# Patient Record
Sex: Female | Born: 1970
Health system: Southern US, Community
[De-identification: ages and names within clinical notes are randomized; demographics above are authoritative.]

## PROBLEM LIST (undated history)

## (undated) DIAGNOSIS — M4316 Spondylolisthesis, lumbar region: Secondary | ICD-10-CM

## (undated) DIAGNOSIS — T7840XA Allergy, unspecified, initial encounter: Secondary | ICD-10-CM

## (undated) DIAGNOSIS — J189 Pneumonia, unspecified organism: Secondary | ICD-10-CM

## (undated) DIAGNOSIS — E559 Vitamin D deficiency, unspecified: Secondary | ICD-10-CM

## (undated) DIAGNOSIS — E669 Obesity, unspecified: Secondary | ICD-10-CM

## (undated) DIAGNOSIS — K829 Disease of gallbladder, unspecified: Secondary | ICD-10-CM

## (undated) DIAGNOSIS — G4733 Obstructive sleep apnea (adult) (pediatric): Secondary | ICD-10-CM

## (undated) DIAGNOSIS — M1990 Unspecified osteoarthritis, unspecified site: Secondary | ICD-10-CM

## (undated) DIAGNOSIS — K219 Gastro-esophageal reflux disease without esophagitis: Secondary | ICD-10-CM

## (undated) DIAGNOSIS — I10 Essential (primary) hypertension: Secondary | ICD-10-CM

## (undated) DIAGNOSIS — F329 Major depressive disorder, single episode, unspecified: Secondary | ICD-10-CM

## (undated) DIAGNOSIS — F32A Depression, unspecified: Secondary | ICD-10-CM

## (undated) DIAGNOSIS — F419 Anxiety disorder, unspecified: Secondary | ICD-10-CM

## (undated) DIAGNOSIS — G43909 Migraine, unspecified, not intractable, without status migrainosus: Secondary | ICD-10-CM

## (undated) DIAGNOSIS — G4730 Sleep apnea, unspecified: Secondary | ICD-10-CM

## (undated) HISTORY — DX: Allergy, unspecified, initial encounter: T78.40XA

## (undated) HISTORY — PX: CHOLECYSTECTOMY: SHX55

## (undated) HISTORY — DX: Anxiety disorder, unspecified: F41.9

## (undated) HISTORY — DX: Obstructive sleep apnea (adult) (pediatric): G47.33

## (undated) HISTORY — DX: Unspecified osteoarthritis, unspecified site: M19.90

## (undated) HISTORY — DX: Essential (primary) hypertension: I10

## (undated) HISTORY — DX: Vitamin D deficiency, unspecified: E55.9

## (undated) HISTORY — DX: Sleep apnea, unspecified: G47.30

## (undated) HISTORY — PX: SLEEVE GASTROPLASTY: SHX1101

## (undated) HISTORY — DX: Depression, unspecified: F32.A

## (undated) HISTORY — PX: TUBAL LIGATION: SHX77

## (undated) HISTORY — DX: Gastro-esophageal reflux disease without esophagitis: K21.9

## (undated) HISTORY — DX: Obesity, unspecified: E66.9

## (undated) HISTORY — DX: Major depressive disorder, single episode, unspecified: F32.9

## (undated) HISTORY — DX: Disease of gallbladder, unspecified: K82.9

---

## 1998-01-28 ENCOUNTER — Inpatient Hospital Stay (HOSPITAL_COMMUNITY): Admission: AD | Admit: 1998-01-28 | Discharge: 1998-01-28 | Payer: Self-pay | Admitting: Obstetrics

## 1998-02-24 ENCOUNTER — Inpatient Hospital Stay (HOSPITAL_COMMUNITY): Admission: AD | Admit: 1998-02-24 | Discharge: 1998-02-24 | Payer: Self-pay | Admitting: Obstetrics

## 1998-03-11 ENCOUNTER — Observation Stay (HOSPITAL_COMMUNITY): Admission: AD | Admit: 1998-03-11 | Discharge: 1998-03-11 | Payer: Self-pay | Admitting: Obstetrics

## 1998-03-20 ENCOUNTER — Inpatient Hospital Stay (HOSPITAL_COMMUNITY): Admission: AD | Admit: 1998-03-20 | Discharge: 1998-03-20 | Payer: Self-pay | Admitting: Obstetrics

## 1998-03-21 ENCOUNTER — Inpatient Hospital Stay (HOSPITAL_COMMUNITY): Admission: AD | Admit: 1998-03-21 | Discharge: 1998-03-23 | Payer: Self-pay | Admitting: Obstetrics

## 1999-02-15 ENCOUNTER — Encounter: Payer: Self-pay | Admitting: Emergency Medicine

## 1999-02-15 ENCOUNTER — Emergency Department (HOSPITAL_COMMUNITY): Admission: EM | Admit: 1999-02-15 | Discharge: 1999-02-15 | Payer: Self-pay | Admitting: Emergency Medicine

## 1999-06-28 ENCOUNTER — Emergency Department (HOSPITAL_COMMUNITY): Admission: EM | Admit: 1999-06-28 | Discharge: 1999-06-28 | Payer: Self-pay | Admitting: Emergency Medicine

## 1999-08-12 ENCOUNTER — Emergency Department (HOSPITAL_COMMUNITY): Admission: EM | Admit: 1999-08-12 | Discharge: 1999-08-12 | Payer: Self-pay | Admitting: *Deleted

## 2000-12-29 ENCOUNTER — Other Ambulatory Visit: Admission: RE | Admit: 2000-12-29 | Discharge: 2000-12-29 | Payer: Self-pay | Admitting: Obstetrics

## 2001-01-26 ENCOUNTER — Encounter (HOSPITAL_BASED_OUTPATIENT_CLINIC_OR_DEPARTMENT_OTHER): Payer: Self-pay | Admitting: General Surgery

## 2001-01-28 ENCOUNTER — Ambulatory Visit (HOSPITAL_COMMUNITY): Admission: RE | Admit: 2001-01-28 | Discharge: 2001-01-29 | Payer: Self-pay | Admitting: General Surgery

## 2001-01-28 ENCOUNTER — Encounter (HOSPITAL_BASED_OUTPATIENT_CLINIC_OR_DEPARTMENT_OTHER): Payer: Self-pay | Admitting: General Surgery

## 2001-01-28 ENCOUNTER — Encounter (INDEPENDENT_AMBULATORY_CARE_PROVIDER_SITE_OTHER): Payer: Self-pay | Admitting: *Deleted

## 2001-08-12 ENCOUNTER — Emergency Department (HOSPITAL_COMMUNITY): Admission: EM | Admit: 2001-08-12 | Discharge: 2001-08-12 | Payer: Self-pay | Admitting: *Deleted

## 2004-03-06 ENCOUNTER — Emergency Department (HOSPITAL_COMMUNITY): Admission: EM | Admit: 2004-03-06 | Discharge: 2004-03-07 | Payer: Self-pay | Admitting: Emergency Medicine

## 2005-03-26 ENCOUNTER — Emergency Department (HOSPITAL_COMMUNITY): Admission: EM | Admit: 2005-03-26 | Discharge: 2005-03-26 | Payer: Self-pay | Admitting: Emergency Medicine

## 2007-04-11 ENCOUNTER — Emergency Department (HOSPITAL_COMMUNITY): Admission: EM | Admit: 2007-04-11 | Discharge: 2007-04-11 | Payer: Self-pay | Admitting: Emergency Medicine

## 2007-05-01 ENCOUNTER — Emergency Department (HOSPITAL_COMMUNITY): Admission: EM | Admit: 2007-05-01 | Discharge: 2007-05-01 | Payer: Self-pay | Admitting: Emergency Medicine

## 2007-05-13 ENCOUNTER — Emergency Department (HOSPITAL_COMMUNITY): Admission: EM | Admit: 2007-05-13 | Discharge: 2007-05-13 | Payer: Self-pay | Admitting: Emergency Medicine

## 2007-09-15 ENCOUNTER — Emergency Department (HOSPITAL_COMMUNITY): Admission: EM | Admit: 2007-09-15 | Discharge: 2007-09-16 | Payer: Self-pay | Admitting: Emergency Medicine

## 2007-09-22 ENCOUNTER — Encounter: Admission: RE | Admit: 2007-09-22 | Discharge: 2007-12-21 | Payer: Self-pay | Admitting: Sports Medicine

## 2007-12-08 ENCOUNTER — Encounter: Admission: RE | Admit: 2007-12-08 | Discharge: 2007-12-08 | Payer: Self-pay | Admitting: Sports Medicine

## 2008-01-07 ENCOUNTER — Encounter: Admission: RE | Admit: 2008-01-07 | Discharge: 2008-01-07 | Payer: Self-pay | Admitting: Sports Medicine

## 2008-04-22 ENCOUNTER — Emergency Department (HOSPITAL_COMMUNITY): Admission: EM | Admit: 2008-04-22 | Discharge: 2008-04-22 | Payer: Self-pay | Admitting: Emergency Medicine

## 2008-08-15 ENCOUNTER — Emergency Department (HOSPITAL_COMMUNITY): Admission: EM | Admit: 2008-08-15 | Discharge: 2008-08-15 | Payer: Self-pay | Admitting: Emergency Medicine

## 2008-12-19 ENCOUNTER — Ambulatory Visit (HOSPITAL_BASED_OUTPATIENT_CLINIC_OR_DEPARTMENT_OTHER): Admission: RE | Admit: 2008-12-19 | Discharge: 2008-12-19 | Payer: Self-pay | Admitting: Dentistry

## 2008-12-23 ENCOUNTER — Ambulatory Visit: Payer: Self-pay | Admitting: Internal Medicine

## 2009-03-02 ENCOUNTER — Emergency Department (HOSPITAL_BASED_OUTPATIENT_CLINIC_OR_DEPARTMENT_OTHER): Admission: EM | Admit: 2009-03-02 | Discharge: 2009-03-02 | Payer: Self-pay | Admitting: Emergency Medicine

## 2011-04-15 NOTE — Procedures (Signed)
NAME:  Kim Watson, Kim Watson                ACCOUNT NO.:  [id number]   MEDICAL RECORD NO.:  [id number]          PATIENT TYPE:  OUT   LOCATION:  SLEEP CENTER                 FACILITY:  [facility]   PHYSICIAN:  Clinton D. Maple Hudson, MD, FCCP, FACPDATE OF BIRTH:  March 06, 1971   DATE OF STUDY:  12/19/2008                            NOCTURNAL POLYSOMNOGRAM   REFERRING PHYSICIAN:   INDICATION FOR STUDY:  Hypersomnia with sleep apnea.   EPWORTH SLEEPINESS SCORE:  17/24.  BMI 45.  Weight 280 pounds, height 66  inches, neck 16 inches.   MEDICATIONS:  None listed.   SLEEP ARCHITECTURE:  Split study protocol.  During the diagnostic phase  total sleep time was 194 minutes with sleep efficiency 77.5%.  Stage I  was 6.4%.  Stage II 77.4%.  Stage III 7.7%.  REM 8.5% of total sleep  time.  Sleep latency 51 minutes.  REM latency 94 minutes.  Awake after  sleep onset 5.5 minutes.  Arousal index 43.8 indicating increased EEG  arousal.  No bedtime medication was taken.   RESPIRATORY DATA:  Split study protocol.  Apnea-hypopnea index (AHI)  15.1 per hour.  A total of 49 events was scored, all as hypopneas.  Events were predominantly while supine.  REM AHI 7.3 per hour.  CPAP was  titrated to 6 CWP, AHI 0 per hour.  She chose a small ResMed Mirage  Quattro mask with heated humidifier.   OXYGEN DATA:  Moderate snoring with oxygen desaturation to a nadir of  87%.  Mean oxygen saturation after CPAP control was 97.1% on room air.   CARDIAC DATA:  Normal sinus rhythm.   MOVEMENT-PARASOMNIA:  No significant movement disturbance.  No bathroom  trips.   IMPRESSIONS-RECOMMENDATIONS:  1. Mild to moderate obstructive sleep apnea/hypopnea syndrome, AHI      15.1 per hour.  Events were all hypopneas, recorded mostly while      supine, with moderate snoring and oxygen desaturation to a nadir of      87%.  2. Successful CPAP titration to 6 CWP, AHI 0 per hour.  She chose a      small ResMed Mirage Quattro mask with heated  humidifier.  3. Excessive limb movement activity meeting criteria for periodic limb      movement was not seen; however, review of the video tape shows      frequent limb movement, which suggests that this patient may at      times experience sleep disturbance due to periodic limb      movement/restless legs.  If clinical history suggests that      pattern to be important as a cause of home sleep disruption then a      therapeutic trial of Requip or Mirapex might be worth considering.      Clinton D. Maple Hudson, MD, [facility], FACP  Diplomate, Biomedical engineer of Sleep Medicine  Electronically Signed     CDY/MEDQ  D:  12/23/2008 12:23:14  T:  12/24/2008 06:03:55  Job:  [id number]

## 2011-04-18 NOTE — Op Note (Signed)
[facility]  Patient:    Kim Watson, Kim Watson                         MRN: [id number] Proc. Date: 01/28/01 Adm. Date:  [id number] Attending:  Sonda Primes                           Operative Report  PREOPERATIVE DIAGNOSIS:  Chronic calculous cholecystitis.  POSTOPERATIVE DIAGNOSIS:  Chronic calculous cholecystitis.  PROCEDURE:  Laparoscopic cholecystectomy with intraoperative cholangiogram.  SURGEON:  Luisa Hart L. Lurene Shadow, M.D.  ASSISTANT:  Marnee Spring. Wiliam Ke, M.D.  ANESTHESIA:  General.  CLINICAL NOTE:  Patient is a 40 year old woman presenting with upper abdominal pain, nausea and vomiting, normal liver functions, no hyperamylasemia, gallbladder ultrasound showing cholelithiasis.  Brought to the operating room now for cholecystectomy.  DESCRIPTION OF PROCEDURE:  Following the induction of satisfactory general anesthesia, the patient was positioned supinely and the abdomen prepped and draped routinely.  Open laparoscopy of the umbilicus and the insertion of a Hasson cannula allowed insufflation of the peritoneal cavity to 14 mmHg pressure.  The camera was inserted and the abdomen visually explored.  Liver edges were sharp, liver surface was smooth.  The gallbladder was chronically scarred.  Anterior gastric wall appeared to be within normal limits.  None of the small or large intestine viewed appeared to be abnormal.  Under direct vision, epigastric and lateral ports were placed and gallbladder was grasped and retracted cephalad and dissection carried down near the ampulla with isolation of the cystic artery and the cystic duct.  The cystic artery was doubly clipped and transected.  The cystic duct was clipped proximally and opened.  A cystic duct cholangiogram was carried out using one-half strength Hypaque dye.  The resulting cholangiogram showed prompt and free flow through the cystic duct into the biliary system and into the duodenum.  No  filling defects were noted.  No biliary dilatation was noted. The cholangiocatheter was then removed, and the cystic duct was doubly clipped and transected.  The gallbladder was then dissected free from the liver bed, maintaining hemostasis along the course using electrocautery.  The liver bed was again inspected and noted to be dry.  The right upper quadrant was thoroughly irrigated with normal saline and aspirated.  Camera removed through the epigastric port and the gallbladder retrieved through the umbilical port without difficulty.  Sponge, instrument, and sharp counts verified, pneumoperitoneum deflated, and the wounds closed in layers as follows: Umbilical wound in two layers with 0 Dexon and 4-0 Dexon.  Epigastric and lateral flank wound with 4-0 Dexon.  All the wounds reinforced with Steri-Strips, and sterile dressings were applied.  Anesthetic reversed, patient removed from the operating room to the recovery room in stable condition, having tolerated the procedure well. DD:  01/28/01 TD:  01/28/01 Job: [id number]

## 2011-07-08 ENCOUNTER — Emergency Department (HOSPITAL_COMMUNITY)
Admission: EM | Admit: 2011-07-08 | Discharge: 2011-07-08 | Disposition: A | Discharge status: Home / Self Care | Payer: Medicaid Other | Attending: Emergency Medicine | Admitting: Emergency Medicine

## 2011-07-08 ENCOUNTER — Emergency Department (HOSPITAL_COMMUNITY): Payer: Medicaid Other

## 2011-07-08 DIAGNOSIS — I10 Essential (primary) hypertension: Secondary | ICD-10-CM | POA: Insufficient documentation

## 2011-07-08 DIAGNOSIS — R109 Unspecified abdominal pain: Secondary | ICD-10-CM | POA: Insufficient documentation

## 2011-07-08 DIAGNOSIS — R0602 Shortness of breath: Secondary | ICD-10-CM | POA: Insufficient documentation

## 2011-07-08 DIAGNOSIS — R51 Headache: Secondary | ICD-10-CM | POA: Insufficient documentation

## 2011-07-08 DIAGNOSIS — Z8614 Personal history of Methicillin resistant Staphylococcus aureus infection: Secondary | ICD-10-CM | POA: Insufficient documentation

## 2011-07-08 DIAGNOSIS — R10816 Epigastric abdominal tenderness: Secondary | ICD-10-CM | POA: Insufficient documentation

## 2011-07-08 DIAGNOSIS — R112 Nausea with vomiting, unspecified: Secondary | ICD-10-CM | POA: Insufficient documentation

## 2011-07-08 LAB — CBC
MCH: 27.1 pg (ref 26.0–34.0)
MCHC: 31.6 g/dL (ref 30.0–36.0)
MCV: 85.8 fL (ref 78.0–100.0)
Platelets: 286 10*3/uL (ref 150–400)
RBC: 5.01 MIL/uL (ref 3.87–5.11)
RDW: 14.2 % (ref 11.5–15.5)

## 2011-07-08 LAB — DIFFERENTIAL
Basophils Relative: 0 % (ref 0–1)
Eosinophils Absolute: 0.3 10*3/uL (ref 0.0–0.7)
Eosinophils Relative: 4 % (ref 0–5)
Monocytes Absolute: 0.5 10*3/uL (ref 0.1–1.0)
Monocytes Relative: 7 % (ref 3–12)
Neutrophils Relative %: 54 % (ref 43–77)

## 2011-07-08 LAB — URINALYSIS, ROUTINE W REFLEX MICROSCOPIC
Glucose, UA: NEGATIVE mg/dL
Ketones, ur: NEGATIVE mg/dL
Nitrite: NEGATIVE
pH: 6 (ref 5.0–8.0)

## 2011-07-08 LAB — COMPREHENSIVE METABOLIC PANEL
ALT: 19 U/L (ref 0–35)
AST: 17 U/L (ref 0–37)
Albumin: 3.8 g/dL (ref 3.5–5.2)
Calcium: 9.4 mg/dL (ref 8.4–10.5)
Creatinine, Ser: 0.66 mg/dL (ref 0.50–1.10)
Sodium: 139 mEq/L (ref 135–145)
Total Protein: 7.6 g/dL (ref 6.0–8.3)

## 2011-07-08 LAB — URINE MICROSCOPIC-ADD ON

## 2011-07-08 LAB — POCT PREGNANCY, URINE: Preg Test, Ur: NEGATIVE

## 2011-09-18 LAB — CBC
HCT: 42.3
Hemoglobin: 14.5
MCHC: 34.3
Platelets: 255
RDW: 12.9

## 2011-09-18 LAB — BASIC METABOLIC PANEL
BUN: 11
CO2: 28
Calcium: 9.4
GFR calc non Af Amer: >60
Glucose, Bld: 98
Potassium: 3.8
Sodium: 138

## 2011-09-18 LAB — DIFFERENTIAL
Basophils Absolute: 0.2 — ABNORMAL HIGH
Basophils Relative: 2 — ABNORMAL HIGH
Eosinophils Absolute: 0.4
Eosinophils Relative: 4
Monocytes Absolute: 0.7

## 2011-10-29 ENCOUNTER — Encounter: Payer: Self-pay | Admitting: *Deleted

## 2011-10-29 ENCOUNTER — Emergency Department (HOSPITAL_COMMUNITY): Payer: Self-pay

## 2011-10-29 ENCOUNTER — Emergency Department (HOSPITAL_COMMUNITY)
Admission: EM | Admit: 2011-10-29 | Discharge: 2011-10-29 | Disposition: A | Discharge status: Home / Self Care | Payer: Self-pay | Attending: Emergency Medicine | Admitting: Emergency Medicine

## 2011-10-29 DIAGNOSIS — J189 Pneumonia, unspecified organism: Secondary | ICD-10-CM | POA: Insufficient documentation

## 2011-10-29 DIAGNOSIS — R059 Cough, unspecified: Secondary | ICD-10-CM | POA: Insufficient documentation

## 2011-10-29 DIAGNOSIS — I10 Essential (primary) hypertension: Secondary | ICD-10-CM | POA: Insufficient documentation

## 2011-10-29 DIAGNOSIS — R05 Cough: Secondary | ICD-10-CM | POA: Insufficient documentation

## 2011-10-29 HISTORY — DX: Essential (primary) hypertension: I10

## 2011-10-29 MED ORDER — ALBUTEROL SULFATE HFA 108 (90 BASE) MCG/ACT IN AERS: 2.0000 | INHALATION_SPRAY | RESPIRATORY_TRACT | Status: DC | PRN

## 2011-10-29 MED ORDER — IPRATROPIUM BROMIDE 0.02 % IN SOLN
0.5000 mg | Freq: Once | RESPIRATORY_TRACT | Status: AC
  Administered 2011-10-29: 0.5 mg via RESPIRATORY_TRACT
  Filled 2011-10-29: qty 2.5

## 2011-10-29 MED ORDER — AZITHROMYCIN 250 MG PO TABS: 250.0000 mg | ORAL_TABLET | ORAL | Status: AC

## 2011-10-29 MED ORDER — HYDROCOD POLST-CHLORPHEN POLST 10-8 MG/5ML PO LQCR: 5.0000 mL | Freq: Two times a day (BID) | ORAL | Status: DC | PRN

## 2011-10-29 MED ORDER — IBUPROFEN 800 MG PO TABS
800.0000 mg | ORAL_TABLET | Freq: Once | ORAL | Status: AC
  Administered 2011-10-29: 800 mg via ORAL
  Filled 2011-10-29: qty 1

## 2011-10-29 MED ORDER — ALBUTEROL SULFATE (5 MG/ML) 0.5% IN NEBU
5.0000 mg | INHALATION_SOLUTION | Freq: Once | RESPIRATORY_TRACT | Status: AC
Start: 2011-10-29 — End: 2011-10-29
  Administered 2011-10-29: 2.5 mg via RESPIRATORY_TRACT
  Filled 2011-10-29: qty 0.5

## 2011-10-29 MED ORDER — HYDROCOD POLST-CHLORPHEN POLST 10-8 MG/5ML PO LQCR
5.0000 mL | Freq: Once | ORAL | Status: AC
  Administered 2011-10-29: 5 mL via ORAL
  Filled 2011-10-29: qty 5

## 2011-10-29 NOTE — ED Notes (Signed)
Pt states "I have a really bad h/a, cough, I taste blood, my chest hurts, my ears hurt" pt denies productive cough.; LS clear

## 2011-10-29 NOTE — ED Provider Notes (Signed)
History     CSN: [id number] Arrival date & time: 10/29/2011  6:06 PM   First MD Initiated Contact with Patient 10/29/11 1950      Chief Complaint  Patient presents with  . URI    (Consider location/radiation/quality/duration/timing/severity/associated sxs/prior treatment) HPI Comments: Patient reports cough, chest congestion, dry throat, body aches, fevers that began yesterday.  Reports burning in chest with coughing and breathing. Denies nasal congestion or rhinorrhea, sore throat.    Patient is a 40 y.o. female presenting with URI. The history is provided by the patient.  URI    Past Medical History  Diagnosis Date  . Hypertension     Past Surgical History  Procedure Date  . Cholecystectomy   . Tubal ligation     No family history on file.  History  Substance Use Topics  . Smoking status: Never Smoker   . Smokeless tobacco: Not on file  . Alcohol Use: Yes     socially    OB History    Grav Para Term Preterm Abortions TAB SAB Ect Mult Living                  Review of Systems  All other systems reviewed and are negative.    Allergies  Percocet  Home Medications   Current Outpatient Rx  Name Route Sig Dispense Refill  . ACETAMINOPHEN 325 MG PO TABS Oral Take 650 mg by mouth every 6 (six) hours as needed.      Marland Kitchen DEXTROMETHORPHAN-GUAIFENESIN 10-100 MG/5ML PO LIQD Oral Take 5 mLs by mouth every 4 (four) hours as needed.        BP 129/87  Pulse 97  Temp(Src) 101.3 F (38.5 C) (Oral)  Resp 18  Wt 277 lb (125.646 kg)  SpO2 97%  LMP 09/20/2011  Physical Exam  Constitutional: She is oriented to person, place, and time. She appears well-developed and well-nourished.  HENT:  Head: Normocephalic and atraumatic.  Neck: Neck supple.  Cardiovascular: Normal rate, regular rhythm and normal heart sounds.   Pulmonary/Chest: Effort normal and breath sounds normal. No respiratory distress. She has no wheezes. She has no rales. She exhibits no tenderness.         +cough, shallow breathing.   Abdominal: Soft. Bowel sounds are normal. There is no tenderness.  Neurological: She is alert and oriented to person, place, and time.    ED Course  Procedures (including critical care time)  Labs Reviewed - No data to display Dg Chest 2 View  10/29/2011  *RADIOLOGY REPORT*  Clinical Data: Cough, fever, chest pain  CHEST - 2 VIEW  Comparison: 05/01/2007  Findings: Subtle left lower lobe costophrenic angle airspace opacity is present.  No pleural effusion.  Heart size is normal. Right lung is clear.  No acute osseous finding.  IMPRESSION: Minimal left lower lobe pulmonary parenchymal opacity which could represent early pneumonia given the clinical history, less likely atelectasis.  Original Report Authenticated By: Harrel Lemon, M.D.     1. CAP (community acquired pneumonia)       MDM  Pt with cough, fever, SOB that began yesterday, CXR shows pneumonia.          Dillard Cannon [place], [place] 10/29/11 [phone]

## 2011-10-30 NOTE — ED Provider Notes (Signed)
Medical screening examination/treatment/procedure(s) were performed by non-physician practitioner and as supervising physician I was immediately available for consultation/collaboration.   Arline Ketter, MD 10/30/11 0653 

## 2013-12-02 ENCOUNTER — Emergency Department (HOSPITAL_COMMUNITY)
Admission: EM | Admit: 2013-12-02 | Discharge: 2013-12-02 | Disposition: A | Discharge status: Home / Self Care | Payer: Self-pay | Attending: Emergency Medicine | Admitting: Emergency Medicine

## 2013-12-02 ENCOUNTER — Emergency Department (HOSPITAL_COMMUNITY): Payer: Self-pay

## 2013-12-02 ENCOUNTER — Encounter (HOSPITAL_COMMUNITY): Payer: Self-pay | Admitting: Emergency Medicine

## 2013-12-02 DIAGNOSIS — Z79899 Other long term (current) drug therapy: Secondary | ICD-10-CM | POA: Insufficient documentation

## 2013-12-02 DIAGNOSIS — J069 Acute upper respiratory infection, unspecified: Secondary | ICD-10-CM | POA: Insufficient documentation

## 2013-12-02 DIAGNOSIS — J029 Acute pharyngitis, unspecified: Secondary | ICD-10-CM | POA: Insufficient documentation

## 2013-12-02 DIAGNOSIS — R51 Headache: Secondary | ICD-10-CM | POA: Insufficient documentation

## 2013-12-02 DIAGNOSIS — I10 Essential (primary) hypertension: Secondary | ICD-10-CM | POA: Insufficient documentation

## 2013-12-02 MED ORDER — AZITHROMYCIN 250 MG PO TABS: 250.0000 mg | ORAL_TABLET | Freq: Every day | ORAL | Status: DC

## 2013-12-02 MED ORDER — BENZONATATE 100 MG PO CAPS: 100.0000 mg | ORAL_CAPSULE | Freq: Three times a day (TID) | ORAL | Status: DC

## 2013-12-02 NOTE — ED Provider Notes (Signed)
Medical screening examination/treatment/procedure(s) were performed by non-physician practitioner and as supervising physician I was immediately available for consultation/collaboration.  EKG Interpretation   None         Kim ChurnJohn David Shivan Hodes, MD 12/02/13 1711

## 2013-12-02 NOTE — ED Notes (Signed)
Pt reports sinus pressure, productive cough, nasal drainage-blood tinged, facial pressure x 18 hrs

## 2013-12-02 NOTE — ED Provider Notes (Signed)
CSN: [id number]     Arrival date & time 12/02/13  1143 History  This chart was scribed for non-physician practitioner, Jovita Kussmauliffany Idolina Mantell-PA, working with Candyce ChurnJohn David Wofford, MD by Smiley HousemanFallon Davis, ED Scribe. This patient was seen in room WTR9/WTR9 and the patient's care was started at 1:17 PM.  Chief Complaint  Patient presents with  . Cough  . Facial Pain    x 18 hrs  . Sore Throat    x 18 hrs   The history is provided by the patient. No language interpreter was used.   HPI Comments: Onalee HuaCarol E Gammell is a 43 y.o. female who presents to the Emergency Department complaining of a persistent non productive cough, with associated nasal congestion and sinus pressure that started about 3 days ago.  She states she developed the nasal congestion about 2 days ago, when she was taking care of her granddaughter who was sick.  She states the cough is worse during the night.  She denies chills and fever.  ED temperature is 98.66F.  Pt states both of her grandchildren have been on antibiotics for similar symptoms.    Past Medical History  Diagnosis Date  . Hypertension    Past Surgical History  Procedure Laterality Date  . Cholecystectomy    . Tubal ligation     Family History  Problem Relation Age of Onset  . Diabetes Other   . Hyperlipidemia Other   . Hypertension Other    History  Substance Use Topics  . Smoking status: Never Smoker   . Smokeless tobacco: Not on file  . Alcohol Use: Yes     Comment: socially   OB History   Grav Para Term Preterm Abortions TAB SAB Ect Mult Living                 Review of Systems  Constitutional: Negative for fever and chills.  HENT: Positive for congestion (Nasal). Negative for ear pain and rhinorrhea.   Respiratory: Positive for cough. Negative for shortness of breath.   Cardiovascular: Negative for chest pain.  Gastrointestinal: Negative for nausea, vomiting, abdominal pain and diarrhea.  Musculoskeletal: Negative for back pain.  Skin: Negative for  color change and rash.  Neurological: Negative for syncope.  All other systems reviewed and are negative.    Allergies  Percocet  Home Medications   Current Outpatient Rx  Name  Route  Sig  Dispense  Refill  . acetaminophen (TYLENOL) 325 MG tablet   Oral   Take 650 mg by mouth every 6 (six) hours as needed.           Marland Kitchen. ibuprofen (ADVIL,MOTRIN) 200 MG tablet   Oral   Take 600 mg by mouth every 6 (six) hours as needed.         Marland Kitchen. EXPIRED: albuterol (PROVENTIL HFA;VENTOLIN HFA) 108 (90 BASE) MCG/ACT inhaler   Inhalation   Inhale 2 puffs into the lungs every 4 (four) hours as needed for wheezing or shortness of breath.   1 Inhaler   0   . azithromycin (ZITHROMAX) 250 MG tablet   Oral   Take 1 tablet (250 mg total) by mouth daily. Take first 2 tablets together, then 1 every day until finished.   6 tablet   0   . benzonatate (TESSALON) 100 MG capsule   Oral   Take 1 capsule (100 mg total) by mouth every 8 (eight) hours.   21 capsule   0    Triage Vitals: BP 133/87  Pulse  73  Temp(Src) 98.4 F (36.9 C) (Oral)  Resp 20  Wt 270 lb (122.471 kg)  SpO2 100%  LMP 10/16/2013 Physical Exam  Nursing note and vitals reviewed. Constitutional: She is oriented to person, place, and time. She appears well-developed and well-nourished. No distress.  HENT:  Head: Normocephalic and atraumatic.  Right Ear: External ear normal.  Left Ear: External ear normal.  Nose: Nose normal.  Mouth/Throat: Oropharynx is clear and moist.  Tonsil enlarged and mildly erythematous.   Eyes: Conjunctivae and EOM are normal.  Neck: Neck supple. No tracheal deviation present.  Cardiovascular: Normal rate, regular rhythm and normal heart sounds.   No murmur heard. Pulmonary/Chest: Effort normal. No respiratory distress. She has wheezes (small amount).  Musculoskeletal: Normal range of motion.  Neurological: She is alert and oriented to person, place, and time.  Skin: Skin is warm and dry.   Psychiatric: She has a normal mood and affect. Her behavior is normal.    ED Course  Procedures (including critical care time) DIAGNOSTIC STUDIES: Oxygen Saturation is 100% on RA, normal by my interpretation.    COORDINATION OF CARE: 1:20 PM-Discussed the normal results of her chest X-ray.  Will discharge with cough medicine and Zithromax. Patient informed of current plan of treatment and evaluation and agrees with plan.    Imaging Review Dg Chest 2 View  12/02/2013   CLINICAL DATA:  Cough and congestion.  Sore throat.  EXAM: CHEST  2 VIEW  COMPARISON:  10/29/2011  FINDINGS: Heart size is normal. Mediastinal shadows are normal. Lungs are clear. No effusions. No significant bony finding.  IMPRESSION: Normal chest   Electronically Signed   By: Paulina Fusi M.D.   On: 12/02/2013 13:04    EKG Interpretation   None       MDM   1. URI (upper respiratory infection)    43 y.o.Harold Hedge Sweezy's evaluation in the Emergency Department is complete. It has been determined that no acute conditions requiring further emergency intervention are present at this time. The patient/guardian have been advised of the diagnosis and plan. We have discussed signs and symptoms that warrant return to the ED, such as changes or worsening in symptoms.  Vital signs are stable at discharge. Filed Vitals:   12/02/13 1218  BP: 133/87  Pulse: 73  Temp: 98.4 F (36.9 C)  Resp: 20    Patient/guardian has voiced understanding and agreed to follow-up with the PCP or specialist.  I personally performed the services described in this documentation, which was scribed in my presence. The recorded information has been reviewed and is accurate.   Dorthula Matas, PA-C 12/02/13 1332

## 2013-12-02 NOTE — Discharge Instructions (Signed)

## 2014-01-30 DIAGNOSIS — G4733 Obstructive sleep apnea (adult) (pediatric): Secondary | ICD-10-CM | POA: Insufficient documentation

## 2014-09-20 ENCOUNTER — Emergency Department (HOSPITAL_BASED_OUTPATIENT_CLINIC_OR_DEPARTMENT_OTHER)
Admission: EM | Admit: 2014-09-20 | Discharge: 2014-09-20 | Disposition: A | Discharge status: Home / Self Care | Payer: Medicaid Other | Attending: Emergency Medicine | Admitting: Emergency Medicine

## 2014-09-20 ENCOUNTER — Encounter (HOSPITAL_BASED_OUTPATIENT_CLINIC_OR_DEPARTMENT_OTHER): Payer: Self-pay | Admitting: Emergency Medicine

## 2014-09-20 DIAGNOSIS — R6883 Chills (without fever): Secondary | ICD-10-CM | POA: Insufficient documentation

## 2014-09-20 DIAGNOSIS — Z791 Long term (current) use of non-steroidal anti-inflammatories (NSAID): Secondary | ICD-10-CM | POA: Insufficient documentation

## 2014-09-20 DIAGNOSIS — R112 Nausea with vomiting, unspecified: Secondary | ICD-10-CM | POA: Insufficient documentation

## 2014-09-20 DIAGNOSIS — Z792 Long term (current) use of antibiotics: Secondary | ICD-10-CM | POA: Insufficient documentation

## 2014-09-20 DIAGNOSIS — R6889 Other general symptoms and signs: Secondary | ICD-10-CM

## 2014-09-20 DIAGNOSIS — R42 Dizziness and giddiness: Secondary | ICD-10-CM | POA: Insufficient documentation

## 2014-09-20 DIAGNOSIS — M791 Myalgia: Secondary | ICD-10-CM | POA: Insufficient documentation

## 2014-09-20 DIAGNOSIS — I10 Essential (primary) hypertension: Secondary | ICD-10-CM | POA: Insufficient documentation

## 2014-09-20 DIAGNOSIS — Z79899 Other long term (current) drug therapy: Secondary | ICD-10-CM | POA: Insufficient documentation

## 2014-09-20 DIAGNOSIS — Z3202 Encounter for pregnancy test, result negative: Secondary | ICD-10-CM | POA: Insufficient documentation

## 2014-09-20 DIAGNOSIS — R51 Headache: Secondary | ICD-10-CM | POA: Insufficient documentation

## 2014-09-20 DIAGNOSIS — R197 Diarrhea, unspecified: Secondary | ICD-10-CM | POA: Insufficient documentation

## 2014-09-20 LAB — CBC WITH DIFFERENTIAL/PLATELET
Basophils Absolute: 0 10*3/uL (ref 0.0–0.1)
Basophils Relative: 0 % (ref 0–1)
EOS PCT: 1 % (ref 0–5)
Eosinophils Absolute: 0.1 10*3/uL (ref 0.0–0.7)
HEMATOCRIT: 41.6 % (ref 36.0–46.0)
Hemoglobin: 13.7 g/dL (ref 12.0–15.0)
LYMPHS PCT: 16 % (ref 12–46)
Lymphs Abs: 1.7 10*3/uL (ref 0.7–4.0)
MCH: 29 pg (ref 26.0–34.0)
MCHC: 32.9 g/dL (ref 30.0–36.0)
MCV: 88.1 fL (ref 78.0–100.0)
MONO ABS: 0.8 10*3/uL (ref 0.1–1.0)
Monocytes Relative: 8 % (ref 3–12)
Neutro Abs: 7.9 10*3/uL — ABNORMAL HIGH (ref 1.7–7.7)
Neutrophils Relative %: 75 % (ref 43–77)
Platelets: 216 10*3/uL (ref 150–400)
RBC: 4.72 MIL/uL (ref 3.87–5.11)
RDW: 13.2 % (ref 11.5–15.5)
WBC: 10.5 10*3/uL (ref 4.0–10.5)

## 2014-09-20 LAB — BASIC METABOLIC PANEL
Anion gap: 12 (ref 5–15)
BUN: 12 mg/dL (ref 6–23)
CALCIUM: 9.5 mg/dL (ref 8.4–10.5)
CHLORIDE: 98 meq/L (ref 96–112)
CO2: 29 mEq/L (ref 19–32)
Creatinine, Ser: 0.8 mg/dL (ref 0.50–1.10)
GFR calc Af Amer: >90 mL/min (ref >90)
GFR calc non Af Amer: 89 mL/min — ABNORMAL LOW (ref >90)
Glucose, Bld: 106 mg/dL — ABNORMAL HIGH (ref 70–99)
Potassium: 4.7 mEq/L (ref 3.7–5.3)
Sodium: 139 mEq/L (ref 137–147)

## 2014-09-20 LAB — URINALYSIS, ROUTINE W REFLEX MICROSCOPIC
Bilirubin Urine: NEGATIVE
GLUCOSE, UA: NEGATIVE mg/dL
HGB URINE DIPSTICK: NEGATIVE
Ketones, ur: NEGATIVE mg/dL
Leukocytes, UA: NEGATIVE
Nitrite: NEGATIVE
PH: 6.5 (ref 5.0–8.0)
Protein, ur: NEGATIVE mg/dL
SPECIFIC GRAVITY, URINE: 1.015 (ref 1.005–1.030)
Urobilinogen, UA: 0.2 mg/dL (ref 0.0–1.0)

## 2014-09-20 LAB — PREGNANCY, URINE: PREG TEST UR: NEGATIVE

## 2014-09-20 MED ORDER — IBUPROFEN 600 MG PO TABS: 600.0000 mg | ORAL_TABLET | Freq: Four times a day (QID) | ORAL | Status: DC | PRN

## 2014-09-20 MED ORDER — ONDANSETRON HCL 4 MG PO TABS: 4.0000 mg | ORAL_TABLET | Freq: Three times a day (TID) | ORAL | Status: DC | PRN

## 2014-09-20 MED ORDER — DIPHENHYDRAMINE HCL 50 MG/ML IJ SOLN
25.0000 mg | Freq: Once | INTRAMUSCULAR | Status: AC
  Administered 2014-09-20: 25 mg via INTRAVENOUS
  Filled 2014-09-20: qty 1

## 2014-09-20 MED ORDER — METOCLOPRAMIDE HCL 5 MG/ML IJ SOLN
5.0000 mg | Freq: Once | INTRAMUSCULAR | Status: AC
  Administered 2014-09-20: 5 mg via INTRAVENOUS
  Filled 2014-09-20: qty 2

## 2014-09-20 MED ORDER — ONDANSETRON HCL 4 MG/2ML IJ SOLN
4.0000 mg | Freq: Once | INTRAMUSCULAR | Status: AC
  Administered 2014-09-20: 4 mg via INTRAVENOUS
  Filled 2014-09-20: qty 2

## 2014-09-20 MED ORDER — KETOROLAC TROMETHAMINE 30 MG/ML IJ SOLN
30.0000 mg | Freq: Once | INTRAMUSCULAR | Status: AC
  Administered 2014-09-20: 30 mg via INTRAVENOUS
  Filled 2014-09-20: qty 1

## 2014-09-20 MED ORDER — SODIUM CHLORIDE 0.9 % IV BOLUS (SEPSIS)
1000.0000 mL | Freq: Once | INTRAVENOUS | Status: AC
  Administered 2014-09-20: 1000 mL via INTRAVENOUS

## 2014-09-20 NOTE — ED Notes (Addendum)
C/o vomiting started 10/16 with HA-states she is now having body aches/chills-also c/o dizziness-drove self here with co-worker followed her

## 2014-09-20 NOTE — ED Notes (Signed)
Pt states she is feeling better. H/a is still 8/10. Pt aware we are awaiting labs to come back. Family sitting with pt.

## 2014-09-20 NOTE — ED Provider Notes (Signed)
5:00 PM Care assumed from Dr. Wilkie AyeHorton.  Pt's headache has resolved.  She felt comfortable with discharge home.  DC paperwork and prescriptions per Dr. Wilkie AyeHorton.    Clinical Impression: 1. Flu-like symptoms       Warnell Foresterrey Brezlyn Manrique, MD 09/20/14 [phone]

## 2014-09-20 NOTE — Discharge Instructions (Signed)

## 2014-09-20 NOTE — ED Provider Notes (Signed)
CSN: [id number]     Arrival date & time 09/20/14  1356 History   First MD Initiated Contact with Patient 09/20/14 1403     Chief Complaint  Patient presents with  . Emesis     (Consider location/radiation/quality/duration/timing/severity/associated sxs/prior Treatment) HPI  This is a 43 year old female who presents with nausea, vomiting, diarrhea, headache, and chills. Patient reports onset of symptoms on Friday. Initially it was vomiting and diarrhea. She's had multiple sick contacts including both of her grandchildren. She reports diffuse myalgias and chills.  No objective fevers. Reports that her grandson has pinkeye and an ear infection. Patient reports that she's had persistent lightheadedness. She denies any room spinning dizziness. She also reports a headache which is 8/10. It is been ongoing for the last 5 days. Denies any neck stiffness.  Past Medical History  Diagnosis Date  . Hypertension    Past Surgical History  Procedure Laterality Date  . Cholecystectomy    . Tubal ligation     Family History  Problem Relation Age of Onset  . Diabetes Other   . Hyperlipidemia Other   . Hypertension Other    History  Substance Use Topics  . Smoking status: Never Smoker   . Smokeless tobacco: Not on file  . Alcohol Use: Yes     Comment: socially   OB History   Grav Para Term Preterm Abortions TAB SAB Ect Mult Living                 Review of Systems  Constitutional: Positive for chills. Negative for fever.  Respiratory: Negative for cough, chest tightness and shortness of breath.   Cardiovascular: Negative for chest pain.  Gastrointestinal: Positive for nausea, vomiting and diarrhea. Negative for abdominal pain.  Genitourinary: Negative for dysuria.  Musculoskeletal: Negative for back pain and neck stiffness.  Skin: Negative for wound.  Neurological: Positive for headaches.  Psychiatric/Behavioral: Negative for confusion.  All other systems reviewed and are  negative.     Allergies  Percocet  Home Medications   Prior to Admission medications   Medication Sig Start Date End Date Taking? Authorizing Provider  citalopram (CELEXA) 20 MG tablet Take 20 mg by mouth daily.   Yes Historical Provider, MD  clonazePAM (KLONOPIN) 0.5 MG tablet Take 0.5 mg by mouth 2 (two) times daily as needed for anxiety.   Yes Historical Provider, MD  Cyclobenzaprine HCl (FLEXERIL PO) Take by mouth.   Yes Historical Provider, MD  famotidine (PEPCID) 20 MG tablet Take 20 mg by mouth 2 (two) times daily.   Yes Historical Provider, MD  naproxen sodium (ANAPROX) 220 MG tablet Take 220 mg by mouth 2 (two) times daily with a meal.   Yes Historical Provider, MD  acetaminophen (TYLENOL) 325 MG tablet Take 650 mg by mouth every 6 (six) hours as needed.      Historical Provider, MD  albuterol (PROVENTIL HFA;VENTOLIN HFA) 108 (90 BASE) MCG/ACT inhaler Inhale 2 puffs into the lungs every 4 (four) hours as needed for wheezing or shortness of breath. 10/29/11 10/28/12  Trixie DredgeEmily West, PA-C  azithromycin (ZITHROMAX) 250 MG tablet Take 1 tablet (250 mg total) by mouth daily. Take first 2 tablets together, then 1 every day until finished. 12/02/13   Tiffany Irine SealG Greene, PA-C  benzonatate (TESSALON) 100 MG capsule Take 1 capsule (100 mg total) by mouth every 8 (eight) hours. 12/02/13   Tiffany Irine SealG Greene, PA-C  ibuprofen (ADVIL,MOTRIN) 200 MG tablet Take 600 mg by mouth every 6 (six) hours as  needed.    Historical Provider, MD  ibuprofen (ADVIL,MOTRIN) 600 MG tablet Take 1 tablet (600 mg total) by mouth every 6 (six) hours as needed. 09/20/14   Shon Batonourtney F Fareeda Downard, MD  ondansetron (ZOFRAN) 4 MG tablet Take 1 tablet (4 mg total) by mouth every 8 (eight) hours as needed for nausea or vomiting. 09/20/14   Shon Batonourtney F Ellory Khurana, MD   BP 108/61  Pulse 80  Temp(Src) 98.9 F (37.2 C) (Oral)  Resp 18  Ht 5\' 6"  (1.676 m)  Wt 271 lb (122.925 kg)  BMI 43.76 kg/m2  SpO2 100%  LMP 09/03/2014 Physical Exam   Nursing note and vitals reviewed. Constitutional: She is oriented to person, place, and time. She appears well-developed and well-nourished.  HENT:  Head: Normocephalic and atraumatic.  Mouth/Throat: No oropharyngeal exudate.  Mucous membranes dry  Eyes: EOM are normal. Pupils are equal, round, and reactive to light.  Neck: Normal range of motion. Neck supple.  Cardiovascular: Normal rate, regular rhythm and normal heart sounds.   No murmur heard. Pulmonary/Chest: Effort normal and breath sounds normal. No respiratory distress. She has no wheezes.  Abdominal: Soft. Bowel sounds are normal. There is no tenderness. There is no rebound.  Neurological: She is alert and oriented to person, place, and time.  Skin: Skin is warm and dry.  Psychiatric: She has a normal mood and affect.    ED Course  Procedures (including critical care time) Labs Review Labs Reviewed  URINALYSIS, ROUTINE W REFLEX MICROSCOPIC - Abnormal; Notable for the following:    APPearance CLOUDY (*)    All other components within normal limits  CBC WITH DIFFERENTIAL - Abnormal; Notable for the following:    Neutro Abs 7.9 (*)    All other components within normal limits  BASIC METABOLIC PANEL - Abnormal; Notable for the following:    Glucose, Bld 106 (*)    GFR calc non Af Amer 89 (*)    All other components within normal limits  PREGNANCY, URINE    Imaging Review No results found.   EKG Interpretation None      MDM   Final diagnoses:  Flu-like symptoms    Patient presents with flu-like symptoms.  Ill appearing but nontoxic.  Afebrile.  Not orthostatic.  NO evidence of meningismus.  Basic labs reassuring.  Patient given fluids and toradol with some improvement.  COntinues to report HA.  GIven benadryl and reglan.    Signed out to Dr. Loretha StaplerWofford.    Shon Batonourtney F Paizley Ramella, MD 09/21/14 2012

## 2014-10-04 DIAGNOSIS — M170 Bilateral primary osteoarthritis of knee: Secondary | ICD-10-CM | POA: Insufficient documentation

## 2014-11-02 DIAGNOSIS — G8929 Other chronic pain: Secondary | ICD-10-CM | POA: Insufficient documentation

## 2015-01-07 ENCOUNTER — Encounter (HOSPITAL_COMMUNITY): Payer: Self-pay | Admitting: Emergency Medicine

## 2015-01-07 ENCOUNTER — Emergency Department (HOSPITAL_COMMUNITY)
Admission: EM | Admit: 2015-01-07 | Discharge: 2015-01-07 | Disposition: A | Discharge status: Home / Self Care | Payer: BLUE CROSS/BLUE SHIELD | Attending: Emergency Medicine | Admitting: Emergency Medicine

## 2015-01-07 DIAGNOSIS — Z79899 Other long term (current) drug therapy: Secondary | ICD-10-CM | POA: Insufficient documentation

## 2015-01-07 DIAGNOSIS — R1013 Epigastric pain: Secondary | ICD-10-CM

## 2015-01-07 DIAGNOSIS — R42 Dizziness and giddiness: Secondary | ICD-10-CM

## 2015-01-07 DIAGNOSIS — R112 Nausea with vomiting, unspecified: Secondary | ICD-10-CM

## 2015-01-07 DIAGNOSIS — I10 Essential (primary) hypertension: Secondary | ICD-10-CM | POA: Diagnosis not present

## 2015-01-07 DIAGNOSIS — Z3202 Encounter for pregnancy test, result negative: Secondary | ICD-10-CM | POA: Insufficient documentation

## 2015-01-07 DIAGNOSIS — K2970 Gastritis, unspecified, without bleeding: Secondary | ICD-10-CM

## 2015-01-07 DIAGNOSIS — Z792 Long term (current) use of antibiotics: Secondary | ICD-10-CM | POA: Insufficient documentation

## 2015-01-07 LAB — I-STAT TROPONIN, ED: Troponin i, poc: 0 ng/mL (ref 0.00–0.08)

## 2015-01-07 LAB — CBC WITH DIFFERENTIAL/PLATELET
Basophils Absolute: 0 10*3/uL (ref 0.0–0.1)
Basophils Relative: 0 % (ref 0–1)
Eosinophils Absolute: 0 10*3/uL (ref 0.0–0.7)
Eosinophils Relative: 0 % (ref 0–5)
HCT: 43.4 % (ref 36.0–46.0)
Hemoglobin: 14.4 g/dL (ref 12.0–15.0)
LYMPHS ABS: 1.4 10*3/uL (ref 0.7–4.0)
LYMPHS PCT: 17 % (ref 12–46)
MCH: 29.1 pg (ref 26.0–34.0)
MCHC: 33.2 g/dL (ref 30.0–36.0)
MCV: 87.7 fL (ref 78.0–100.0)
Monocytes Absolute: 0.5 10*3/uL (ref 0.1–1.0)
Monocytes Relative: 6 % (ref 3–12)
NEUTROS ABS: 6.4 10*3/uL (ref 1.7–7.7)
Neutrophils Relative %: 77 % (ref 43–77)
PLATELETS: 238 10*3/uL (ref 150–400)
RBC: 4.95 MIL/uL (ref 3.87–5.11)
RDW: 13.6 % (ref 11.5–15.5)
WBC: 8.3 10*3/uL (ref 4.0–10.5)

## 2015-01-07 LAB — URINALYSIS, ROUTINE W REFLEX MICROSCOPIC
Bilirubin Urine: NEGATIVE
GLUCOSE, UA: NEGATIVE mg/dL
Hgb urine dipstick: NEGATIVE
KETONES UR: NEGATIVE mg/dL
Leukocytes, UA: NEGATIVE
Nitrite: NEGATIVE
PH: 7.5 (ref 5.0–8.0)
PROTEIN: NEGATIVE mg/dL
Specific Gravity, Urine: 1.015 (ref 1.005–1.030)
UROBILINOGEN UA: 0.2 mg/dL (ref 0.0–1.0)

## 2015-01-07 LAB — COMPREHENSIVE METABOLIC PANEL
ALT: 24 U/L (ref 0–35)
ANION GAP: 9 (ref 5–15)
AST: 25 U/L (ref 0–37)
Albumin: 4 g/dL (ref 3.5–5.2)
Alkaline Phosphatase: 57 U/L (ref 39–117)
BILIRUBIN TOTAL: 0.5 mg/dL (ref 0.3–1.2)
BUN: 8 mg/dL (ref 6–23)
CO2: 24 mmol/L (ref 19–32)
Calcium: 8.6 mg/dL (ref 8.4–10.5)
Chloride: 107 mmol/L (ref 96–112)
Creatinine, Ser: 0.64 mg/dL (ref 0.50–1.10)
GFR calc Af Amer: >90 mL/min (ref >90)
GFR calc non Af Amer: >90 mL/min (ref >90)
GLUCOSE: 106 mg/dL — AB (ref 70–99)
POTASSIUM: 4 mmol/L (ref 3.5–5.1)
SODIUM: 140 mmol/L (ref 135–145)
Total Protein: 7.2 g/dL (ref 6.0–8.3)

## 2015-01-07 LAB — POC URINE PREG, ED: Preg Test, Ur: NEGATIVE

## 2015-01-07 LAB — LIPASE, BLOOD: LIPASE: 19 U/L (ref 11–59)

## 2015-01-07 MED ORDER — SODIUM CHLORIDE 0.9 % IV BOLUS (SEPSIS)
1000.0000 mL | Freq: Once | INTRAVENOUS | Status: AC
  Administered 2015-01-07: 1000 mL via INTRAVENOUS

## 2015-01-07 MED ORDER — PROMETHAZINE HCL 25 MG RE SUPP: 25.0000 mg | Freq: Four times a day (QID) | RECTAL | Status: DC | PRN

## 2015-01-07 MED ORDER — PROMETHAZINE HCL 25 MG/ML IJ SOLN
25.0000 mg | Freq: Once | INTRAMUSCULAR | Status: AC
  Administered 2015-01-07: 25 mg via INTRAVENOUS
  Filled 2015-01-07: qty 1

## 2015-01-07 MED ORDER — MORPHINE SULFATE 4 MG/ML IJ SOLN
4.0000 mg | Freq: Once | INTRAMUSCULAR | Status: AC
Start: 2015-01-07 — End: 2015-01-07
  Administered 2015-01-07: 4 mg via INTRAVENOUS
  Filled 2015-01-07: qty 1

## 2015-01-07 MED ORDER — HYDROCODONE-ACETAMINOPHEN 5-325 MG PO TABS: 1.0000 | ORAL_TABLET | Freq: Four times a day (QID) | ORAL | Status: DC | PRN

## 2015-01-07 MED ORDER — PANTOPRAZOLE SODIUM 40 MG IV SOLR
40.0000 mg | Freq: Once | INTRAVENOUS | Status: AC
  Administered 2015-01-07: 40 mg via INTRAVENOUS
  Filled 2015-01-07: qty 40

## 2015-01-07 MED ORDER — OMEPRAZOLE 20 MG PO CPDR: 20.0000 mg | DELAYED_RELEASE_CAPSULE | Freq: Every day | ORAL | Status: DC

## 2015-01-07 NOTE — Discharge Instructions (Signed)
Your abdominal pain is likely from gastritis from alcohol use. Use prilosec as directed, and use phenergan suppositories for nausea as directed. Use norco as needed for abdominal pain, but don't drive while taking this. Avoid NSAIDs like ibuprofen or aleve which can irritate your gastritis. See your regular doctor in 1 week for ongoing care. Return to the ER for changes or worsening symptoms.  Abdominal (belly) pain can be caused by many things. Your caregiver performed an examination and possibly ordered blood/urine tests and imaging (CT scan, x-rays, ultrasound). Many cases can be observed and treated at home after initial evaluation in the emergency department. Even though you are being discharged home, abdominal pain can be unpredictable. Therefore, you need a repeated exam if your pain does not resolve, returns, or worsens. Most patients with abdominal pain don't have to be admitted to the hospital or have surgery, but serious problems like appendicitis and gallbladder attacks can start out as nonspecific pain. Many abdominal conditions cannot be diagnosed in one visit, so follow-up evaluations are very important. SEEK IMMEDIATE MEDICAL ATTENTION IF YOU DEVELOP ANY OF THE FOLLOWING SYMPTOMS:  The pain does not go away or becomes severe.   A temperature above 101 develops.   Repeated vomiting occurs (multiple episodes).   The pain becomes localized to portions of the abdomen. The right side could possibly be appendicitis. In an adult, the left lower portion of the abdomen could be colitis or diverticulitis.   Blood is being passed in stools or vomit (bright red or black tarry stools).   Return also if you develop chest pain, difficulty breathing, dizziness or fainting, or become confused, poorly responsive, or inconsolable (young children).  The constipation stays for more than 4 days.   There is belly (abdominal) or rectal pain.   You do not seem to be getting better.     Gastritis,  Adult Gastritis is soreness and swelling (inflammation) of the lining of the stomach. Gastritis can develop as a sudden onset (acute) or long-term (chronic) condition. If gastritis is not treated, it can lead to stomach bleeding and ulcers. CAUSES  Gastritis occurs when the stomach lining is weak or damaged. Digestive juices from the stomach then inflame the weakened stomach lining. The stomach lining may be weak or damaged due to viral or bacterial infections. One common bacterial infection is the Helicobacter pylori infection. Gastritis can also result from excessive alcohol consumption, taking certain medicines, or having too much acid in the stomach.  SYMPTOMS  In some cases, there are no symptoms. When symptoms are present, they may include:  Pain or a burning sensation in the upper abdomen.  Nausea.  Vomiting.  An uncomfortable feeling of fullness after eating. DIAGNOSIS  Your caregiver may suspect you have gastritis based on your symptoms and a physical exam. To determine the cause of your gastritis, your caregiver may perform the following:  Blood or stool tests to check for the H pylori bacterium.  Gastroscopy. A thin, flexible tube (endoscope) is passed down the esophagus and into the stomach. The endoscope has a light and camera on the end. Your caregiver uses the endoscope to view the inside of the stomach.  Taking a tissue sample (biopsy) from the stomach to examine under a microscope. TREATMENT  Depending on the cause of your gastritis, medicines may be prescribed. If you have a bacterial infection, such as an H pylori infection, antibiotics may be given. If your gastritis is caused by too much acid in the stomach, H2 blockers  or antacids may be given. Your caregiver may recommend that you stop taking aspirin, ibuprofen, or other nonsteroidal anti-inflammatory drugs (NSAIDs). HOME CARE INSTRUCTIONS  Only take over-the-counter or prescription medicines as directed by your  caregiver.  If you were given antibiotic medicines, take them as directed. Finish them even if you start to feel better.  Drink enough fluids to keep your urine clear or pale yellow.  Avoid foods and drinks that make your symptoms worse, such as:  Caffeine or alcoholic drinks.  Chocolate.  Peppermint or mint flavorings.  Garlic and onions.  Spicy foods.  Citrus fruits, such as oranges, lemons, or limes.  Tomato-based foods such as sauce, chili, salsa, and pizza.  Fried and fatty foods.  Eat small, frequent meals instead of large meals. SEEK IMMEDIATE MEDICAL CARE IF:   You have black or dark red stools.  You vomit blood or material that looks like coffee grounds.  You are unable to keep fluids down.  Your abdominal pain gets worse.  You have a fever.  You do not feel better after 1 week.  You have any other questions or concerns. MAKE SURE YOU:  Understand these instructions.  Will watch your condition.  Will get help right away if you are not doing well or get worse. Document Released: 11/11/2001 Document Revised: 05/18/2012 Document Reviewed: 12/31/2011 [facility] Patient Information 2015 [place], [place]. This information is not intended to replace advice given to you by your health care provider. Make sure you discuss any questions you have with your health care provider.  Nausea and Vomiting Nausea means you feel sick to your stomach. Throwing up (vomiting) is a reflex where stomach contents come out of your mouth. HOME CARE   Take medicine as told by your doctor.  Do not force yourself to eat. However, you do need to drink fluids.  If you feel like eating, eat a normal diet as told by your doctor.  Eat rice, wheat, potatoes, bread, lean meats, yogurt, fruits, and vegetables.  Avoid high-fat foods.  Drink enough fluids to keep your pee (urine) clear or pale yellow.  Ask your doctor how to replace body fluid losses (rehydrate). Signs of body fluid  loss (dehydration) include:  Feeling very thirsty.  Dry lips and mouth.  Feeling dizzy.  Dark pee.  Peeing less than normal.  Feeling confused.  Fast breathing or heart rate. GET HELP RIGHT AWAY IF:   You have blood in your throw up.  You have black or bloody poop (stool).  You have a bad headache or stiff neck.  You feel confused.  You have bad belly (abdominal) pain.  You have chest pain or trouble breathing.  You do not pee at least once every 8 hours.  You have cold, clammy skin.  You keep throwing up after 24 to 48 hours.  You have a fever. MAKE SURE YOU:   Understand these instructions.  Will watch your condition.  Will get help right away if you are not doing well or get worse. Document Released: 05/05/2008 Document Revised: 02/09/2012 Document Reviewed: 04/18/2011 [facility] Patient Information 2015 [place], [place]. This information is not intended to replace advice given to you by your health care provider. Make sure you discuss any questions you have with your health care provider.

## 2015-01-07 NOTE — ED Notes (Signed)
Attempted to draw labs when placing IV, and phlebotomy stick x1 w/o success. Have notified phlebotomy to draw labs.

## 2015-01-07 NOTE — ED Provider Notes (Signed)
CSN: [id number]     Arrival date & time 01/07/15  1240 History   First MD Initiated Contact with Patient 01/07/15 1306     Chief Complaint  Patient presents with  . Nausea  . Emesis     (Consider location/radiation/quality/duration/timing/severity/associated sxs/prior Treatment) HPI Comments: Kim HuaCarol E Watson is a 44 y.o. female with a PMHx of HTN and a PSHx of cholecystectomy and tubal ligation, who presents to the ED with complaints of nausea and vomiting 12 hours since returning home from a night of consuming alcohol. She states that she consumed 5 beers in 2 liquor beverages last night, and subsequently had 10 episodes of nonbloody nonbilious emesis since then. She has attempted to drink Pedialyte and ginger ale without improvement, and she has not been unable to tolerate fluids. She reports 6/10 cramping epigastric pain that radiates into bilateral upper quadrants, coming intermittently when she lays flat and consumes anything by mouth, and has been unrelieved with Zofran 4mg  and liquids. She additionally reports that intermittently she becomes lightheaded when she stands and sees "spots" which resolves after sitting down. She denies any headaches, fevers, chills, chest pain, shortness of breath, cough, URI symptoms, diarrhea, constipation, melena, hematochezia, hematemesis, hematuria, dysuria, vaginal bleeding or discharge, numbness, tingling, weakness, vision deficits, or tinnitus. Denies any syncope. Denies NSAID use, sick contacts, or suspicious food intake. Denies any recent travel.  Patient is a 44 y.o. female presenting with vomiting. The history is provided by the patient. No language interpreter was used.  Emesis Severity:  Moderate Duration:  12 hours Timing:  Constant Number of daily episodes:  10x since last night Quality:  Stomach contents Progression:  Unchanged Chronicity:  New Recent urination:  Normal Relieved by:  Nothing Worsened by:  Liquids Ineffective treatments:   Antiemetics (zofran 4mg ) Associated symptoms: abdominal pain (epigastric)   Associated symptoms: no arthralgias, no chills, no diarrhea, no fever, no headaches, no myalgias and no URI   Abdominal pain:    Location:  Epigastric   Quality:  Cramping   Severity:  Moderate (6/10)   Onset quality:  Gradual   Duration:  12 hours   Timing:  Constant   Progression:  Unchanged   Chronicity:  New Risk factors: alcohol use     Past Medical History  Diagnosis Date  . Hypertension    Past Surgical History  Procedure Laterality Date  . Cholecystectomy    . Tubal ligation     Family History  Problem Relation Age of Onset  . Diabetes Other   . Hyperlipidemia Other   . Hypertension Other    History  Substance Use Topics  . Smoking status: Never Smoker   . Smokeless tobacco: Not on file  . Alcohol Use: Yes     Comment: socially   OB History    No data available     Review of Systems  Constitutional: Negative for fever and chills.  Respiratory: Negative for shortness of breath.   Cardiovascular: Negative for chest pain.  Gastrointestinal: Positive for nausea, vomiting and abdominal pain (epigastric). Negative for diarrhea, constipation and blood in stool.  Genitourinary: Negative for dysuria, hematuria, flank pain, vaginal bleeding and vaginal discharge.  Musculoskeletal: Negative for myalgias and arthralgias.  Skin: Negative for rash.  Allergic/Immunologic: Negative for immunocompromised state.  Neurological: Positive for light-headedness (intermittently with standing). Negative for dizziness, syncope, weakness, numbness and headaches.  Psychiatric/Behavioral: Negative for confusion.   10 Systems reviewed and are negative for acute change except as noted in the HPI.  Allergies  Percocet  Home Medications   Prior to Admission medications   Medication Sig Start Date End Date Taking? Authorizing Provider  acetaminophen (TYLENOL) 325 MG tablet Take 650 mg by mouth every 6  (six) hours as needed for mild pain.    Yes Historical Provider, MD  citalopram (CELEXA) 20 MG tablet Take 20 mg by mouth daily.   Yes Historical Provider, MD  Cyclobenzaprine HCl (FLEXERIL PO) Take 10 mg by mouth 3 (three) times daily as needed (muscle spasms).    Yes Historical Provider, MD  famotidine (PEPCID) 20 MG tablet Take 20 mg by mouth 2 (two) times daily.   Yes Historical Provider, MD  albuterol (PROVENTIL HFA;VENTOLIN HFA) 108 (90 BASE) MCG/ACT inhaler Inhale 2 puffs into the lungs every 4 (four) hours as needed for wheezing or shortness of breath. 10/29/11 01/07/15  Trixie Dredge, PA-C  azithromycin (ZITHROMAX) 250 MG tablet Take 1 tablet (250 mg total) by mouth daily. Take first 2 tablets together, then 1 every day until finished. 12/02/13   Tiffany Irine Seal, PA-C  benzonatate (TESSALON) 100 MG capsule Take 1 capsule (100 mg total) by mouth every 8 (eight) hours. 12/02/13   Tiffany Irine Seal, PA-C  clonazePAM (KLONOPIN) 0.5 MG tablet Take 0.5 mg by mouth 2 (two) times daily as needed for anxiety.    Historical Provider, MD  ibuprofen (ADVIL,MOTRIN) 600 MG tablet Take 1 tablet (600 mg total) by mouth every 6 (six) hours as needed. 09/20/14   Shon Baton, MD  ondansetron (ZOFRAN) 4 MG tablet Take 1 tablet (4 mg total) by mouth every 8 (eight) hours as needed for nausea or vomiting. 09/20/14   Shon Baton, MD   BP 146/95 mmHg  Pulse 88  Temp(Src) 98.3 F (36.8 C) (Oral)  Resp 20  SpO2 100% Physical Exam  Constitutional: She is oriented to person, place, and time. Vital signs are normal. She appears well-developed and well-nourished.  Non-toxic appearance. No distress.  Afebrile, nontoxic, NAD  HENT:  Head: Normocephalic and atraumatic.  Mouth/Throat: Mucous membranes are dry.  Dry mucous membranes  Eyes: Conjunctivae and EOM are normal. Pupils are equal, round, and reactive to light. Right eye exhibits no discharge. Left eye exhibits no discharge.  Neck: Normal range of motion.  Neck supple.  Cardiovascular: Normal rate, regular rhythm, normal heart sounds and intact distal pulses.  Exam reveals no gallop and no friction rub.   No murmur heard. Pulmonary/Chest: Effort normal and breath sounds normal. No respiratory distress. She has no decreased breath sounds. She has no wheezes. She has no rhonchi. She has no rales.  Abdominal: Soft. Normal appearance and bowel sounds are normal. She exhibits no distension. There is tenderness in the epigastric area. There is no rigidity, no rebound, no guarding, no CVA tenderness, no tenderness at McBurney's point and negative Murphy's sign.    Soft, obese but ND, +BS throughout, with epigastric TTP, no r/g/r, neg murphy's, neg mcburney's, no CVA TTP   Musculoskeletal: Normal range of motion.  MAE x4 Strength and sensation grossly intact  Neurological: She is alert and oriented to person, place, and time. She has normal strength. No sensory deficit.  Skin: Skin is warm, dry and intact. No rash noted.  Psychiatric: She has a normal mood and affect.  Nursing note and vitals reviewed.   ED Course  Procedures (including critical care time) 13:45 Orthostatic Vital Signs HR  Orthostatic Lying  - BP- Lying: 123/84 mmHg ; Pulse- Lying: 85  Orthostatic Sitting -  BP- Sitting: 129/77 mmHg ; Pulse- Sitting: 84  Orthostatic Standing at 0 minutes - BP- Standing at 0 minutes: 126/85 mmHg ; Pulse- Standing at 0 minutes: 86    Labs Review Labs Reviewed  COMPREHENSIVE METABOLIC PANEL - Abnormal; Notable for the following:    Glucose, Bld 106 (*)    All other components within normal limits  URINALYSIS, ROUTINE W REFLEX MICROSCOPIC - Abnormal; Notable for the following:    APPearance CLOUDY (*)    All other components within normal limits  CBC WITH DIFFERENTIAL/PLATELET  LIPASE, BLOOD  POC URINE PREG, ED  I-STAT TROPOININ, ED    Imaging Review No results found.  EKG: NSR, borderline LAD, unchanged from prior  Date: 01/07/2015   Rate: 82  Rhythm: normal sinus rhythm  QRS Axis: left  Intervals: normal  ST/T Wave abnormalities: normal  Conduction Disutrbances:none  Narrative Interpretation:   Old EKG Reviewed: unchanged    MDM   Final diagnoses:  Non-intractable vomiting with nausea, vomiting of unspecified type  Gastritis  Intermittent lightheadedness  Epigastric abdominal pain    44 y.o. female with N/V after a night of consuming alcohol. Epigastrum TTP. Has some lightheadedness with standing, no syncopal episodes. Will get orthostatics, EKG, trop, labs, U/A, and give morphine, fluids, protonix, and phenergan since she tried zofran at home without help. Likely gastritis vs pancreatitis. Will hold on imaging for now, and reassess after labs.  4:25 PM CBC w/diff WNL. Lipase WNL. CMP WNL. Upreg neg. Trop neg. EKG Unchanged from prior. Orthostatics neg. Pt feels much better. Will PO challenge now. Awaiting U/A but this is likely gastritis from EtOH use. Discussed avoidance of alcohol/NSAIDs, good hydration, and will send home with norco, phenergan suppositories, and prilosec x2wks. Will have her f/up with PCP in 1 week.   4:41 PM U/A clear. PO challenging now. I explained the diagnosis and have given explicit precautions to return to the ER including for any other new or worsening symptoms. The patient understands and accepts the medical plan as it's been dictated and I have answered their questions. Discharge instructions concerning home care and prescriptions have been given. The patient is STABLE and is discharged to home in good condition.    BP 123/84 mmHg  Pulse 85  Temp(Src) 97.8 F (36.6 C) (Oral)  Resp 16  SpO2 97%  Meds ordered this encounter  Medications  . promethazine (PHENERGAN) injection 25 mg    Sig:   . sodium chloride 0.9 % bolus 1,000 mL    Sig:   . pantoprazole (PROTONIX) injection 40 mg    Sig:   . morphine 4 MG/ML injection 4 mg    Sig:   . omeprazole (PRILOSEC) 20 MG capsule     Sig: Take 1 capsule (20 mg total) by mouth daily. x2 weeks    Dispense:  14 capsule    Refill:  0    Order Specific Question:  Supervising Provider    Answer:  Eber Hong D [[id number]]  . promethazine (PHENERGAN) 25 MG suppository    Sig: Place 1 suppository (25 mg total) rectally every 6 (six) hours as needed for nausea or vomiting.    Dispense:  15 suppository    Refill:  1    Order Specific Question:  Supervising Provider    Answer:  Eber Hong D [[id number]]  . HYDROcodone-acetaminophen (NORCO) 5-325 MG per tablet    Sig: Take 1 tablet by mouth every 6 (six) hours as needed for severe pain.  Dispense:  6 tablet    Refill:  0    Order Specific Question:  Supervising Provider    Answer:  Vida Roller [id number] Nomie Buchberger Camprubi-Soms, PA-C 01/07/15 1641  Derwood Kaplan, MD 01/09/15 [phone]

## 2015-01-07 NOTE — ED Notes (Signed)
Pt c/o of nausea/vomiting. States that she went out with friends last night and has been sick since. States that she has take 4mg  Zofran with no relief. Unable to keep down sips of water.

## 2015-01-07 NOTE — ED Notes (Signed)
Pt unable to void 

## 2015-01-07 NOTE — ED Notes (Signed)
Patient aware that a urine sample is needed, patient will let staff know when able to provide sample.

## 2015-08-08 ENCOUNTER — Encounter (HOSPITAL_COMMUNITY): Payer: Self-pay | Admitting: *Deleted

## 2015-08-08 ENCOUNTER — Emergency Department (HOSPITAL_COMMUNITY)
Admission: EM | Admit: 2015-08-08 | Discharge: 2015-08-09 | Disposition: A | Discharge status: Home / Self Care | Payer: BLUE CROSS/BLUE SHIELD | Attending: Emergency Medicine | Admitting: Emergency Medicine

## 2015-08-08 DIAGNOSIS — R1010 Upper abdominal pain, unspecified: Secondary | ICD-10-CM

## 2015-08-08 DIAGNOSIS — Z79899 Other long term (current) drug therapy: Secondary | ICD-10-CM | POA: Insufficient documentation

## 2015-08-08 DIAGNOSIS — I10 Essential (primary) hypertension: Secondary | ICD-10-CM | POA: Insufficient documentation

## 2015-08-08 DIAGNOSIS — G43909 Migraine, unspecified, not intractable, without status migrainosus: Secondary | ICD-10-CM

## 2015-08-08 DIAGNOSIS — Z3202 Encounter for pregnancy test, result negative: Secondary | ICD-10-CM | POA: Insufficient documentation

## 2015-08-08 DIAGNOSIS — F419 Anxiety disorder, unspecified: Secondary | ICD-10-CM

## 2015-08-08 HISTORY — DX: Migraine, unspecified, not intractable, without status migrainosus: G43.909

## 2015-08-08 LAB — COMPREHENSIVE METABOLIC PANEL
ALBUMIN: 4 g/dL (ref 3.5–5.0)
ALT: 17 U/L (ref 14–54)
AST: 20 U/L (ref 15–41)
Alkaline Phosphatase: 60 U/L (ref 38–126)
Anion gap: 8 (ref 5–15)
BILIRUBIN TOTAL: 0.5 mg/dL (ref 0.3–1.2)
BUN: 17 mg/dL (ref 6–20)
CHLORIDE: 104 mmol/L (ref 101–111)
CO2: 25 mmol/L (ref 22–32)
Calcium: 8.9 mg/dL (ref 8.9–10.3)
Creatinine, Ser: 0.78 mg/dL (ref 0.44–1.00)
GFR calc Af Amer: >60 mL/min (ref >60)
GFR calc non Af Amer: >60 mL/min (ref >60)
GLUCOSE: 95 mg/dL (ref 65–99)
POTASSIUM: 3.9 mmol/L (ref 3.5–5.1)
SODIUM: 137 mmol/L (ref 135–145)
TOTAL PROTEIN: 7.1 g/dL (ref 6.5–8.1)

## 2015-08-08 LAB — CBC
HEMATOCRIT: 42.1 % (ref 36.0–46.0)
HEMOGLOBIN: 13.7 g/dL (ref 12.0–15.0)
MCH: 29 pg (ref 26.0–34.0)
MCHC: 32.5 g/dL (ref 30.0–36.0)
MCV: 89 fL (ref 78.0–100.0)
Platelets: 205 10*3/uL (ref 150–400)
RBC: 4.73 MIL/uL (ref 3.87–5.11)
RDW: 13.5 % (ref 11.5–15.5)
WBC: 6.2 10*3/uL (ref 4.0–10.5)

## 2015-08-08 LAB — CBG MONITORING, ED: Glucose-Capillary: 100 mg/dL — ABNORMAL HIGH (ref 65–99)

## 2015-08-08 MED ORDER — GI COCKTAIL ~~LOC~~
30.0000 mL | Freq: Once | ORAL | Status: AC
  Administered 2015-08-09: 30 mL via ORAL
  Filled 2015-08-08: qty 30

## 2015-08-08 MED ORDER — LORAZEPAM 2 MG/ML IJ SOLN
0.5000 mg | Freq: Once | INTRAMUSCULAR | Status: AC
  Administered 2015-08-09: 0.5 mg via INTRAVENOUS
  Filled 2015-08-08: qty 1

## 2015-08-08 MED ORDER — SODIUM CHLORIDE 0.9 % IV BOLUS (SEPSIS)
1000.0000 mL | Freq: Once | INTRAVENOUS | Status: AC
  Administered 2015-08-09: 1000 mL via INTRAVENOUS

## 2015-08-08 MED ORDER — METOCLOPRAMIDE HCL 5 MG/ML IJ SOLN
10.0000 mg | Freq: Once | INTRAMUSCULAR | Status: AC
  Administered 2015-08-09: 10 mg via INTRAVENOUS
  Filled 2015-08-08: qty 2

## 2015-08-08 MED ORDER — METHYLPREDNISOLONE SODIUM SUCC 125 MG IJ SOLR
125.0000 mg | Freq: Once | INTRAMUSCULAR | Status: AC
  Administered 2015-08-09: 125 mg via INTRAVENOUS
  Filled 2015-08-08: qty 2

## 2015-08-08 NOTE — ED Provider Notes (Signed)
CSN: [id number]     Arrival date & time 08/08/15  2122 History  This chart was scribed for Loren Racer, MD by Doreatha Martin, ED Scribe. This patient was seen in room WA19/WA19 and the patient's care was started at 11:43 PM.     Chief Complaint  Patient presents with  . Nausea   The history is provided by the patient. No language interpreter was used.    HPI Comments: Kim Watson is a 44 y.o. female with Hx of HTN, migraines, recent bariatric surgery (02/20/2015) who presents to the Emergency Department complaining of moderate nausea onset this afternoon at noon with associated emesis, lightheadedness, chills, diaphoresis, increased anxiety, generalized aches. She also notes an intermittent HA localized behind her left eye intermittent for 2 weeks with associated photophobia, blurry vision, blepharospasm. Pt states she took a Pain-aid (acetaminophen based) for the HA at 11 am while she was at work with orange juice (she notes that the orange juice was unusual for her). She states at noon she began to feel nauseated, lightheaded and vomit. Pt notes that she took a nap with mild relief. She notes that her lightheadedness persisted throughout the evening. She notes that the only thing that she has consumed today is orange juice and soup, but has not been able to keep it down. She has been taking Tylenol for the HA with mild relief.  Pt has not been seen by neurology for her chronic HA. She takes Xanax PRN for anxiety. Pt does not take NSAIDS. Pt denies recent increased stress. She also denies diarrhea, rhinorrhea, sore throat, fever. No neck pain or stiffness. No focal weakness or numbness. Headaches are gradual onset and similar to previous migraines.  Past Medical History  Diagnosis Date  . Hypertension   . Migraine    Past Surgical History  Procedure Laterality Date  . Cholecystectomy    . Tubal ligation     Family History  Problem Relation Age of Onset  . Diabetes Other   . Hyperlipidemia  Other   . Hypertension Other    Social History  Substance Use Topics  . Smoking status: Never Smoker   . Smokeless tobacco: None  . Alcohol Use: Yes     Comment: socially   OB History    No data available     Review of Systems  Constitutional: Negative for fever and chills.  Eyes: Positive for photophobia.  Respiratory: Negative for shortness of breath.   Cardiovascular: Negative for chest pain.  Gastrointestinal: Positive for nausea, vomiting and abdominal pain. Negative for diarrhea and constipation.  Genitourinary: Negative for dysuria, frequency and flank pain.  Musculoskeletal: Negative for back pain, neck pain and neck stiffness.  Skin: Negative for rash and wound.  Neurological: Positive for dizziness, light-headedness and headaches. Negative for weakness and numbness.  Psychiatric/Behavioral: The patient is nervous/anxious.   All other systems reviewed and are negative.  Allergies  Percocet  Home Medications   Prior to Admission medications   Medication Sig Start Date End Date Taking? Authorizing Provider  acetaminophen (TYLENOL) 325 MG tablet Take 650 mg by mouth every 6 (six) hours as needed for mild pain.    Yes Historical Provider, MD  albuterol (PROVENTIL HFA;VENTOLIN HFA) 108 (90 BASE) MCG/ACT inhaler Inhale 2 puffs into the lungs every 4 (four) hours as needed for wheezing or shortness of breath. 10/29/11 08/08/15 Yes Trixie Dredge, PA-C  ALPRAZolam Prudy Feeler) 0.5 MG tablet Take 0.5 mg by mouth at bedtime as needed for anxiety.  Yes Historical Provider, MD  Biotin 5000 MCG TABS Take 1 tablet by mouth daily.   Yes Historical Provider, MD  cholecalciferol (VITAMIN D) 1000 UNITS tablet Take 1,000 Units by mouth daily.   Yes Historical Provider, MD  citalopram (CELEXA) 20 MG tablet Take 20 mg by mouth daily.   Yes Historical Provider, MD  Multiple Vitamin (MULTIVITAMIN WITH MINERALS) TABS tablet Take 1 tablet by mouth daily.   Yes Historical Provider, MD  PROPRANOLOL HCL  PO Take 1 tablet by mouth daily.   Yes Historical Provider, MD  azithromycin (ZITHROMAX) 250 MG tablet Take 1 tablet (250 mg total) by mouth daily. Take first 2 tablets together, then 1 every day until finished. Patient not taking: Reported on 01/07/2015 12/02/13   Marlon Pel, PA-C  benzonatate (TESSALON) 100 MG capsule Take 1 capsule (100 mg total) by mouth every 8 (eight) hours. Patient not taking: Reported on 01/07/2015 12/02/13   Marlon Pel, PA-C  HYDROcodone-acetaminophen (NORCO) 5-325 MG per tablet Take 1 tablet by mouth every 6 (six) hours as needed for severe pain. Patient not taking: Reported on 08/08/2015 01/07/15   Mercedes Camprubi-Soms, PA-C  ibuprofen (ADVIL,MOTRIN) 600 MG tablet Take 1 tablet (600 mg total) by mouth every 6 (six) hours as needed. Patient not taking: Reported on 01/07/2015 09/20/14   Shon Baton, MD  metoCLOPramide (REGLAN) 10 MG tablet Take 1 tablet (10 mg total) by mouth every 6 (six) hours as needed for nausea (nausea/headache). 08/09/15   Loren Racer, MD  omeprazole (PRILOSEC) 20 MG capsule Take 1 capsule (20 mg total) by mouth daily. x2 weeks Patient not taking: Reported on 08/08/2015 01/07/15   Mercedes Camprubi-Soms, PA-C  ondansetron (ZOFRAN) 4 MG tablet Take 1 tablet (4 mg total) by mouth every 8 (eight) hours as needed for nausea or vomiting. Patient not taking: Reported on 01/07/2015 09/20/14   Shon Baton, MD  promethazine (PHENERGAN) 25 MG suppository Place 1 suppository (25 mg total) rectally every 6 (six) hours as needed for nausea or vomiting. Patient not taking: Reported on 08/08/2015 01/07/15   Mercedes Camprubi-Soms, PA-C   BP 111/68 mmHg  Pulse 72  Temp(Src) 98.6 F (37 C) (Oral)  Resp 18  SpO2 93%  LMP 08/01/2015 Physical Exam  Constitutional: She is oriented to person, place, and time. She appears well-developed and well-nourished. No distress.  Anxious appearing  HENT:  Head: Normocephalic and atraumatic.  Mouth/Throat: Oropharynx is  clear and moist.  No frontal or maxillary sinus tenderness with percussion  Eyes: EOM are normal. Pupils are equal, round, and reactive to light.  Neck: Normal range of motion. Neck supple.  No meningismus  Cardiovascular: Normal rate and regular rhythm.  Exam reveals no gallop and no friction rub.   No murmur heard. Pulmonary/Chest: Effort normal and breath sounds normal. No respiratory distress. She has no wheezes. She has no rales. She exhibits no tenderness.  Abdominal: Soft. Bowel sounds are normal. She exhibits no distension and no mass. There is tenderness (very mild epigastric tenderness). There is no rebound and no guarding.  Musculoskeletal: Normal range of motion. She exhibits no edema or tenderness.  No CVA tenderness. No lower extremity swelling or pain.  Neurological: She is alert and oriented to person, place, and time.  Patient is alert and oriented x3 with clear, goal oriented speech. Patient has 5/5 motor in all extremities. Sensation is intact to light touch. Bilateral finger-to-nose is normal with no signs of dysmetria.   Skin: Skin is warm and dry.  No rash noted. No erythema.  Nursing note and vitals reviewed.   ED Course  Procedures (including critical care time) DIAGNOSTIC STUDIES: Oxygen Saturation is 100% on RA, normal by my interpretation.    COORDINATION OF CARE: 11:50 PM Discussed treatment plan with pt at bedside and pt agreed to plan.   Labs Review Labs Reviewed  URINALYSIS, ROUTINE W REFLEX MICROSCOPIC (NOT AT [facility]) - Abnormal; Notable for the following:    APPearance CLOUDY (*)    All other components within normal limits  CBG MONITORING, ED - Abnormal; Notable for the following:    Glucose-Capillary 100 (*)    All other components within normal limits  COMPREHENSIVE METABOLIC PANEL  CBC  LIPASE, BLOOD  CBG MONITORING, ED  I-STAT BETA HCG BLOOD, ED ([facility], [facility], [facility] ONLY)    Imaging Review No results found. I have personally reviewed and evaluated  these images and lab results as part of my medical decision-making.   EKG Interpretation None      MDM   Final diagnoses:  Migraine without status migrainosus, not intractable, unspecified migraine type  Anxiety  Upper abdominal pain    I personally performed the services described in this documentation, which was scribed in my presence. The recorded information has been reviewed and is accurate.    Loren Racer, MD 08/09/15 [phone]

## 2015-08-08 NOTE — ED Notes (Signed)
Pt reports taking non-asa with orange juice for  migraine h/a tonight and started to have n/v and lightheadedness.  Pt became anxious.  Pt reports having gastric bypass in the past and states that the acid from the OJ had irritated her stomach.  Pt states she laid down to try to calm down.  Reports h/a recurred.  Pt also reports lightheadedness and weakness.

## 2015-08-09 LAB — URINALYSIS, ROUTINE W REFLEX MICROSCOPIC
Bilirubin Urine: NEGATIVE
GLUCOSE, UA: NEGATIVE mg/dL
Hgb urine dipstick: NEGATIVE
KETONES UR: NEGATIVE mg/dL
LEUKOCYTES UA: NEGATIVE
Nitrite: NEGATIVE
PH: 7 (ref 5.0–8.0)
Protein, ur: NEGATIVE mg/dL
SPECIFIC GRAVITY, URINE: 1.02 (ref 1.005–1.030)
Urobilinogen, UA: 0.2 mg/dL (ref 0.0–1.0)

## 2015-08-09 LAB — LIPASE, BLOOD: Lipase: 23 U/L (ref 22–51)

## 2015-08-09 LAB — I-STAT BETA HCG BLOOD, ED (MC, WL, AP ONLY): I-stat hCG, quantitative: <5 m[IU]/mL (ref <5)

## 2015-08-09 MED ORDER — METOCLOPRAMIDE HCL 10 MG PO TABS: 10.0000 mg | ORAL_TABLET | Freq: Four times a day (QID) | ORAL | Status: DC | PRN

## 2015-08-09 NOTE — Discharge Instructions (Signed)
Migraine Headache °A migraine headache is an intense, throbbing pain on one or both sides of your head. A migraine can last for 30 minutes to several hours. °CAUSES  °The exact cause of a migraine headache is not always known. However, a migraine may be caused when nerves in the brain become irritated and release chemicals that cause inflammation. This causes pain. °Certain things may also trigger migraines, such as: °· Alcohol. °· Smoking. °· Stress. °· Menstruation. °· Aged cheeses. °· Foods or drinks that contain nitrates, glutamate, aspartame, or tyramine. °· Lack of sleep. °· Chocolate. °· Caffeine. °· Hunger. °· Physical exertion. °· Fatigue. °· Medicines used to treat chest pain (nitroglycerine), birth control pills, estrogen, and some blood pressure medicines. °SIGNS AND SYMPTOMS °· Pain on one or both sides of your head. °· Pulsating or throbbing pain. °· Severe pain that prevents daily activities. °· Pain that is aggravated by any physical activity. °· Nausea, vomiting, or both. °· Dizziness. °· Pain with exposure to bright lights, loud noises, or activity. °· General sensitivity to bright lights, loud noises, or smells. °Before you get a migraine, you may get warning signs that a migraine is coming (aura). An aura may include: °· Seeing flashing lights. °· Seeing bright spots, halos, or zigzag lines. °· Having tunnel vision or blurred vision. °· Having feelings of numbness or tingling. °· Having trouble talking. °· Having muscle weakness. °DIAGNOSIS  °A migraine headache is often diagnosed based on: °· Symptoms. °· Physical exam. °· A CT scan or MRI of your head. These imaging tests cannot diagnose migraines, but they can help rule out other causes of headaches. °TREATMENT °Medicines may be given for pain and nausea. Medicines can also be given to help prevent recurrent migraines.  °HOME CARE INSTRUCTIONS °· Only take over-the-counter or prescription medicines for pain or discomfort as directed by your  health care provider. The use of long-term narcotics is not recommended. °· Lie down in a dark, quiet room when you have a migraine. °· Keep a journal to find out what may trigger your migraine headaches. For example, write down: °¨ What you eat and drink. °¨ How much sleep you get. °¨ Any change to your diet or medicines. °· Limit alcohol consumption. °· Quit smoking if you smoke. °· Get 7-9 hours of sleep, or as recommended by your health care provider. °· Limit stress. °· Keep lights dim if bright lights bother you and make your migraines worse. °SEEK IMMEDIATE MEDICAL CARE IF:  °· Your migraine becomes severe. °· You have a fever. °· You have a stiff neck. °· You have vision loss. °· You have muscular weakness or loss of muscle control. °· You start losing your balance or have trouble walking. °· You feel faint or pass out. °· You have severe symptoms that are different from your first symptoms. °MAKE SURE YOU:  °· Understand these instructions. °· Will watch your condition. °· Will get help right away if you are not doing well or get worse. °Document Released: 11/17/2005 Document Revised: 04/03/2014 Document Reviewed: 07/25/2013 °Exit[employer]® Patient Information ©2015 [employer]. This information is not intended to replace advice given to you by your health care provider. Make sure you discuss any questions you have with your health care provider. ° °Panic Attacks °Panic attacks are sudden, short-lived surges of severe anxiety, fear, or discomfort. They may occur for no reason when you are relaxed, when you are anxious, or when you are sleeping. Panic attacks may occur for a number   of reasons:   Healthy people occasionally have panic attacks in extreme, life-threatening situations, such as war or natural disasters. Normal anxiety is a protective mechanism of the body that helps [place] react to danger (fight or flight response).  Panic attacks are often seen with anxiety disorders, such as panic disorder, social  anxiety disorder, generalized anxiety disorder, and phobias. Anxiety disorders cause excessive or uncontrollable anxiety. They may interfere with your relationships or other life activities.  Panic attacks are sometimes seen with other mental illnesses, such as depression and posttraumatic stress disorder.  Certain medical conditions, prescription medicines, and drugs of abuse can cause panic attacks. SYMPTOMS  Panic attacks start suddenly, peak within 20 minutes, and are accompanied by four or more of the following symptoms:  Pounding heart or fast heart rate (palpitations).  Sweating.  Trembling or shaking.  Shortness of breath or feeling smothered.  Feeling choked.  Chest pain or discomfort.  Nausea or strange feeling in your stomach.  Dizziness, light-headedness, or feeling like you will faint.  Chills or hot flushes.  Numbness or tingling in your lips or hands and feet.  Feeling that things are not real or feeling that you are not yourself.  Fear of losing control or going crazy.  Fear of dying. Some of these symptoms can mimic serious medical conditions. For example, you may think you are having a heart attack. Although panic attacks can be very scary, they are not life threatening. DIAGNOSIS  Panic attacks are diagnosed through an assessment by your health care provider. Your health care provider will ask questions about your symptoms, such as where and when they occurred. Your health care provider will also ask about your medical history and use of alcohol and drugs, including prescription medicines. Your health care provider may order blood tests or other studies to rule out a serious medical condition. Your health care provider may refer you to a mental health professional for further evaluation. TREATMENT   Most healthy people who have one or two panic attacks in an extreme, life-threatening situation will not require treatment.  The treatment for panic attacks  associated with anxiety disorders or other mental illness typically involves counseling with a mental health professional, medicine, or a combination of both. Your health care provider will help determine what treatment is best for you.  Panic attacks due to physical illness usually go away with treatment of the illness. If prescription medicine is causing panic attacks, talk with your health care provider about stopping the medicine, decreasing the dose, or substituting another medicine.  Panic attacks due to alcohol or drug abuse go away with abstinence. Some adults need professional help in order to stop drinking or using drugs. HOME CARE INSTRUCTIONS   Take all medicines as directed by your health care provider.   Schedule and attend follow-up visits as directed by your health care provider. It is important to keep all your appointments. SEEK MEDICAL CARE IF:  You are not able to take your medicines as prescribed.  Your symptoms do not improve or get worse. SEEK IMMEDIATE MEDICAL CARE IF:   You experience panic attack symptoms that are different than your usual symptoms.  You have serious thoughts about hurting yourself or others.  You are taking medicine for panic attacks and have a serious side effect. MAKE SURE YOU:  Understand these instructions.  Will watch your condition.  Will get help right away if you are not doing well or get worse. Document Released: 11/17/2005 Document Revised: 11/22/2013  Document Reviewed: 07/01/2013 [facility] Patient Information 2015 [place], [place]. This information is not intended to replace advice given to you by your health care provider. Make sure you discuss any questions you have with your health care provider.

## 2016-02-27 DIAGNOSIS — Z903 Acquired absence of stomach [part of]: Secondary | ICD-10-CM | POA: Insufficient documentation

## 2016-06-13 ENCOUNTER — Emergency Department (HOSPITAL_BASED_OUTPATIENT_CLINIC_OR_DEPARTMENT_OTHER)
Admission: EM | Admit: 2016-06-13 | Discharge: 2016-06-13 | Disposition: A | Discharge status: Home / Self Care | Payer: BLUE CROSS/BLUE SHIELD | Attending: Emergency Medicine | Admitting: Emergency Medicine

## 2016-06-13 ENCOUNTER — Encounter (HOSPITAL_BASED_OUTPATIENT_CLINIC_OR_DEPARTMENT_OTHER): Payer: Self-pay | Admitting: *Deleted

## 2016-06-13 ENCOUNTER — Emergency Department (HOSPITAL_BASED_OUTPATIENT_CLINIC_OR_DEPARTMENT_OTHER): Payer: BLUE CROSS/BLUE SHIELD

## 2016-06-13 DIAGNOSIS — M542 Cervicalgia: Secondary | ICD-10-CM | POA: Insufficient documentation

## 2016-06-13 DIAGNOSIS — Z79899 Other long term (current) drug therapy: Secondary | ICD-10-CM | POA: Insufficient documentation

## 2016-06-13 DIAGNOSIS — R51 Headache: Secondary | ICD-10-CM | POA: Insufficient documentation

## 2016-06-13 DIAGNOSIS — I10 Essential (primary) hypertension: Secondary | ICD-10-CM | POA: Insufficient documentation

## 2016-06-13 DIAGNOSIS — R519 Headache, unspecified: Secondary | ICD-10-CM

## 2016-06-13 LAB — COMPREHENSIVE METABOLIC PANEL
ALBUMIN: 3.8 g/dL (ref 3.5–5.0)
ALK PHOS: 50 U/L (ref 38–126)
ALT: 13 U/L — ABNORMAL LOW (ref 14–54)
ANION GAP: 8 (ref 5–15)
AST: 19 U/L (ref 15–41)
BUN: 21 mg/dL — ABNORMAL HIGH (ref 6–20)
CALCIUM: 8.8 mg/dL — AB (ref 8.9–10.3)
CHLORIDE: 105 mmol/L (ref 101–111)
CO2: 24 mmol/L (ref 22–32)
Creatinine, Ser: 0.82 mg/dL (ref 0.44–1.00)
GFR calc non Af Amer: >60 mL/min (ref >60)
GLUCOSE: 106 mg/dL — AB (ref 65–99)
Potassium: 3.5 mmol/L (ref 3.5–5.1)
SODIUM: 137 mmol/L (ref 135–145)
Total Bilirubin: 0.4 mg/dL (ref 0.3–1.2)
Total Protein: 6.8 g/dL (ref 6.5–8.1)

## 2016-06-13 LAB — DIFFERENTIAL
BASOS PCT: 0 %
Basophils Absolute: 0 10*3/uL (ref 0.0–0.1)
EOS PCT: 5 %
Eosinophils Absolute: 0.3 10*3/uL (ref 0.0–0.7)
LYMPHS PCT: 38 %
Lymphs Abs: 2.3 10*3/uL (ref 0.7–4.0)
MONO ABS: 0.6 10*3/uL (ref 0.1–1.0)
Monocytes Relative: 9 %
NEUTROS ABS: 2.9 10*3/uL (ref 1.7–7.7)
NEUTROS PCT: 48 %

## 2016-06-13 LAB — CBC
HCT: 38.7 % (ref 36.0–46.0)
Hemoglobin: 12.7 g/dL (ref 12.0–15.0)
MCH: 28.8 pg (ref 26.0–34.0)
MCHC: 32.8 g/dL (ref 30.0–36.0)
MCV: 87.8 fL (ref 78.0–100.0)
PLATELETS: 223 10*3/uL (ref 150–400)
RBC: 4.41 MIL/uL (ref 3.87–5.11)
RDW: 12.9 % (ref 11.5–15.5)
WBC: 6.1 10*3/uL (ref 4.0–10.5)

## 2016-06-13 LAB — TROPONIN I: Troponin I: <0.03 ng/mL (ref <0.03)

## 2016-06-13 LAB — CBG MONITORING, ED: Glucose-Capillary: 114 mg/dL — ABNORMAL HIGH (ref 65–99)

## 2016-06-13 MED ORDER — DEXAMETHASONE SODIUM PHOSPHATE 10 MG/ML IJ SOLN
10.0000 mg | Freq: Once | INTRAMUSCULAR | Status: AC
  Administered 2016-06-13: 10 mg via INTRAVENOUS
  Filled 2016-06-13: qty 1

## 2016-06-13 MED ORDER — SODIUM CHLORIDE 0.9 % IV BOLUS (SEPSIS)
1000.0000 mL | Freq: Once | INTRAVENOUS | Status: AC
  Administered 2016-06-13: 1000 mL via INTRAVENOUS

## 2016-06-13 MED ORDER — PROMETHAZINE HCL 25 MG/ML IJ SOLN
12.5000 mg | Freq: Once | INTRAMUSCULAR | Status: AC
  Administered 2016-06-13: 12.5 mg via INTRAVENOUS
  Filled 2016-06-13: qty 1

## 2016-06-13 MED ORDER — SODIUM CHLORIDE 0.9 % IV SOLN: INTRAVENOUS | Status: DC

## 2016-06-13 MED ORDER — DIPHENHYDRAMINE HCL 50 MG/ML IJ SOLN
25.0000 mg | Freq: Once | INTRAMUSCULAR | Status: AC
  Administered 2016-06-13: 25 mg via INTRAVENOUS
  Filled 2016-06-13: qty 1

## 2016-06-13 NOTE — Discharge Instructions (Signed)
Today's workup without any significant findings. Head CT was negative labs without significant abnormality. In addition migraine cocktail did help the headache applying that this may be a migraine variant for you. However based on the location being different than some of the symptoms being different [facility] [place] [place]. Follow-up with your doctor for these results of the MRI. Return for any new or worse symptoms.

## 2016-06-13 NOTE — ED Notes (Signed)
Head pressure since yesterday. Pain comes and goes. Shoulders and neck are sore. States she has been tired and had fatigue for a week.

## 2016-06-13 NOTE — ED Provider Notes (Signed)
CSN: [id number]     Arrival date & time 06/13/16  1508 History   First MD Initiated Contact with Patient 06/13/16 1554     Chief Complaint  Patient presents with  . Headache     (Consider location/radiation/quality/duration/timing/severity/associated sxs/prior Treatment) Patient is a 45 y.o. female presenting with headaches. The history is provided by the patient.  Headache Associated symptoms: congestion, fatigue, neck pain and weakness   Associated symptoms: no abdominal pain and no numbness   Patient has a history of migraines. But as been experiencing a right-sided headache on the right back of the head and down into the neck for the past 5 days. Shortly after onset has some gait abnormalities. That has resolved. Since then has had some slurred speech that is intermittent and very brief lasting less than 2 minutes. Questionable numbness to the tip of the tongue but no other numbness. No upper extremity or lower extremity weakness. Some foggy vision. No fevers. No chest pain no shortness of breath no abdominal pain.  Past Medical History  Diagnosis Date  . Hypertension   . Migraine    Past Surgical History  Procedure Laterality Date  . Cholecystectomy    . Tubal ligation    . Sleeve gastroplasty     Family History  Problem Relation Age of Onset  . Diabetes Other   . Hyperlipidemia Other   . Hypertension Other    Social History  Substance Use Topics  . Smoking status: Never Smoker   . Smokeless tobacco: None  . Alcohol Use: Yes     Comment: socially   OB History    No data available     Review of Systems  Constitutional: Positive for fatigue.  HENT: Positive for congestion.   Eyes: Negative for visual disturbance.  Respiratory: Negative for shortness of breath.   Cardiovascular: Negative for chest pain.  Gastrointestinal: Negative for abdominal pain.  Genitourinary: Negative for dysuria.  Musculoskeletal: Positive for neck pain.  Skin: Negative for rash.   Neurological: Positive for speech difficulty, weakness, light-headedness and headaches. Negative for facial asymmetry and numbness.      Allergies  Percocet  Home Medications   Prior to Admission medications   Medication Sig Start Date End Date Taking? Authorizing Provider  acetaminophen (TYLENOL) 325 MG tablet Take 650 mg by mouth every 6 (six) hours as needed for mild pain.     Historical Provider, MD  albuterol (PROVENTIL HFA;VENTOLIN HFA) 108 (90 BASE) MCG/ACT inhaler Inhale 2 puffs into the lungs every 4 (four) hours as needed for wheezing or shortness of breath. 10/29/11 08/08/15  Trixie DredgeEmily West, PA-C  ALPRAZolam Prudy Feeler(XANAX) 0.5 MG tablet Take 0.5 mg by mouth at bedtime as needed for anxiety.    Historical Provider, MD  azithromycin (ZITHROMAX) 250 MG tablet Take 1 tablet (250 mg total) by mouth daily. Take first 2 tablets together, then 1 every day until finished. Patient not taking: Reported on 01/07/2015 12/02/13   Marlon Peliffany Greene, PA-C  benzonatate (TESSALON) 100 MG capsule Take 1 capsule (100 mg total) by mouth every 8 (eight) hours. Patient not taking: Reported on 01/07/2015 12/02/13   Marlon Peliffany Greene, PA-C  Biotin 5000 MCG TABS Take 1 tablet by mouth daily.    Historical Provider, MD  cholecalciferol (VITAMIN D) 1000 UNITS tablet Take 1,000 Units by mouth daily.    Historical Provider, MD  citalopram (CELEXA) 20 MG tablet Take 20 mg by mouth daily.    Historical Provider, MD  HYDROcodone-acetaminophen (NORCO) 5-325 MG per tablet  Take 1 tablet by mouth every 6 (six) hours as needed for severe pain. Patient not taking: Reported on 08/08/2015 01/07/15   Mercedes Camprubi-Soms, PA-C  ibuprofen (ADVIL,MOTRIN) 600 MG tablet Take 1 tablet (600 mg total) by mouth every 6 (six) hours as needed. Patient not taking: Reported on 01/07/2015 09/20/14   Shon Baton, MD  metoCLOPramide (REGLAN) 10 MG tablet Take 1 tablet (10 mg total) by mouth every 6 (six) hours as needed for nausea (nausea/headache). 08/09/15    Loren Racer, MD  Multiple Vitamin (MULTIVITAMIN WITH MINERALS) TABS tablet Take 1 tablet by mouth daily.    Historical Provider, MD  omeprazole (PRILOSEC) 20 MG capsule Take 1 capsule (20 mg total) by mouth daily. x2 weeks Patient not taking: Reported on 08/08/2015 01/07/15   Mercedes Camprubi-Soms, PA-C  ondansetron (ZOFRAN) 4 MG tablet Take 1 tablet (4 mg total) by mouth every 8 (eight) hours as needed for nausea or vomiting. Patient not taking: Reported on 01/07/2015 09/20/14   Shon Baton, MD  promethazine (PHENERGAN) 25 MG suppository Place 1 suppository (25 mg total) rectally every 6 (six) hours as needed for nausea or vomiting. Patient not taking: Reported on 08/08/2015 01/07/15   Mercedes Camprubi-Soms, PA-C  PROPRANOLOL HCL PO Take 1 tablet by mouth daily.    Historical Provider, MD   BP 115/70 mmHg  Pulse 57  Temp(Src) 98.8 F (37.1 C) (Oral)  Resp 18  Ht 5\' 6"  (1.676 m)  Wt 103.874 kg  BMI 36.98 kg/m2  SpO2 100%  LMP 05/24/2016 Physical Exam  Constitutional: She is oriented to person, place, and time. She appears well-developed and well-nourished. No distress.  HENT:  Head: Normocephalic.  Mouth/Throat: Oropharynx is clear and moist. No oropharyngeal exudate.  Eyes: Conjunctivae and EOM are normal. Pupils are equal, round, and reactive to light.  Neck: Normal range of motion. Neck supple.  Cardiovascular: Normal rate, regular rhythm and normal heart sounds.   Pulmonary/Chest: Effort normal and breath sounds normal. No respiratory distress.  Abdominal: Soft. Bowel sounds are normal. There is no tenderness.  Musculoskeletal: Normal range of motion. She exhibits no edema.  Neurological: She is alert and oriented to person, place, and time. No cranial nerve deficit. She exhibits normal muscle tone. Coordination normal.  Normal neuro exam without any focal deficits.  Skin: Skin is warm. No rash noted.  Nursing note and vitals reviewed.   ED Course  Procedures (including  critical care time) Labs Review Labs Reviewed  COMPREHENSIVE METABOLIC PANEL - Abnormal; Notable for the following:    Glucose, Bld 106 (*)    BUN 21 (*)    Calcium 8.8 (*)    ALT 13 (*)    All other components within normal limits  CBG MONITORING, ED - Abnormal; Notable for the following:    Glucose-Capillary 114 (*)    All other components within normal limits  CBC  DIFFERENTIAL  TROPONIN I  CBG MONITORING, ED   Results for orders placed or performed during the hospital encounter of 06/13/16  CBC  Result Value Ref Range   WBC 6.1 4.0 - 10.5 K/uL   RBC 4.41 3.87 - 5.11 MIL/uL   Hemoglobin 12.7 12.0 - 15.0 g/dL   HCT 40.9 81.1 - 91.4 %   MCV 87.8 78.0 - 100.0 fL   MCH 28.8 26.0 - 34.0 pg   MCHC 32.8 30.0 - 36.0 g/dL   RDW 78.2 95.6 - 21.3 %   Platelets 223 150 - 400 K/uL  Differential  Result Value Ref Range   Neutrophils Relative % 48 %   Neutro Abs 2.9 1.7 - 7.7 K/uL   Lymphocytes Relative 38 %   Lymphs Abs 2.3 0.7 - 4.0 K/uL   Monocytes Relative 9 %   Monocytes Absolute 0.6 0.1 - 1.0 K/uL   Eosinophils Relative 5 %   Eosinophils Absolute 0.3 0.0 - 0.7 K/uL   Basophils Relative 0 %   Basophils Absolute 0.0 0.0 - 0.1 K/uL  Comprehensive metabolic panel  Result Value Ref Range   Sodium 137 135 - 145 mmol/L   Potassium 3.5 3.5 - 5.1 mmol/L   Chloride 105 101 - 111 mmol/L   CO2 24 22 - 32 mmol/L   Glucose, Bld 106 (H) 65 - 99 mg/dL   BUN 21 (H) 6 - 20 mg/dL   Creatinine, Ser 1.61 0.44 - 1.00 mg/dL   Calcium 8.8 (L) 8.9 - 10.3 mg/dL   Total Protein 6.8 6.5 - 8.1 g/dL   Albumin 3.8 3.5 - 5.0 g/dL   AST 19 15 - 41 U/L   ALT 13 (L) 14 - 54 U/L   Alkaline Phosphatase 50 38 - 126 U/L   Total Bilirubin 0.4 0.3 - 1.2 mg/dL   GFR calc non Af Amer >60 >60 mL/min   GFR calc Af Amer >60 >60 mL/min   Anion gap 8 5 - 15  Troponin I  Result Value Ref Range   Troponin I <0.03 <0.03 ng/mL  CBG monitoring, ED  Result Value Ref Range   Glucose-Capillary 114 (H) 65 - 99  mg/dL     Imaging Review Ct Head Wo Contrast  06/13/2016  CLINICAL DATA:  Headache. EXAM: CT HEAD WITHOUT CONTRAST TECHNIQUE: Contiguous axial images were obtained from the base of the skull through the vertex without intravenous contrast. COMPARISON:  None. FINDINGS: Bony calvarium appears intact. No mass effect or midline shift is noted. Ventricular size is within normal limits. There is no evidence of mass lesion, hemorrhage or acute infarction. IMPRESSION: Normal head CT. Electronically Signed   By: Lupita Raider, M.D.   On: 06/13/2016 16:06   I have personally reviewed and evaluated these images and lab results as part of my medical decision-making.   EKG Interpretation   Date/Time:  Friday June 13 2016 15:38:38 EDT Ventricular Rate:  64 PR Interval:    QRS Duration: 109 QT Interval:  435 QTC Calculation: 449 R Axis:   -24 Text Interpretation:  Sinus rhythm Borderline left axis deviation Low  voltage, precordial leads RSR' in V1 or V2, right VCD or RVH Borderline T  abnormalities, anterior leads No significant change since last tracing  Confirmed by Corry Ihnen  MD, Dorethy Tomey [phone]) on 06/13/2016 4:11:20 PM      MDM   Final diagnoses:  Acute nonintractable headache, unspecified headache type    Patient with five-day history of right sided headache and pressure in the neck area. Some discomfort in the head area since the onset but it waxes and wanes. Patient had some trouble walking gait seemed to be abnormal    shortly after the onset of the headache. And also patient did have some brief episodes of slurred speech. no upper extremity or lower extremity weakness or numbness. Vision seemed to be a little bit foggy.   Symptoms have been present as stated for the past 5 days. Head CT negative for any acute findings. Patient has a history of migraines although this is different. Patient's headache did improve with migraine cocktail.  MRI not available here today so arranged for  Monday. Patient will follow with primary care doctor for results. Symptoms do not seem to be consistent with acute stroke and based on the duration triggered head CT would show something. MRI needed for further delineation. Also possible symptoms just could be atypical migraine presentation for this patient.    All symptoms resolved with migraine cocktail.   Vanetta Mulders, MD 06/13/16 [phone]

## 2016-06-16 ENCOUNTER — Other Ambulatory Visit: Payer: Self-pay | Admitting: Emergency Medicine

## 2016-06-16 ENCOUNTER — Ambulatory Visit (INDEPENDENT_AMBULATORY_CARE_PROVIDER_SITE_OTHER): Payer: BLUE CROSS/BLUE SHIELD

## 2016-06-16 ENCOUNTER — Other Ambulatory Visit: Payer: BLUE CROSS/BLUE SHIELD

## 2016-06-16 DIAGNOSIS — R519 Headache, unspecified: Secondary | ICD-10-CM

## 2016-06-16 DIAGNOSIS — R4781 Slurred speech: Secondary | ICD-10-CM | POA: Diagnosis not present

## 2016-06-16 DIAGNOSIS — R51 Headache: Secondary | ICD-10-CM | POA: Diagnosis not present

## 2016-06-16 DIAGNOSIS — R5383 Other fatigue: Secondary | ICD-10-CM | POA: Diagnosis not present

## 2016-06-16 DIAGNOSIS — R200 Anesthesia of skin: Secondary | ICD-10-CM | POA: Diagnosis not present

## 2016-06-23 ENCOUNTER — Emergency Department (HOSPITAL_BASED_OUTPATIENT_CLINIC_OR_DEPARTMENT_OTHER)
Admission: EM | Admit: 2016-06-23 | Discharge: 2016-06-23 | Disposition: A | Discharge status: Home / Self Care | Payer: BLUE CROSS/BLUE SHIELD | Attending: Emergency Medicine | Admitting: Emergency Medicine

## 2016-06-23 ENCOUNTER — Encounter (HOSPITAL_BASED_OUTPATIENT_CLINIC_OR_DEPARTMENT_OTHER): Payer: Self-pay | Admitting: *Deleted

## 2016-06-23 DIAGNOSIS — I10 Essential (primary) hypertension: Secondary | ICD-10-CM | POA: Insufficient documentation

## 2016-06-23 DIAGNOSIS — G43909 Migraine, unspecified, not intractable, without status migrainosus: Secondary | ICD-10-CM

## 2016-06-23 DIAGNOSIS — Z79899 Other long term (current) drug therapy: Secondary | ICD-10-CM | POA: Insufficient documentation

## 2016-06-23 MED ORDER — DIPHENHYDRAMINE HCL 25 MG PO CAPS: 25.0000 mg | ORAL_CAPSULE | Freq: Four times a day (QID) | ORAL | 0 refills | Status: DC | PRN

## 2016-06-23 MED ORDER — METOCLOPRAMIDE HCL 10 MG PO TABS: 10.0000 mg | ORAL_TABLET | Freq: Four times a day (QID) | ORAL | 0 refills | Status: DC | PRN

## 2016-06-23 MED ORDER — KETOROLAC TROMETHAMINE 30 MG/ML IJ SOLN
30.0000 mg | Freq: Once | INTRAMUSCULAR | Status: AC
  Administered 2016-06-23: 30 mg via INTRAVENOUS
  Filled 2016-06-23: qty 1

## 2016-06-23 MED ORDER — DIPHENHYDRAMINE HCL 50 MG/ML IJ SOLN
25.0000 mg | Freq: Once | INTRAMUSCULAR | Status: AC
  Administered 2016-06-23: 25 mg via INTRAVENOUS
  Filled 2016-06-23: qty 1

## 2016-06-23 MED ORDER — IBUPROFEN 600 MG PO TABS: 600.0000 mg | ORAL_TABLET | Freq: Four times a day (QID) | ORAL | 0 refills | Status: DC | PRN

## 2016-06-23 MED ORDER — SODIUM CHLORIDE 0.9 % IV SOLN
1000.0000 mL | Freq: Once | INTRAVENOUS | Status: AC
  Administered 2016-06-23: 1000 mL via INTRAVENOUS

## 2016-06-23 MED ORDER — SODIUM CHLORIDE 0.9 % IV SOLN: 1000.0000 mL | INTRAVENOUS | Status: DC

## 2016-06-23 MED ORDER — METOCLOPRAMIDE HCL 5 MG/ML IJ SOLN
10.0000 mg | Freq: Once | INTRAMUSCULAR | Status: AC
  Administered 2016-06-23: 10 mg via INTRAVENOUS
  Filled 2016-06-23: qty 2

## 2016-06-23 NOTE — ED Provider Notes (Signed)
[facility] Provider Note   CSN: [id number] Arrival date & time: 06/23/16  1859  First Provider Contact:  None       History   Chief Complaint Chief Complaint  Patient presents with  . Migraine    HPI Kim Watson is a 45 y.o. female.  HPI Patient had a headache has been coming and going for over a week. She reports she has had an MRI done in the past 4 days and it was negative. She reports that this evening she was at home and the headache came back again in her posterior head and then radiated to the front. She reports it was very bad and she felt very nauseated. She states she also felt sweaty temporarily. She states with her headache she is also chronically getting tingling in her hands. He has not had fevers or chills. She does report light sensitivity to her eyes. She reports that she had migraines for quite a long time ago and at that time was treated with Imitrex. She states she is on many many years without any migraines however. Past Medical History:  Diagnosis Date  . Hypertension   . Migraine     There are no active problems to display for this patient.   Past Surgical History:  Procedure Laterality Date  . CHOLECYSTECTOMY    . SLEEVE GASTROPLASTY    . TUBAL LIGATION      OB History    No data available       Home Medications    Prior to Admission medications   Medication Sig Start Date End Date Taking? Authorizing Provider  acetaminophen (TYLENOL) 325 MG tablet Take 650 mg by mouth every 6 (six) hours as needed for mild pain.     Historical Provider, MD  albuterol (PROVENTIL HFA;VENTOLIN HFA) 108 (90 BASE) MCG/ACT inhaler Inhale 2 puffs into the lungs every 4 (four) hours as needed for wheezing or shortness of breath. 10/29/11 08/08/15  Trixie Dredge, PA-C  ALPRAZolam Prudy Feeler) 0.5 MG tablet Take 0.5 mg by mouth at bedtime as needed for anxiety.    Historical Provider, MD  azithromycin (ZITHROMAX) 250 MG tablet Take 1 tablet (250 mg total) by  mouth daily. Take first 2 tablets together, then 1 every day until finished. Patient not taking: Reported on 01/07/2015 12/02/13   Marlon Pel, PA-C  benzonatate (TESSALON) 100 MG capsule Take 1 capsule (100 mg total) by mouth every 8 (eight) hours. Patient not taking: Reported on 01/07/2015 12/02/13   Marlon Pel, PA-C  Biotin 5000 MCG TABS Take 1 tablet by mouth daily.    Historical Provider, MD  cholecalciferol (VITAMIN D) 1000 UNITS tablet Take 1,000 Units by mouth daily.    Historical Provider, MD  citalopram (CELEXA) 20 MG tablet Take 20 mg by mouth daily.    Historical Provider, MD  diphenhydrAMINE (BENADRYL) 25 mg capsule Take 1 capsule (25 mg total) by mouth every 6 (six) hours as needed (Take with Reglan and ibuprofen for migraine headache.). 06/23/16   Arby Barrette, MD  HYDROcodone-acetaminophen (NORCO) 5-325 MG per tablet Take 1 tablet by mouth every 6 (six) hours as needed for severe pain. Patient not taking: Reported on 08/08/2015 01/07/15   Mercedes Camprubi-Soms, PA-C  ibuprofen (ADVIL,MOTRIN) 600 MG tablet Take 1 tablet (600 mg total) by mouth every 6 (six) hours as needed. Patient not taking: Reported on 01/07/2015 09/20/14   Shon Baton, MD  ibuprofen (ADVIL,MOTRIN) 600 MG tablet Take 1 tablet (600 mg total) by  mouth every 6 (six) hours as needed. 06/23/16   Arby Barrette, MD  metoCLOPramide (REGLAN) 10 MG tablet Take 1 tablet (10 mg total) by mouth every 6 (six) hours as needed for nausea (nausea/headache). 08/09/15   Loren Racer, MD  metoCLOPramide (REGLAN) 10 MG tablet Take 1 tablet (10 mg total) by mouth every 6 (six) hours as needed for nausea (As needed for migraine, take with Benadryl 25 mg and ibuprofen 600 mg.). 06/23/16   Arby Barrette, MD  Multiple Vitamin (MULTIVITAMIN WITH MINERALS) TABS tablet Take 1 tablet by mouth daily.    Historical Provider, MD  omeprazole (PRILOSEC) 20 MG capsule Take 1 capsule (20 mg total) by mouth daily. x2 weeks Patient not taking:  Reported on 08/08/2015 01/07/15   Mercedes Camprubi-Soms, PA-C  ondansetron (ZOFRAN) 4 MG tablet Take 1 tablet (4 mg total) by mouth every 8 (eight) hours as needed for nausea or vomiting. Patient not taking: Reported on 01/07/2015 09/20/14   Shon Baton, MD  promethazine (PHENERGAN) 25 MG suppository Place 1 suppository (25 mg total) rectally every 6 (six) hours as needed for nausea or vomiting. Patient not taking: Reported on 08/08/2015 01/07/15   Mercedes Camprubi-Soms, PA-C  PROPRANOLOL HCL PO Take 1 tablet by mouth daily.    Historical Provider, MD    Family History Family History  Problem Relation Age of Onset  . Diabetes Other   . Hyperlipidemia Other   . Hypertension Other     Social History Social History  Substance Use Topics  . Smoking status: Never Smoker  . Smokeless tobacco: Not on file  . Alcohol use Yes     Comment: socially     Allergies   Percocet [oxycodone-acetaminophen]   Review of Systems Review of Systems 10 Systems reviewed and are negative for acute change except as noted in the HPI.  Physical Exam Updated Vital Signs BP 128/85 (BP Location: Right Arm)   Pulse 60   Temp 98.1 F (36.7 C) (Oral)   Resp 18   Ht 5' 6.5" (1.689 m)   Wt 235 lb (106.6 kg)   LMP 05/24/2016   SpO2 99%   BMI 37.36 kg/m   Physical Exam  Constitutional: She is oriented to person, place, and time. She appears well-developed and well-nourished. No distress.  HENT:  Head: Normocephalic and atraumatic.  Nose: Nose normal.  Mouth/Throat: Oropharynx is clear and moist.  Eyes: EOM are normal. Pupils are equal, round, and reactive to light.  Neck: Neck supple.  Cardiovascular: Normal rate, regular rhythm and normal heart sounds.   Pulmonary/Chest: Effort normal and breath sounds normal.  Abdominal: Soft. She exhibits no distension. There is no tenderness.  Musculoskeletal: Normal range of motion.  Neurological: She is alert and oriented to person, place, and time. No  cranial nerve deficit. She exhibits normal muscle tone. Coordination normal.  Skin: Skin is warm and dry.  Psychiatric: She has a normal mood and affect.     ED Treatments / Results  Labs (all labs ordered are listed, but only abnormal results are displayed) Labs Reviewed - No data to display  EKG  EKG Interpretation None       Radiology No results found.  Procedures Procedures (including critical care time)  Medications Ordered in ED Medications  0.9 %  sodium chloride infusion (1,000 mLs Intravenous New Bag/Given 06/23/16 2029)    Followed by  0.9 %  sodium chloride infusion (not administered)  ketorolac (TORADOL) 30 MG/ML injection 30 mg (30 mg Intravenous Given  06/23/16 2035)  diphenhydrAMINE (BENADRYL) injection 25 mg (25 mg Intravenous Given 06/23/16 2031)  metoCLOPramide (REGLAN) injection 10 mg (10 mg Intravenous Given 06/23/16 2034)     Initial Impression / Assessment and Plan / ED Course  I have reviewed the triage vital signs and the nursing notes.  Pertinent labs & imaging results that were available during my care of the patient were reviewed by me and considered in my medical decision making (see chart for details).  Clinical Course   Recheck: After treatment with Reglan, Benadryl, Toradol and fluids, patient reported her headache completely resolved.  Final Clinical Impressions(s) / ED Diagnoses   Final diagnoses:  Migraine without status migrainosus, not intractable, unspecified migraine type  Patient has a prior history of migraine headaches which had resolved for many years. She has began having headaches again. She does not have any neurologic deficit or signs of infectious illness. MRI does not identify any acute findings. She does identify light sensitivity and nausea. This headache does sound typical for a migraine and has completely resolved with migraine therapy. Patient is advised to follow-up with her family doctor and discuss migraine therapy and  possible referral to neurology.  New Prescriptions New Prescriptions   DIPHENHYDRAMINE (BENADRYL) 25 MG CAPSULE    Take 1 capsule (25 mg total) by mouth every 6 (six) hours as needed (Take with Reglan and ibuprofen for migraine headache.).   IBUPROFEN (ADVIL,MOTRIN) 600 MG TABLET    Take 1 tablet (600 mg total) by mouth every 6 (six) hours as needed.   METOCLOPRAMIDE (REGLAN) 10 MG TABLET    Take 1 tablet (10 mg total) by mouth every 6 (six) hours as needed for nausea (As needed for migraine, take with Benadryl 25 mg and ibuprofen 600 mg.).     Arby Barrette, MD 06/23/16 2206

## 2016-06-23 NOTE — ED Triage Notes (Signed)
Pt c/o " migraine" x 2 hrs with n/v

## 2016-06-23 NOTE — ED Notes (Signed)
MD at bedside to evaluate pt at this time. Awaiting MD orders.

## 2017-01-02 DIAGNOSIS — F331 Major depressive disorder, recurrent, moderate: Secondary | ICD-10-CM | POA: Insufficient documentation

## 2017-03-13 ENCOUNTER — Other Ambulatory Visit (HOSPITAL_COMMUNITY): Payer: Self-pay | Admitting: General Surgery

## 2017-03-13 DIAGNOSIS — R1013 Epigastric pain: Secondary | ICD-10-CM

## 2017-03-30 ENCOUNTER — Ambulatory Visit (HOSPITAL_COMMUNITY)
Admission: RE | Admit: 2017-03-30 | Discharge: 2017-03-30 | Disposition: A | Discharge status: Home / Self Care | Payer: BLUE CROSS/BLUE SHIELD | Source: Ambulatory Visit | Attending: General Surgery | Admitting: General Surgery

## 2017-03-30 DIAGNOSIS — Z9884 Bariatric surgery status: Secondary | ICD-10-CM | POA: Insufficient documentation

## 2017-03-30 DIAGNOSIS — R1013 Epigastric pain: Secondary | ICD-10-CM

## 2017-03-30 DIAGNOSIS — K219 Gastro-esophageal reflux disease without esophagitis: Secondary | ICD-10-CM | POA: Insufficient documentation

## 2017-03-31 ENCOUNTER — Ambulatory Visit (HOSPITAL_COMMUNITY): Payer: Medicaid Other

## 2017-04-29 ENCOUNTER — Ambulatory Visit (HOSPITAL_COMMUNITY): Payer: BLUE CROSS/BLUE SHIELD | Admitting: Psychiatry

## 2017-06-11 ENCOUNTER — Encounter (INDEPENDENT_AMBULATORY_CARE_PROVIDER_SITE_OTHER): Payer: Self-pay | Admitting: Family Medicine

## 2017-06-18 ENCOUNTER — Encounter (INDEPENDENT_AMBULATORY_CARE_PROVIDER_SITE_OTHER): Payer: Self-pay | Admitting: Family Medicine

## 2017-06-18 ENCOUNTER — Ambulatory Visit (INDEPENDENT_AMBULATORY_CARE_PROVIDER_SITE_OTHER): Payer: BLUE CROSS/BLUE SHIELD | Admitting: Family Medicine

## 2017-06-18 VITALS — BP 118/79 | HR 67 | Temp 97.8°F | Ht 66.0 in | Wt 253.0 lb

## 2017-06-18 DIAGNOSIS — Z0289 Encounter for other administrative examinations: Secondary | ICD-10-CM

## 2017-06-18 DIAGNOSIS — Z1389 Encounter for screening for other disorder: Secondary | ICD-10-CM | POA: Diagnosis not present

## 2017-06-18 DIAGNOSIS — E669 Obesity, unspecified: Secondary | ICD-10-CM | POA: Diagnosis not present

## 2017-06-18 DIAGNOSIS — E559 Vitamin D deficiency, unspecified: Secondary | ICD-10-CM

## 2017-06-18 DIAGNOSIS — Z6841 Body Mass Index (BMI) 40.0 and over, adult: Secondary | ICD-10-CM

## 2017-06-18 DIAGNOSIS — R0602 Shortness of breath: Secondary | ICD-10-CM | POA: Diagnosis not present

## 2017-06-18 DIAGNOSIS — Z1331 Encounter for screening for depression: Secondary | ICD-10-CM

## 2017-06-18 DIAGNOSIS — R5383 Other fatigue: Secondary | ICD-10-CM

## 2017-06-18 DIAGNOSIS — IMO0001 Reserved for inherently not codable concepts without codable children: Secondary | ICD-10-CM

## 2017-06-18 NOTE — Progress Notes (Signed)
Office: [phone]  /  Fax: [phone]   Dear Dr. Andrey Campanile,   Thank you for referring Kim Watson to our clinic. The following note includes my evaluation and treatment recommendations.  HPI:   Chief Complaint: OBESITY  Kim Watson has been referred by Kim Watson. Andrey Campanile, MD for consultation regarding her obesity and obesity related comorbidities.  Kim Watson (MR# [id number]) is a 46 y.o. female who presents on 06/18/2017 for obesity evaluation and treatment. Current BMI is Body mass index is 40.84 kg/m. Shawan is status post Gastric Sleeve in 2015 with Dr. Clent Ridges. Kim Watson lost 40 lbs but has gained most of it back. Kim Watson states Kim Watson doesn't eat much and is frustrated that Kim Watson isn't losing weight. Kim Watson has struggled with obesity for years and has been unsuccessful in either losing weight or maintaining long term weight loss. Kim Watson attended our information session and states Kim Watson is currently in the action stage of change and ready to dedicate time achieving and maintaining a healthier weight.  Kim Watson states her family eats meals together Kim Watson thinks her family will eat healthier with  her her desired weight loss is 77 lbs Kim Watson has been heavy most of  her life Kim Watson started gaining weight as a kid her heaviest weight ever was 280 lbs. Kim Watson skips meals frequently Kim Watson frequently eats larger portions than normal  Kim Watson has binge eating behaviors Kim Watson struggles with emotional eating    Fatigue Quorra feels her energy is lower than it should be. This has worsened with weight gain and has not worsened recently. Pavielle admits to daytime somnolence and  admits to waking up still tired. Patient is at risk for obstructive sleep apnea. Patent has a history of symptoms of daytime fatigue and morning fatigue. Patient generally gets 5 hours of sleep per night, and states they generally have restless sleep. Snoring is not present. Apneic episodes are not present. Epworth Sleepiness Score is 19  Dyspnea on  exertion Kim Watson notes increasing shortness of breath with exercising and seems to be worsening over time with weight gain. Kim Watson notes getting out of breath sooner with activity than Kim Watson used to. This has not gotten worse recently. Kim Watson denies orthopnea.  Vitamin D deficiency Kim Watson has a diagnosis of vitamin D deficiency. Kim Watson is currently taking OTC vit D and still notes fatigue. Kim Watson denies nausea, vomiting or muscle weakness.  Depression Screen Kim Watson's Food and Mood (modified PHQ-9) score was  Depression screen PHQ 2/9 06/18/2017  Decreased Interest 3  Down, Depressed, Hopeless 3  PHQ - 2 Score 6  Altered sleeping 2  Tired, decreased energy 3  Change in appetite 3  Feeling bad or failure about yourself  2  Trouble concentrating 1  Moving slowly or fidgety/restless 0  Suicidal thoughts 0  PHQ-9 Score 17    ALLERGIES: Allergies  Allergen Reactions   Percocet [Oxycodone-Acetaminophen]     Chills, nausea    MEDICATIONS: Current Outpatient Prescriptions on File Prior to Visit  Medication Sig Dispense Refill   acetaminophen (TYLENOL) 325 MG tablet Take 650 mg by mouth every 6 (six) hours as needed for mild pain.      cholecalciferol (VITAMIN D) 1000 UNITS tablet Take 1,000 Units by mouth daily.     albuterol (PROVENTIL HFA;VENTOLIN HFA) 108 (90 BASE) MCG/ACT inhaler Inhale 2 puffs into the lungs every 4 (four) hours as needed for wheezing or shortness of breath. 1 Inhaler 0   ALPRAZolam (XANAX) 0.5 MG tablet Take 0.5 mg by mouth at  bedtime as needed for anxiety.     azithromycin (ZITHROMAX) 250 MG tablet Take 1 tablet (250 mg total) by mouth daily. Take first 2 tablets together, then 1 every day until finished. (Patient not taking: Reported on 01/07/2015) 6 tablet 0   benzonatate (TESSALON) 100 MG capsule Take 1 capsule (100 mg total) by mouth every 8 (eight) hours. (Patient not taking: Reported on 01/07/2015) 21 capsule 0   Biotin 5000 MCG TABS Take 1 tablet by mouth daily.      citalopram (CELEXA) 20 MG tablet Take 20 mg by mouth daily.     diphenhydrAMINE (BENADRYL) 25 mg capsule Take 1 capsule (25 mg total) by mouth every 6 (six) hours as needed (Take with Reglan and ibuprofen for migraine headache.). (Patient not taking: Reported on 06/18/2017) 30 capsule 0   HYDROcodone-acetaminophen (NORCO) 5-325 MG per tablet Take 1 tablet by mouth every 6 (six) hours as needed for severe pain. (Patient not taking: Reported on 08/08/2015) 6 tablet 0   ibuprofen (ADVIL,MOTRIN) 600 MG tablet Take 1 tablet (600 mg total) by mouth every 6 (six) hours as needed. (Patient not taking: Reported on 01/07/2015) 30 tablet 0   ibuprofen (ADVIL,MOTRIN) 600 MG tablet Take 1 tablet (600 mg total) by mouth every 6 (six) hours as needed. (Patient not taking: Reported on 06/18/2017) 30 tablet 0   metoCLOPramide (REGLAN) 10 MG tablet Take 1 tablet (10 mg total) by mouth every 6 (six) hours as needed for nausea (nausea/headache). (Patient not taking: Reported on 06/18/2017) 6 tablet 0   metoCLOPramide (REGLAN) 10 MG tablet Take 1 tablet (10 mg total) by mouth every 6 (six) hours as needed for nausea (As needed for migraine, take with Benadryl 25 mg and ibuprofen 600 mg.). (Patient not taking: Reported on 06/18/2017) 30 tablet 0   Multiple Vitamin (MULTIVITAMIN WITH MINERALS) TABS tablet Take 1 tablet by mouth daily.     omeprazole (PRILOSEC) 20 MG capsule Take 1 capsule (20 mg total) by mouth daily. x2 weeks (Patient not taking: Reported on 08/08/2015) 14 capsule 0   ondansetron (ZOFRAN) 4 MG tablet Take 1 tablet (4 mg total) by mouth every 8 (eight) hours as needed for nausea or vomiting. (Patient not taking: Reported on 01/07/2015) 20 tablet 0   promethazine (PHENERGAN) 25 MG suppository Place 1 suppository (25 mg total) rectally every 6 (six) hours as needed for nausea or vomiting. (Patient not taking: Reported on 08/08/2015) 15 suppository 1   PROPRANOLOL HCL PO Take 1 tablet by mouth daily.     No current  facility-administered medications on file prior to visit.     PAST MEDICAL HISTORY: Past Medical History:  Diagnosis Date   Anxiety    Arthritis    Depression    Gallbladder problem    GERD (gastroesophageal reflux disease)    High blood pressure    Hypertension    Migraine    Obesity    Obstructive sleep apnea    Vitamin D deficiency     PAST SURGICAL HISTORY: Past Surgical History:  Procedure Laterality Date   CHOLECYSTECTOMY     SLEEVE GASTROPLASTY     TUBAL LIGATION      SOCIAL HISTORY: Social History  Substance Use Topics   Smoking status: Never Smoker   Smokeless tobacco: Never Used   Alcohol use Yes     Comment: socially    FAMILY HISTORY: Family History  Problem Relation Age of Onset   Diabetes Other    Hyperlipidemia Other  Hypertension Other     ROS: Review of Systems  Constitutional: Positive for malaise/fatigue.  HENT: Positive for hearing loss and tinnitus.        Dry Mouth Large Tonsils  Eyes:       Wear Glasses or Contacts Floaters  Respiratory: Positive for shortness of breath (with activity).   Cardiovascular: Negative for orthopnea.       Sudden Awakening from Sleep with Shortness of Breath  Gastrointestinal: Positive for heartburn. Negative for nausea and vomiting.       Swallowing Difficulty  Musculoskeletal:       Neck Stiffness Muscle Stiffness Negative muscle weakness  Skin: Positive for rash.       Hair or Nail Changes  Neurological: Positive for headaches.  Endo/Heme/Allergies: Positive for polydipsia.  Psychiatric/Behavioral: Positive for depression. The patient has insomnia.     PHYSICAL EXAM: Blood pressure 118/79, pulse 67, temperature 97.8 F (36.6 C), temperature source Oral, height 5\' 6"  (1.676 m), weight 253 lb (114.8 kg), SpO2 99 %. Body mass index is 40.84 kg/m. Physical Exam  Constitutional: Kim Watson is oriented to person, place, and time. Kim Watson appears well-developed and well-nourished.    Cardiovascular: Normal rate.   Pulmonary/Chest: Effort normal.  Musculoskeletal: Normal range of motion.  Neurological: Kim Watson is oriented to person, place, and time.  Skin: Skin is warm and dry.  Psychiatric: Kim Watson has a normal mood and affect. Her behavior is normal.  Vitals reviewed.   RECENT LABS AND TESTS: BMET    Component Value Date/Time   NA 137 06/13/2016 1540   K 3.5 06/13/2016 1540   CL 105 06/13/2016 1540   CO2 24 06/13/2016 1540   GLUCOSE 106 (H) 06/13/2016 1540   BUN 21 (H) 06/13/2016 1540   CREATININE 0.82 06/13/2016 1540   CALCIUM 8.8 (L) 06/13/2016 1540   GFRNONAA >60 06/13/2016 1540   GFRAA >60 06/13/2016 1540   No results found for: HGBA1C No results found for: INSULIN CBC    Component Value Date/Time   WBC 6.1 06/13/2016 1540   RBC 4.41 06/13/2016 1540   HGB 12.7 06/13/2016 1540   HCT 38.7 06/13/2016 1540   PLT 223 06/13/2016 1540   MCV 87.8 06/13/2016 1540   MCH 28.8 06/13/2016 1540   MCHC 32.8 06/13/2016 1540   RDW 12.9 06/13/2016 1540   LYMPHSABS 2.3 06/13/2016 1540   MONOABS 0.6 06/13/2016 1540   EOSABS 0.3 06/13/2016 1540   BASOSABS 0.0 06/13/2016 1540   Iron/TIBC/Ferritin/ %Sat No results found for: IRON, TIBC, FERRITIN, IRONPCTSAT Lipid Panel  No results found for: CHOL, TRIG, HDL, CHOLHDL, VLDL, LDLCALC, LDLDIRECT Hepatic Function Panel     Component Value Date/Time   PROT 6.8 06/13/2016 1540   ALBUMIN 3.8 06/13/2016 1540   AST 19 06/13/2016 1540   ALT 13 (L) 06/13/2016 1540   ALKPHOS 50 06/13/2016 1540   BILITOT 0.4 06/13/2016 1540   No results found for: TSH  ECG  shows NSR with a rate of 67 BPM INDIRECT CALORIMETER done today shows a VO2 of 245 and a REE of 1709.    ASSESSMENT AND PLAN: Other fatigue - Plan: EKG 12-Lead, Vitamin B12, CBC With Differential, Comprehensive metabolic panel, Folate, Hemoglobin A1c, Insulin, random, Lipid Panel With LDL/HDL Ratio, T3, T4, free, TSH  Shortness of breath on exertion  Vitamin D  deficiency - Plan: VITAMIN D 25 Hydroxy (Vit-D Deficiency, Fractures)  Depression screening  Class 3 obesity with serious comorbidity and body mass index (BMI) of 40.0 to 44.9 in  adult, unspecified obesity type [facility])  PLAN: Fatigue Melda was informed that her fatigue may be related to obesity, depression or many other causes. Labs will be ordered, and in the meanwhile Sonakshi has agreed to work on diet, exercise and weight loss to help with fatigue. Proper sleep hygiene was discussed including the need for 7-8 hours of quality sleep each night. A sleep study was not ordered yet.  Dyspnea on exertion Nasiah's shortness of breath appears to be obesity related and exercise induced. Kim Watson has agreed to work on weight loss and gradually increase exercise to treat her exercise induced shortness of breath. If Lilliann follows our instructions and loses weight without improvement of her shortness of breath, we will plan to refer to pulmonology. We will monitor this condition regularly. Bernarda agrees to this plan.  Vitamin D Deficiency Stepheny was informed that low vitamin D levels contributes to fatigue and are associated with obesity, breast, and colon cancer. Kim Watson agrees to continue to take OTC Vit D and we will check labs and will follow up for routine testing of vitamin D, at least 2-3 times per year. Kim Watson was informed of the risk of over-replacement of vitamin D and agrees to not increase her dose unless he discusses this with [place] first. Suan agrees to follow up with our clinic in 2 weeks.  Depression Screen Aoife had a strongly positive depression screening. Depression is commonly associated with obesity and often results in emotional eating behaviors. We will monitor this closely and work on CBT to help improve the non-hunger eating patterns. Referral to Psychology may be required if no improvement is seen as Kim Watson continues in our clinic.  Obesity Rajean is currently in the action stage of change and her goal  is to continue with weight loss efforts. I recommend Moncia begin the structured treatment plan as follows:  Kim Watson has agreed to follow the Category 2 plan Maricia has been instructed to eventually work up to a goal of 150 minutes of combined cardio and strengthening exercise per week for weight loss and overall health benefits. We discussed the following Behavioral Modification Strategies today: no skipping meals, increasing lean protein intake and decrease eating out  Eilis has agreed to join our obesity program and follow up with our clinic in 2 weeks. Kim Watson was informed of the importance of frequent follow up visits to maximize her success with intensive lifestyle modifications for her multiple health conditions. Kim Watson was informed we would discuss her lab results at her next visit unless there is a critical issue that needs to be addressed sooner. Naveyah agreed to keep her next visit at the agreed upon time to discuss these results.  I, Nevada Crane, am acting as transcriptionist for Quillian Quince, MD  I have reviewed the above documentation for accuracy and completeness, and I agree with the above. -Quillian Quince, MD    OBESITY BEHAVIORAL INTERVENTION VISIT  Today's visit was # 1 out of 22.  Starting weight: 253 lbs Starting date: 06/18/17 Today's weight : 253 lbs Today's date: 06/18/2017 Total lbs lost to date: 0 (Patients must lose 7 lbs in the first 6 months to continue with counseling)   ASK: We discussed the diagnosis of obesity with Kim Watson today and Caree agreed to give [place] permission to discuss obesity behavioral modification therapy today.  ASSESS: Steffani has the diagnosis of obesity and her BMI today is 22.9 Nuriya is in the action stage of change   ADVISE: Maaliyah was educated on the  multiple health risks of obesity as well as the benefit of weight loss to improve her health. Kim Watson was advised of the need for long term treatment and the importance of lifestyle  modifications.  AGREE: Multiple dietary modification options and treatment options were discussed and  Blaise agreed to follow the Category 2 plan We discussed the following Behavioral Modification Strategies today: no skipping meals, increasing lean protein intake and decrease eating out

## 2017-06-25 LAB — LIPID PANEL WITH LDL/HDL RATIO
CHOLESTEROL TOTAL: 207 mg/dL — AB (ref 100–199)
HDL: 52 mg/dL (ref >39)
LDL Calculated: 128 mg/dL — ABNORMAL HIGH (ref 0–99)
LDl/HDL Ratio: 2.5 ratio (ref 0.0–3.2)
Triglycerides: 137 mg/dL (ref 0–149)
VLDL CHOLESTEROL CAL: 27 mg/dL (ref 5–40)

## 2017-06-25 LAB — COMPREHENSIVE METABOLIC PANEL
ALBUMIN: 4.3 g/dL (ref 3.5–5.5)
ALT: 12 IU/L (ref 0–32)
AST: 26 IU/L (ref 0–40)
Albumin/Globulin Ratio: 1.4 (ref 1.2–2.2)
Alkaline Phosphatase: 70 IU/L (ref 39–117)
BUN / CREAT RATIO: 19 (ref 9–23)
BUN: 16 mg/dL (ref 6–24)
Bilirubin Total: 0.3 mg/dL (ref 0.0–1.2)
CO2: 20 mmol/L (ref 20–29)
CREATININE: 0.83 mg/dL (ref 0.57–1.00)
Calcium: 9.1 mg/dL (ref 8.7–10.2)
Chloride: 102 mmol/L (ref 96–106)
GFR calc non Af Amer: 85 mL/min/{1.73_m2} (ref >59)
GFR, EST AFRICAN AMERICAN: 98 mL/min/{1.73_m2} (ref >59)
GLUCOSE: 94 mg/dL (ref 65–99)
Globulin, Total: 3.1 g/dL (ref 1.5–4.5)
Potassium: 4.7 mmol/L (ref 3.5–5.2)
Sodium: 137 mmol/L (ref 134–144)
TOTAL PROTEIN: 7.4 g/dL (ref 6.0–8.5)

## 2017-06-25 LAB — T4, FREE: FREE T4: 1.08 ng/dL (ref 0.82–1.77)

## 2017-06-25 LAB — VITAMIN D 25 HYDROXY (VIT D DEFICIENCY, FRACTURES): Vit D, 25-Hydroxy: 30.4 ng/mL (ref 30.0–100.0)

## 2017-06-25 LAB — HEMOGLOBIN A1C
Est. average glucose Bld gHb Est-mCnc: 105 mg/dL
Hgb A1c MFr Bld: 5.3 % (ref 4.8–5.6)

## 2017-06-25 LAB — T3: T3, Total: 96 ng/dL (ref 71–180)

## 2017-06-25 LAB — CBC WITH DIFFERENTIAL
BASOS ABS: 0 10*3/uL (ref 0.0–0.2)
Basos: 0 %
EOS (ABSOLUTE): 0.1 10*3/uL (ref 0.0–0.4)
Eos: 1 %
HEMOGLOBIN: 12.9 g/dL (ref 11.1–15.9)
Hematocrit: 39.9 % (ref 34.0–46.6)
IMMATURE GRANS (ABS): 0 10*3/uL (ref 0.0–0.1)
IMMATURE GRANULOCYTES: 0 %
LYMPHS: 40 %
Lymphocytes Absolute: 2 10*3/uL (ref 0.7–3.1)
MCH: 27.6 pg (ref 26.6–33.0)
MCHC: 32.3 g/dL (ref 31.5–35.7)
MCV: 85 fL (ref 79–97)
MONOCYTES: 6 %
Monocytes Absolute: 0.3 10*3/uL (ref 0.1–0.9)
NEUTROS ABS: 2.7 10*3/uL (ref 1.4–7.0)
Neutrophils: 53 %
RBC: 4.67 x10E6/uL (ref 3.77–5.28)
RDW: 14.8 % (ref 12.3–15.4)
WBC: 5 10*3/uL (ref 3.4–10.8)

## 2017-06-25 LAB — INSULIN, RANDOM: INSULIN: 10 u[IU]/mL (ref 2.6–24.9)

## 2017-06-25 LAB — FOLATE

## 2017-06-25 LAB — TSH: TSH: 2.06 u[IU]/mL (ref 0.450–4.500)

## 2017-06-25 LAB — VITAMIN B12: VITAMIN B 12: 402 pg/mL (ref 232–1245)

## 2017-07-02 ENCOUNTER — Ambulatory Visit (INDEPENDENT_AMBULATORY_CARE_PROVIDER_SITE_OTHER): Payer: BLUE CROSS/BLUE SHIELD | Admitting: Family Medicine

## 2017-07-02 VITALS — BP 110/71 | HR 62 | Temp 98.3°F | Ht 66.0 in | Wt 253.0 lb

## 2017-07-02 DIAGNOSIS — E784 Other hyperlipidemia: Secondary | ICD-10-CM

## 2017-07-02 DIAGNOSIS — E559 Vitamin D deficiency, unspecified: Secondary | ICD-10-CM | POA: Diagnosis not present

## 2017-07-02 DIAGNOSIS — Z6841 Body Mass Index (BMI) 40.0 and over, adult: Secondary | ICD-10-CM | POA: Diagnosis not present

## 2017-07-02 DIAGNOSIS — E669 Obesity, unspecified: Secondary | ICD-10-CM

## 2017-07-02 DIAGNOSIS — IMO0001 Reserved for inherently not codable concepts without codable children: Secondary | ICD-10-CM

## 2017-07-02 DIAGNOSIS — E8881 Metabolic syndrome: Secondary | ICD-10-CM | POA: Diagnosis not present

## 2017-07-02 DIAGNOSIS — Z9189 Other specified personal risk factors, not elsewhere classified: Secondary | ICD-10-CM | POA: Diagnosis not present

## 2017-07-02 DIAGNOSIS — E7849 Other hyperlipidemia: Secondary | ICD-10-CM | POA: Insufficient documentation

## 2017-07-02 MED ORDER — VITAMIN D (ERGOCALCIFEROL) 1.25 MG (50000 UNIT) PO CAPS: 50000.0000 [IU] | ORAL_CAPSULE | ORAL | 0 refills | Status: DC

## 2017-07-02 MED ORDER — METFORMIN HCL 500 MG PO TABS: 500.0000 mg | ORAL_TABLET | Freq: Every morning | ORAL | 0 refills | Status: DC

## 2017-07-02 NOTE — Progress Notes (Signed)
Office: [phone]  /  Fax: [phone]   HPI:   Chief Complaint: OBESITY Kim Watson is here to discuss her progress with her obesity treatment plan. She is on the Category 2 plan and is following her eating plan approximately 93 % of the time. She states she is exercising 0 minutes 0 times per week. Kim Watson is retaining fluid today and the BI scale suggests she has lost 2 to 3 pounds of fat. She did well following the category 2 plan but had to spread her food out over longer periods. Kim Watson is status post Gastric Sleeve Her weight is 253 lb (114.8 kg) today and has maintained weight over a period of 2 weeks since her last visit. She has lost 0 lbs since starting treatment with us.  Vitamin D deficiency Kim Watson has a diagnosis of vitamin D deficiency. She is currently taking OTC vit D 10,000 IU daily and is not yet at goal. Kim Watson denies nausea, vomiting or muscle weakness.  Hyperlipidemia Kim Watson has hyperlipidemia, LDL is elevated but HDL and triglycerides are within normal limits. She would like to improve her cholesterol levels with intensive lifestyle modification including a low saturated fat diet, exercise and weight loss. She denies any chest pain, claudication or myalgias.  Insulin Resistance Kim Watson was insulin resistant before her gastric sleeve surgery. Although Kim Watson's blood glucose readings are still under good control, insulin resistance puts her at greater risk of metabolic syndrome and diabetes. She is not taking metformin currently and she admits polyphagia. Kim Watson continues to work on diet and exercise to decrease risk of diabetes.  ALLERGIES: Allergies  Allergen Reactions  . Percocet [Oxycodone-Acetaminophen]     Chills, nausea    MEDICATIONS: Current Outpatient Prescriptions on File Prior to Visit  Medication Sig Dispense Refill  . acetaminophen (TYLENOL) 325 MG tablet Take 650 mg by mouth every 6 (six) hours as needed for mild pain.     Marland Kitchen. ALPRAZolam (XANAX) 0.5 MG tablet  Take 0.5 mg by mouth at bedtime as needed for anxiety.    Marland Kitchen. azithromycin (ZITHROMAX) 250 MG tablet Take 1 tablet (250 mg total) by mouth daily. Take first 2 tablets together, then 1 every day until finished. 6 tablet 0  . benzonatate (TESSALON) 100 MG capsule Take 1 capsule (100 mg total) by mouth every 8 (eight) hours. 21 capsule 0  . Biotin 5000 MCG TABS Take 1 tablet by mouth daily.    . busPIRone (BUSPAR) 10 MG tablet Take 10 mg by mouth 3 (three) times daily.    . cholecalciferol (VITAMIN D) 1000 UNITS tablet Take 1,000 Units by mouth daily.    . citalopram (CELEXA) 20 MG tablet Take 20 mg by mouth daily.    . clonazePAM (KLONOPIN) 0.5 MG tablet Take 0.5 mg by mouth 2 (two) times daily as needed for anxiety.    . diphenhydrAMINE (BENADRYL) 25 mg capsule Take 1 capsule (25 mg total) by mouth every 6 (six) hours as needed (Take with Reglan and ibuprofen for migraine headache.). 30 capsule 0  . escitalopram (LEXAPRO) 10 MG tablet Take 10 mg by mouth daily.    Marland Kitchen. HYDROcodone-acetaminophen (NORCO) 5-325 MG per tablet Take 1 tablet by mouth every 6 (six) hours as needed for severe pain. 6 tablet 0  . ibuprofen (ADVIL,MOTRIN) 600 MG tablet Take 1 tablet (600 mg total) by mouth every 6 (six) hours as needed. 30 tablet 0  . ibuprofen (ADVIL,MOTRIN) 600 MG tablet Take 1 tablet (600 mg total) by mouth every 6 (six)  hours as needed. 30 tablet 0  . metoCLOPramide (REGLAN) 10 MG tablet Take 1 tablet (10 mg total) by mouth every 6 (six) hours as needed for nausea (nausea/headache). 6 tablet 0  . metoCLOPramide (REGLAN) 10 MG tablet Take 1 tablet (10 mg total) by mouth every 6 (six) hours as needed for nausea (As needed for migraine, take with Benadryl 25 mg and ibuprofen 600 mg.). 30 tablet 0  . Multiple Vitamin (MULTIVITAMIN WITH MINERALS) TABS tablet Take 1 tablet by mouth daily.    Marland Kitchen. omeprazole (PRILOSEC) 20 MG capsule Take 1 capsule (20 mg total) by mouth daily. x2 weeks 14 capsule 0  . ondansetron  (ZOFRAN) 4 MG tablet Take 1 tablet (4 mg total) by mouth every 8 (eight) hours as needed for nausea or vomiting. 20 tablet 0  . pantoprazole (PROTONIX) 40 MG tablet Take 40 mg by mouth daily.    . promethazine (PHENERGAN) 25 MG suppository Place 1 suppository (25 mg total) rectally every 6 (six) hours as needed for nausea or vomiting. 15 suppository 1  . PROPRANOLOL HCL PO Take 1 tablet by mouth daily.    Marland Kitchen. topiramate (TOPAMAX) 25 MG capsule Take 25 mg by mouth 2 (two) times daily.    . valACYclovir (VALTREX) 500 MG tablet Take 500 mg by mouth 2 (two) times daily.    Marland Kitchen. albuterol (PROVENTIL HFA;VENTOLIN HFA) 108 (90 BASE) MCG/ACT inhaler Inhale 2 puffs into the lungs every 4 (four) hours as needed for wheezing or shortness of breath. 1 Inhaler 0   No current facility-administered medications on file prior to visit.     PAST MEDICAL HISTORY: Past Medical History:  Diagnosis Date  . Anxiety   . Arthritis   . Depression   . Gallbladder problem   . GERD (gastroesophageal reflux disease)   . High blood pressure   . Hypertension   . Migraine   . Obesity   . Obstructive sleep apnea   . Vitamin D deficiency     PAST SURGICAL HISTORY: Past Surgical History:  Procedure Laterality Date  . CHOLECYSTECTOMY    . SLEEVE GASTROPLASTY    . TUBAL LIGATION      SOCIAL HISTORY: Social History  Substance Use Topics  . Smoking status: Never Smoker  . Smokeless tobacco: Never Used  . Alcohol use Yes     Comment: socially    FAMILY HISTORY: Family History  Problem Relation Age of Onset  . Diabetes Other   . Hyperlipidemia Other   . Hypertension Other     ROS: Review of Systems  Constitutional: Negative for weight loss.  Cardiovascular: Negative for chest pain and claudication.  Gastrointestinal: Negative for nausea and vomiting.  Musculoskeletal: Negative for myalgias.       Negative muscle weakness  Endo/Heme/Allergies:       Polyphagia    PHYSICAL EXAM: Blood pressure 110/71,  pulse 62, temperature 98.3 F (36.8 C), temperature source Oral, height 5\' 6"  (1.676 m), weight 253 lb (114.8 kg), SpO2 100 %. Body mass index is 40.84 kg/m. Physical Exam  Constitutional: She is oriented to person, place, and time. She appears well-developed and well-nourished.  Cardiovascular: Normal rate.   Pulmonary/Chest: Effort normal.  Musculoskeletal: Normal range of motion.  Neurological: She is oriented to person, place, and time.  Skin: Skin is warm and dry.  Psychiatric: She has a normal mood and affect. Her behavior is normal.  Vitals reviewed.   RECENT LABS AND TESTS: BMET    Component Value Date/Time  NA 137 06/18/2017 1049   K 4.7 06/18/2017 1049   CL 102 06/18/2017 1049   CO2 20 06/18/2017 1049   GLUCOSE 94 06/18/2017 1049   GLUCOSE 106 (H) 06/13/2016 1540   BUN 16 06/18/2017 1049   CREATININE 0.83 06/18/2017 1049   CALCIUM 9.1 06/18/2017 1049   GFRNONAA 85 06/18/2017 1049   GFRAA 98 06/18/2017 1049   Lab Results  Component Value Date   HGBA1C 5.3 06/18/2017   Lab Results  Component Value Date   INSULIN 10.0 06/18/2017   CBC    Component Value Date/Time   WBC 5.0 06/18/2017 1049   WBC 6.1 06/13/2016 1540   RBC 4.67 06/18/2017 1049   RBC 4.41 06/13/2016 1540   HGB 12.9 06/18/2017 1049   HCT 39.9 06/18/2017 1049   PLT 223 06/13/2016 1540   MCV 85 06/18/2017 1049   MCH 27.6 06/18/2017 1049   MCH 28.8 06/13/2016 1540   MCHC 32.3 06/18/2017 1049   MCHC 32.8 06/13/2016 1540   RDW 14.8 06/18/2017 1049   LYMPHSABS 2.0 06/18/2017 1049   MONOABS 0.6 06/13/2016 1540   EOSABS 0.1 06/18/2017 1049   BASOSABS 0.0 06/18/2017 1049   Iron/TIBC/Ferritin/ %Sat No results found for: IRON, TIBC, FERRITIN, IRONPCTSAT Lipid Panel     Component Value Date/Time   CHOL 207 (H) 06/18/2017 1049   TRIG 137 06/18/2017 1049   HDL 52 06/18/2017 1049   LDLCALC 128 (H) 06/18/2017 1049   Hepatic Function Panel     Component Value Date/Time   PROT 7.4 06/18/2017  1049   ALBUMIN 4.3 06/18/2017 1049   AST 26 06/18/2017 1049   ALT 12 06/18/2017 1049   ALKPHOS 70 06/18/2017 1049   BILITOT 0.3 06/18/2017 1049      Component Value Date/Time   TSH 2.060 06/18/2017 1049    ASSESSMENT AND PLAN: Other hyperlipidemia  Vitamin D deficiency - Plan: Vitamin D, Ergocalciferol, (DRISDOL) 50000 units CAPS capsule  Insulin resistance - Plan: metFORMIN (GLUCOPHAGE) 500 MG tablet  At risk for diabetes mellitus  Class 3 obesity with serious comorbidity and body mass index (BMI) of 40.0 to 44.9 in adult, unspecified obesity type (HCC)  PLAN:  Vitamin D Deficiency Kassiah was informed that low vitamin D levels contributes to fatigue and are associated with obesity, breast, and colon cancer. She agrees to continue to take OTC Vit D daily and start to take prescription Vit D @50 ,000 IU every week #4 with no refills and will follow up for routine testing of vitamin D, at least 2-3 times per year. She was informed of the risk of over-replacement of vitamin D and agrees to not increase her dose unless he discusses this with [place] first. Margret agrees to follow up with our clinic in 2 weeks.  Hyperlipidemia Marielle was informed of the American Heart Association Guidelines emphasizing intensive lifestyle modifications as the first line treatment for hyperlipidemia. We discussed many lifestyle modifications today in depth, and Aarin will continue to work on decreasing saturated fats such as fatty red meat, butter and many fried foods. She will also increase vegetables and lean protein in her diet and continue to work on exercise and weight loss efforts. We will re-check labs in 3 months and Argelia agrees to follow up with our clinic in 2 weeks.  Insulin Resistance Marjie will continue to work on weight loss, exercise, and decreasing simple carbohydrates in her diet to help decrease the risk of diabetes. We dicussed metformin including benefits and risks. She was  informed that eating  too many simple carbohydrates or too many calories at one sitting increases the likelihood of GI side effects. Teletha agrees to start metformin for now and prescription was written today for metformin 500 mg every morning #30 with no refills. Kaliopi agreed to follow up with [place] as directed to monitor her progress.  Obesity Starleen is currently in the action stage of change. As such, her goal is to continue with weight loss efforts She has agreed to portion control better and make smarter food choices, such as increase vegetables and decrease simple carbohydrates  and follow the Category 2 plan Perlie has been instructed to work up to a goal of 150 minutes of combined cardio and strengthening exercise per week for weight loss and overall health benefits. We discussed the following Behavioral Modification Strategies today: better snacking choices, dealing with family or coworker sabotage and emotional eating strategies  Davyn has agreed to follow up with our clinic in 2 weeks. She was informed of the importance of frequent follow up visits to maximize her success with intensive lifestyle modifications for her multiple health conditions.  I, Nevada Crane, am acting as transcriptionist for Quillian Quince, MD  I have reviewed the above documentation for accuracy and completeness, and I agree with the above. -Quillian Quince, MD   OBESITY BEHAVIORAL INTERVENTION VISIT  Today's visit was # 2 out of 22.  Starting weight: 253 lbs Starting date: 06/18/17 Today's weight : 253 lbs Today's date: 07/02/2017 Total lbs lost to date: 0 (Patients must lose 7 lbs in the first 6 months to continue with counseling)   ASK: We discussed the diagnosis of obesity with Onalee Hua today and Delva agreed to give [place] permission to discuss obesity behavioral modification therapy today.  ASSESS: Starla has the diagnosis of obesity and her BMI today is @40 .9 Barbee is in the action stage of change   ADVISE: Caelynn was  educated on the multiple health risks of obesity as well as the benefit of weight loss to improve her health. She was advised of the need for long term treatment and the importance of lifestyle modifications.  AGREE: Multiple dietary modification options and treatment options were discussed and  Bostyn agreed to follow the Category 2 plan We discussed the following Behavioral Modification Strategies today: better snacking choices, dealing with family or coworker sabotage and emotional eating strategies

## 2017-07-13 ENCOUNTER — Ambulatory Visit (INDEPENDENT_AMBULATORY_CARE_PROVIDER_SITE_OTHER): Payer: BLUE CROSS/BLUE SHIELD | Admitting: Family Medicine

## 2017-07-13 VITALS — BP 105/70 | HR 56 | Temp 98.1°F | Ht 66.0 in | Wt 246.0 lb

## 2017-07-13 DIAGNOSIS — R7303 Prediabetes: Secondary | ICD-10-CM

## 2017-07-13 DIAGNOSIS — E559 Vitamin D deficiency, unspecified: Secondary | ICD-10-CM | POA: Diagnosis not present

## 2017-07-13 DIAGNOSIS — E669 Obesity, unspecified: Secondary | ICD-10-CM

## 2017-07-13 DIAGNOSIS — Z6839 Body mass index (BMI) 39.0-39.9, adult: Secondary | ICD-10-CM

## 2017-07-13 DIAGNOSIS — IMO0001 Reserved for inherently not codable concepts without codable children: Secondary | ICD-10-CM | POA: Insufficient documentation

## 2017-07-13 MED ORDER — METFORMIN HCL 500 MG PO TABS: 500.0000 mg | ORAL_TABLET | Freq: Two times a day (BID) | ORAL | 1 refills | Status: DC

## 2017-07-13 MED ORDER — VITAMIN D (ERGOCALCIFEROL) 1.25 MG (50000 UNIT) PO CAPS: 50000.0000 [IU] | ORAL_CAPSULE | ORAL | 0 refills | Status: DC

## 2017-07-13 NOTE — Progress Notes (Signed)
Office: [phone]  /  Fax: [phone]   HPI:   Chief Complaint: OBESITY Kim Watson is here to discuss her progress with her obesity treatment plan. She is on the Category 2 plan and is following her eating plan approximately 85 % of the time. She states she is exercising 0 minutes 0 times per week. Kim Watson continues to do very well with weight loss on the category 2 plan but she is struggling with mid afternoon hunger. 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 Her weight is 246 lb (111.6 kg) today and has had a weight loss of 7 pounds over a period of 1 to 2 weeks since her last visit. She has lost 7 lbs since starting treatment with us.  Vitamin D deficiency Kim Watson has a diagnosis of vitamin D deficiency. She is currently stable on vit D, not yet at goal and she still notes fatigue but denies nausea, vomiting or muscle weakness.  Pre-Diabetes Kim Watson has a diagnosis of pre-diabetes based on her elevated Hgb A1c and was informed this puts her at greater risk of developing diabetes. She is taking metformin currently and continues to work on diet and exercise to decrease risk of diabetes. She notes afternoon polyphagia when her metformin wears off and struggled to not overeat at that time. She denies nausea or hypoglycemia.   ALLERGIES: Allergies  Allergen Reactions  . Percocet [Oxycodone-Acetaminophen]     Chills, nausea    MEDICATIONS: Current Outpatient Prescriptions on File Prior to Visit  Medication Sig Dispense Refill  . acetaminophen (TYLENOL) 325 MG tablet Take 650 mg by mouth every 6 (six) hours as needed for mild pain.     Marland Kitchen. ALPRAZolam (XANAX) 0.5 MG tablet Take 0.5 mg by mouth at bedtime as needed for anxiety.    Marland Kitchen. azithromycin (ZITHROMAX) 250 MG tablet Take 1 tablet (250 mg total) by mouth daily. Take first 2 tablets together, then 1 every day until finished. 6 tablet 0  . benzonatate (TESSALON) 100 MG capsule Take 1 capsule (100 mg total) by mouth every 8 (eight)  hours. 21 capsule 0  . Biotin 5000 MCG TABS Take 1 tablet by mouth daily.    . busPIRone (BUSPAR) 10 MG tablet Take 10 mg by mouth 3 (three) times daily.    . cholecalciferol (VITAMIN D) 1000 UNITS tablet Take 1,000 Units by mouth daily.    . citalopram (CELEXA) 20 MG tablet Take 20 mg by mouth daily.    . clonazePAM (KLONOPIN) 0.5 MG tablet Take 0.5 mg by mouth 2 (two) times daily as needed for anxiety.    . diphenhydrAMINE (BENADRYL) 25 mg capsule Take 1 capsule (25 mg total) by mouth every 6 (six) hours as needed (Take with Reglan and ibuprofen for migraine headache.). 30 capsule 0  . escitalopram (LEXAPRO) 10 MG tablet Take 10 mg by mouth daily.    Marland Kitchen. HYDROcodone-acetaminophen (NORCO) 5-325 MG per tablet Take 1 tablet by mouth every 6 (six) hours as needed for severe pain. 6 tablet 0  . ibuprofen (ADVIL,MOTRIN) 600 MG tablet Take 1 tablet (600 mg total) by mouth every 6 (six) hours as needed. 30 tablet 0  . ibuprofen (ADVIL,MOTRIN) 600 MG tablet Take 1 tablet (600 mg total) by mouth every 6 (six) hours as needed. 30 tablet 0  . metoCLOPramide (REGLAN) 10 MG tablet Take 1 tablet (10 mg total) by mouth every 6 (six) hours as needed for nausea (nausea/headache). 6 tablet 0  . metoCLOPramide (REGLAN) 10 MG  tablet Take 1 tablet (10 mg total) by mouth every 6 (six) hours as needed for nausea (As needed for migraine, take with Benadryl 25 mg and ibuprofen 600 mg.). 30 tablet 0  . Multiple Vitamin (MULTIVITAMIN WITH MINERALS) TABS tablet Take 1 tablet by mouth daily.    Marland Kitchen omeprazole (PRILOSEC) 20 MG capsule Take 1 capsule (20 mg total) by mouth daily. x2 weeks 14 capsule 0  . ondansetron (ZOFRAN) 4 MG tablet Take 1 tablet (4 mg total) by mouth every 8 (eight) hours as needed for nausea or vomiting. 20 tablet 0  . pantoprazole (PROTONIX) 40 MG tablet Take 40 mg by mouth daily.    . promethazine (PHENERGAN) 25 MG suppository Place 1 suppository (25 mg total) rectally every 6 (six) hours as needed for  nausea or vomiting. 15 suppository 1  . PROPRANOLOL HCL PO Take 1 tablet by mouth daily.    Marland Kitchen topiramate (TOPAMAX) 25 MG capsule Take 25 mg by mouth 2 (two) times daily.    . valACYclovir (VALTREX) 500 MG tablet Take 500 mg by mouth 2 (two) times daily.    Marland Kitchen albuterol (PROVENTIL HFA;VENTOLIN HFA) 108 (90 BASE) MCG/ACT inhaler Inhale 2 puffs into the lungs every 4 (four) hours as needed for wheezing or shortness of breath. 1 Inhaler 0   No current facility-administered medications on file prior to visit.     PAST MEDICAL HISTORY: Past Medical History:  Diagnosis Date  . Anxiety   . Arthritis   . Depression   . Gallbladder problem   . GERD (gastroesophageal reflux disease)   . High blood pressure   . Hypertension   . Migraine   . Obesity   . Obstructive sleep apnea   . Vitamin D deficiency     PAST SURGICAL HISTORY: Past Surgical History:  Procedure Laterality Date  . CHOLECYSTECTOMY    . SLEEVE GASTROPLASTY    . TUBAL LIGATION      SOCIAL HISTORY: Social History  Substance Use Topics  . Smoking status: Never Smoker  . Smokeless tobacco: Never Used  . Alcohol use Yes     Comment: socially    FAMILY HISTORY: Family History  Problem Relation Age of Onset  . Diabetes Other   . Hyperlipidemia Other   . Hypertension Other     ROS: Review of Systems  Constitutional: Positive for malaise/fatigue and weight loss.  Gastrointestinal: Negative for nausea and vomiting.  Musculoskeletal:       Negative muscle weakness  Endo/Heme/Allergies:       Polyphagia Negative hypoglycemia    PHYSICAL EXAM: Blood pressure 105/70, pulse (!) 56, temperature 98.1 F (36.7 C), temperature source Oral, height 5\' 6"  (1.676 m), weight 246 lb (111.6 kg), SpO2 99 %. Body mass index is 39.71 kg/m. Physical Exam  Constitutional: She is oriented to person, place, and time. She appears well-developed and well-nourished.  Cardiovascular: Normal rate.   Pulmonary/Chest: Effort normal.    Musculoskeletal: Normal range of motion.  Neurological: She is oriented to person, place, and time.  Skin: Skin is warm and dry.  Psychiatric: She has a normal mood and affect. Her behavior is normal.  Vitals reviewed.   RECENT LABS AND TESTS: BMET    Component Value Date/Time   NA 137 06/18/2017 1049   K 4.7 06/18/2017 1049   CL 102 06/18/2017 1049   CO2 20 06/18/2017 1049   GLUCOSE 94 06/18/2017 1049   GLUCOSE 106 (H) 06/13/2016 1540   BUN 16 06/18/2017 1049  CREATININE 0.83 06/18/2017 1049   CALCIUM 9.1 06/18/2017 1049   GFRNONAA 85 06/18/2017 1049   GFRAA 98 06/18/2017 1049   Lab Results  Component Value Date   HGBA1C 5.3 06/18/2017   Lab Results  Component Value Date   INSULIN 10.0 06/18/2017   CBC    Component Value Date/Time   WBC 5.0 06/18/2017 1049   WBC 6.1 06/13/2016 1540   RBC 4.67 06/18/2017 1049   RBC 4.41 06/13/2016 1540   HGB 12.9 06/18/2017 1049   HCT 39.9 06/18/2017 1049   PLT 223 06/13/2016 1540   MCV 85 06/18/2017 1049   MCH 27.6 06/18/2017 1049   MCH 28.8 06/13/2016 1540   MCHC 32.3 06/18/2017 1049   MCHC 32.8 06/13/2016 1540   RDW 14.8 06/18/2017 1049   LYMPHSABS 2.0 06/18/2017 1049   MONOABS 0.6 06/13/2016 1540   EOSABS 0.1 06/18/2017 1049   BASOSABS 0.0 06/18/2017 1049   Iron/TIBC/Ferritin/ %Sat No results found for: IRON, TIBC, FERRITIN, IRONPCTSAT Lipid Panel     Component Value Date/Time   CHOL 207 (H) 06/18/2017 1049   TRIG 137 06/18/2017 1049   HDL 52 06/18/2017 1049   LDLCALC 128 (H) 06/18/2017 1049   Hepatic Function Panel     Component Value Date/Time   PROT 7.4 06/18/2017 1049   ALBUMIN 4.3 06/18/2017 1049   AST 26 06/18/2017 1049   ALT 12 06/18/2017 1049   ALKPHOS 70 06/18/2017 1049   BILITOT 0.3 06/18/2017 1049      Component Value Date/Time   TSH 2.060 06/18/2017 1049    ASSESSMENT AND PLAN: Prediabetes - Plan: metFORMIN (GLUCOPHAGE) 500 MG tablet  Vitamin D deficiency - Plan: Vitamin D,  Ergocalciferol, (DRISDOL) 50000 units CAPS capsule  Class 2 obesity with serious comorbidity and body mass index (BMI) of 39.0 to 39.9 in adult, unspecified obesity type  PLAN:  Vitamin D Deficiency Kim Watson was informed that low vitamin D levels contributes to fatigue and are associated with obesity, breast, and colon cancer. She agrees to continue to take prescription Vit D @50 ,000 IU every week, we will refill for 1 month and will follow up for routine testing of vitamin D, at least 2-3 times per year. She was informed of the risk of over-replacement of vitamin D and agrees to not increase her dose unless he discusses this with [place] first. Kim Watson agrees to follow up with our clinic in 2 to 3 weeks.  Pre-Diabetes Kim Watson will continue to work on weight loss, exercise, and decreasing simple carbohydrates in her diet to help decrease the risk of diabetes. We dicussed metformin including benefits and risks. She was informed that eating too many simple carbohydrates or too many calories at one sitting increases the likelihood of GI side effects. Kim Watson agrees to increase metformin to 500 mg bid #60 with no refills and will follow up with [place] as directed to monitor her progress.  Obesity Kim Watson is currently in the action stage of change. As such, her goal is to continue with weight loss efforts She has agreed to follow the Category 2 plan Kim Watson has been instructed to work up to a goal of 150 minutes of combined cardio and strengthening exercise per week for weight loss and overall health benefits. We discussed the following Behavioral Modification Strategies today: increasing lean protein intake and decreasing simple carbohydrates   Kim Watson has agreed to follow up with our clinic in 2 to 3 weeks. She was informed of the importance of frequent follow up visits to  maximize her success with intensive lifestyle modifications for her multiple health conditions.  I, Nevada Crane, am acting as transcriptionist for  Quillian Quince, MD  I have reviewed the above documentation for accuracy and completeness, and I agree with the above. -Quillian Quince, MD   OBESITY BEHAVIORAL INTERVENTION VISIT  Today's visit was # 3 out of 22.  Starting weight: 253 lbs Starting date: 06/18/17 Today's weight : 246 lbs Today's date: 07/13/2017 Total lbs lost to date: 7 (Patients must lose 7 lbs in the first 6 months to continue with counseling)   ASK: We discussed the diagnosis of obesity with Kim Watson today and Kim Watson agreed to give [place] permission to discuss obesity behavioral modification therapy today.  ASSESS: Kim Watson has the diagnosis of obesity and her BMI today is 39.8 Kim Watson is in the action stage of change   ADVISE: Kim Watson was educated on the multiple health risks of obesity as well as the benefit of weight loss to improve her health. She was advised of the need for long term treatment and the importance of lifestyle modifications.  AGREE: Multiple dietary modification options and treatment options were discussed and  Kim Watson agreed to follow the Category 2 plan We discussed the following Behavioral Modification Strategies today: increasing lean protein intake and decreasing simple carbohydrates

## 2017-07-27 ENCOUNTER — Ambulatory Visit (INDEPENDENT_AMBULATORY_CARE_PROVIDER_SITE_OTHER): Payer: BLUE CROSS/BLUE SHIELD | Admitting: Family Medicine

## 2017-07-27 VITALS — BP 111/73 | HR 59 | Temp 98.0°F | Ht 66.0 in | Wt 247.0 lb

## 2017-07-27 DIAGNOSIS — IMO0001 Reserved for inherently not codable concepts without codable children: Secondary | ICD-10-CM

## 2017-07-27 DIAGNOSIS — Z6841 Body Mass Index (BMI) 40.0 and over, adult: Secondary | ICD-10-CM | POA: Diagnosis not present

## 2017-07-27 DIAGNOSIS — E669 Obesity, unspecified: Secondary | ICD-10-CM

## 2017-07-27 DIAGNOSIS — F3289 Other specified depressive episodes: Secondary | ICD-10-CM

## 2017-07-27 DIAGNOSIS — E8881 Metabolic syndrome: Secondary | ICD-10-CM | POA: Diagnosis not present

## 2017-07-27 DIAGNOSIS — F32A Depression, unspecified: Secondary | ICD-10-CM | POA: Insufficient documentation

## 2017-07-27 DIAGNOSIS — F329 Major depressive disorder, single episode, unspecified: Secondary | ICD-10-CM | POA: Insufficient documentation

## 2017-07-27 MED ORDER — ESCITALOPRAM OXALATE 20 MG PO TABS: 20.0000 mg | ORAL_TABLET | Freq: Every day | ORAL | 0 refills | Status: DC

## 2017-07-27 NOTE — Progress Notes (Signed)
Office: [phone]  /  Fax: [phone]   HPI:   Chief Complaint: OBESITY Kim Watson is here to discuss her progress with her obesity treatment plan. She is on the  follow the Category 2 plan and is following her eating plan approximately 75 to 80 % of the time. She states she is exercising 0 minutes 0 times per week. Kim Watson is struggling more in the last 2 weeks with increased emotional eating and not planning meals ahead of time. She is also getting bored with some of her meal options. Her weight is 247 lb (112 kg) today and has had a weight gain of 1 pound over a period of 2 weeks since her last visit. She has lost 6 lbs since starting treatment with [place].  Depression with Anxiety Kim Watson notes worsening mood with anxiety. She had to call 911 for a panic attack last week. She struggles with emotional eating and using food for comfort to the extent that it is negatively impacting her health. She often snacks when she is not hungry. Kim Watson sometimes feels she is out of control and then feels guilty that she made poor food choices. She has been working on behavior modification techniques to help reduce her emotional eating and has been somewhat successful. She shows no sign of suicidal or homicidal ideations.  Insulin Resistance Kim Watson has a diagnosis of insulin resistance based on her elevated fasting insulin level >5. Although Kim Watson's blood glucose readings are still under good control, insulin resistance puts her at greater risk of metabolic syndrome and diabetes. She is on metformin 2 times daily and she denies GI upset. Kim Watson continues to work on diet and exercise to decrease risk of diabetes.  Depression screen PHQ 2/9 06/18/2017  Decreased Interest 3  Down, Depressed, Hopeless 3  PHQ - 2 Score 6  Altered sleeping 2  Tired, decreased energy 3  Change in appetite 3  Feeling bad or failure about yourself  2  Trouble concentrating 1  Moving slowly or fidgety/restless 0  Suicidal thoughts 0    PHQ-9 Score 17      ALLERGIES: Allergies  Allergen Reactions   Percocet [Oxycodone-Acetaminophen]     Chills, nausea    MEDICATIONS: Current Outpatient Prescriptions on File Prior to Visit  Medication Sig Dispense Refill   acetaminophen (TYLENOL) 325 MG tablet Take 650 mg by mouth every 6 (six) hours as needed for mild pain.      albuterol (PROVENTIL HFA;VENTOLIN HFA) 108 (90 BASE) MCG/ACT inhaler Inhale 2 puffs into the lungs every 4 (four) hours as needed for wheezing or shortness of breath. 1 Inhaler 0   azithromycin (ZITHROMAX) 250 MG tablet Take 1 tablet (250 mg total) by mouth daily. Take first 2 tablets together, then 1 every day until finished. 6 tablet 0   benzonatate (TESSALON) 100 MG capsule Take 1 capsule (100 mg total) by mouth every 8 (eight) hours. 21 capsule 0   Biotin 5000 MCG TABS Take 1 tablet by mouth daily.     busPIRone (BUSPAR) 10 MG tablet Take 10 mg by mouth 3 (three) times daily.     cholecalciferol (VITAMIN D) 1000 UNITS tablet Take 1,000 Units by mouth daily.     clonazePAM (KLONOPIN) 0.5 MG tablet Take 0.5 mg by mouth 2 (two) times daily as needed for anxiety.     diphenhydrAMINE (BENADRYL) 25 mg capsule Take 1 capsule (25 mg total) by mouth every 6 (six) hours as needed (Take with Reglan and ibuprofen for migraine headache.). 30  capsule 0   HYDROcodone-acetaminophen (NORCO) 5-325 MG per tablet Take 1 tablet by mouth every 6 (six) hours as needed for severe pain. 6 tablet 0   ibuprofen (ADVIL,MOTRIN) 600 MG tablet Take 1 tablet (600 mg total) by mouth every 6 (six) hours as needed. 30 tablet 0   metFORMIN (GLUCOPHAGE) 500 MG tablet Take 1 tablet (500 mg total) by mouth 2 (two) times daily with a meal. 60 tablet 1   metoCLOPramide (REGLAN) 10 MG tablet Take 1 tablet (10 mg total) by mouth every 6 (six) hours as needed for nausea (nausea/headache). 6 tablet 0   metoCLOPramide (REGLAN) 10 MG tablet Take 1 tablet (10 mg total) by mouth every 6  (six) hours as needed for nausea (As needed for migraine, take with Benadryl 25 mg and ibuprofen 600 mg.). 30 tablet 0   Multiple Vitamin (MULTIVITAMIN WITH MINERALS) TABS tablet Take 1 tablet by mouth daily.     omeprazole (PRILOSEC) 20 MG capsule Take 1 capsule (20 mg total) by mouth daily. x2 weeks 14 capsule 0   ondansetron (ZOFRAN) 4 MG tablet Take 1 tablet (4 mg total) by mouth every 8 (eight) hours as needed for nausea or vomiting. 20 tablet 0   pantoprazole (PROTONIX) 40 MG tablet Take 40 mg by mouth daily.     promethazine (PHENERGAN) 25 MG suppository Place 1 suppository (25 mg total) rectally every 6 (six) hours as needed for nausea or vomiting. 15 suppository 1   PROPRANOLOL HCL PO Take 1 tablet by mouth daily.     topiramate (TOPAMAX) 25 MG capsule Take 25 mg by mouth 2 (two) times daily.     valACYclovir (VALTREX) 500 MG tablet Take 500 mg by mouth 2 (two) times daily.     Vitamin D, Ergocalciferol, (DRISDOL) 50000 units CAPS capsule Take 1 capsule (50,000 Units total) by mouth every 7 (seven) days. 4 capsule 0   No current facility-administered medications on file prior to visit.     PAST MEDICAL HISTORY: Past Medical History:  Diagnosis Date   Anxiety    Arthritis    Depression    Gallbladder problem    GERD (gastroesophageal reflux disease)    High blood pressure    Hypertension    Migraine    Obesity    Obstructive sleep apnea    Vitamin D deficiency     PAST SURGICAL HISTORY: Past Surgical History:  Procedure Laterality Date   CHOLECYSTECTOMY     SLEEVE GASTROPLASTY     TUBAL LIGATION      SOCIAL HISTORY: Social History  Substance Use Topics   Smoking status: Never Smoker   Smokeless tobacco: Never Used   Alcohol use Yes     Comment: socially    FAMILY HISTORY: Family History  Problem Relation Age of Onset   Diabetes Other    Hyperlipidemia Other    Hypertension Other     ROS: Review of Systems  Constitutional:  Negative for weight loss.  Gastrointestinal: Negative for diarrhea, nausea and vomiting.  Psychiatric/Behavioral: Positive for depression. Negative for suicidal ideas.    PHYSICAL EXAM: Blood pressure 111/73, pulse (!) 59, temperature 98 F (36.7 C), temperature source Oral, height 5\' 6"  (1.676 m), weight 247 lb (112 kg), SpO2 98 %. Body mass index is 39.87 kg/m. Physical Exam  Constitutional: She is oriented to person, place, and time. She appears well-developed and well-nourished.  Cardiovascular: Normal rate.   Pulmonary/Chest: Effort normal.  Musculoskeletal: Normal range of motion.  Neurological: She is  oriented to person, place, and time.  Skin: Skin is warm and dry.  Psychiatric: She has a normal mood and affect. Her behavior is normal.  Vitals reviewed.   RECENT LABS AND TESTS: BMET    Component Value Date/Time   NA 137 06/18/2017 1049   K 4.7 06/18/2017 1049   CL 102 06/18/2017 1049   CO2 20 06/18/2017 1049   GLUCOSE 94 06/18/2017 1049   GLUCOSE 106 (H) 06/13/2016 1540   BUN 16 06/18/2017 1049   CREATININE 0.83 06/18/2017 1049   CALCIUM 9.1 06/18/2017 1049   GFRNONAA 85 06/18/2017 1049   GFRAA 98 06/18/2017 1049   Lab Results  Component Value Date   HGBA1C 5.3 06/18/2017   Lab Results  Component Value Date   INSULIN 10.0 06/18/2017   CBC    Component Value Date/Time   WBC 5.0 06/18/2017 1049   WBC 6.1 06/13/2016 1540   RBC 4.67 06/18/2017 1049   RBC 4.41 06/13/2016 1540   HGB 12.9 06/18/2017 1049   HCT 39.9 06/18/2017 1049   PLT 223 06/13/2016 1540   MCV 85 06/18/2017 1049   MCH 27.6 06/18/2017 1049   MCH 28.8 06/13/2016 1540   MCHC 32.3 06/18/2017 1049   MCHC 32.8 06/13/2016 1540   RDW 14.8 06/18/2017 1049   LYMPHSABS 2.0 06/18/2017 1049   MONOABS 0.6 06/13/2016 1540   EOSABS 0.1 06/18/2017 1049   BASOSABS 0.0 06/18/2017 1049   Iron/TIBC/Ferritin/ %Sat No results found for: IRON, TIBC, FERRITIN, IRONPCTSAT Lipid Panel     Component Value  Date/Time   CHOL 207 (H) 06/18/2017 1049   TRIG 137 06/18/2017 1049   HDL 52 06/18/2017 1049   LDLCALC 128 (H) 06/18/2017 1049   Hepatic Function Panel     Component Value Date/Time   PROT 7.4 06/18/2017 1049   ALBUMIN 4.3 06/18/2017 1049   AST 26 06/18/2017 1049   ALT 12 06/18/2017 1049   ALKPHOS 70 06/18/2017 1049   BILITOT 0.3 06/18/2017 1049      Component Value Date/Time   TSH 2.060 06/18/2017 1049    ASSESSMENT AND PLAN: Insulin resistance  Other depression - with anxiety - Plan: escitalopram (LEXAPRO) 20 MG tablet  Class 3 obesity with serious comorbidity and body mass index (BMI) of 40.0 to 44.9 in adult, unspecified obesity type (HCC)  PLAN:  Depression with Anxiety We discussed behavior modification techniques today to help Kim Watson deal with her emotional eating and depression. She has agreed to increase Lexapro to 20 mg qd #30 with no refills and continue Buspar. Kim Watson agreed to follow up as directed.  Insulin Resistance Kim Watson will continue to work on weight loss, exercise, and decreasing simple carbohydrates in her diet to help decrease the risk of diabetes. We dicussed metformin including benefits and risks. She was informed that eating too many simple carbohydrates or too many calories at one sitting increases the likelihood of GI side effects. Kim Watson agrees to continue metformin for now and prescription was not written today. Kim Watson agreed to follow up with [place] as directed to monitor her progress.  Obesity Kim Watson is currently in the action stage of change. As such, her goal is to continue with weight loss efforts She has agreed to follow the Category 2 plan + breakfast options Kim Watson has been instructed to work up to a goal of 150 minutes of combined cardio and strengthening exercise per week for weight loss and overall health benefits. We discussed the following Behavioral Modification Strategies today: planning for success,  increasing lean protein intake, emotional  eating strategies and avoiding temptations  Kim Watson has agreed to follow up with our clinic in 2 weeks. She was informed of the importance of frequent follow up visits to maximize her success with intensive lifestyle modifications for her multiple health conditions.  I, [place] Crane, am acting as transcriptionist for Quillian Quince, MD  I have reviewed the above documentation for accuracy and completeness, and I agree with the above. -Quillian Quince, MD  OBESITY BEHAVIORAL INTERVENTION VISIT  Today's visit was # 4 out of 22.  Starting weight: 253 lbs Starting date: 06/18/17 Today's weight : 247 lbs Today's date: 08/04/2017 Total lbs lost to date: 6 (Patients must lose 7 lbs in the first 6 months to continue with counseling)   ASK: We discussed the diagnosis of obesity with Kim Watson today and Kim Watson agreed to give [place] permission to discuss obesity behavioral modification therapy today.  ASSESS: Shristi has the diagnosis of obesity and her BMI today is 39.89 Ayaana is in the action stage of change   ADVISE: Tiffanyann was educated on the multiple health risks of obesity as well as the benefit of weight loss to improve her health. She was advised of the need for long term treatment and the importance of lifestyle modifications.  AGREE: Multiple dietary modification options and treatment options were discussed and  Aylani agreed to follow the Category 2 plan  + breakfast options We discussed the following Behavioral Modification Strategies today: planning for success, increasing lean protein intake, emotional eating strategies and avoiding temptations

## 2017-08-10 ENCOUNTER — Ambulatory Visit (INDEPENDENT_AMBULATORY_CARE_PROVIDER_SITE_OTHER): Payer: BLUE CROSS/BLUE SHIELD | Admitting: Family Medicine

## 2017-08-10 VITALS — BP 110/75 | HR 59 | Temp 97.9°F | Ht 66.0 in | Wt 245.0 lb

## 2017-08-10 DIAGNOSIS — Z6839 Body mass index (BMI) 39.0-39.9, adult: Secondary | ICD-10-CM | POA: Diagnosis not present

## 2017-08-10 DIAGNOSIS — F3289 Other specified depressive episodes: Secondary | ICD-10-CM | POA: Diagnosis not present

## 2017-08-10 DIAGNOSIS — E559 Vitamin D deficiency, unspecified: Secondary | ICD-10-CM

## 2017-08-10 DIAGNOSIS — K5909 Other constipation: Secondary | ICD-10-CM | POA: Diagnosis not present

## 2017-08-10 DIAGNOSIS — IMO0001 Reserved for inherently not codable concepts without codable children: Secondary | ICD-10-CM

## 2017-08-10 DIAGNOSIS — E669 Obesity, unspecified: Secondary | ICD-10-CM | POA: Diagnosis not present

## 2017-08-10 MED ORDER — BENEFIBER DRINK MIX PO PACK: 1.0000 | PACK | Freq: Every day | ORAL | Status: DC

## 2017-08-10 MED ORDER — VITAMIN D (ERGOCALCIFEROL) 1.25 MG (50000 UNIT) PO CAPS: 50000.0000 [IU] | ORAL_CAPSULE | ORAL | 0 refills | Status: DC

## 2017-08-10 NOTE — Progress Notes (Signed)
**Note Kim-Identified via Obfuscation** Office: [phone]  /  Fax: [phone]   HPI:   Chief Complaint: OBESITY Kim Watson is here to discuss her progress with her obesity treatment plan. She is on the follow the Category 2 plan and is following her eating plan approximately 80 % of the time. She states she is exercising 0 minutes 0 times per week. Kim Watson  Continues to do well with weight loss but she is bored with her dinner options. Kim Watson is active at work, as she walks more than 10,000 steps per day. Her weight is 245 lb (111.1 kg) today and she has had a weight loss of 2 pounds over a period of 2 weeks since her last visit. She has lost 8 lbs since starting treatment with us.  Vitamin D deficiency Kim Watson has a diagnosis of vitamin D deficiency. She is currently stable on vit D and denies nausea, vomiting or muscle weakness.  Depression with emotional eating behaviors Kim Watson increased Lexapro to 20 mg and feels more in control of her emotional eating. She has some insomnia when she takes her Lexapro in the pm. Kim Watson struggles with emotional eating and using food for comfort to the extent that it is negatively impacting her health. She often snacks when she is not hungry. Kim Watson sometimes feels she is out of control and then feels guilty that she made poor food choices. She has been working on behavior modification techniques to help reduce her emotional eating and has been somewhat successful. She shows no sign of suicidal or homicidal ideations.  Constipation Kim Watson notes constipation for the last few weeks, worse since attempting weight loss. She states BM are less frequent and are somewhat harder. She denies hematochezia or melena. She admits to not eating much vegetables lately.  Depression screen PHQ 2/9 06/18/2017  Decreased Interest 3  Down, Depressed, Hopeless 3  PHQ - 2 Score 6  Altered sleeping 2  Tired, decreased energy 3  Change in appetite 3  Feeling bad or failure about yourself  2  Trouble concentrating 1    Moving slowly or fidgety/restless 0  Suicidal thoughts 0  PHQ-9 Score 17      ALLERGIES: Allergies  Allergen Reactions   Percocet [Oxycodone-Acetaminophen]     Chills, nausea    MEDICATIONS: Current Outpatient Prescriptions on File Prior to Visit  Medication Sig Dispense Refill   acetaminophen (TYLENOL) 325 MG tablet Take 650 mg by mouth every 6 (six) hours as needed for mild pain.      busPIRone (BUSPAR) 10 MG tablet Take 10 mg by mouth 3 (three) times daily.     cholecalciferol (VITAMIN D) 1000 UNITS tablet Take 1,000 Units by mouth daily.     clonazePAM (KLONOPIN) 0.5 MG tablet Take 0.5 mg by mouth 2 (two) times daily as needed for anxiety.     escitalopram (LEXAPRO) 20 MG tablet Take 1 tablet (20 mg total) by mouth daily. 30 tablet 0   metFORMIN (GLUCOPHAGE) 500 MG tablet Take 1 tablet (500 mg total) by mouth 2 (two) times daily with a meal. 60 tablet 1   metoCLOPramide (REGLAN) 10 MG tablet Take 1 tablet (10 mg total) by mouth every 6 (six) hours as needed for nausea (nausea/headache). 6 tablet 0   Multiple Vitamin (MULTIVITAMIN WITH MINERALS) TABS tablet Take 1 tablet by mouth daily.     pantoprazole (PROTONIX) 40 MG tablet Take 40 mg by mouth daily.     topiramate (TOPAMAX) 25 MG capsule Take 25 mg by mouth 2 (two) times daily.  valACYclovir (VALTREX) 500 MG tablet Take 500 mg by mouth 2 (two) times daily.     No current facility-administered medications on file prior to visit.     PAST MEDICAL HISTORY: Past Medical History:  Diagnosis Date   Anxiety    Arthritis    Depression    Gallbladder problem    GERD (gastroesophageal reflux disease)    High blood pressure    Hypertension    Migraine    Obesity    Obstructive sleep apnea    Vitamin D deficiency     PAST SURGICAL HISTORY: Past Surgical History:  Procedure Laterality Date   CHOLECYSTECTOMY     SLEEVE GASTROPLASTY     TUBAL LIGATION      SOCIAL HISTORY: Social History   Substance Use Topics   Smoking status: Never Smoker   Smokeless tobacco: Never Used   Alcohol use Yes     Comment: socially    FAMILY HISTORY: Family History  Problem Relation Age of Onset   Diabetes Other    Hyperlipidemia Other    Hypertension Other     ROS: Review of Systems  Constitutional: Positive for weight loss.  Gastrointestinal: Negative for nausea and vomiting.  Musculoskeletal:       Negative muscle weakness  Psychiatric/Behavioral: Positive for depression. Negative for suicidal ideas. The patient has insomnia.     PHYSICAL EXAM: Blood pressure 110/75, pulse (!) 59, temperature 97.9 F (36.6 C), temperature source Oral, height  (1.676 m), weight 245 lb (111.1 kg), last menstrual period 08/06/2017, SpO2 100 %. Body mass index is 39.54 kg/m. Physical Exam  Constitutional: She is oriented to person, place, and time. She appears well-developed and well-nourished.  Cardiovascular: Normal rate.   Pulmonary/Chest: Effort normal.  Musculoskeletal: Normal range of motion.  Neurological: She is oriented to person, place, and time.  Skin: Skin is warm and dry.  Psychiatric: She has a normal mood and affect. Her behavior is normal.  Vitals reviewed.   RECENT LABS AND TESTS: BMET    Component Value Date/Time   NA 137 06/18/2017 1049   K 4.7 06/18/2017 1049   CL 102 06/18/2017 1049   CO2 20 06/18/2017 1049   GLUCOSE 94 06/18/2017 1049   GLUCOSE 106 (H) 06/13/2016 1540   BUN 16 06/18/2017 1049   CREATININE 0.83 06/18/2017 1049   CALCIUM 9.1 06/18/2017 1049   GFRNONAA 85 06/18/2017 1049   GFRAA 98 06/18/2017 1049   Lab Results  Component Value Date   HGBA1C 5.3 06/18/2017   Lab Results  Component Value Date   INSULIN 10.0 06/18/2017   CBC    Component Value Date/Time   WBC 5.0 06/18/2017 1049   WBC 6.1 06/13/2016 1540   RBC 4.67 06/18/2017 1049   RBC 4.41 06/13/2016 1540   HGB 12.9 06/18/2017 1049   HCT 39.9 06/18/2017 1049   PLT 223  06/13/2016 1540   MCV 85 06/18/2017 1049   MCH 27.6 06/18/2017 1049   MCH 28.8 06/13/2016 1540   MCHC 32.3 06/18/2017 1049   MCHC 32.8 06/13/2016 1540   RDW 14.8 06/18/2017 1049   LYMPHSABS 2.0 06/18/2017 1049   MONOABS 0.6 06/13/2016 1540   EOSABS 0.1 06/18/2017 1049   BASOSABS 0.0 06/18/2017 1049   Iron/TIBC/Ferritin/ %Sat No results found for: IRON, TIBC, FERRITIN, IRONPCTSAT Lipid Panel     Component Value Date/Time   CHOL 207 (H) 06/18/2017 1049   TRIG 137 06/18/2017 1049   HDL 52 06/18/2017 1049   LDLCALC  128 (H) 06/18/2017 1049   Hepatic Function Panel     Component Value Date/Time   PROT 7.4 06/18/2017 1049   ALBUMIN 4.3 06/18/2017 1049   AST 26 06/18/2017 1049   ALT 12 06/18/2017 1049   ALKPHOS 70 06/18/2017 1049   BILITOT 0.3 06/18/2017 1049      Component Value Date/Time   TSH 2.060 06/18/2017 1049    ASSESSMENT AND PLAN: Vitamin D deficiency - Plan: Vitamin D, Ergocalciferol, (DRISDOL) 50000 units CAPS capsule  Other depression  Other constipation - Plan: Wheat Dextrin (BENEFIBER DRINK MIX) PACK  Class 2 obesity with serious comorbidity and body mass index (BMI) of 39.0 to 39.9 in adult, unspecified obesity type  PLAN:  Vitamin D Deficiency Kim Watson was informed that low vitamin D levels contributes to fatigue and are associated with obesity, breast, and colon cancer. She agrees to continue to take prescription Vit D ,000 IU every week, we will refill for 1 month and will follow up for routine testing of vitamin D, at least 2-3 times per year. She was informed of the risk of over-replacement of vitamin D and agrees to not increase her dose unless he discusses this with [place] first. Kim Watson agrees to follow up with our clinic in 2 to 3 weeks.  Depression with Emotional Eating Behaviors We discussed behavior modification techniques today to help Kim Watson deal with her emotional eating and depression. She has agreed to change Lexapro to take in the morning to  decrease insomnia and she will follow up as directed.  Constipation Kim Watson was informed decrease bowel movement frequency is normal while losing weight, but stools should not be hard or painful. She was advised to increase her H20 intake and work on increasing her fiber intake. High fiber foods were discussed today. Kim Watson agrees to increase vegetables and start OTC Benefiber 1 to 2 times per day and will follow up with our clinic 2 to 3 weeks.  Obesity Kim Watson is currently in the action stage of change. As such, her goal is to continue with weight loss efforts She has agreed to keep a food journal with 400 to 550 calories and 35 grams of protein at supper daily and follow the Category 2 plan Kim Watson has been instructed to work up to a goal of 150 minutes of combined cardio and strengthening exercise per week for weight loss and overall health benefits. We discussed the following Behavioral Modification Strategies today: increase H2O intake, keep a strict food journal, increasing lean protein intake, decreasing simple carbohydrates, increasing vegetables, increasing fiber rich foods and work on meal planning and easy cooking plans  Kim Watson has agreed to follow up with our clinic in 2 to 3 weeks. She was informed of the importance of frequent follow up visits to maximize her success with intensive lifestyle modifications for her multiple health conditions.  Kim Watson, Kim Watson, am acting as transcriptionist for Kim Quince, MD  Kim Watson have reviewed the above documentation for accuracy and completeness, and Kim Watson agree with the above. -Kim Quince, MD   OBESITY BEHAVIORAL INTERVENTION VISIT  Today's visit was # 5 out of 22.  Starting weight: 253 lbs Starting date: 06/18/17 Today's weight : 245 lbs Today's date: 08/10/2017 Total lbs lost to date: 8 (Patients must lose 7 lbs in the first 6 months to continue with counseling)   ASK: We discussed the diagnosis of obesity with Kim Watson today and Kim Watson  agreed to give [place] permission to discuss obesity behavioral modification therapy today.  ASSESS:  Kim Watson has the diagnosis of obesity and her BMI today is 39.56 Kim Watson is in the action stage of change   ADVISE: Kim Watson was educated on the multiple health risks of obesity as well as the benefit of weight loss to improve her health. She was advised of the need for long term treatment and the importance of lifestyle modifications.  AGREE: Multiple dietary modification options and treatment options were discussed and  Kim Watson agreed to keep a food journal with 400 to 500 calories and 35 grams of protein at supper daily and follow the Category 2 plan We discussed the following Behavioral Modification Strategies today: increase H2O intake, keep a strict food journal, increasing lean protein intake, decreasing simple carbohydrates, increasing vegetables, increasing fiber rich foods and work on meal planning and easy cooking plans

## 2017-08-12 ENCOUNTER — Encounter (INDEPENDENT_AMBULATORY_CARE_PROVIDER_SITE_OTHER): Payer: Self-pay | Admitting: Family Medicine

## 2017-08-13 NOTE — Addendum Note (Signed)
Addended by: Len BlalockMOORE, Gionni Freese L on: 08/13/2017 04:19 PM   Modules accepted: Orders

## 2017-08-24 ENCOUNTER — Ambulatory Visit (INDEPENDENT_AMBULATORY_CARE_PROVIDER_SITE_OTHER): Payer: BLUE CROSS/BLUE SHIELD | Admitting: Family Medicine

## 2017-08-24 ENCOUNTER — Encounter (INDEPENDENT_AMBULATORY_CARE_PROVIDER_SITE_OTHER): Payer: Self-pay

## 2017-08-31 ENCOUNTER — Encounter (INDEPENDENT_AMBULATORY_CARE_PROVIDER_SITE_OTHER): Payer: Self-pay | Admitting: Family Medicine

## 2017-09-14 ENCOUNTER — Ambulatory Visit: Payer: Self-pay | Admitting: Psychology

## 2017-09-21 ENCOUNTER — Other Ambulatory Visit (INDEPENDENT_AMBULATORY_CARE_PROVIDER_SITE_OTHER): Payer: Self-pay | Admitting: Family Medicine

## 2017-09-21 ENCOUNTER — Ambulatory Visit (HOSPITAL_COMMUNITY): Payer: Self-pay | Admitting: Licensed Clinical Social Worker

## 2017-09-21 DIAGNOSIS — R7303 Prediabetes: Secondary | ICD-10-CM

## 2017-09-21 DIAGNOSIS — E559 Vitamin D deficiency, unspecified: Secondary | ICD-10-CM

## 2017-09-24 ENCOUNTER — Other Ambulatory Visit (INDEPENDENT_AMBULATORY_CARE_PROVIDER_SITE_OTHER): Payer: Self-pay | Admitting: Family Medicine

## 2017-09-24 DIAGNOSIS — R7303 Prediabetes: Secondary | ICD-10-CM

## 2017-10-08 ENCOUNTER — Ambulatory Visit (HOSPITAL_COMMUNITY): Payer: Self-pay | Admitting: Licensed Clinical Social Worker

## 2017-12-07 ENCOUNTER — Ambulatory Visit (INDEPENDENT_AMBULATORY_CARE_PROVIDER_SITE_OTHER): Payer: BLUE CROSS/BLUE SHIELD | Admitting: Family Medicine

## 2017-12-07 VITALS — BP 133/85 | HR 89 | Temp 98.1°F | Ht 66.0 in | Wt 254.0 lb

## 2017-12-07 DIAGNOSIS — E559 Vitamin D deficiency, unspecified: Secondary | ICD-10-CM

## 2017-12-07 DIAGNOSIS — E8881 Metabolic syndrome: Secondary | ICD-10-CM

## 2017-12-07 DIAGNOSIS — Z6841 Body Mass Index (BMI) 40.0 and over, adult: Secondary | ICD-10-CM | POA: Diagnosis not present

## 2017-12-07 MED ORDER — METFORMIN HCL 500 MG PO TABS: 500.0000 mg | ORAL_TABLET | Freq: Two times a day (BID) | ORAL | 1 refills | Status: DC

## 2017-12-07 MED ORDER — VITAMIN D (ERGOCALCIFEROL) 1.25 MG (50000 UNIT) PO CAPS: 50000.0000 [IU] | ORAL_CAPSULE | ORAL | 0 refills | Status: DC

## 2017-12-07 NOTE — Progress Notes (Signed)
Office: [phone]  /  Fax: [phone]   HPI:   Chief Complaint: OBESITY Noheli is here to discuss her progress with her obesity treatment plan. She is on the keep a food journal with 400 to 550 calories and 35 grams of protein at supper daily and Category 2 plan and is following her eating plan approximately 0 % of the time. She states she is exercising 0 minutes 0 times per week. Clytie has not been seen in our clinic for 3 to 4 months due to other health issues, but she is now ready to get back on track. Sharis wants to discuss other eating options. 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 Her weight is 254 lb (115.2 kg) today and has had a weight gain of 9 pounds over a period of 12 weeks since her last visit. She has gained 1 lb since starting treatment with [place].  Insulin Resistance Citlali has a diagnosis of insulin resistance based on her elevated fasting insulin level >5. Although Adara's blood glucose readings are still under good control, insulin resistance puts her at greater risk of metabolic syndrome and diabetes. She had been on metformin in the past and noted decreased polyphagia. She has been out of metformin for 2 to 3 months. Will is ready to get back to a diet prescription.  Vitamin D deficiency Tynasia has a diagnosis of vitamin D deficiency. She is stable on vit D, but is not yet at goal. Jhoselin has been out of vit D for 2 to 3 months. She admits fatigue and denies nausea, vomiting or muscle weakness.   Ref. Range 06/18/2017 10:49  Vitamin D, 25-Hydroxy Latest Ref Range: 30.0 - 100.0 ng/mL 30.4    ALLERGIES: Allergies  Allergen Reactions  . Percocet [Oxycodone-Acetaminophen]     Chills, nausea    MEDICATIONS: Current Outpatient Medications on File Prior to Visit  Medication Sig Dispense Refill  . acetaminophen (TYLENOL) 325 MG tablet Take 650 mg by mouth every 6 (six) hours as needed for mild pain.     . busPIRone (BUSPAR) 10 MG tablet Take 10 mg by mouth 3 (three)  times daily.    . cholecalciferol (VITAMIN D) 1000 UNITS tablet Take 1,000 Units by mouth daily.    . clonazePAM (KLONOPIN) 0.5 MG tablet Take 0.5 mg by mouth 2 (two) times daily as needed for anxiety.    Marland Kitchen FLUoxetine (PROZAC) 40 MG capsule Take 40 mg by mouth daily.    . metFORMIN (GLUCOPHAGE) 500 MG tablet Take 1 tablet (500 mg total) by mouth 2 (two) times daily with a meal. 60 tablet 1  . metoCLOPramide (REGLAN) 10 MG tablet Take 1 tablet (10 mg total) by mouth every 6 (six) hours as needed for nausea (nausea/headache). 6 tablet 0  . Multiple Vitamin (MULTIVITAMIN WITH MINERALS) TABS tablet Take 1 tablet by mouth daily.    . pantoprazole (PROTONIX) 40 MG tablet Take 40 mg by mouth daily.    Marland Kitchen topiramate (TOPAMAX) 25 MG capsule Take 25 mg by mouth 2 (two) times daily.    . valACYclovir (VALTREX) 500 MG tablet Take 500 mg by mouth 2 (two) times daily.    . Vitamin D, Ergocalciferol, (DRISDOL) 50000 units CAPS capsule Take 1 capsule (50,000 Units total) by mouth every 7 (seven) days. 4 capsule 0  . Wheat Dextrin (BENEFIBER DRINK MIX) PACK Take 1 Package by mouth daily.     No current facility-administered medications on file prior to visit.     PAST  MEDICAL HISTORY: Past Medical History:  Diagnosis Date  . Anxiety   . Arthritis   . Depression   . Gallbladder problem   . GERD (gastroesophageal reflux disease)   . High blood pressure   . Hypertension   . Migraine   . Obesity   . Obstructive sleep apnea   . Vitamin D deficiency     PAST SURGICAL HISTORY: Past Surgical History:  Procedure Laterality Date  . CHOLECYSTECTOMY    . SLEEVE GASTROPLASTY    . TUBAL LIGATION      SOCIAL HISTORY: Social History   Tobacco Use  . Smoking status: Never Smoker  . Smokeless tobacco: Never Used  Substance Use Topics  . Alcohol use: Yes    Comment: socially  . Drug use: No    FAMILY HISTORY: Family History  Problem Relation Age of Onset  . Diabetes Other   . Hyperlipidemia Other    . Hypertension Other     ROS: Review of Systems  Constitutional: Positive for malaise/fatigue. Negative for weight loss.  Gastrointestinal: Negative for nausea and vomiting.  Musculoskeletal:       Negative muscle weakness  Endo/Heme/Allergies:       Polyphagia    PHYSICAL EXAM: Blood pressure 133/85, pulse 89, temperature 98.1 F (36.7 C), temperature source Oral, height 5\' 6"  (1.676 m), weight 254 lb (115.2 kg), SpO2 99 %. Body mass index is 41 kg/m. Physical Exam  Constitutional: She is oriented to person, place, and time. She appears well-developed and well-nourished.  Cardiovascular: Normal rate.  Pulmonary/Chest: Effort normal.  Musculoskeletal: Normal range of motion.  Neurological: She is oriented to person, place, and time.  Skin: Skin is warm and dry.  Psychiatric: She has a normal mood and affect. Her behavior is normal.  Vitals reviewed.   RECENT LABS AND TESTS: BMET    Component Value Date/Time   NA 137 06/18/2017 1049   K 4.7 06/18/2017 1049   CL 102 06/18/2017 1049   CO2 20 06/18/2017 1049   GLUCOSE 94 06/18/2017 1049   GLUCOSE 106 (H) 06/13/2016 1540   BUN 16 06/18/2017 1049   CREATININE 0.83 06/18/2017 1049   CALCIUM 9.1 06/18/2017 1049   GFRNONAA 85 06/18/2017 1049   GFRAA 98 06/18/2017 1049   Lab Results  Component Value Date   HGBA1C 5.3 06/18/2017   Lab Results  Component Value Date   INSULIN 10.0 06/18/2017   CBC    Component Value Date/Time   WBC 5.0 06/18/2017 1049   WBC 6.1 06/13/2016 1540   RBC 4.67 06/18/2017 1049   RBC 4.41 06/13/2016 1540   HGB 12.9 06/18/2017 1049   HCT 39.9 06/18/2017 1049   PLT 223 06/13/2016 1540   MCV 85 06/18/2017 1049   MCH 27.6 06/18/2017 1049   MCH 28.8 06/13/2016 1540   MCHC 32.3 06/18/2017 1049   MCHC 32.8 06/13/2016 1540   RDW 14.8 06/18/2017 1049   LYMPHSABS 2.0 06/18/2017 1049   MONOABS 0.6 06/13/2016 1540   EOSABS 0.1 06/18/2017 1049   BASOSABS 0.0 06/18/2017 1049    Iron/TIBC/Ferritin/ %Sat No results found for: IRON, TIBC, FERRITIN, IRONPCTSAT Lipid Panel     Component Value Date/Time   CHOL 207 (H) 06/18/2017 1049   TRIG 137 06/18/2017 1049   HDL 52 06/18/2017 1049   LDLCALC 128 (H) 06/18/2017 1049   Hepatic Function Panel     Component Value Date/Time   PROT 7.4 06/18/2017 1049   ALBUMIN 4.3 06/18/2017 1049   AST 26  06/18/2017 1049   ALT 12 06/18/2017 1049   ALKPHOS 70 06/18/2017 1049   BILITOT 0.3 06/18/2017 1049      Component Value Date/Time   TSH 2.060 06/18/2017 1049     Ref. Range 06/18/2017 10:49  Vitamin D, 25-Hydroxy Latest Ref Range: 30.0 - 100.0 ng/mL 30.4    ASSESSMENT AND PLAN: Insulin resistance - Plan: metFORMIN (GLUCOPHAGE) 500 MG tablet  Vitamin D deficiency - Plan: Vitamin D, Ergocalciferol, (DRISDOL) 50000 units CAPS capsule  Class 3 severe obesity with serious comorbidity and body mass index (BMI) of 40.0 to 44.9 in adult, unspecified obesity type (HCC)  PLAN:  Insulin Resistance Okey RegalCarol will continue to work on weight loss, exercise, and decreasing simple carbohydrates in her diet to help decrease the risk of diabetes. We dicussed metformin including benefits and risks. She was informed that eating too many simple carbohydrates or too many calories at one sitting increases the likelihood of GI side effects. Okey RegalCarol agrees to continue metformin 500 mg bid #60 with no refills for now and follow up with us as directed to monitor her progress.  Vitamin D Deficiency Okey RegalCarol was informed that low vitamin D levels contributes to fatigue and are associated with obesity, breast, and colon cancer. She agrees to continue to take prescription Vit D @50 ,000 IU every week #4 with no refills and will follow up for routine testing of vitamin D, at least 2-3 times per year. She was informed of the risk of over-replacement of vitamin D and agrees to not increase her dose unless he discusses this with us first. Okey RegalCarol agrees to follow  up with our clinic in 2 weeks.  Obesity Okey RegalCarol is currently in the action stage of change. As such, her goal is to continue with weight loss efforts She has agreed to keep a food journal with 1200 to 1500 calories and 80+ grams of protein daily Okey RegalCarol has been instructed to work up to a goal of 150 minutes of combined cardio and strengthening exercise per week for weight loss and overall health benefits. We discussed the following Behavioral Modification Strategies today: keep a strict food journal, increasing lean protein intake and work on meal planning and easy cooking plans  Okey RegalCarol has agreed to follow up with our clinic in 2 weeks. She was informed of the importance of frequent follow up visits to maximize her success with intensive lifestyle modifications for her multiple health conditions.    OBESITY BEHAVIORAL INTERVENTION VISIT  Today's visit was # 12 out of 22.  Starting weight: 253 lbs Starting date: 06/18/17 Today's weight : 254 lbs  Today's date: 12/07/2017 Total lbs lost to date: 0 (Patients must lose 7 lbs in the first 6 months to continue with counseling)   ASK: We discussed the diagnosis of obesity with Onalee Huaarol E Warf today and Okey RegalCarol agreed to give us permission to discuss obesity behavioral modification therapy today.  ASSESS: Okey RegalCarol has the diagnosis of obesity and her BMI today is 41.02 Okey RegalCarol is in the action stage of change   ADVISE: Okey RegalCarol was educated on the multiple health risks of obesity as well as the benefit of weight loss to improve her health. She was advised of the need for long term treatment and the importance of lifestyle modifications.  AGREE: Multiple dietary modification options and treatment options were discussed and  Okey RegalCarol agreed to keep a food journal with 1200 to 1500 calories and 80+ grams of protein daily We discussed the following Behavioral Modification Strategies today: keep a  strict food journal, increasing lean protein intake and work on  meal planning and easy cooking plans  I, [place] Crane, am acting as transcriptionist for Quillian Quince, MD  I have reviewed the above documentation for accuracy and completeness, and I agree with the above. -Quillian Quince, MD

## 2017-12-17 ENCOUNTER — Ambulatory Visit (INDEPENDENT_AMBULATORY_CARE_PROVIDER_SITE_OTHER): Payer: BLUE CROSS/BLUE SHIELD | Admitting: Physician Assistant

## 2017-12-17 VITALS — BP 126/83 | HR 78 | Temp 98.2°F | Ht 66.0 in | Wt 258.0 lb

## 2017-12-17 DIAGNOSIS — Z6841 Body Mass Index (BMI) 40.0 and over, adult: Secondary | ICD-10-CM

## 2017-12-17 DIAGNOSIS — E559 Vitamin D deficiency, unspecified: Secondary | ICD-10-CM | POA: Diagnosis not present

## 2017-12-17 NOTE — Progress Notes (Addendum)
Office: [phone]  /  Fax: [phone]   HPI:   Chief Complaint: OBESITY Kim Watson is here to discuss her progress with her obesity treatment plan. She is on the keep a food journal with 1200-1500 calories and 80+ grams of protein daily and is following her eating plan approximately 95 % of the time. She states she is exercising 0 minutes 0 times per week. Kim Watson has not been following the meal plan and is not as mindful of her eating. She would like more meal planning ideas. She is ready to get back on track and continue with weight loss.  Her weight is 258 lb (117 kg) today and has gained 4 pounds since her last visit. She has lost 0 lbs since starting treatment with [place].  Vitamin D deficiency Kim Watson has a diagnosis of vitamin D deficiency. She is currently taking prescription Vit D and denies nausea, vomiting or muscle weakness.  ALLERGIES: Allergies  Allergen Reactions  . Percocet [Oxycodone-Acetaminophen]     Chills, nausea    MEDICATIONS: Current Outpatient Medications on File Prior to Visit  Medication Sig Dispense Refill  . acetaminophen (TYLENOL) 325 MG tablet Take 650 mg by mouth every 6 (six) hours as needed for mild pain.     . busPIRone (BUSPAR) 10 MG tablet Take 10 mg by mouth 3 (three) times daily.    . cholecalciferol (VITAMIN D) 1000 UNITS tablet Take 1,000 Units by mouth daily.    . clonazePAM (KLONOPIN) 0.5 MG tablet Take 0.5 mg by mouth 2 (two) times daily as needed for anxiety.    Marland Kitchen FLUoxetine (PROZAC) 40 MG capsule Take 40 mg by mouth daily.    . metFORMIN (GLUCOPHAGE) 500 MG tablet Take 1 tablet (500 mg total) by mouth 2 (two) times daily with a meal. 60 tablet 1  . Multiple Vitamin (MULTIVITAMIN WITH MINERALS) TABS tablet Take 1 tablet by mouth daily.    . pantoprazole (PROTONIX) 40 MG tablet Take 40 mg by mouth daily.    . valACYclovir (VALTREX) 500 MG tablet Take 500 mg by mouth 2 (two) times daily.    . Vitamin D, Ergocalciferol, (DRISDOL) 50000 units CAPS  capsule Take 1 capsule (50,000 Units total) by mouth every 7 (seven) days. 4 capsule 0  . Wheat Dextrin (BENEFIBER DRINK MIX) PACK Take 1 Package by mouth daily.     No current facility-administered medications on file prior to visit.     PAST MEDICAL HISTORY: Past Medical History:  Diagnosis Date  . Anxiety   . Arthritis   . Depression   . Gallbladder problem   . GERD (gastroesophageal reflux disease)   . High blood pressure   . Hypertension   . Migraine   . Obesity   . Obstructive sleep apnea   . Vitamin D deficiency     PAST SURGICAL HISTORY: Past Surgical History:  Procedure Laterality Date  . CHOLECYSTECTOMY    . SLEEVE GASTROPLASTY    . TUBAL LIGATION      SOCIAL HISTORY: Social History   Tobacco Use  . Smoking status: Never Smoker  . Smokeless tobacco: Never Used  Substance Use Topics  . Alcohol use: Yes    Comment: socially  . Drug use: No    FAMILY HISTORY: Family History  Problem Relation Age of Onset  . Diabetes Other   . Hyperlipidemia Other   . Hypertension Other     ROS: Review of Systems  Constitutional: Negative for weight loss.  Gastrointestinal: Negative for nausea and  vomiting.  Musculoskeletal:       Negative muscle weakness    PHYSICAL EXAM: Blood pressure 126/83, pulse 78, temperature 98.2 F (36.8 C), temperature source Oral, height 5\' 6"  (1.676 m), weight 258 lb (117 kg), SpO2 98 %. Body mass index is 41.64 kg/m. Physical Exam  Constitutional: She is oriented to person, place, and time. She appears well-developed and well-nourished.  Cardiovascular: Normal rate.  Pulmonary/Chest: Effort normal.  Musculoskeletal: Normal range of motion.  Neurological: She is oriented to person, place, and time.  Skin: Skin is warm and dry.  Psychiatric: She has a normal mood and affect. Her behavior is normal.  Vitals reviewed.   RECENT LABS AND TESTS: BMET    Component Value Date/Time   NA 137 06/18/2017 1049   K 4.7 06/18/2017  1049   CL 102 06/18/2017 1049   CO2 20 06/18/2017 1049   GLUCOSE 94 06/18/2017 1049   GLUCOSE 106 (H) 06/13/2016 1540   BUN 16 06/18/2017 1049   CREATININE 0.83 06/18/2017 1049   CALCIUM 9.1 06/18/2017 1049   GFRNONAA 85 06/18/2017 1049   GFRAA 98 06/18/2017 1049   Lab Results  Component Value Date   HGBA1C 5.3 06/18/2017   Lab Results  Component Value Date   INSULIN 10.0 06/18/2017   CBC    Component Value Date/Time   WBC 5.0 06/18/2017 1049   WBC 6.1 06/13/2016 1540   RBC 4.67 06/18/2017 1049   RBC 4.41 06/13/2016 1540   HGB 12.9 06/18/2017 1049   HCT 39.9 06/18/2017 1049   PLT 223 06/13/2016 1540   MCV 85 06/18/2017 1049   MCH 27.6 06/18/2017 1049   MCH 28.8 06/13/2016 1540   MCHC 32.3 06/18/2017 1049   MCHC 32.8 06/13/2016 1540   RDW 14.8 06/18/2017 1049   LYMPHSABS 2.0 06/18/2017 1049   MONOABS 0.6 06/13/2016 1540   EOSABS 0.1 06/18/2017 1049   BASOSABS 0.0 06/18/2017 1049   Iron/TIBC/Ferritin/ %Sat No results found for: IRON, TIBC, FERRITIN, IRONPCTSAT Lipid Panel     Component Value Date/Time   CHOL 207 (H) 06/18/2017 1049   TRIG 137 06/18/2017 1049   HDL 52 06/18/2017 1049   LDLCALC 128 (H) 06/18/2017 1049   Hepatic Function Panel     Component Value Date/Time   PROT 7.4 06/18/2017 1049   ALBUMIN 4.3 06/18/2017 1049   AST 26 06/18/2017 1049   ALT 12 06/18/2017 1049   ALKPHOS 70 06/18/2017 1049   BILITOT 0.3 06/18/2017 1049      Component Value Date/Time   TSH 2.060 06/18/2017 1049  Results for DONZELLA, CARROL (MRN [id number]) as of 12/17/2017 15:26  Ref. Range 06/18/2017 10:49  Vitamin D, 25-Hydroxy Latest Ref Range: 30.0 - 100.0 ng/mL 30.4    ASSESSMENT AND PLAN: Vitamin D deficiency  Class 3 severe obesity with serious comorbidity and body mass index (BMI) of 40.0 to 44.9 in adult, unspecified obesity type (HCC)  PLAN:  Vitamin D Deficiency Adalia was informed that low vitamin D levels contributes to fatigue and are associated with  obesity, breast, and colon cancer. Danee agrees to continue taking prescription Vit D @50 ,000 IU every week #4 and will follow up for routine testing of vitamin D, at least 2-3 times per year. She was informed of the risk of over-replacement of vitamin D and agrees to not increase her dose unless she discusses this with [place] first. Ghalia agrees to follow up with our clinic in 2 weeks.  We spent > than 50% of  the 15 minute visit on the counseling as documented in the note.  Obesity Kim Watson is currently in the action stage of change. As such, her goal is to continue with weight loss efforts She has agreed to change to keep a food journal with 500 calories and 40 grams of protein at supper daily and follow the Category 2 plan Kim Watson has been instructed to work up to a goal of 150 minutes of combined cardio and strengthening exercise per week for weight loss and overall health benefits. We discussed the following Behavioral Modification Strategies today: increasing lean protein intake and work on meal planning and easy cooking plans   Kim Watson has agreed to follow up with our clinic in 2 weeks. She was informed of the importance of frequent follow up visits to maximize her success with intensive lifestyle modifications for her multiple health conditions.    OBESITY BEHAVIORAL INTERVENTION VISIT  Today's visit was # 7 out of 22.  Starting weight: 253 lbs Starting date: 06/18/17 Today's weight : 258 lbs  Today's date: 12/17/2017 Total lbs lost to date: 0 (Patients must lose 7 lbs in the first 6 months to continue with counseling)   ASK: We discussed the diagnosis of obesity with Onalee Huaarol E Porte today and Kim Watson agreed to give us permission to discuss obesity behavioral modification therapy today.  ASSESS: Kim Watson has the diagnosis of obesity and her BMI today is 41.66 Kim Watson is in the action stage of change   ADVISE: Kim Watson was educated on the multiple health risks of obesity as well as the benefit of  weight loss to improve her health. She was advised of the need for long term treatment and the importance of lifestyle modifications.  AGREE: Multiple dietary modification options and treatment options were discussed and  Kim Watson agreed to the above obesity treatment plan.   Trude McburneyI, Sharon Martin, am acting as transcriptionist for Illa LevelSahar Charlina Dwight, PA-C I, Illa LevelSahar Derrious Bologna [facility], have reviewed this note and agree with its contents.  I have reviewed the above documentation for accuracy and completeness, and I agree with the above. -Quillian Quincearen Beasley, MD

## 2017-12-23 DIAGNOSIS — Z860100 Personal history of colon polyps, unspecified: Secondary | ICD-10-CM | POA: Insufficient documentation

## 2017-12-31 ENCOUNTER — Ambulatory Visit (INDEPENDENT_AMBULATORY_CARE_PROVIDER_SITE_OTHER): Payer: BLUE CROSS/BLUE SHIELD | Admitting: Physician Assistant

## 2017-12-31 ENCOUNTER — Encounter (INDEPENDENT_AMBULATORY_CARE_PROVIDER_SITE_OTHER): Payer: Self-pay

## 2018-03-26 DIAGNOSIS — E611 Iron deficiency: Secondary | ICD-10-CM | POA: Insufficient documentation

## 2018-05-13 DIAGNOSIS — M19012 Primary osteoarthritis, left shoulder: Secondary | ICD-10-CM | POA: Insufficient documentation

## 2018-05-21 DIAGNOSIS — B009 Herpesviral infection, unspecified: Secondary | ICD-10-CM | POA: Insufficient documentation

## 2018-08-31 DIAGNOSIS — J3089 Other allergic rhinitis: Secondary | ICD-10-CM | POA: Insufficient documentation

## 2018-08-31 DIAGNOSIS — G43709 Chronic migraine without aura, not intractable, without status migrainosus: Secondary | ICD-10-CM | POA: Insufficient documentation

## 2018-08-31 DIAGNOSIS — B002 Herpesviral gingivostomatitis and pharyngotonsillitis: Secondary | ICD-10-CM | POA: Insufficient documentation

## 2020-01-27 DIAGNOSIS — M4316 Spondylolisthesis, lumbar region: Secondary | ICD-10-CM | POA: Insufficient documentation

## 2020-06-05 ENCOUNTER — Emergency Department (HOSPITAL_COMMUNITY): Payer: BLUE CROSS/BLUE SHIELD

## 2020-06-05 ENCOUNTER — Emergency Department (HOSPITAL_COMMUNITY)
Admission: EM | Admit: 2020-06-05 | Discharge: 2020-06-05 | Disposition: A | Discharge status: Home / Self Care | Payer: BLUE CROSS/BLUE SHIELD | Attending: Emergency Medicine | Admitting: Emergency Medicine

## 2020-06-05 ENCOUNTER — Encounter (HOSPITAL_COMMUNITY): Payer: Self-pay

## 2020-06-05 ENCOUNTER — Other Ambulatory Visit: Payer: Self-pay

## 2020-06-05 DIAGNOSIS — R1031 Right lower quadrant pain: Secondary | ICD-10-CM | POA: Insufficient documentation

## 2020-06-05 DIAGNOSIS — I10 Essential (primary) hypertension: Secondary | ICD-10-CM | POA: Insufficient documentation

## 2020-06-05 DIAGNOSIS — R109 Unspecified abdominal pain: Secondary | ICD-10-CM

## 2020-06-05 LAB — CBC
HCT: 41.7 % (ref 36.0–46.0)
Hemoglobin: 13.2 g/dL (ref 12.0–15.0)
MCH: 28.1 pg (ref 26.0–34.0)
MCHC: 31.7 g/dL (ref 30.0–36.0)
MCV: 88.9 fL (ref 80.0–100.0)
Platelets: 334 10*3/uL (ref 150–400)
RBC: 4.69 MIL/uL (ref 3.87–5.11)
RDW: 14.6 % (ref 11.5–15.5)
WBC: 8.5 10*3/uL (ref 4.0–10.5)
nRBC: 0 % (ref 0.0–0.2)

## 2020-06-05 LAB — COMPREHENSIVE METABOLIC PANEL
ALT: 17 U/L (ref 0–44)
AST: 15 U/L (ref 15–41)
Albumin: 3.5 g/dL (ref 3.5–5.0)
Alkaline Phosphatase: 69 U/L (ref 38–126)
Anion gap: 7 (ref 5–15)
BUN: 9 mg/dL (ref 6–20)
CO2: 28 mmol/L (ref 22–32)
Calcium: 8.6 mg/dL — ABNORMAL LOW (ref 8.9–10.3)
Chloride: 101 mmol/L (ref 98–111)
Creatinine, Ser: 0.74 mg/dL (ref 0.44–1.00)
GFR calc Af Amer: >60 mL/min (ref >60)
GFR calc non Af Amer: >60 mL/min (ref >60)
Glucose, Bld: 85 mg/dL (ref 70–99)
Potassium: 4.5 mmol/L (ref 3.5–5.1)
Sodium: 136 mmol/L (ref 135–145)
Total Bilirubin: 0.4 mg/dL (ref 0.3–1.2)
Total Protein: 6.8 g/dL (ref 6.5–8.1)

## 2020-06-05 LAB — URINALYSIS, ROUTINE W REFLEX MICROSCOPIC
Bilirubin Urine: NEGATIVE
Glucose, UA: NEGATIVE mg/dL
Hgb urine dipstick: NEGATIVE
Ketones, ur: NEGATIVE mg/dL
Nitrite: NEGATIVE
Protein, ur: NEGATIVE mg/dL
Specific Gravity, Urine: 1.014 (ref 1.005–1.030)
pH: 5 (ref 5.0–8.0)

## 2020-06-05 LAB — I-STAT BETA HCG BLOOD, ED (MC, WL, AP ONLY): I-stat hCG, quantitative: <5 m[IU]/mL (ref <5)

## 2020-06-05 LAB — LIPASE, BLOOD: Lipase: 22 U/L (ref 11–51)

## 2020-06-05 MED ORDER — METHOCARBAMOL 500 MG PO TABS
500.0000 mg | ORAL_TABLET | Freq: Two times a day (BID) | ORAL | 0 refills | Status: DC
Start: 2020-06-05 — End: 2022-01-09

## 2020-06-05 MED ORDER — IOHEXOL 300 MG/ML  SOLN
100.0000 mL | Freq: Once | INTRAMUSCULAR | Status: AC | PRN
  Administered 2020-06-05: 100 mL via INTRAVENOUS

## 2020-06-05 MED ORDER — SODIUM CHLORIDE 0.9% FLUSH: 3.0000 mL | Freq: Once | INTRAVENOUS | Status: DC

## 2020-06-05 NOTE — Discharge Instructions (Addendum)
You may also use Tylenol and/or ibuprofen as directed

## 2020-06-05 NOTE — ED Triage Notes (Signed)
Patient c/o sharp right lower abdominal pain that started yesterday and pain has progressed.   Denies N/V or urinary symptoms.    A/Ox4 Ambulatory in triage

## 2020-06-05 NOTE — ED Provider Notes (Signed)
[facility]-EMERGENCY DEPT Provider Note   CSN: [id number] Arrival date & time: 06/05/20  1635     History Chief Complaint  Patient presents with  . Abdominal Pain    Kim Watson is a 49 y.o. female.  49 year old female presents with 2 days of right lower quadrant abdominal pain.  Pain is been colicky but then now persistent.  No fever chills but no nausea or vomiting.  No vaginal bleeding or discharge.  Denies any materia dysuria.  Pain is worse with certain movements.  No treatment use prior to arrival        Past Medical History:  Diagnosis Date  . Anxiety   . Arthritis   . Depression   . Gallbladder problem   . GERD (gastroesophageal reflux disease)   . High blood pressure   . Hypertension   . Migraine   . Obesity   . Obstructive sleep apnea   . Vitamin D deficiency     Patient Active Problem List   Diagnosis Date Noted  . Depression 07/27/2017  . Class 2 obesity with serious comorbidity and body mass index (BMI) of 39.0 to 39.9 in adult 07/13/2017  . Prediabetes 07/13/2017  . At risk for diabetes mellitus 07/02/2017  . Insulin resistance 07/02/2017  . Vitamin D deficiency 07/02/2017  . Other hyperlipidemia 07/02/2017  . Class 3 obesity with serious comorbidity and body mass index (BMI) of 40.0 to 44.9 in adult 06/18/2017    Past Surgical History:  Procedure Laterality Date  . CHOLECYSTECTOMY    . SLEEVE GASTROPLASTY    . TUBAL LIGATION       OB History    Gravida  3   Para  3   Term  3   Preterm      AB      Living        SAB      TAB      Ectopic      Multiple      Live Births              Family History  Problem Relation Age of Onset  . Diabetes Other   . Hyperlipidemia Other   . Hypertension Other     Social History   Tobacco Use  . Smoking status: Never Smoker  . Smokeless tobacco: Never Used  Substance Use Topics  . Alcohol use: Yes    Comment: socially  . Drug use: No    Home  Medications Prior to Admission medications   Medication Sig Start Date End Date Taking? Authorizing Provider  acetaminophen (TYLENOL) 325 MG tablet Take 650 mg by mouth every 6 (six) hours as needed for mild pain.     [provider]  busPIRone (BUSPAR) 10 MG tablet Take 10 mg by mouth 3 (three) times daily.    [provider]  cholecalciferol (VITAMIN D) 1000 UNITS tablet Take 1,000 Units by mouth daily.    [provider]  clonazePAM (KLONOPIN) 0.5 MG tablet Take 0.5 mg by mouth 2 (two) times daily as needed for anxiety.    [provider]  FLUoxetine (PROZAC) 40 MG capsule Take 40 mg by mouth daily.    [provider]  metFORMIN (GLUCOPHAGE) 500 MG tablet Take 1 tablet (500 mg total) by mouth 2 (two) times daily with a meal. 12/07/17   Quillian Quince D, MD  Multiple Vitamin (MULTIVITAMIN WITH MINERALS) TABS tablet Take 1 tablet by mouth daily.  [provider]  pantoprazole (PROTONIX) 40 MG tablet Take 40 mg by mouth daily.    [provider]  valACYclovir (VALTREX) 500 MG tablet Take 500 mg by mouth 2 (two) times daily.    [provider]  Vitamin D, Ergocalciferol, (DRISDOL) 50000 units CAPS capsule Take 1 capsule (50,000 Units total) by mouth every 7 (seven) days. 12/07/17   Wilder Glade, MD  Wheat Dextrin (BENEFIBER DRINK MIX) PACK Take 1 Package by mouth daily. 08/10/17   Wilder Glade, MD    Allergies    Percocet [oxycodone-acetaminophen]  Review of Systems   Review of Systems  All other systems reviewed and are negative.   Physical Exam Updated Vital Signs BP 135/83 (BP Location: Right Arm)   Pulse 78   Temp 98.5 F (36.9 C) (Oral)   Resp 17   LMP 03/06/2020   SpO2 98%   Physical Exam Vitals and nursing note reviewed.  Constitutional:      General: She is not in acute distress.    Appearance: Normal appearance. She is well-developed. She is not toxic-appearing.  HENT:     Head: Normocephalic  and atraumatic.  Eyes:     General: Lids are normal.     Conjunctiva/sclera: Conjunctivae normal.     Pupils: Pupils are equal, round, and reactive to light.  Neck:     Thyroid: No thyroid mass.     Trachea: No tracheal deviation.  Cardiovascular:     Rate and Rhythm: Normal rate and regular rhythm.     Heart sounds: Normal heart sounds. No murmur heard.  No gallop.   Pulmonary:     Effort: Pulmonary effort is normal. No respiratory distress.     Breath sounds: Normal breath sounds. No stridor. No decreased breath sounds, wheezing, rhonchi or rales.  Abdominal:     General: Bowel sounds are normal. There is no distension.     Palpations: Abdomen is soft.     Tenderness: There is abdominal tenderness in the right lower quadrant. There is no rebound.    Musculoskeletal:        General: No tenderness. Normal range of motion.     Cervical back: Normal range of motion and neck supple.  Skin:    General: Skin is warm and dry.     Findings: No abrasion or rash.  Neurological:     Mental Status: She is alert and oriented to person, place, and time.     GCS: GCS eye subscore is 4. GCS verbal subscore is 5. GCS motor subscore is 6.     Cranial Nerves: No cranial nerve deficit.     Sensory: No sensory deficit.  Psychiatric:        Speech: Speech normal.        Behavior: Behavior normal.     ED Results / Procedures / Treatments   Labs (all labs ordered are listed, but only abnormal results are displayed) Labs Reviewed  COMPREHENSIVE METABOLIC PANEL - Abnormal; Notable for the following components:      Result Value   Calcium 8.6 (*)    All other components within normal limits  URINALYSIS, ROUTINE W REFLEX MICROSCOPIC - Abnormal; Notable for the following components:   Leukocytes,Ua TRACE (*)    Bacteria, UA RARE (*)    All other components within normal limits  LIPASE, BLOOD  CBC  I-STAT BETA HCG BLOOD, ED ([facility], [facility], [facility] ONLY)    EKG None  Radiology No results  found.  Procedures Procedures (including critical care time)  Medications Ordered in ED Medications  sodium chloride flush (NS) 0.9 % injection 3 mL (has no administration in time range)    ED Course  I have reviewed the triage vital signs and the nursing notes.  Pertinent labs & imaging results that were available during my care of the patient were reviewed by me and considered in my medical decision making (see chart for details).    MDM Rules/Calculators/A&P                          Labs and abdominal CT without acute significant finding.  Urinalysis negative.  Will treat with muscle relaxants and discharged home Final Clinical Impression(s) / ED Diagnoses Final diagnoses:  None    Rx / DC Orders ED Discharge Orders    None       Lorre Nick, MD 06/05/20 2224

## 2020-07-18 DIAGNOSIS — R070 Pain in throat: Secondary | ICD-10-CM | POA: Insufficient documentation

## 2021-04-12 ENCOUNTER — Other Ambulatory Visit: Payer: Self-pay | Admitting: Nurse Practitioner

## 2021-04-12 DIAGNOSIS — R102 Pelvic and perineal pain: Secondary | ICD-10-CM

## 2021-05-09 ENCOUNTER — Ambulatory Visit
Admission: RE | Admit: 2021-05-09 | Discharge: 2021-05-09 | Disposition: A | Discharge status: Home / Self Care | Payer: BLUE CROSS/BLUE SHIELD | Source: Ambulatory Visit | Attending: Nurse Practitioner | Admitting: Nurse Practitioner

## 2021-05-09 DIAGNOSIS — R102 Pelvic and perineal pain: Secondary | ICD-10-CM

## 2022-01-08 ENCOUNTER — Other Ambulatory Visit: Payer: Self-pay

## 2022-01-08 NOTE — Progress Notes (Signed)
New Patient Office Visit  Subjective:  Patient ID: Kim Watson, female    DOB: 1971-04-20  Age: 51 y.o. MRN: [id number]  CC:  Chief Complaint  Patient presents with   Establish Care    Np. Est care. Pt c/o bursitis and arthritis in left shoulder     HPI Kim Watson presents for new patient visit to establish care.  Introduced to Designer, jewellery role and practice setting.  All questions answered.  Discussed provider/patient relationship and expectations.  She has a history of chronic left shoulder pain and was diagnosed with arthritis and bursitis. Severe endorses pain and it hurts to roll over in bed at night. She has been taking tylenol, using voltaren gel and ice. She has a history of bariatric surgery and can't take NSAIDs. She has been to orthopedics in the past and received steroid injections which really help with the pain. She is requesting an injection today.   She also started experiencing neck pain for about 2 weeks. She states that when she tilts her head back, she can only go so far before it starts hurting and causes numbness down her entire left arm. She has been seeing a chiropractor and using a tens machine. Can't take NSAIDs due to gastric bypass.   She has a history of dry mouth, however it has gotten worse over the last 2 months. She states that her mouth sticks together at times. She has been drinking increased fluids but if she drinks too much water, it upsets her stomach.   Past Medical History:  Diagnosis Date   Anxiety    Arthritis    Depression    Gallbladder problem    GERD (gastroesophageal reflux disease)    High blood pressure    Hypertension    Migraine    Obesity    Obstructive sleep apnea    Vitamin D deficiency     Past Surgical History:  Procedure Laterality Date   CHOLECYSTECTOMY     SLEEVE GASTROPLASTY     TUBAL LIGATION      Family History  Problem Relation Age of Onset   Diabetes Maternal Grandmother    Heart disease  Maternal Grandmother    Diabetes Other    Hyperlipidemia Other    Hypertension Other     Social History   Socioeconomic History   Marital status: Single    Spouse name: Not on file   Number of children: Not on file   Years of education: Not on file   Highest education level: Not on file  Occupational History   Occupation: Waitress  Tobacco Use   Smoking status: Never   Smokeless tobacco: Never  Vaping Use   Vaping Use: Never used  Substance and Sexual Activity   Alcohol use: Yes    Comment: socially   Drug use: No   Sexual activity: Not on file  Other Topics Concern   Not on file  Social History Narrative   Not on file   Social Determinants of Health   Financial Resource Strain: Not on file  Food Insecurity: Not on file  Transportation Needs: Not on file  Physical Activity: Not on file  Stress: Not on file  Social Connections: Not on file  Intimate Partner Violence: Not on file    ROS Review of Systems  Constitutional:  Positive for fatigue.  HENT:         Dry mouth  Eyes: Negative.   Respiratory: Negative.    Cardiovascular: Negative.  Gastrointestinal:  Positive for abdominal pain (gassy taste in mouth) and diarrhea (IBS with diarrhea). Negative for constipation.  Genitourinary: Negative.   Musculoskeletal:  Positive for arthralgias.  Skin:        Purple spots on her legs- don't itch or hurt  Neurological:  Positive for headaches. Negative for dizziness.  Psychiatric/Behavioral:  Positive for dysphoric mood. The patient is nervous/anxious.    Objective:   Today's Vitals: BP (!) 156/92    Pulse 80    Temp (!) 96.8 F (36 C) (Temporal)    Ht 5\' 5"  (1.651 m)    Wt 281 lb 3.2 oz (127.6 kg)    SpO2 98%    BMI 46.79 kg/m   Physical Exam Vitals and nursing note reviewed.  Constitutional:      General: She is not in acute distress.    Appearance: Normal appearance.  HENT:     Head: Normocephalic.  Eyes:     Conjunctiva/sclera: Conjunctivae normal.   Cardiovascular:     Rate and Rhythm: Normal rate and regular rhythm.     Pulses: Normal pulses.     Heart sounds: Normal heart sounds.  Pulmonary:     Effort: Pulmonary effort is normal.     Breath sounds: Normal breath sounds.  Musculoskeletal:        General: Tenderness (left posterior shoulder) present. No swelling.     Cervical back: Normal range of motion.     Comments: Limited ROM in lumbar spine due to pain  Skin:    General: Skin is warm.  Neurological:     General: No focal deficit present.     Mental Status: She is alert and oriented to person, place, and time.  Psychiatric:        Mood and Affect: Mood normal.        Behavior: Behavior normal.        Thought Content: Thought content normal.        Judgment: Judgment normal.    Assessment & Plan:   Problem List Items Addressed This Visit       Digestive   Dry mouth    With extreme dry mouth, will check ANA with reflex. She does not appear to be taking any medications that would have this side effect. She can try biotene dry mouth rinse.       Relevant Orders   ANA w/Reflex   Gastroesophageal reflux disease    Chronic, stable. Well controlled with protonix daily. Refill sent to the pharmacy.       Relevant Medications   pantoprazole (PROTONIX) 40 MG tablet     Musculoskeletal and Integument   Bursitis of left shoulder - Primary    Chronic, ongoing. Shoulder xray results reviewed from Dr. Val Riles note with orthopedics. Last steroid injection was 03/2021. Left shoulder injection given as stated below. Monitor for signs of infection, including warmth, fever, increased pain. Follow up in 4 weeks.         Other   Vitamin D deficiency    Check vitamin today and treat based on results.       Relevant Orders   VITAMIN D 25 Hydroxy (Vit-D Deficiency, Fractures)   Other hyperlipidemia    Check lipid panel today and treat based on results.       Relevant Orders   Lipid panel   Prediabetes    Check A1C  today and treat based on results.       Relevant Orders   CBC with  Differential/Platelet   Comprehensive metabolic panel   Hemoglobin A1c   TSH   History of bariatric surgery    History of gastric sleeve. Will check CMP, CBC, iron panel, vitamin D, and vitamin B12.       Relevant Orders   VITAMIN D 25 Hydroxy (Vit-D Deficiency, Fractures)   Vitamin B12   Iron, TIBC and Ferritin Panel (Completed)   Elevated blood pressure reading    BP elevated at this visit 156/92. She states she checking her blood pressure at home and it is normally well controlled. BP may be elevated due to increased pain today. Continue checking her blood pressure at home and writing it down. Follow up in 4 weeks.       Anxiety and depression    Chronic, ongoing. She has been using 1/2 tablet klonopin at night to help her relax and sleep. Discussed that benzodiazepines can be addicting. Discussed other medications for her symptoms including cymbalta, amitriptyline, zoloft, and trazodone. She would like to look into these medications before deciding. Follow up in 4 weeks.       Other Visit Diagnoses     Encounter for hepatitis C screening test for low risk patient       screen hepatitis C today   Relevant Orders   Hepatitis C antibody   Screening for HIV (human immunodeficiency virus)       Screen HIV today   Relevant Orders   HIV Antibody (routine testing w rflx)      Procedure: Left Shoulder Intraarticular Steroid Injection        Diagnosis: Left shoulder bursitis   Provider: Vance Peper, NP Consent:  Risks, benefits, and alternative treatments discussed and all questions were answered.  Patient elected to proceed and verbal consent obtained.  Description: Area prepped and draped using  semi-sterile technique.  Using a posterolateral approach a mixture of 3cc 1% lidocaine & 1 cc Kenalog 40 injected in shoulder joint.  A bandage was then placed over the injection site. Complications: none Post  Procedure Instructions: Wound care instructions discussed and patient was instructed to keep area clean and dry.  Signs and symptoms of infection discussed, patient agrees to contact the office ASAP should they occur.  Follow Up: Return in about 4 weeks (around 02/06/2022) for CPE.   Outpatient Encounter Medications as of 01/09/2022  Medication Sig   gabapentin (NEURONTIN) 300 MG capsule Take 1 capsule (300 mg total) by mouth at bedtime.   acetaminophen (TYLENOL) 325 MG tablet Take 650 mg by mouth every 6 (six) hours as needed for mild pain.    clonazePAM (KLONOPIN) 0.5 MG tablet Take 0.5 mg by mouth 2 (two) times daily as needed for anxiety.   Multiple Vitamin (MULTIVITAMIN WITH MINERALS) TABS tablet Take 1 tablet by mouth daily.   pantoprazole (PROTONIX) 40 MG tablet Take 1 tablet (40 mg total) by mouth daily.   valACYclovir (VALTREX) 500 MG tablet Take 500 mg by mouth 2 (two) times daily.   [DISCONTINUED] busPIRone (BUSPAR) 10 MG tablet Take 10 mg by mouth 3 (three) times daily.   [DISCONTINUED] cholecalciferol (VITAMIN D) 1000 UNITS tablet Take 1,000 Units by mouth daily.   [DISCONTINUED] FLUoxetine (PROZAC) 40 MG capsule Take 40 mg by mouth daily.   [DISCONTINUED] metFORMIN (GLUCOPHAGE) 500 MG tablet Take 1 tablet (500 mg total) by mouth 2 (two) times daily with a meal.   [DISCONTINUED] methocarbamol (ROBAXIN) 500 MG tablet Take 1 tablet (500 mg total) by mouth 2 (two) times daily.   [  DISCONTINUED] pantoprazole (PROTONIX) 40 MG tablet Take 40 mg by mouth daily.   [DISCONTINUED] Vitamin D, Ergocalciferol, (DRISDOL) 50000 units CAPS capsule Take 1 capsule (50,000 Units total) by mouth every 7 (seven) days.   [DISCONTINUED] Wheat Dextrin (BENEFIBER DRINK MIX) PACK Take 1 Package by mouth daily.   No facility-administered encounter medications on file as of 01/09/2022.    Follow-up: Return in about 4 weeks (around 02/06/2022) for CPE.   Charyl Dancer, NP

## 2022-01-09 ENCOUNTER — Encounter: Payer: Self-pay | Admitting: Nurse Practitioner

## 2022-01-09 ENCOUNTER — Ambulatory Visit (INDEPENDENT_AMBULATORY_CARE_PROVIDER_SITE_OTHER): Payer: 59 | Admitting: Nurse Practitioner

## 2022-01-09 VITALS — BP 156/92 | HR 80 | Temp 96.8°F | Ht 65.0 in | Wt 281.2 lb

## 2022-01-09 DIAGNOSIS — R682 Dry mouth, unspecified: Secondary | ICD-10-CM

## 2022-01-09 DIAGNOSIS — F419 Anxiety disorder, unspecified: Secondary | ICD-10-CM

## 2022-01-09 DIAGNOSIS — Z9884 Bariatric surgery status: Secondary | ICD-10-CM

## 2022-01-09 DIAGNOSIS — E559 Vitamin D deficiency, unspecified: Secondary | ICD-10-CM

## 2022-01-09 DIAGNOSIS — R030 Elevated blood-pressure reading, without diagnosis of hypertension: Secondary | ICD-10-CM

## 2022-01-09 DIAGNOSIS — F32A Depression, unspecified: Secondary | ICD-10-CM

## 2022-01-09 DIAGNOSIS — E7849 Other hyperlipidemia: Secondary | ICD-10-CM

## 2022-01-09 DIAGNOSIS — Z1159 Encounter for screening for other viral diseases: Secondary | ICD-10-CM

## 2022-01-09 DIAGNOSIS — K219 Gastro-esophageal reflux disease without esophagitis: Secondary | ICD-10-CM

## 2022-01-09 DIAGNOSIS — M7552 Bursitis of left shoulder: Secondary | ICD-10-CM

## 2022-01-09 DIAGNOSIS — Z114 Encounter for screening for human immunodeficiency virus [HIV]: Secondary | ICD-10-CM

## 2022-01-09 DIAGNOSIS — R7303 Prediabetes: Secondary | ICD-10-CM | POA: Diagnosis not present

## 2022-01-09 MED ORDER — PANTOPRAZOLE SODIUM 40 MG PO TBEC: 40.0000 mg | DELAYED_RELEASE_TABLET | Freq: Every day | ORAL | 1 refills | Status: DC

## 2022-01-09 MED ORDER — GABAPENTIN 300 MG PO CAPS: 300.0000 mg | ORAL_CAPSULE | Freq: Every day | ORAL | 0 refills | Status: DC

## 2022-01-09 NOTE — Patient Instructions (Signed)
It was great to see you!  Cymbalta: Antidepressant that helps with pain Amitriptyline: Antidepressant that helps with migraines Zoloft: Antidepressant that helps with sleep, crying spells Trazodone: Antidepressant that helps with sleep  Start gabapentin at bedtime to help your neck.  Let's follow-up in 4 weeks, sooner if you have concerns.  If a referral was placed today, you will be contacted for an appointment. Please note that routine referrals can sometimes take up to 3-4 weeks to process. Please call our office if you haven't heard anything after this time frame.  Take care,  Rodman Pickle, NP

## 2022-01-10 ENCOUNTER — Encounter: Payer: Self-pay | Admitting: Nurse Practitioner

## 2022-01-10 DIAGNOSIS — R030 Elevated blood-pressure reading, without diagnosis of hypertension: Secondary | ICD-10-CM | POA: Insufficient documentation

## 2022-01-10 DIAGNOSIS — F32A Depression, unspecified: Secondary | ICD-10-CM | POA: Insufficient documentation

## 2022-01-10 DIAGNOSIS — R682 Dry mouth, unspecified: Secondary | ICD-10-CM | POA: Insufficient documentation

## 2022-01-10 DIAGNOSIS — Z9884 Bariatric surgery status: Secondary | ICD-10-CM | POA: Insufficient documentation

## 2022-01-10 DIAGNOSIS — F419 Anxiety disorder, unspecified: Secondary | ICD-10-CM | POA: Insufficient documentation

## 2022-01-10 DIAGNOSIS — M7552 Bursitis of left shoulder: Secondary | ICD-10-CM | POA: Insufficient documentation

## 2022-01-10 DIAGNOSIS — K219 Gastro-esophageal reflux disease without esophagitis: Secondary | ICD-10-CM | POA: Insufficient documentation

## 2022-01-10 LAB — CBC WITH DIFFERENTIAL/PLATELET
Basophils Absolute: 0 10*3/uL (ref 0.0–0.1)
Basophils Relative: 0.7 % (ref 0.0–3.0)
Eosinophils Absolute: 0.2 10*3/uL (ref 0.0–0.7)
Eosinophils Relative: 2.8 % (ref 0.0–5.0)
HCT: 39.9 % (ref 36.0–46.0)
Hemoglobin: 13 g/dL (ref 12.0–15.0)
Lymphocytes Relative: 40.7 % (ref 12.0–46.0)
Lymphs Abs: 2.5 10*3/uL (ref 0.7–4.0)
MCHC: 32.6 g/dL (ref 30.0–36.0)
MCV: 85.6 fl (ref 78.0–100.0)
Monocytes Absolute: 0.4 10*3/uL (ref 0.1–1.0)
Monocytes Relative: 7.4 % (ref 3.0–12.0)
Neutro Abs: 2.9 10*3/uL (ref 1.4–7.7)
Neutrophils Relative %: 48.4 % (ref 43.0–77.0)
Platelets: 217 10*3/uL (ref 150.0–400.0)
RBC: 4.66 Mil/uL (ref 3.87–5.11)
RDW: 14.8 % (ref 11.5–15.5)
WBC: 6.1 10*3/uL (ref 4.0–10.5)

## 2022-01-10 LAB — COMPREHENSIVE METABOLIC PANEL
ALT: 30 U/L (ref 0–35)
AST: 25 U/L (ref 0–37)
Albumin: 3.9 g/dL (ref 3.5–5.2)
Alkaline Phosphatase: 63 U/L (ref 39–117)
BUN: 17 mg/dL (ref 6–23)
CO2: 26 mEq/L (ref 19–32)
Calcium: 9.1 mg/dL (ref 8.4–10.5)
Chloride: 106 mEq/L (ref 96–112)
Creatinine, Ser: 0.87 mg/dL (ref 0.40–1.20)
GFR: 77.7 mL/min (ref >60.00)
Glucose, Bld: 107 mg/dL — ABNORMAL HIGH (ref 70–99)
Potassium: 3.8 mEq/L (ref 3.5–5.1)
Sodium: 141 mEq/L (ref 135–145)
Total Bilirubin: 0.3 mg/dL (ref 0.2–1.2)
Total Protein: 6.9 g/dL (ref 6.0–8.3)

## 2022-01-10 LAB — LIPID PANEL
Cholesterol: 204 mg/dL — ABNORMAL HIGH (ref 0–200)
HDL: 48.1 mg/dL (ref >39.00)
NonHDL: 155.6
Total CHOL/HDL Ratio: 4
Triglycerides: 299 mg/dL — ABNORMAL HIGH (ref 0.0–149.0)
VLDL: 59.8 mg/dL — ABNORMAL HIGH (ref 0.0–40.0)

## 2022-01-10 LAB — VITAMIN D 25 HYDROXY (VIT D DEFICIENCY, FRACTURES): VITD: 16.08 ng/mL — ABNORMAL LOW (ref 30.00–100.00)

## 2022-01-10 LAB — IRON,TIBC AND FERRITIN PANEL
%SAT: 18 % (calc) (ref 16–45)
Ferritin: 25 ng/mL (ref 16–232)
Iron: 56 ug/dL (ref 45–160)
TIBC: 314 mcg/dL (calc) (ref 250–450)

## 2022-01-10 LAB — LDL CHOLESTEROL, DIRECT: Direct LDL: 132 mg/dL

## 2022-01-10 LAB — HEPATITIS C ANTIBODY
Hepatitis C Ab: NONREACTIVE
SIGNAL TO CUT-OFF: 0.03 (ref <1.00)

## 2022-01-10 LAB — VITAMIN B12: Vitamin B-12: 219 pg/mL (ref 211–911)

## 2022-01-10 LAB — TSH: TSH: 2.01 u[IU]/mL (ref 0.35–5.50)

## 2022-01-10 LAB — HIV ANTIBODY (ROUTINE TESTING W REFLEX): HIV 1&2 Ab, 4th Generation: NONREACTIVE

## 2022-01-10 LAB — ANA W/REFLEX: Anti Nuclear Antibody (ANA): NEGATIVE

## 2022-01-10 LAB — HEMOGLOBIN A1C: Hgb A1c MFr Bld: 5.6 % (ref 4.6–6.5)

## 2022-01-10 MED ORDER — VITAMIN D (ERGOCALCIFEROL) 1.25 MG (50000 UNIT) PO CAPS: 50000.0000 [IU] | ORAL_CAPSULE | ORAL | 0 refills | Status: DC

## 2022-01-10 NOTE — Assessment & Plan Note (Signed)
Check lipid panel today and treat based on results.  

## 2022-01-10 NOTE — Assessment & Plan Note (Signed)
With extreme dry mouth, will check ANA with reflex. She does not appear to be taking any medications that would have this side effect. She can try biotene dry mouth rinse.

## 2022-01-10 NOTE — Assessment & Plan Note (Signed)
Chronic, stable. Well controlled with protonix daily. Refill sent to the pharmacy.

## 2022-01-10 NOTE — Assessment & Plan Note (Signed)
Check vitamin today and treat based on results.

## 2022-01-10 NOTE — Addendum Note (Signed)
Addended by: Rodman Pickle A on: 01/10/2022 04:02 PM   Modules accepted: Orders

## 2022-01-10 NOTE — Assessment & Plan Note (Signed)
History of gastric sleeve. Will check CMP, CBC, iron panel, vitamin D, and vitamin B12.

## 2022-01-10 NOTE — Assessment & Plan Note (Signed)
BP elevated at this visit 156/92. She states she checking her blood pressure at home and it is normally well controlled. BP may be elevated due to increased pain today. Continue checking her blood pressure at home and writing it down. Follow up in 4 weeks.

## 2022-01-10 NOTE — Assessment & Plan Note (Signed)
Chronic, ongoing. She has been using 1/2 tablet klonopin at night to help her relax and sleep. Discussed that benzodiazepines can be addicting. Discussed other medications for her symptoms including cymbalta, amitriptyline, zoloft, and trazodone. She would like to look into these medications before deciding. Follow up in 4 weeks.

## 2022-01-10 NOTE — Assessment & Plan Note (Addendum)
Chronic, ongoing. Shoulder xray results reviewed from Dr. Drema Dallas note with orthopedics. Last steroid injection was 03/2021. Left shoulder injection given as stated below. Monitor for signs of infection, including warmth, fever, increased pain. Follow up in 4 weeks.

## 2022-01-10 NOTE — Assessment & Plan Note (Signed)
Check A1C today and treat based on results.  °

## 2022-01-13 ENCOUNTER — Encounter: Payer: Self-pay | Admitting: Nurse Practitioner

## 2022-01-13 DIAGNOSIS — E538 Deficiency of other specified B group vitamins: Secondary | ICD-10-CM

## 2022-01-13 DIAGNOSIS — M7552 Bursitis of left shoulder: Secondary | ICD-10-CM

## 2022-01-14 ENCOUNTER — Telehealth: Payer: Self-pay

## 2022-01-14 MED ORDER — CYANOCOBALAMIN 1000 MCG/ML IJ SOLN: 1000.0000 ug | INTRAMUSCULAR | Status: AC

## 2022-01-14 NOTE — Addendum Note (Signed)
Addended by: Rodman Pickle A on: 01/14/2022 10:25 AM   Modules accepted: Orders

## 2022-01-14 NOTE — Telephone Encounter (Signed)
Called and spoke to rep from [facility] (call ref# [id number]), rep stated patient does not need PA for gabapentin 300mg  or pantoprazole 40mg  tabs. Reached out to pharmacy to confirm correct ins is on file. Spoke to pharmacy rep, patient was able to pick up both medications w/o issue or needing PA.

## 2022-01-21 ENCOUNTER — Other Ambulatory Visit: Payer: Self-pay

## 2022-01-21 ENCOUNTER — Encounter: Payer: Self-pay | Admitting: Physical Therapy

## 2022-01-21 ENCOUNTER — Ambulatory Visit: Payer: 59 | Attending: Nurse Practitioner | Admitting: Physical Therapy

## 2022-01-21 DIAGNOSIS — R293 Abnormal posture: Secondary | ICD-10-CM | POA: Diagnosis present

## 2022-01-21 DIAGNOSIS — M62838 Other muscle spasm: Secondary | ICD-10-CM | POA: Diagnosis present

## 2022-01-21 DIAGNOSIS — M5412 Radiculopathy, cervical region: Secondary | ICD-10-CM | POA: Insufficient documentation

## 2022-01-21 DIAGNOSIS — M7552 Bursitis of left shoulder: Secondary | ICD-10-CM | POA: Diagnosis not present

## 2022-01-21 DIAGNOSIS — M6281 Muscle weakness (generalized): Secondary | ICD-10-CM | POA: Insufficient documentation

## 2022-01-21 NOTE — Therapy (Signed)
[facility] [address] [place], [place], [address] Phone: [phone]   Fax:  [phone]  Physical Therapy Evaluation  Patient Details  Name: Kim Watson MRN: [id number] Date of Birth: 10-15-1971 Referring Provider (PT): Gerre Scull   Encounter Date: 01/21/2022   PT End of Session - 01/21/22 1412     Visit Number 1    Number of Visits 20    Date for PT Re-Evaluation 04/01/22    PT Start Time 1225    PT Stop Time 1312    PT Time Calculation (min) 47 min    Activity Tolerance Patient limited by pain    Behavior During Therapy WFL for tasks assessed/performed             Past Medical History:  Diagnosis Date   Anxiety    Arthritis    Depression    Gallbladder problem    GERD (gastroesophageal reflux disease)    High blood pressure    Hypertension    Migraine    Obesity    Obstructive sleep apnea    Vitamin D deficiency     Past Surgical History:  Procedure Laterality Date   CHOLECYSTECTOMY     SLEEVE GASTROPLASTY     TUBAL LIGATION      There were no vitals filed for this visit.    Subjective Assessment - 01/21/22 1229     Subjective Patient reposts episode of L shoulder bursitis. She received an injection in the shoulder which has helped, but she reports tingling and numbness in L arm and pain/headaches with coughing, bending over, reaching, abd to 90 degrees. She wears a TNS unit, which helps, along with ice and heat. She sleeps with a towel roll under her neck. Has been seeing a Chiropractor who was working with her LBP and Thoracic pain, but has taken a break from that.DN was very effective for the acute pain and tightness in the past.    How long can you sit comfortably? N/a if she has trunk support.    How long can you stand comfortably? 15 minutes.    How long can you walk comfortably? N/A    Patient Stated Goals Decrease pain, make appropriate adjustments that will have a permanent.     Currently in Pain? Yes    Pain Score 5     Pain Location Neck    Pain Orientation Left;Posterior;Upper;Mid;Lower    Pain Descriptors / Indicators Sharp;Spasm;Aching    Pain Type Acute pain    Pain Radiating Towards goes into L hand.    Pain Onset 1 to 4 weeks ago    Pain Frequency Constant    Aggravating Factors  coughing, shoulder abd, bending    Pain Relieving Factors ice, TNS                [facility] PT Assessment - 01/21/22 0001       Assessment   Medical Diagnosis neck and L shoulder pain    Referring Provider (PT) McElwee, Lauren A    Prior Therapy for migraines, DN was beneficial      Balance Screen   Has the patient fallen in the past 6 months No      Home Environment   Living Environment Private residence    Living Arrangements Alone    Available Help at Discharge Family      Prior Function   Level of Independence Independent    Vocation Part time employment  Art therapist, Restaurant manager, fast food, 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    Leisure Play with grandchildren      Cognition   Overall Cognitive Status Within Functional Limits for tasks assessed      Sensation   Light Touch Impaired by gross assessment    Additional Comments patient reports N/T in LUE all the way to hand.Decreased LT on post shoulder      Posture/Postural Control   Posture Comments mildly forward head, elevated shoulders      ROM / Strength   AROM / PROM / Strength AROM;Strength      AROM   Overall AROM Comments patient reports N/T in hand with shoulder abd to 90 degrees. Overall WFL.    AROM Assessment Site Cervical    Cervical Flexion 90%    Cervical Extension 20    Cervical - Right Side Bend 50    Cervical - Left Side Bend 50    Cervical - Right Rotation 50    Cervical - Left Rotation 50      Strength   Overall Strength Within functional limits for tasks performed      Palpation   Palpation comment Patient demonstrates TTP and tightness in BUp  Traps,L>R, paraspinals, L rhomboids, scalenes, SCM, pect minor, pect major.      Special Tests    Special Tests Cervical    Cervical Tests Dictraction;other      Distraction Test   Findngs Positive    side Left    Comment Relief to pain and spasm      other    Findings Positive    Side Left    Comment Upper Limb Tension Test- patient had severe spasm in L Rhomboids/ Upper Traps with L shoulder abd, ER with forarm supination, wrist and finger ext.      Ambulation/Gait   Gait Comments No deviations noted.                        Objective measurements completed on examination: See above findings.                PT Education - 01/21/22 1412     Education Details POC    Person(s) Educated Patient    Methods Explanation    Comprehension Verbalized understanding              PT Short Term Goals - 01/21/22 1419       PT SHORT TERM GOAL #1   Title I with initial HEP.    Time 4    Period Weeks    Status New    Target Date 02/18/22               PT Long Term Goals - 01/21/22 1419       PT LONG TERM GOAL #1   Title I with final HEP    Time 10    Period Weeks    Status New    Target Date 04/01/22      PT LONG TERM GOAL #2   Title Patient will demosntrate cervical ROM of at least 80% full in all planes.    Baseline Limited in all planes, up to 50%    Time 10    Period Weeks    Status New    Target Date 04/01/22      PT LONG TERM GOAL #3   Title Patient will perform all work activities with L shoulder pain of <+  4/10.    Baseline Up to 8/10    Time 10    Period Weeks    Status New    Target Date 04/01/22      PT LONG TERM GOAL #4   Title Identify potential treatment options if pain, N/T begin to return- home cervical traction, nerve glides, etc.    Time 10    Period Weeks    Status New    Target Date 04/01/22                    Plan - 01/21/22 1413     Clinical Impression Statement Patient presents with H/O  migraines, R cervical pain and radiculopathy. She developed L shoulder pain, arm N/T, H/A about a month ago, which has exacerbated. She tested positive for cervical radiculopathy, demosntrates severe tihtness, TP, spasm in all musculature on L side of neck and shoulder, including scalenes, SCM, Up Traps, Rhomboid, Pect minor, paraspinals. she will benefit from PT to address the acute spasm, streth the tight muscles, strengthen weak muscles, improve posutral control. she reports she responded wel lto DN during her last experience.    Personal Factors and Comorbidities Fitness;Past/Current Experience    Examination-Activity Limitations Bathing;Reach Overhead;Carry;Lift;Hygiene/Grooming    Examination-Participation Restrictions Meal Prep;Occupation    Stability/Clinical Decision Making Evolving/Moderate complexity    Clinical Decision Making Moderate    Rehab Potential Good    PT Frequency Other (comment)   1-2x/w   PT Treatment/Interventions ADLs/Self Care Home Management;Iontophoresis 4mg /ml Dexamethasone;Moist Heat;Traction;Electrical Stimulation;Cryotherapy;Neuromuscular re-education;Manual techniques;Therapeutic exercise;Therapeutic activities;Functional mobility training;Patient/family education;Dry needling;Passive range of motion    PT Next Visit Plan Initiated HEP, DN?    Consulted and Agree with Plan of Care Patient             Patient will benefit from skilled therapeutic intervention in order to improve the following deficits and impairments:  Decreased range of motion, Increased fascial restricitons, Increased muscle spasms, Impaired UE functional use, Pain, Decreased strength, Decreased mobility, Increased edema, Postural dysfunction, Improper body mechanics, Impaired flexibility, Hypomobility  Visit Diagnosis: Radiculopathy, cervical region  Other muscle spasm  Muscle weakness (generalized)  Abnormal posture     Problem List Patient Active Problem List   Diagnosis Date  Noted   Bursitis of left shoulder 01/10/2022   Dry mouth 01/10/2022   History of bariatric surgery 01/10/2022   Elevated blood pressure reading 01/10/2022   Anxiety and depression 01/10/2022   Gastroesophageal reflux disease 01/10/2022   Class 2 obesity with serious comorbidity and body mass index (BMI) of 39.0 to 39.9 in adult 07/13/2017   Prediabetes 07/13/2017   At risk for diabetes mellitus 07/02/2017   Insulin resistance 07/02/2017   Vitamin D deficiency 07/02/2017   Other hyperlipidemia 07/02/2017   Class 3 obesity with serious comorbidity and body mass index (BMI) of 40.0 to 44.9 in adult 06/18/2017    06/20/2017, DPT 01/21/2022, 2:27 PM  [facility] [address] [place], [place], [place] Phone: [phone]   Fax:  [phone]  Name: Kim Watson MRN: Onalee Hua Date of Birth: 1971-03-24

## 2022-01-24 ENCOUNTER — Encounter: Payer: Self-pay | Admitting: Nurse Practitioner

## 2022-01-27 ENCOUNTER — Encounter: Payer: Self-pay | Admitting: Nurse Practitioner

## 2022-01-27 MED ORDER — VALACYCLOVIR HCL 500 MG PO TABS: 500.0000 mg | ORAL_TABLET | Freq: Two times a day (BID) | ORAL | 3 refills | Status: DC

## 2022-01-28 ENCOUNTER — Other Ambulatory Visit: Payer: Self-pay

## 2022-01-28 ENCOUNTER — Ambulatory Visit: Payer: 59 | Admitting: Physical Therapy

## 2022-01-28 DIAGNOSIS — M5412 Radiculopathy, cervical region: Secondary | ICD-10-CM

## 2022-01-28 DIAGNOSIS — M62838 Other muscle spasm: Secondary | ICD-10-CM

## 2022-01-28 NOTE — Therapy (Signed)
[facility] [place] [facility] [address] [place], [place], [address] Phone: [phone]   Fax:  [phone]  Physical Therapy Treatment  Patient Details  Name: Kim Watson MRN: [id number] Date of Birth: [birth year] Referring Provider (PT): Gerre Scull   Encounter Date: 01/28/2022   PT End of Session - 01/28/22 1315     Visit Number 2    Number of Visits 20    Date for PT Re-Evaluation 04/01/22    PT Start Time 1230    PT Stop Time 1315    PT Time Calculation (min) 45 min             Past Medical History:  Diagnosis Date   Anxiety    Arthritis    Depression    Gallbladder problem    GERD (gastroesophageal reflux disease)    High blood pressure    Hypertension    Migraine    Obesity    Obstructive sleep apnea    Vitamin D deficiency     Past Surgical History:  Procedure Laterality Date   CHOLECYSTECTOMY     SLEEVE GASTROPLASTY     TUBAL LIGATION      There were no vitals filed for this visit.   Subjective Assessment - 01/28/22 1245     Subjective pt arrived with good ROM but left shld hiked to ear. c/o radiating symptoms esp with leaning fwd and lying flat    Currently in Pain? Yes    Pain Score 10-Worst pain ever                               [facility] Adult PT Treatment/Exercise - 01/28/22 0001       Manual Therapy   Manual Therapy Passive ROM;Manual Traction;Myofascial release;Neural Stretch;Taping;Soft tissue mobilization    Soft tissue mobilization left trap and rhom    Myofascial Release left trap and rhom    Passive ROM Left UE full    Manual Traction cerv NE    Neural Stretch Negative    Kinesiotex Create Space              Trigger Point Dry Needling - 01/28/22 0001     Consent Given? Yes    Education Handout Provided Yes    Muscles Treated Head and Neck Upper trapezius    Muscles Treated Upper Quadrant Rhomboids    Upper Trapezius Response Twitch reponse  elicited;Palpable increased muscle length    Rhomboids Response Twitch response elicited;Palpable increased muscle length                     PT Short Term Goals - 01/21/22 1419       PT SHORT TERM GOAL #1   Title I with initial HEP.    Time 4    Period Weeks    Status New    Target Date 02/18/22               PT Long Term Goals - 01/21/22 1419       PT LONG TERM GOAL #1   Title I with final HEP    Time 10    Period Weeks    Status New    Target Date 04/01/22      PT LONG TERM GOAL #2   Title Patient will demosntrate cervical ROM of at least 80% full in all planes.    Baseline Limited in all  planes, up to 50%    Time 10    Period Weeks    Status New    Target Date 04/01/22      PT LONG TERM GOAL #3   Title Patient will perform all work activities with L shoulder pain of <+4/10.    Baseline Up to 8/10    Time 10    Period Weeks    Status New    Target Date 04/01/22      PT LONG TERM GOAL #4   Title Identify potential treatment options if pain, N/T begin to return- home cervical traction, nerve glides, etc.    Time 10    Period Weeks    Status New    Target Date 04/01/22                   Plan - 01/28/22 1316     Clinical Impression Statement very localized pain . good ROM LU, neg NT, NE with traction. Deep STW, MFR, DN and KTtape to work to relax RT UT and rhom and increase mobility as she is very tight and multi TP    PT Treatment/Interventions ADLs/Self Care Home Management;Iontophoresis 4mg /ml Dexamethasone;Moist Heat;Traction;Electrical Stimulation;Cryotherapy;Neuromuscular re-education;Manual techniques;Therapeutic exercise;Therapeutic activities;Functional mobility training;Patient/family education;Dry needling;Passive range of motion    PT Next Visit Plan assess tday tx and progress             Patient will benefit from skilled therapeutic intervention in order to improve the following deficits and impairments:  Decreased  range of motion, Increased fascial restricitons, Increased muscle spasms, Impaired UE functional use, Pain, Decreased strength, Decreased mobility, Increased edema, Postural dysfunction, Improper body mechanics, Impaired flexibility, Hypomobility  Visit Diagnosis: Radiculopathy, cervical region  Other muscle spasm     Problem List Patient Active Problem List   Diagnosis Date Noted   Bursitis of left shoulder 01/10/2022   Dry mouth 01/10/2022   History of bariatric surgery 01/10/2022   Elevated blood pressure reading 01/10/2022   Anxiety and depression 01/10/2022   Gastroesophageal reflux disease 01/10/2022   Class 2 obesity with serious comorbidity and body mass index (BMI) of 39.0 to 39.9 in adult 07/13/2017   Prediabetes 07/13/2017   At risk for diabetes mellitus 07/02/2017   Insulin resistance 07/02/2017   Vitamin D deficiency 07/02/2017   Other hyperlipidemia 07/02/2017   Class 3 obesity with serious comorbidity and body mass index (BMI) of 40.0 to 44.9 in adult 06/18/2017    Saretta Dahlem,ANGIE, PTA 01/28/2022, 1:18 PM  [facility] [address] [place], [place], [place] Phone: [phone]   Fax:  [phone]  Name: Kim Watson MRN: Onalee Hua Date of Birth: 01-01-1971

## 2022-01-28 NOTE — Patient Instructions (Signed)

## 2022-02-03 NOTE — Progress Notes (Unsigned)
There were no vitals taken for this visit.   Subjective:    Patient ID: Kim Watson, female    DOB: 02/01/1971, 51 y.o.   MRN: [id number]  HPI: Kim Watson is a 51 y.o. female presenting on 02/06/2022 for comprehensive medical examination. Current medical complaints include:{Blank single:19197::"none","***"}  She currently lives with: Menopausal Symptoms: {Blank single:19197::"yes","no"}  Depression Screen done today and results listed below:  Depression screen [facility] 2/9 01/09/2022 06/18/2017  Decreased Interest 3 3  Down, Depressed, Hopeless 3 3  PHQ - 2 Score 6 6  Altered sleeping 3 2  Tired, decreased energy 3 3  Change in appetite 3 3  Feeling bad or failure about yourself  3 2  Trouble concentrating 3 1  Moving slowly or fidgety/restless 3 0  Suicidal thoughts 1 0  PHQ-9 Score 25 17  Difficult doing work/chores Very difficult -    The patient {has/does not have:19849} a history of falls. I {did/did not:19850} complete a risk assessment for falls. A plan of care for falls {was/was not:19852} documented.   Past Medical History:  Past Medical History:  Diagnosis Date   Anxiety    Arthritis    Depression    Gallbladder problem    GERD (gastroesophageal reflux disease)    High blood pressure    Hypertension    Migraine    Obesity    Obstructive sleep apnea    Vitamin D deficiency     Surgical History:  Past Surgical History:  Procedure Laterality Date   CHOLECYSTECTOMY     SLEEVE GASTROPLASTY     TUBAL LIGATION      Medications:  Current Outpatient Medications on File Prior to Visit  Medication Sig   acetaminophen (TYLENOL) 325 MG tablet Take 650 mg by mouth every 6 (six) hours as needed for mild pain.    clonazePAM (KLONOPIN) 0.5 MG tablet Take 0.5 mg by mouth 2 (two) times daily as needed for anxiety.   gabapentin (NEURONTIN) 300 MG capsule Take 1 capsule (300 mg total) by mouth at bedtime.   Multiple Vitamin (MULTIVITAMIN WITH MINERALS) TABS tablet  Take 1 tablet by mouth daily.   pantoprazole (PROTONIX) 40 MG tablet Take 1 tablet (40 mg total) by mouth daily.   valACYclovir (VALTREX) 500 MG tablet Take 1 tablet (500 mg total) by mouth 2 (two) times daily.   Vitamin D, Ergocalciferol, (DRISDOL) 1.25 MG (50000 UNIT) CAPS capsule Take 1 capsule (50,000 Units total) by mouth every 7 (seven) days.   Current Facility-Administered Medications on File Prior to Visit  Medication   cyanocobalamin ((VITAMIN B-12)) injection 1,000 mcg    Allergies:  Allergies  Allergen Reactions   Percocet [Oxycodone-Acetaminophen]     Chills, nausea    Social History:  Social History   Socioeconomic History   Marital status: Single    Spouse name: Not on file   Number of children: Not on file   Years of education: Not on file   Highest education level: Not on file  Occupational History   Occupation: Waitress  Tobacco Use   Smoking status: Never   Smokeless tobacco: Never  Vaping Use   Vaping Use: Never used  Substance and Sexual Activity   Alcohol use: Yes    Comment: socially   Drug use: No   Sexual activity: Not on file  Other Topics Concern   Not on file  Social History Narrative   Not on file   Social Determinants of Health   Financial  Resource Strain: Not on file  Food Insecurity: Not on file  Transportation Needs: Not on file  Physical Activity: Not on file  Stress: Not on file  Social Connections: Not on file  Intimate Partner Violence: Not on file   Social History   Tobacco Use  Smoking Status Never  Smokeless Tobacco Never   Social History   Substance and Sexual Activity  Alcohol Use Yes   Comment: socially    Family History:  Family History  Problem Relation Age of Onset   Diabetes Maternal Grandmother    Heart disease Maternal Grandmother    Diabetes Other    Hyperlipidemia Other    Hypertension Other     Past medical history, surgical history, medications, allergies, family history and social history  reviewed with patient today and changes made to appropriate areas of the chart.   ROS All other ROS negative except what is listed above and in the HPI.      Objective:    There were no vitals taken for this visit.  Wt Readings from Last 3 Encounters:  01/09/22 281 lb 3.2 oz (127.6 kg)  12/17/17 258 lb (117 kg)  12/07/17 254 lb (115.2 kg)    Physical Exam  Results for orders placed or performed in visit on 01/09/22  CBC with Differential/Platelet  Result Value Ref Range   WBC 6.1 4.0 - 10.5 K/uL   RBC 4.66 3.87 - 5.11 Mil/uL   Hemoglobin 13.0 12.0 - 15.0 g/dL   HCT 71.0 62.6 - 94.8 %   MCV 85.6 78.0 - 100.0 fl   MCHC 32.6 30.0 - 36.0 g/dL   RDW 54.6 27.0 - 35.0 %   Platelets 217.0 150.0 - 400.0 K/uL   Neutrophils Relative % 48.4 43.0 - 77.0 %   Lymphocytes Relative 40.7 12.0 - 46.0 %   Monocytes Relative 7.4 3.0 - 12.0 %   Eosinophils Relative 2.8 0.0 - 5.0 %   Basophils Relative 0.7 0.0 - 3.0 %   Neutro Abs 2.9 1.4 - 7.7 K/uL   Lymphs Abs 2.5 0.7 - 4.0 K/uL   Monocytes Absolute 0.4 0.1 - 1.0 K/uL   Eosinophils Absolute 0.2 0.0 - 0.7 K/uL   Basophils Absolute 0.0 0.0 - 0.1 K/uL  Comprehensive metabolic panel  Result Value Ref Range   Sodium 141 135 - 145 mEq/L   Potassium 3.8 3.5 - 5.1 mEq/L   Chloride 106 96 - 112 mEq/L   CO2 26 19 - 32 mEq/L   Glucose, Bld 107 (H) 70 - 99 mg/dL   BUN 17 6 - 23 mg/dL   Creatinine, Ser 0.93 0.40 - 1.20 mg/dL   Total Bilirubin 0.3 0.2 - 1.2 mg/dL   Alkaline Phosphatase 63 39 - 117 U/L   AST 25 0 - 37 U/L   ALT 30 0 - 35 U/L   Total Protein 6.9 6.0 - 8.3 g/dL   Albumin 3.9 3.5 - 5.2 g/dL   GFR 81.82 >99.37 mL/min   Calcium 9.1 8.4 - 10.5 mg/dL  Hemoglobin J6R  Result Value Ref Range   Hgb A1c MFr Bld 5.6 4.6 - 6.5 %  Lipid panel  Result Value Ref Range   Cholesterol 204 (H) 0 - 200 mg/dL   Triglycerides 678.9 (H) 0.0 - 149.0 mg/dL   HDL 38.10 >17.51 mg/dL   VLDL 02.5 (H) 0.0 - 85.2 mg/dL   Total CHOL/HDL Ratio 4    NonHDL  155.60   VITAMIN D 25 Hydroxy (Vit-D Deficiency, Fractures)  Result Value Ref Range   VITD 16.08 (L) 30.00 - 100.00 ng/mL  Vitamin B12  Result Value Ref Range   Vitamin B-12 219 211 - 911 pg/mL  TSH  Result Value Ref Range   TSH 2.01 0.35 - 5.50 uIU/mL  Iron, TIBC and Ferritin Panel  Result Value Ref Range   Iron 56 45 - 160 mcg/dL   TIBC 081 448 - 185 mcg/dL (calc)   %SAT 18 16 - 45 % (calc)   Ferritin 25 16 - 232 ng/mL  ANA w/Reflex  Result Value Ref Range   Anti Nuclear Antibody (ANA) Negative Negative  HIV Antibody (routine testing w rflx)  Result Value Ref Range   HIV 1&2 Ab, 4th Generation NON-REACTIVE NON-REACTIVE  Hepatitis C antibody  Result Value Ref Range   Hepatitis C Ab NON-REACTIVE NON-REACTIVE   SIGNAL TO CUT-OFF 0.03 <1.00  LDL cholesterol, direct  Result Value Ref Range   Direct LDL 132.0 mg/dL      Assessment & Plan:   Problem List Items Addressed This Visit   None    Follow up plan: No follow-ups on file.   LABORATORY TESTING:  - Pap smear: {Blank single:19197::"pap done","not applicable","up to date","done elsewhere"}  IMMUNIZATIONS:   - Tdap: Tetanus vaccination status reviewed: {tetanus status:315746}. - Influenza: {Blank single:19197::"Up to date","Administered today","Postponed to flu season","Refused","Given elsewhere"} - Pneumovax: {Blank single:19197::"Up to date","Administered today","Not applicable","Refused","Given elsewhere"} - Prevnar: {Blank single:19197::"Up to date","Administered today","Not applicable","Refused","Given elsewhere"} - HPV: {Blank single:19197::"Up to date","Administered today","Not applicable","Refused","Given elsewhere"} - Zostavax vaccine: {Blank single:19197::"Up to date","Administered today","Not applicable","Refused","Given elsewhere"}  SCREENING: -Mammogram: {Blank single:19197::"Up to date","Ordered today","Not applicable","Refused","Done elsewhere"}  - Colonoscopy: Up to date  - Bone Density: Not  applicable  -Hearing Test: Not applicable  -Spirometry: Not applicable   PATIENT COUNSELING:   Advised to take 1 mg of folate supplement per day if capable of pregnancy.   Sexuality: Discussed sexually transmitted diseases, partner selection, use of condoms, avoidance of unintended pregnancy  and contraceptive alternatives.   Advised to avoid cigarette smoking.  I discussed with the patient that most people either abstain from alcohol or drink within safe limits (<=14/week and <=4 drinks/occasion for males, <=7/weeks and <= 3 drinks/occasion for females) and that the risk for alcohol disorders and other health effects rises proportionally with the number of drinks per week and how often a drinker exceeds daily limits.  Discussed cessation/primary prevention of drug use and availability of treatment for abuse.   Diet: Encouraged to adjust caloric intake to maintain  or achieve ideal body weight, to reduce intake of dietary saturated fat and total fat, to limit sodium intake by avoiding high sodium foods and not adding table salt, and to maintain adequate dietary potassium and calcium preferably from fresh fruits, vegetables, and low-fat dairy products.    stressed the importance of regular exercise  Injury prevention: Discussed safety belts, safety helmets, smoke detector, smoking near bedding or upholstery.   Dental health: Discussed importance of regular tooth brushing, flossing, and dental visits.    NEXT PREVENTATIVE PHYSICAL DUE IN 1 YEAR. No follow-ups on file.

## 2022-02-04 ENCOUNTER — Ambulatory Visit: Payer: 59 | Attending: Nurse Practitioner | Admitting: Physical Therapy

## 2022-02-04 ENCOUNTER — Ambulatory Visit: Payer: 59 | Admitting: Physical Therapy

## 2022-02-04 ENCOUNTER — Other Ambulatory Visit: Payer: Self-pay

## 2022-02-04 DIAGNOSIS — M6281 Muscle weakness (generalized): Secondary | ICD-10-CM | POA: Diagnosis present

## 2022-02-04 DIAGNOSIS — M62838 Other muscle spasm: Secondary | ICD-10-CM | POA: Insufficient documentation

## 2022-02-04 DIAGNOSIS — R293 Abnormal posture: Secondary | ICD-10-CM | POA: Diagnosis present

## 2022-02-04 DIAGNOSIS — M5412 Radiculopathy, cervical region: Secondary | ICD-10-CM | POA: Insufficient documentation

## 2022-02-04 NOTE — Therapy (Signed)
[facility] ?[address] ?[address] ?[place], [place], [address] ?Phone: [phone]   Fax:  [phone] ? ?Physical Therapy Treatment ? ?Patient Details  ?Name: Kim Watson ?MRN: [id number] ?Date of Birth: [birth year] ?Referring Provider (PT): Dwan Bolt, Lauren A ? ? ?Encounter Date: 02/04/2022 ? ? PT End of Session - 02/04/22 1356   ? ? Visit Number 3   ? Number of Visits 20   ? Date for PT Re-Evaluation 04/01/22   ? PT Start Time 1315   ? PT Stop Time [id number]   ? PT Time Calculation (min) 38 min   ? ?  ?  ? ?  ? ? ?Past Medical History:  ?Diagnosis Date  ? Anxiety   ? Arthritis   ? Depression   ? Gallbladder problem   ? GERD (gastroesophageal reflux disease)   ? High blood pressure   ? Hypertension   ? Migraine   ? Obesity   ? Obstructive sleep apnea   ? Vitamin D deficiency   ? ? ?Past Surgical History:  ?Procedure Laterality Date  ? CHOLECYSTECTOMY    ? SLEEVE GASTROPLASTY    ? TUBAL LIGATION    ? ? ?There were no vitals filed for this visit. ? ? Subjective Assessment - 02/04/22 1320   ? ? Subjective last session with tape and DN really helped. usual aches and pains   ? ?  ?  ? ?  ? ? ? ? ? ? ? ? ? ? ? ? ? ? ? ? ? ? ? ? [facility] Adult PT Treatment/Exercise - 02/04/22 0001   ? ?  ? Manual Therapy  ? Manual Therapy Soft tissue mobilization;Scapular mobilization;Passive ROM;Taping   ? Soft tissue mobilization left trap and rhom   with cupping  ? Myofascial Release left trap and rhom   ? Scapular Mobilization with stretching using door frame   ? [place]   ? ?  ?  ? ?  ? ? ? Trigger Point Dry Needling - 02/04/22 0001   ? ? Consent Given? Yes   ? Muscles Treated Upper Quadrant Teres major   ? Upper Trapezius Response Twitch reponse elicited;Palpable increased muscle length   ? Rhomboids Response Twitch response elicited;Palpable increased muscle length   ? Teres major Response Twitch response elicited;Palpable increased muscle length   ? ?  ?  ? ?  ? ? ? ? ? ? ? ? ? ?  PT Short Term Goals - 01/21/22 1419   ? ?  ? PT SHORT TERM GOAL #1  ? Title I with initial HEP.   ? Time 4   ? Period Weeks   ? Status New   ? Target Date 02/18/22   ? ?  ?  ? ?  ? ? ? ? PT Long Term Goals - 01/21/22 1419   ? ?  ? PT LONG TERM GOAL #1  ? Title I with final HEP   ? Time 10   ? Period Weeks   ? Status New   ? Target Date 04/01/22   ?  ? PT LONG TERM GOAL #2  ? Title Patient will demosntrate cervical ROM of at least 80% full in all planes.   ? Baseline Limited in all planes, up to 50%   ? Time 10   ? Period Weeks   ? Status New   ? Target Date 04/01/22   ?  ? PT LONG TERM GOAL #3  ? Title  Patient will perform all work activities with L shoulder pain of <+4/10.   ? Baseline Up to 8/10   ? Time 10   ? Period Weeks   ? Status New   ? Target Date 04/01/22   ?  ? PT LONG TERM GOAL #4  ? Title Identify potential treatment options if pain, N/T begin to return- home cervical traction, nerve glides, etc.   ? Time 10   ? Period Weeks   ? Status New   ? Target Date 04/01/22   ? ?  ?  ? ?  ? ? ? ? ? ? ? ? Plan - 02/04/22 1356   ? ? Clinical Impression Statement pt noteably looser with increased cerv ROM and less guarding isnce last session. DN and taping as she felt tremendous relief,added cupping and scap mobs/stretching using door frame   ? PT Next Visit Plan MT, check goals   ? ?  ?  ? ?  ? ? ?Patient will benefit from skilled therapeutic intervention in order to improve the following deficits and impairments:    ? ?Visit Diagnosis: ?Radiculopathy, cervical region ? ?Other muscle spasm ? ?Muscle weakness (generalized) ? ? ? ? ?Problem List ?Patient Active Problem List  ? Diagnosis Date Noted  ? Bursitis of left shoulder 01/10/2022  ? Dry mouth 01/10/2022  ? History of bariatric surgery 01/10/2022  ? Elevated blood pressure reading 01/10/2022  ? Anxiety and depression 01/10/2022  ? Gastroesophageal reflux disease 01/10/2022  ? Class 2 obesity with serious comorbidity and body mass index (BMI) of 39.0 to 39.9 in  adult 07/13/2017  ? Prediabetes 07/13/2017  ? At risk for diabetes mellitus 07/02/2017  ? Insulin resistance 07/02/2017  ? Vitamin D deficiency 07/02/2017  ? Other hyperlipidemia 07/02/2017  ? Class 3 obesity with serious comorbidity and body mass index (BMI) of 40.0 to 44.9 in adult 06/18/2017  ? ? ?Kyel Purk,ANGIE, PTA ?02/04/2022, 1:59 PM ? ?[facility] ?[address] ?[place], [place], [address] ?Phone: [phone]   Fax:  [phone] ? ?Name: Kim Watson ?MRN: [id number] ?Date of Birth: 09/13/1971 ? ? ? ?

## 2022-02-06 ENCOUNTER — Other Ambulatory Visit: Payer: Self-pay

## 2022-02-06 ENCOUNTER — Ambulatory Visit: Payer: 59 | Admitting: Physical Therapy

## 2022-02-06 ENCOUNTER — Encounter: Payer: Medicaid Other | Admitting: Nurse Practitioner

## 2022-02-06 DIAGNOSIS — M5412 Radiculopathy, cervical region: Secondary | ICD-10-CM | POA: Diagnosis not present

## 2022-02-06 DIAGNOSIS — M62838 Other muscle spasm: Secondary | ICD-10-CM

## 2022-02-06 NOTE — Therapy (Addendum)
[facility] ?[facility] [address] ?[place], [place], [address] ?Phone: [phone]   Fax:  [phone] ? ?Physical Therapy Treatment ? ?Patient Details  ?Name: Kim Watson ?MRN: [id number] ?Date of Birth: Jan 18, 1971 ?Referring Provider (PT): Dwan Bolt, Lauren A ? ? ?Encounter Date: 02/06/2022 ? ? PT End of Session - 02/06/22 1607   ? ? Visit Number 4   ? Number of Visits 20   ? Date for PT Re-Evaluation 04/01/22   ? PT Start Time 1530   ? PT Stop Time 1615   ? PT Time Calculation (min) 45 min   ? ?  ?  ? ?  ? ? ?Past Medical History:  ?Diagnosis Date  ? Anxiety   ? Arthritis   ? Depression   ? Gallbladder problem   ? GERD (gastroesophageal reflux disease)   ? High blood pressure   ? Hypertension   ? Migraine   ? Obesity   ? Obstructive sleep apnea   ? Vitamin D deficiency   ? ? ?Past Surgical History:  ?Procedure Laterality Date  ? CHOLECYSTECTOMY    ? SLEEVE GASTROPLASTY    ? TUBAL LIGATION    ? ? ?There were no vitals filed for this visit. ? ? Subjective Assessment - 02/06/22 1530   ? ? Subjective pt arrives stating tape did not hold this time. looser but continues to hold head in flexion   ? Currently in Pain? Yes   ? Pain Score 7    ? Pain Location Neck   ? Pain Orientation Left   ? ?  ?  ? ?  ? ? ? ? ? ? ? ? ? ? ? ? ? ? ? ? ? ? ? ? [facility] Adult PT Treatment/Exercise - 02/06/22 0001   ? ?  ? Modalities  ? Modalities Electrical Stimulation;Ultrasound   ?  ? Electrical Stimulation  ? Electrical Stimulation Parameters with [place]   ?  ? Ultrasound  ? Ultrasound Location left cerv,trap,rhom   ? Ultrasound Parameters 1.2 w/cm 2 100 % cont   ? Ultrasound Goals Other (Comment)   tightness  ?  ? Manual Therapy  ? Manual Therapy Soft tissue mobilization;Scapular mobilization;Passive ROM;Taping   ? Soft tissue mobilization left trap, sub scap and rhom   ? Myofascial Release left trap and rhom   thoracic spine around vertbrea  ? Scapular Mobilization with stretching   ? ?  ?  ? ?  ? ? ?  Trigger Point Dry Needling - 02/06/22 0001   ? ? Consent Given? Yes   ? Upper Trapezius Response Twitch reponse elicited;Palpable increased muscle length   ? Rhomboids Response Twitch response elicited;Palpable increased muscle length   ? ?  ?  ? ?  ? ? ? ? ? ? ? ? PT Education - 02/06/22 1607   ? ? Education Details educ on cerv positioning   ? Person(s) Educated Patient   ? Methods Explanation;Demonstration   ? Comprehension Verbalized understanding;Returned demonstration   ? ?  ?  ? ?  ? ? ? PT Short Term Goals - 02/06/22 1607   ? ?  ? PT SHORT TERM GOAL #1  ? Title I with initial HEP.   ? Status Achieved   ? ?  ?  ? ?  ? ? ? ? PT Long Term Goals - 01/21/22 1419   ? ?  ? PT LONG TERM GOAL #1  ? Title I with final HEP   ?  Time 10   ? Period Weeks   ? Status New   ? Target Date 04/01/22   ?  ? PT LONG TERM GOAL #2  ? Title Patient will demosntrate cervical ROM of at least 80% full in all planes.   ? Baseline Limited in all planes, up to 50%   ? Time 10   ? Period Weeks   ? Status New   ? Target Date 04/01/22   ?  ? PT LONG TERM GOAL #3  ? Title Patient will perform all work activities with L shoulder pain of <+4/10.   ? Baseline Up to 8/10   ? Time 10   ? Period Weeks   ? Status New   ? Target Date 04/01/22   ?  ? PT LONG TERM GOAL #4  ? Title Identify potential treatment options if pain, N/T begin to return- home cervical traction, nerve glides, etc.   ? Time 10   ? Period Weeks   ? Status New   ? Target Date 04/01/22   ? ?  ?  ? ?  ? ? ? ? ? ? ? ? Plan - 02/06/22 1607   ? ? Clinical Impression Statement STG met- educ on cerv posture and mech at work. added combo tx today and DN as it gives much relief and subscap work as it triggered NT. STW around cerv/thor vertebrea   ? PT Treatment/Interventions ADLs/Self Care Home Management;Iontophoresis 4mg /ml Dexamethasone;Moist Heat;Traction;Electrical Stimulation;Cryotherapy;Neuromuscular re-education;Manual techniques;Therapeutic exercise;Therapeutic  activities;Functional mobility training;Patient/family education;Dry needling;Passive range of motion   ? PT Next Visit Plan assess and progress   ? ?  ?  ? ?  ? ? ?Patient will benefit from skilled therapeutic intervention in order to improve the following deficits and impairments:  Decreased range of motion, Increased fascial restricitons, Increased muscle spasms, Impaired UE functional use, Pain, Decreased strength, Decreased mobility, Increased edema, Postural dysfunction, Improper body mechanics, Impaired flexibility, Hypomobility ? ?Visit Diagnosis: ?Radiculopathy, cervical region ? ?Other muscle spasm ? ? ? ? ?Problem List ?Patient Active Problem List  ? Diagnosis Date Noted  ? Bursitis of left shoulder 01/10/2022  ? Dry mouth 01/10/2022  ? History of bariatric surgery 01/10/2022  ? Elevated blood pressure reading 01/10/2022  ? Anxiety and depression 01/10/2022  ? Gastroesophageal reflux disease 01/10/2022  ? Class 2 obesity with serious comorbidity and body mass index (BMI) of 39.0 to 39.9 in adult 07/13/2017  ? Prediabetes 07/13/2017  ? At risk for diabetes mellitus 07/02/2017  ? Insulin resistance 07/02/2017  ? Vitamin D deficiency 07/02/2017  ? Other hyperlipidemia 07/02/2017  ? Class 3 obesity with serious comorbidity and body mass index (BMI) of 40.0 to 44.9 in adult 06/18/2017  ? ? Sumner Boast, PT ?02/06/2022, 5:01 PM ? ?[facility] ?[place] ?[address] ?[place], [place], [address] ?Phone: [phone]   Fax:  [phone] ? ?Name: KENZEY BIRKLAND ?MRN: [id number] ?Date of Birth: 07-01-1971 ? ? ? ?

## 2022-02-10 ENCOUNTER — Ambulatory Visit: Payer: 59 | Admitting: Physical Therapy

## 2022-02-10 ENCOUNTER — Other Ambulatory Visit: Payer: Self-pay

## 2022-02-10 ENCOUNTER — Encounter: Payer: Self-pay | Admitting: Physical Therapy

## 2022-02-10 DIAGNOSIS — M62838 Other muscle spasm: Secondary | ICD-10-CM

## 2022-02-10 DIAGNOSIS — M5412 Radiculopathy, cervical region: Secondary | ICD-10-CM | POA: Diagnosis not present

## 2022-02-10 DIAGNOSIS — R293 Abnormal posture: Secondary | ICD-10-CM

## 2022-02-10 DIAGNOSIS — M6281 Muscle weakness (generalized): Secondary | ICD-10-CM

## 2022-02-10 NOTE — Therapy (Signed)
[facility] ?[place] ?[address] ?[place], [place], [address] ?Phone: [phone]   Fax:  [phone] ? ?Physical Therapy Treatment ? ?Patient Details  ?Name: Kim Watson ?MRN: [id number] ?Date of Birth: 06/06/1971 ?Referring Provider (PT): Dwan Bolt, Lauren A ? ? ?Encounter Date: 02/10/2022 ? ? PT End of Session - 02/10/22 1418   ? ? Visit Number 5   ? Date for PT Re-Evaluation 04/01/22   ? PT Start Time 1345   ? PT Stop Time [id number]   ? PT Time Calculation (min) 42 min   ? Activity Tolerance Patient limited by pain   ? Behavior During Therapy [facility] for tasks assessed/performed   ? ?  ?  ? ?  ? ? ?Past Medical History:  ?Diagnosis Date  ? Anxiety   ? Arthritis   ? Depression   ? Gallbladder problem   ? GERD (gastroesophageal reflux disease)   ? High blood pressure   ? Hypertension   ? Migraine   ? Obesity   ? Obstructive sleep apnea   ? Vitamin D deficiency   ? ? ?Past Surgical History:  ?Procedure Laterality Date  ? CHOLECYSTECTOMY    ? SLEEVE GASTROPLASTY    ? TUBAL LIGATION    ? ? ?There were no vitals filed for this visit. ? ? Subjective Assessment - 02/10/22 1347   ? ? Subjective shoulder does feel better since starting therapy 75% better. Pinching in the biddle of shoulder and the top of her neck.   ? Currently in Pain? Yes   ? Pain Score 6    ? Pain Location Shoulder   ? Pain Orientation Left   ? ?  ?  ? ?  ? ? ? ? ? ? ? ? ? ? ? ? ? ? ? ? ? ? ? ? [facility] Adult PT Treatment/Exercise - 02/10/22 0001   ? ?  ? Modalities  ? Modalities Moist Heat   ?  ? Moist Heat Therapy  ? Number Minutes Moist Heat 10 Minutes   ? Moist Heat Location Shoulder;Cervical   ?  ? Manual Therapy  ? Manual Therapy Soft tissue mobilization;Scapular mobilization;Passive ROM;Taping   ? Soft tissue mobilization left trap, sub scap and rhom   ? Myofascial Release left trap and rhom   ? Scapular Mobilization with stretching   ? ?  ?  ? ?  ? ? ? ? ? ? ? ? ? ? ? ? PT Short Term Goals - 02/06/22 1607   ? ?  ?  PT SHORT TERM GOAL #1  ? Title I with initial HEP.   ? Status Achieved   ? ?  ?  ? ?  ? ? ? ? PT Long Term Goals - 02/10/22 1421   ? ?  ? PT LONG TERM GOAL #2  ? Title Patient will demosntrate cervical ROM of at least 80% full in all planes.   ? Status On-going   ?  ? PT LONG TERM GOAL #3  ? Title Patient will perform all work activities with L shoulder pain of <+4/10.   ? Status On-going   ? ?  ?  ? ?  ? ? ? ? ? ? ? ? Plan - 02/10/22 1422   ? ? Clinical Impression Statement pt reports 75% improvement overall since starting therapy. She continues to reports pain and tightness around her R scapula and shoulder. STM performed around cerv/thor vertebrae. Lead PT assisted in treatment performing DN  to the area.   ? Personal Factors and Comorbidities Fitness;Past/Current Experience   ? Examination-Activity Limitations Bathing;Reach Overhead;Carry;Lift;Hygiene/Grooming   ? Examination-Participation Restrictions Meal Prep;Occupation   ? Stability/Clinical Decision Making Evolving/Moderate complexity   ? Rehab Potential Good   ? PT Next Visit Plan assess and progress   ? ?  ?  ? ?  ? ? ?Patient will benefit from skilled therapeutic intervention in order to improve the following deficits and impairments:  Decreased range of motion, Increased fascial restricitons, Increased muscle spasms, Impaired UE functional use, Pain, Decreased strength, Decreased mobility, Increased edema, Postural dysfunction, Improper body mechanics, Impaired flexibility, Hypomobility ? ?Visit Diagnosis: ?Radiculopathy, cervical region ? ?Muscle weakness (generalized) ? ?Abnormal posture ? ?Other muscle spasm ? ? ? ? ?Problem List ?Patient Active Problem List  ? Diagnosis Date Noted  ? Bursitis of left shoulder 01/10/2022  ? Dry mouth 01/10/2022  ? History of bariatric surgery 01/10/2022  ? Elevated blood pressure reading 01/10/2022  ? Anxiety and depression 01/10/2022  ? Gastroesophageal reflux disease 01/10/2022  ? Class 2 obesity with serious  comorbidity and body mass index (BMI) of 39.0 to 39.9 in adult 07/13/2017  ? Prediabetes 07/13/2017  ? At risk for diabetes mellitus 07/02/2017  ? Insulin resistance 07/02/2017  ? Vitamin D deficiency 07/02/2017  ? Other hyperlipidemia 07/02/2017  ? Class 3 obesity with serious comorbidity and body mass index (BMI) of 40.0 to 44.9 in adult 06/18/2017  ? ? ?Scot Jun, PTA ?02/10/2022, 2:25 PM ? ? ?[facility] ?[address] ?[place], [place], [address] ?Phone: [phone]   Fax:  [phone] ? ?Name: Kim Watson ?MRN: [id number] ?Date of Birth: 05-12-1971 ? ? ? ?

## 2022-02-11 ENCOUNTER — Ambulatory Visit: Payer: 59 | Admitting: Physical Therapy

## 2022-02-11 ENCOUNTER — Other Ambulatory Visit (HOSPITAL_COMMUNITY)
Admission: RE | Admit: 2022-02-11 | Discharge: 2022-02-11 | Disposition: A | Discharge status: Home / Self Care | Payer: 59 | Source: Ambulatory Visit | Attending: Nurse Practitioner | Admitting: Nurse Practitioner

## 2022-02-11 ENCOUNTER — Encounter: Payer: Self-pay | Admitting: Nurse Practitioner

## 2022-02-11 ENCOUNTER — Ambulatory Visit (INDEPENDENT_AMBULATORY_CARE_PROVIDER_SITE_OTHER): Payer: 59 | Admitting: Nurse Practitioner

## 2022-02-11 VITALS — BP 104/78 | HR 80 | Temp 96.2°F | Ht 65.0 in | Wt 280.4 lb

## 2022-02-11 DIAGNOSIS — Z124 Encounter for screening for malignant neoplasm of cervix: Secondary | ICD-10-CM | POA: Diagnosis present

## 2022-02-11 DIAGNOSIS — M7552 Bursitis of left shoulder: Secondary | ICD-10-CM

## 2022-02-11 DIAGNOSIS — Z0001 Encounter for general adult medical examination with abnormal findings: Secondary | ICD-10-CM

## 2022-02-11 DIAGNOSIS — Z23 Encounter for immunization: Secondary | ICD-10-CM

## 2022-02-11 DIAGNOSIS — Z9884 Bariatric surgery status: Secondary | ICD-10-CM

## 2022-02-11 DIAGNOSIS — E88819 Insulin resistance, unspecified: Secondary | ICD-10-CM

## 2022-02-11 DIAGNOSIS — F419 Anxiety disorder, unspecified: Secondary | ICD-10-CM | POA: Diagnosis not present

## 2022-02-11 DIAGNOSIS — J40 Bronchitis, not specified as acute or chronic: Secondary | ICD-10-CM

## 2022-02-11 DIAGNOSIS — E8881 Metabolic syndrome: Secondary | ICD-10-CM

## 2022-02-11 DIAGNOSIS — R7303 Prediabetes: Secondary | ICD-10-CM

## 2022-02-11 DIAGNOSIS — F32A Depression, unspecified: Secondary | ICD-10-CM

## 2022-02-11 DIAGNOSIS — Z1231 Encounter for screening mammogram for malignant neoplasm of breast: Secondary | ICD-10-CM

## 2022-02-11 DIAGNOSIS — R030 Elevated blood-pressure reading, without diagnosis of hypertension: Secondary | ICD-10-CM

## 2022-02-11 MED ORDER — DULOXETINE HCL 30 MG PO CPEP: 30.0000 mg | ORAL_CAPSULE | Freq: Every day | ORAL | 2 refills | Status: DC

## 2022-02-11 MED ORDER — SEMAGLUTIDE(0.25 OR 0.5MG/DOS) 2 MG/1.5ML ~~LOC~~ SOPN
0.5000 mg | PEN_INJECTOR | SUBCUTANEOUS | 0 refills | Status: DC
Start: 2022-02-11 — End: 2022-02-24

## 2022-02-11 MED ORDER — INSULIN PEN NEEDLE 31G X 5 MM MISC: 1.0000 | 0 refills | Status: DC

## 2022-02-11 MED ORDER — PREDNISONE 10 MG PO TABS: ORAL_TABLET | ORAL | 0 refills | Status: DC

## 2022-02-11 NOTE — Assessment & Plan Note (Signed)
Chronic, ongoing.  Has improved since steroid injection last visit and physical therapy.  Continue physical therapy and follow-up in 6 to 8 weeks. ?

## 2022-02-11 NOTE — Assessment & Plan Note (Signed)
A1c was within normal limits last visit.  We will start semaglutide 0.25 mg weekly subcutaneous x4 weeks then 0.5 mg weekly.  Follow-up in 4 to 6 weeks.  Discussed diet and exercise. ?

## 2022-02-11 NOTE — Progress Notes (Signed)
? ?BP 104/78 (BP Location: Left Arm, Patient Position: Sitting, Cuff Size: Large)   Pulse 80   Temp (!) 96.2 ?F (35.7 ?C) (Temporal)   Ht 5\' 5"  (1.651 m)   Wt 280 lb 6.4 oz (127.2 kg)   SpO2 98%   BMI 46.66 kg/m?   ? ?Subjective:  ? ? Patient ID: Kim Watson, female    DOB: 06/05/1971, 51 y.o.   MRN: [id number] ? ?CC: ?Chief Complaint  ?Patient presents with  ?? Annual Exam  ?  CPE w/ pap. Pt is not fasting.  ? ?HPI: ?Kim Watson is a 51 y.o. female presenting on 02/11/2022 for comprehensive medical examination. Current medical complaints include: cough that has been going on since Sunday. Covid-19 test negative yesterday. ? ?She has a history of getting the gastric sleeve in 2015.  She states that she is eating well, and drinks protein shakes if not eating.  She is still gaining weight and this is the highest weight she has ever been, even before her gastric sleeve surgery.  She has tried [place] in the past however her insurance stopped covering it.  She has chronic shoulder pain that limits her exercise.  She is going to the gym and does have a pool that she can do for exercise in the summer.  ? ?She has been going to a chiropractor and PT for chronic neck pain and shoulder pain.  They are doing. Dry needling, stretches, ultrasound, cupping for chronic neck and shoulder pain which is helping her symptoms. She started gabapentin and it made her very hungry and she gained 9 pounds. She stopped it due to the side effects. She is interested in starting cymbalta.  ? ?UPPER RESPIRATORY TRACT INFECTION ? ?Fever: no ?Cough: yes ?Shortness of breath: no ?Wheezing: yes ?Chest pain: yes, with cough ?Chest tightness: no ?Chest congestion: no ?Nasal congestion: yes ?Runny nose: no ?Post nasal drip: no ?Sneezing: yes ?Sore throat:  tickle ?Swollen glands: no ?Sinus pressure: no ?Headache: no ?Face pain: no ?Toothache: no ?Ear pain: no bilateral ?Ear pressure: no bilateral ?Eyes red/itching:yes ?Eye drainage/crusting:  no  ?Vomiting: no ?Rash: no ?Fatigue: yes ?Sick contacts: no ?Strep contacts: no  ?Context: better ?Recurrent sinusitis: no ?Relief with OTC cold/cough medications:  some   ?Treatments attempted: nasal saline spray, benadryl  ? ? ?She currently lives with: Son ?Menopausal Symptoms: no ? ?Depression and anxiety screen done today and results listed below:  ?[facility] 02/11/2022 02/11/2022 01/09/2022 06/18/2017  ?Decreased Interest 2 2 3 3   ?Down, Depressed, Hopeless 2 2 3 3   ?PHQ - 2 Score 4 4 6 6   ?Altered sleeping 2 2 3 2   ?Tired, decreased energy 2 2 3 3   ?Change in appetite 2 2 3 3   ?Feeling bad or failure about yourself  2 2 3 2   ?Trouble concentrating 2 2 3 1   ?Moving slowly or fidgety/restless 2 2 3  0  ?Suicidal thoughts 1 1 1  0  ?PHQ-9 Score 17 17 25 17   ?Difficult doing work/chores - - Very difficult -  ? ?GAD 7 : Generalized Anxiety Score 02/11/2022 01/09/2022  ?Nervous, Anxious, on Edge 2 3  ?Control/stop worrying 2 3  ?Worry too much - different things 2 3  ?Trouble relaxing 2 3  ?Restless 2 1  ?Easily annoyed or irritable 2 1  ?Afraid - awful might happen 2 3  ?Total GAD 7 Score 14 17  ?Anxiety Difficulty - Very difficult  ? ? ? ? ?The patient  does not have a history of falls. I did not complete a risk assessment for falls. A plan of care for falls was not documented. ? ? ?Past Medical History:  ?Past Medical History:  ?Diagnosis Date  ?? Anxiety   ?? Arthritis   ?? Depression   ?? Gallbladder problem   ?? GERD (gastroesophageal reflux disease)   ?? High blood pressure   ?? Hypertension   ?? Migraine   ?? Obesity   ?? Obstructive sleep apnea   ?? Vitamin D deficiency   ? ? ?Surgical History:  ?Past Surgical History:  ?Procedure Laterality Date  ?? CHOLECYSTECTOMY    ?? SLEEVE GASTROPLASTY    ?? TUBAL LIGATION    ? ? ?Medications:  ?Current Outpatient Medications on File Prior to Visit  ?Medication Sig  ?? acetaminophen (TYLENOL) 325 MG tablet Take 650 mg by mouth every 6 (six) hours as needed for  mild pain.  ?? clonazePAM (KLONOPIN) 0.5 MG tablet Take 0.5 mg by mouth 2 (two) times daily as needed for anxiety.  ?? Multiple Vitamin (MULTIVITAMIN WITH MINERALS) TABS tablet Take 1 tablet by mouth daily.  ?? pantoprazole (PROTONIX) 40 MG tablet Take 1 tablet (40 mg total) by mouth daily.  ?? valACYclovir (VALTREX) 500 MG tablet Take 1 tablet (500 mg total) by mouth 2 (two) times daily.  ?? Vitamin D, Ergocalciferol, (DRISDOL) 1.25 MG (50000 UNIT) CAPS capsule Take 1 capsule (50,000 Units total) by mouth every 7 (seven) days.  ? ?Current Facility-Administered Medications on File Prior to Visit  ?Medication  ?? cyanocobalamin ((VITAMIN B-12)) injection 1,000 mcg  ? ? ?Allergies:  ?Allergies  ?Allergen Reactions  ?? Lisinopril Cough and Other (See Comments)  ?  Other reaction(s): Unknown ?  ?? Nsaids Other (See Comments)  ?? Percocet [Oxycodone-Acetaminophen]   ?  Chills, nausea  ? ? ?Social History:  ?Social History  ? ?Socioeconomic History  ?? Marital status: Single  ?  Spouse name: Not on file  ?? Number of children: Not on file  ?? Years of education: Not on file  ?? Highest education level: Not on file  ?Occupational History  ?? Occupation: Waitress  ?Tobacco Use  ?? Smoking status: Never  ?? Smokeless tobacco: Never  ?Vaping Use  ?? Vaping Use: Never used  ?Substance and Sexual Activity  ?? Alcohol use: Yes  ?  Comment: socially  ?? Drug use: No  ?? Sexual activity: Not on file  ?Other Topics Concern  ?? Not on file  ?Social History Narrative  ?? Not on file  ? ?Social Determinants of Health  ? ?Financial Resource Strain: Not on file  ?Food Insecurity: Not on file  ?Transportation Needs: Not on file  ?Physical Activity: Not on file  ?Stress: Not on file  ?Social Connections: Not on file  ?Intimate Partner Violence: Not on file  ? ?Social History  ? ?Tobacco Use  ?Smoking Status Never  ?Smokeless Tobacco Never  ? ?Social History  ? ?Substance and Sexual Activity  ?Alcohol Use Yes  ? Comment: socially   ? ? ?Family History:  ?Family History  ?Problem Relation Age of Onset  ?? Diabetes Maternal Grandmother   ?? Heart disease Maternal Grandmother   ?? Diabetes Other   ?? Hyperlipidemia Other   ?? Hypertension Other   ? ? ?Past medical history, surgical history, medications, allergies, family history and social history reviewed with patient today and changes made to appropriate areas of the chart.  ? ?Review of Systems  ?Constitutional:  Positive  for malaise/fatigue.  ?HENT:  Positive for congestion and sore throat (scratchy).   ?Eyes: Negative.   ?Respiratory:  Positive for cough.   ?Cardiovascular: Negative.   ?Gastrointestinal: Negative.   ?Genitourinary: Negative.   ?Musculoskeletal:  Positive for joint pain (shoulder/neck pain chronic, better).  ?Skin: Negative.   ?Neurological: Negative.   ?Psychiatric/Behavioral:  Positive for depression. The patient is not nervous/anxious.   ?All other ROS negative except what is listed above and in the HPI.  ? ?   ?Objective:  ?  ?BP 104/78 (BP Location: Left Arm, Patient Position: Sitting, Cuff Size: Large)   Pulse 80   Temp (!) 96.2 ?F (35.7 ?C) (Temporal)   Ht 5\' 5"  (1.651 m)   Wt 280 lb 6.4 oz (127.2 kg)   SpO2 98%   BMI 46.66 kg/m?   ?Wt Readings from Last 3 Encounters:  ?02/11/22 280 lb 6.4 oz (127.2 kg)  ?01/09/22 281 lb 3.2 oz (127.6 kg)  ?12/17/17 258 lb (117 kg)  ?  ?Physical Exam ?Vitals and nursing note reviewed. Exam conducted with a chaperone present.  ?Constitutional:   ?   General: She is not in acute distress. ?   Appearance: Normal appearance.  ?HENT:  ?   Head: Normocephalic and atraumatic.  ?   Right Ear: Tympanic membrane, ear canal and external ear normal.  ?   Left Ear: Tympanic membrane, ear canal and external ear normal.  ?   Nose: Nose normal.  ?   Mouth/Throat:  ?   Mouth: Mucous membranes are moist.  ?   Pharynx: Oropharynx is clear.  ?Eyes:  ?   Conjunctiva/sclera: Conjunctivae normal.  ?Cardiovascular:  ?   Rate and Rhythm: Normal rate and  regular rhythm.  ?   Pulses: Normal pulses.  ?   Heart sounds: Normal heart sounds.  ?Pulmonary:  ?   Effort: Pulmonary effort is normal.  ?   Breath sounds: Normal breath sounds.  ?Chest:  ?Breasts: ?   Right: Normal.

## 2022-02-11 NOTE — Assessment & Plan Note (Signed)
BMI 46.6.  She did have a history of gastric sleeve in 2015.  She has gained weight since then and states this is the heaviest she has ever been.  Discussed diet and exercise.  Goal is to lose 1 to 2 pounds per week.  We will start semaglutide 0.25 mg weekly subcutaneous injection.  Discussed possible side effects.  Follow-up in 4 to 6 weeks. ?

## 2022-02-11 NOTE — Assessment & Plan Note (Signed)
Chronic, ongoing.  PHQ-9 was a 25 today and GAD-7 14.  She denies SI/HI.  We will start Cymbalta 30 mg daily.  Discussed possible side effects.  Follow-up in 4 to 6 weeks. ?

## 2022-02-11 NOTE — Assessment & Plan Note (Signed)
A1c last visit was 5.6 which is in normal range.  Discussed weight loss semaglutide to help with weight loss and sugars.  Follow-up in 4 to 6 weeks ?

## 2022-02-11 NOTE — Patient Instructions (Signed)
It was great to see you! ? ?Start prednisone taper daily start 6 pills today, 5 pills tomorrow, then decrease by 1 every day until gone. Start mucinex twice a day.  ? ?Start cymbalta 1 capsule daily. You can take this at morning or night.  ? ?Start ozempic once a week injection. It may take a few days to get this at your pharmacy because we usually have to talk to your insurance company.  ? ?Let's follow-up in 6-8 weeks, sooner if you have concerns. ? ?If a referral was placed today, you will be contacted for an appointment. Please note that routine referrals can sometimes take up to 3-4 weeks to process. Please call our office if you haven't heard anything after this time frame. ? ?Take care, ? ?Rodman Pickle, NP ? ?

## 2022-02-11 NOTE — Assessment & Plan Note (Signed)
History of gastric sleeve in 2015.  She is interested in starting semaglutide today to see if that will help with weight loss.  If not, she is interested in other surgery options.  Follow-up in 6 to 8 weeks. ?

## 2022-02-11 NOTE — Assessment & Plan Note (Signed)
Blood pressure was elevated last visit, however it is nicely controlled today at 104/78. ?

## 2022-02-13 ENCOUNTER — Ambulatory Visit: Payer: 59 | Admitting: Physical Therapy

## 2022-02-17 ENCOUNTER — Encounter: Payer: Self-pay | Admitting: Nurse Practitioner

## 2022-02-18 ENCOUNTER — Emergency Department (HOSPITAL_COMMUNITY)
Admission: EM | Admit: 2022-02-18 | Discharge: 2022-02-19 | Disposition: A | Discharge status: Home / Self Care | Payer: 59 | Attending: Emergency Medicine | Admitting: Emergency Medicine

## 2022-02-18 ENCOUNTER — Ambulatory Visit: Payer: 59 | Admitting: Physical Therapy

## 2022-02-18 ENCOUNTER — Encounter (HOSPITAL_COMMUNITY): Payer: Self-pay

## 2022-02-18 ENCOUNTER — Emergency Department (HOSPITAL_COMMUNITY): Payer: 59

## 2022-02-18 ENCOUNTER — Other Ambulatory Visit: Payer: Self-pay

## 2022-02-18 DIAGNOSIS — M545 Low back pain, unspecified: Secondary | ICD-10-CM | POA: Insufficient documentation

## 2022-02-18 DIAGNOSIS — M62838 Other muscle spasm: Secondary | ICD-10-CM

## 2022-02-18 DIAGNOSIS — M5412 Radiculopathy, cervical region: Secondary | ICD-10-CM | POA: Diagnosis not present

## 2022-02-18 DIAGNOSIS — R293 Abnormal posture: Secondary | ICD-10-CM

## 2022-02-18 DIAGNOSIS — M6281 Muscle weakness (generalized): Secondary | ICD-10-CM

## 2022-02-18 MED ORDER — NAPROXEN 500 MG PO TABS: 500.0000 mg | ORAL_TABLET | Freq: Two times a day (BID) | ORAL | 0 refills | Status: DC

## 2022-02-18 MED ORDER — HYDROMORPHONE HCL 1 MG/ML IJ SOLN
1.0000 mg | Freq: Once | INTRAMUSCULAR | Status: AC
  Administered 2022-02-18: 1 mg via INTRAMUSCULAR
  Filled 2022-02-18: qty 1

## 2022-02-18 NOTE — Discharge Instructions (Addendum)
Keep your appointment with orthopedics first thing in the morning tomorrow for further evaluation. ? ?A medication has been sent to your pharmacy to help with pain, by the name Naproxen.  This is an NSAID and is a stronger form of ibuprofen.  Since you have previously tolerated ibuprofen, this medication may be suitable for you.  If you develop rash, shortness of breath, or anaphylaxis, stop taking immediately.  If you are unable to take this medication, you may utilize Tylenol per dosing instructions instead. ? ?Continue utilizing RICE therapy. ? ?Return to the ED for new or worsening symptoms as discussed. ?

## 2022-02-18 NOTE — ED Triage Notes (Signed)
Pt was sent by her physical therapist's office, she reports she coughed last night and now her back is hurting. Ambulatory. ?

## 2022-02-18 NOTE — Therapy (Signed)
[place] ?[facility] ?[address] ?[place], [place], [address] ?Phone: [phone]   Fax:  [phone] ? ?Physical Therapy Treatment ? ?Patient Details  ?Name: Kim Watson ?MRN: [id number] ?Date of Birth: 07-15-1971 ?Referring Provider (PT): Norval Morton, Lauren A ? ? ?Encounter Date: 02/18/2022 ? ? PT End of Session - 02/18/22 1419   ? ? Visit Number 6   ? Date for PT Re-Evaluation 04/01/22   ? PT Start Time 1345   ? PT Stop Time 1417   ? PT Time Calculation (min) 32 min   ? Activity Tolerance Patient limited by pain   ? Behavior During Therapy [facility] for tasks assessed/performed   ? ?  ?  ? ?  ? ? ?Past Medical History:  ?Diagnosis Date  ? Anxiety   ? Arthritis   ? Depression   ? Gallbladder problem   ? GERD (gastroesophageal reflux disease)   ? High blood pressure   ? Hypertension   ? Migraine   ? Obesity   ? Obstructive sleep apnea   ? Vitamin D deficiency   ? ? ?Past Surgical History:  ?Procedure Laterality Date  ? CHOLECYSTECTOMY    ? SLEEVE GASTROPLASTY    ? TUBAL LIGATION    ? ? ?There were no vitals filed for this visit. ? ? Subjective Assessment - 02/18/22 1349   ? ? Subjective Yesterday morning  around 6 o'clock pt reports coughing and felt a snap in her low back belt a burning sensation across her lower back.   ? Currently in Pain? Yes   ? Pain Score 10-Worst pain ever   ? Pain Location Back   ? ?  ?  ? ?  ? ? ? ? ? ? ? ? ? ? ? ? ? ? ? ? ? ? ? ? [facility] Adult PT Treatment/Exercise - 02/18/22 0001   ? ?  ? Modalities  ? Modalities Electrical Stimulation;Cryotherapy   ?  ? Cryotherapy  ? Number Minutes Cryotherapy 10 Minutes   ? Cryotherapy Location Hip   ? Type of Cryotherapy Ice pack   ?  ? Electrical Stimulation  ? Electrical Stimulation Location L hip   ? Electrical Stimulation Action IFC   ? Electrical Stimulation Parameters supne to Pt tolerance   ? Electrical Stimulation Goals Pain   ?  ? Manual Therapy  ? Manual Therapy Soft tissue mobilization;Passive ROM   ?  Manual therapy comments PROM with end range stretching   ? Passive ROM L hip in all directions   ? ?  ?  ? ?  ? ? ? ? ? ? ? ? ? ? ? ? PT Short Term Goals - 02/06/22 1607   ? ?  ? PT SHORT TERM GOAL #1  ? Title I with initial HEP.   ? Status Achieved   ? ?  ?  ? ?  ? ? ? ? PT Long Term Goals - 02/10/22 1421   ? ?  ? PT LONG TERM GOAL #2  ? Title Patient will demosntrate cervical ROM of at least 80% full in all planes.   ? Status On-going   ?  ? PT LONG TERM GOAL #3  ? Title Patient will perform all work activities with L shoulder pain of <+4/10.   ? Status On-going   ? ?  ?  ? ?  ? ? ? ? ? ? ? ? Plan - 02/18/22 1410   ? ? Clinical Impression Statement  Pt enters clinic reporting increase L hip pain that she rates 10/10. increase L hip and low back burning reported with L hip PROM. No issues reported in  her back despite reporting a snap in her back form coughing yesterday. Pt stated she went to chiropractor yesterday to get realigned and was informed it may be a pinched nerve. Modalities applied to help with pain. Advised pt to return to MD.   ? Personal Factors and Comorbidities Fitness;Past/Current Experience   ? Examination-Activity Limitations Bathing;Reach Overhead;Carry;Lift;Hygiene/Grooming   ? Examination-Participation Restrictions Meal Prep;Occupation   ? Stability/Clinical Decision Making Evolving/Moderate complexity   ? Rehab Potential Good   ? PT Treatment/Interventions ADLs/Self Care Home Management;Iontophoresis 4mg /ml Dexamethasone;Moist Heat;Traction;Electrical Stimulation;Cryotherapy;Neuromuscular re-education;Manual techniques;Therapeutic exercise;Therapeutic activities;Functional mobility training;Patient/family education;Dry needling;Passive range of motion   ? PT Next Visit Plan asses Tx   ? ?  ?  ? ?  ? ? ?Patient will benefit from skilled therapeutic intervention in order to improve the following deficits and impairments:  Decreased range of motion, Increased fascial restricitons, Increased  muscle spasms, Impaired UE functional use, Pain, Decreased strength, Decreased mobility, Increased edema, Postural dysfunction, Improper body mechanics, Impaired flexibility, Hypomobility ? ?Visit Diagnosis: ?Radiculopathy, cervical region ? ?Abnormal posture ? ?Muscle weakness (generalized) ? ?Other muscle spasm ? ? ? ? ?Problem List ?Patient Active Problem List  ? Diagnosis Date Noted  ? Bursitis of left shoulder 01/10/2022  ? Dry mouth 01/10/2022  ? History of bariatric surgery 01/10/2022  ? Elevated blood pressure reading 01/10/2022  ? Anxiety and depression 01/10/2022  ? Gastroesophageal reflux disease 01/10/2022  ? Prediabetes 07/13/2017  ? At risk for diabetes mellitus 07/02/2017  ? Insulin resistance 07/02/2017  ? Vitamin D deficiency 07/02/2017  ? Other hyperlipidemia 07/02/2017  ? Morbid obesity (HCC) 06/18/2017  ? ? ?06/20/2017, PTA ?02/18/2022, 2:20 PM ? ?[place] ?[facility] ?[address] ?[place], [place], [place] ?Phone: [phone]   Fax:  [phone] ? ?Name: Kim Watson ?MRN: Kim Watson ?Date of Birth: 1971/08/20 ? ? ? ?

## 2022-02-18 NOTE — ED Provider Notes (Signed)
?[place] ?Provider Note ? ? ?CSN: [id number] ?Arrival date & time: 02/18/22  1751 ? ?  ? ?History ? ?Chief Complaint  ?Patient presents with  ? Back Pain  ? ? ?Kim Watson is a 51 y.o. female with chief complaint of back pain.  Hx of anxiety, depression, GERD, and prediabetes.  Was recently diagnosed with bronchitis for which she has been being treated for.  Denies shortness of breath, fever.   Lingering cough.  Yesterday during coughing fit, pt experienced sudden onset sharp back pain.  Pain 10/10 and localized to left lower back/glute with lateral radiation, but none down the leg/thigh.  Denies numbness or tingling of the leg.  Possible weakness of the leg, pt is unsure.  Denies saddle anesthesia or urinary/bowel incontinence.   ? ?The history is provided by the patient and medical records.  ?Back Pain ? ?  ? ?Home Medications ?Prior to Admission medications   ?Medication Sig Start Date End Date Taking? Authorizing Provider  ?naproxen (NAPROSYN) 500 MG tablet Take 1 tablet (500 mg total) by mouth 2 (two) times daily. 02/18/22  Yes Prince Rome, PA-C  ?acetaminophen (TYLENOL) 325 MG tablet Take 650 mg by mouth every 6 (six) hours as needed for mild pain.    [provider]  ?clonazePAM (KLONOPIN) 0.5 MG tablet Take 0.5 mg by mouth 2 (two) times daily as needed for anxiety.    [provider]  ?DULoxetine (CYMBALTA) 30 MG capsule Take 1 capsule (30 mg total) by mouth daily. 02/11/22   Charyl Dancer, NP  ?Insulin Pen Needle 31G X 5 MM MISC 1 each by Does not apply route once a week. 02/11/22   Charyl Dancer, NP  ?Multiple Vitamin (MULTIVITAMIN WITH MINERALS) TABS tablet Take 1 tablet by mouth daily.    [provider]  ?pantoprazole (PROTONIX) 40 MG tablet Take 1 tablet (40 mg total) by mouth daily. 01/09/22   McElwee, Scheryl Darter, NP  ?predniSONE (DELTASONE) 10 MG tablet Take 6 tablets today, then 5 tablets tomorrow, then decrease by 1  tablet every day until gone 02/11/22   McElwee, Scheryl Darter, NP  ?Semaglutide,0.25 or 0.5MG /DOS, 2 MG/1.5ML SOPN Inject 0.5 mg into the skin once a week. 02/11/22   McElwee, Scheryl Darter, NP  ?valACYclovir (VALTREX) 500 MG tablet Take 1 tablet (500 mg total) by mouth 2 (two) times daily. 01/27/22   Charyl Dancer, NP  ?Vitamin D, Ergocalciferol, (DRISDOL) 1.25 MG (50000 UNIT) CAPS capsule Take 1 capsule (50,000 Units total) by mouth every 7 (seven) days. 01/10/22   Charyl Dancer, NP  ?   ? ?Allergies    ?Lisinopril, Nsaids, and Percocet [oxycodone-acetaminophen]   ? ?Review of Systems   ?Review of Systems  ?Musculoskeletal:  Positive for back pain.  ? ?Physical Exam ?Updated Vital Signs ?BP (!) 141/91   Pulse 72   Temp 98.3 ?F (36.8 ?C) (Oral)   Resp 16   Ht 5' 6.5" (1.689 m)   Wt 127 kg   SpO2 97%   BMI 44.52 kg/m?  ?Physical Exam ?Vitals and nursing note reviewed.  ?Constitutional:   ?   General: She is not in acute distress. ?   Appearance: Normal appearance. She is well-developed. She is not ill-appearing or diaphoretic.  ?HENT:  ?   Head: Normocephalic and atraumatic.  ?   Mouth/Throat:  ?   Mouth: Mucous membranes are moist.  ?   Pharynx: Oropharynx is clear.  ?Eyes:  ?  General: No scleral icterus. ?   Conjunctiva/sclera: Conjunctivae normal.  ?Cardiovascular:  ?   Rate and Rhythm: Normal rate and regular rhythm.  ?   Pulses: Normal pulses.  ?   Heart sounds: No murmur heard. ?Pulmonary:  ?   Effort: Pulmonary effort is normal. No respiratory distress.  ?   Breath sounds: Normal breath sounds.  ?Abdominal:  ?   Palpations: Abdomen is soft.  ?   Tenderness: There is no abdominal tenderness.  ?Musculoskeletal:     ?   General: Tenderness present. No swelling or deformity.  ?   Cervical back: Neck supple.  ?   Lumbar back: Tenderness present.  ?     Back: ? ?   Right lower leg: No edema.  ?   Left lower leg: No edema.  ?   Comments: TTP as indicated above ?Lower extremities appear neurovascularly  intact ?Negative for saddle anesthesia ?Negative for bony tenderness, bony deformity, laceration, erythema, edema, crepitus  ?Skin: ?   General: Skin is warm and dry.  ?   Capillary Refill: Capillary refill takes less than 2 seconds.  ?Neurological:  ?   General: No focal deficit present.  ?   Mental Status: She is alert and oriented to person, place, and time.  ?   Sensory: Sensation is intact. No sensory deficit.  ?Psychiatric:     ?   Mood and Affect: Mood normal.  ? ? ?ED Results / Procedures / Treatments   ?Labs ?(all labs ordered are listed, but only abnormal results are displayed) ?Labs Reviewed - No data to display ? ?EKG ?None ? ?Radiology ?DG Lumbar Spine Complete ? ?Result Date: 02/18/2022 ?CLINICAL DATA:  acute back pain.  Cough and felt instant pain EXAM: LUMBAR SPINE - COMPLETE 4+ VIEW COMPARISON:  X-ray lumbar spine 01/23/2020 FINDINGS: Five non-rib-bearing lumbar vertebral bodies. Mild osteophyte formation and at least moderate facet arthropathy of the lumbar spine. Moderate degenerative changes of the visualized thoracic spine. There is no evidence of lumbar spine fracture. Stable grade 1 anterolisthesis of L4 on L5. Intervertebral lumbar disc spaces are maintained. IMPRESSION: Negative for acute traumatic injury. Electronically Signed   By: Iven Finn M.D.   On: 02/18/2022 23:24   ? ?Procedures ?Procedures  ? ? ?Medications Ordered in ED ?Medications  ?HYDROmorphone (DILAUDID) injection 1 mg (1 mg Intramuscular Given 02/18/22 2320)  ? ? ?ED Course/ Medical Decision Making/ A&P ?  ?                        ?Medical Decision Making ?Amount and/or Complexity of Data Reviewed ?External Data Reviewed: notes. ?Labs: ordered. Decision-making details documented in ED Course. ?Radiology: ordered and independent interpretation performed. Decision-making details documented in ED Course. ?ECG/medicine tests:  Decision-making details documented in ED Course. ? ?Risk ?OTC drugs. ?Prescription drug  management. ? ? ?Patient with acute back pain.  I personally ordered and interpreted x-ray imaging of lumbar region.  No acute fracture or pathology observed.  Tenderness localized of left paraspinous lumbar region.  No neurological deficits and normal neuro exam.  Patient can walk but states is painful.  Not suspicious of CVA due to history and mannerism of onset.  No loss of bowel or bladder control.  No concern for cauda equina.  No fever, night sweats, weight loss, h/o cancer, IVDU.  Pain managed in ED.  RICE protocol and pain medicine indicated and discussed with patient.  Pt advised to follow up with orthopedics  if symptoms persist for possibility of missed fracture diagnosis.  ? ?I discussed the patient and their case with my attending, Dr. Roslynn Amble, who agreed with the proposed treatment course.  After consideration of the diagnostic results and the patients response to treatment, I feel that the patent would benefit from outpatient follow up with orthopedics and OTC pain management.  Discussed course of treatment thoroughly with the patient and she demonstrated understanding.  Patient in agreement and has no further questions. ? ?This chart was dictated using voice recognition software.  Despite best efforts to proofread,  errors can occur which can change the documentation meaning. ? ? ? ? ? ? ? ? ?Final Clinical Impression(s) / ED Diagnoses ?Final diagnoses:  ?Acute left-sided low back pain, unspecified whether sciatica present  ? ? ?Rx / DC Orders ?ED Discharge Orders   ? ?      Ordered  ?  naproxen (NAPROSYN) 500 MG tablet  2 times daily       ? 02/18/22 2327  ? ?  ?  ? ?  ? ? ?  ?Prince Rome, PA-C ?[id number] ? ?  ?Lucrezia Starch, MD ?02/22/22 1600 ? ?

## 2022-02-18 NOTE — Telephone Encounter (Signed)
PA done, waiting for determination from insurance. Will f/u with patient once a decision has been made.  ?

## 2022-02-19 ENCOUNTER — Encounter: Payer: Self-pay | Admitting: Physical Therapy

## 2022-02-19 ENCOUNTER — Ambulatory Visit: Payer: 59 | Admitting: Physical Therapy

## 2022-02-19 DIAGNOSIS — M6281 Muscle weakness (generalized): Secondary | ICD-10-CM

## 2022-02-19 DIAGNOSIS — M5412 Radiculopathy, cervical region: Secondary | ICD-10-CM

## 2022-02-19 DIAGNOSIS — R293 Abnormal posture: Secondary | ICD-10-CM

## 2022-02-19 NOTE — ED Notes (Signed)
DC instructions reviewed with pt. Pt verbalized understanding.  Pt DC 

## 2022-02-19 NOTE — Therapy (Signed)
[place] ?[facility] ?[address] ?[place], [place], [address] ?Phone: [phone]   Fax:  [phone] ? ?Physical Therapy Treatment ? ?Patient Details  ?Name: Kim Watson ?MRN: [id number] ?Date of Birth: 1971/10/06 ?Referring Provider (PT): Norval Morton, Lauren A ? ? ?Encounter Date: 02/19/2022 ? ? PT End of Session - 02/19/22 1333   ? ? Visit Number 7   ? Number of Visits 20   ? Date for PT Re-Evaluation 04/01/22   ? PT Start Time 1300   ? PT Stop Time 1341   ? PT Time Calculation (min) 41 min   ? Activity Tolerance Patient limited by pain   ? Behavior During Therapy [facility] for tasks assessed/performed   ? ?  ?  ? ?  ? ? ?Past Medical History:  ?Diagnosis Date  ? Anxiety   ? Arthritis   ? Depression   ? Gallbladder problem   ? GERD (gastroesophageal reflux disease)   ? High blood pressure   ? Hypertension   ? Migraine   ? Obesity   ? Obstructive sleep apnea   ? Vitamin D deficiency   ? ? ?Past Surgical History:  ?Procedure Laterality Date  ? CHOLECYSTECTOMY    ? SLEEVE GASTROPLASTY    ? TUBAL LIGATION    ? ? ?There were no vitals filed for this visit. ? ? Subjective Assessment - 02/19/22 1304   ? ? Subjective A little sore, better than she was yesterday. Continues to have a pinch in her L shoulder, but low back on the L side is worst.   ? Currently in Pain? Yes   ? Pain Score 6    ? Pain Location Back   ? Pain Orientation Left   ? ?  ?  ? ?  ? ? ? ? ? ? ? ? ? ? ? ? ? ? ? ? ? ? ? ? [facility] Adult PT Treatment/Exercise - 02/19/22 0001   ? ?  ? Exercises  ? Exercises Lumbar;Knee/Hip   ?  ? Lumbar Exercises: Supine  ? Bridge Compliant;10 reps;2 seconds   ? Bridge with [employer] Compliant;10 reps   ? Bridge with clamshell Compliant;10 reps   ? Other Supine Lumbar Exercises hoot lying marches 2x10   ? Other Supine Lumbar Exercises ball squeezes 2x10, Clam blue 2x10   ?  ? Cryotherapy  ? Number Minutes Cryotherapy 10 Minutes   ? Cryotherapy Location Lumbar Spine   L  ? Type of  Cryotherapy Ice pack   ?  ? Electrical Stimulation  ? Electrical Stimulation Location Low back L   ? Electrical Stimulation Action IFC   ? Electrical Stimulation Parameters to PT tolerance   ? Electrical Stimulation Goals Pain   ?  ? Manual Therapy  ? Manual therapy comments PROM with end range stretching   ? Passive ROM L hip in all directions   ? ?  ?  ? ?  ? ? ? ? ? ? ? ? ? ? ? ? PT Short Term Goals - 02/06/22 1607   ? ?  ? PT SHORT TERM GOAL #1  ? Title I with initial HEP.   ? Status Achieved   ? ?  ?  ? ?  ? ? ? ? PT Long Term Goals - 02/10/22 1421   ? ?  ? PT LONG TERM GOAL #2  ? Title Patient will demosntrate cervical ROM of at least 80% full in all planes.   ?  Status On-going   ?  ? PT LONG TERM GOAL #3  ? Title Patient will perform all work activities with L shoulder pain of <+4/10.   ? Status On-going   ? ?  ?  ? ?  ? ? ? ? ? ? ? ? Plan - 02/19/22 1335   ? ? Clinical Impression Statement Pt enters clinic after going to Ed yesterday due to low back pain on the L side. Pt received x-rays while at ED impression were  Negative for acute traumatic injury. Pt reports pain today but not as bad as it was yesterday. Pt able to complete series of pelvic and lumbar strengthening with a mild increase in pain with regular supine bridges. Pt did reports a good pain sensation with L glute stretch. Modalities to aid with pain.   ? Personal Factors and Comorbidities Fitness;Past/Current Experience   ? Examination-Activity Limitations Bathing;Reach Overhead;Carry;Lift;Hygiene/Grooming   ? Examination-Participation Restrictions Meal Prep;Occupation   ? Stability/Clinical Decision Making Evolving/Moderate complexity   ? Rehab Potential Good   ? PT Treatment/Interventions ADLs/Self Care Home Management;Iontophoresis 4mg /ml Dexamethasone;Moist Heat;Traction;Electrical Stimulation;Cryotherapy;Neuromuscular re-education;Manual techniques;Therapeutic exercise;Therapeutic activities;Functional mobility training;Patient/family  education;Dry needling;Passive range of motion   ? PT Next Visit Plan asses Tx   ? ?  ?  ? ?  ? ? ?Patient will benefit from skilled therapeutic intervention in order to improve the following deficits and impairments:  Decreased range of motion, Increased fascial restricitons, Increased muscle spasms, Impaired UE functional use, Pain, Decreased strength, Decreased mobility, Increased edema, Postural dysfunction, Improper body mechanics, Impaired flexibility, Hypomobility ? ?Visit Diagnosis: ?Radiculopathy, cervical region ? ?Abnormal posture ? ?Muscle weakness (generalized) ? ? ? ? ?Problem List ?Patient Active Problem List  ? Diagnosis Date Noted  ? Bursitis of left shoulder 01/10/2022  ? Dry mouth 01/10/2022  ? History of bariatric surgery 01/10/2022  ? Elevated blood pressure reading 01/10/2022  ? Anxiety and depression 01/10/2022  ? Gastroesophageal reflux disease 01/10/2022  ? Prediabetes 07/13/2017  ? At risk for diabetes mellitus 07/02/2017  ? Insulin resistance 07/02/2017  ? Vitamin D deficiency 07/02/2017  ? Other hyperlipidemia 07/02/2017  ? Morbid obesity (HCC) 06/18/2017  ? ? ?06/20/2017, PTA ?02/19/2022, 1:39 PM ? ?[place] ?[facility] ?[address] ?[place], [place], [place] ?Phone: [phone]   Fax:  [phone] ? ?Name: Kim Watson ?MRN: Onalee Hua ?Date of Birth: [birth year] ? ? ? ?

## 2022-02-24 ENCOUNTER — Encounter: Payer: Self-pay | Admitting: Nurse Practitioner

## 2022-02-24 ENCOUNTER — Ambulatory Visit (INDEPENDENT_AMBULATORY_CARE_PROVIDER_SITE_OTHER): Payer: 59 | Admitting: Nurse Practitioner

## 2022-02-24 VITALS — BP 130/88 | HR 82 | Temp 95.5°F | Wt 276.8 lb

## 2022-02-24 DIAGNOSIS — M545 Low back pain, unspecified: Secondary | ICD-10-CM | POA: Diagnosis not present

## 2022-02-24 MED ORDER — PREDNISONE 10 MG PO TABS: ORAL_TABLET | ORAL | 0 refills | Status: DC

## 2022-02-24 MED ORDER — WEGOVY 0.25 MG/0.5ML ~~LOC~~ SOAJ: 0.2500 mg | SUBCUTANEOUS | 1 refills | Status: DC

## 2022-02-24 NOTE — Assessment & Plan Note (Signed)
Acute, ongoing.  She injured her left lower back during a coughing episode 1 week ago.  She went to the ER and had x-rays of lumbar spine that were negative.  She went to physical therapy, chiropractor who said that it was muscular and gave her some stretches to do.  She has been doing the stretches daily.  She is also been taking naproxen 500 mg twice daily.  With ongoing pain we will start prednisone taper.  Continue stretches, naproxen, ice.  Discussed that this pain can last up to 4 to 6 weeks.  If she is still having ongoing pain at next visit in 1 month, will refer to orthopedics.  Follow-up sooner with any concerns. ?

## 2022-02-24 NOTE — Patient Instructions (Signed)
It was great to see you! ? ?Start prednisone taper, 6 tablets today, 5 tablets tomorrow then decrease by 1 every day until gone.  Take this in the morning with food.  Continue naproxen twice a day with food.  Continue your stretches every day, ice, Voltaren, TENS machine. ? ?Let's follow-up at your next scheduled appointment in 4 weeks, sooner if you have concerns. ? ?If a referral was placed today, you will be contacted for an appointment. Please note that routine referrals can sometimes take up to 3-4 weeks to process. Please call our office if you haven't heard anything after this time frame. ? ?Take care, ? ?Vance Peper, NP ? ?

## 2022-02-24 NOTE — Assessment & Plan Note (Signed)
Insurance denied semaglutide for obesity with prediabetes.  We will send in Wegovy 0.25 mg weekly injection.  Keep neck scheduled follow-up appointment in May. ?

## 2022-02-24 NOTE — Progress Notes (Signed)
? ?Established Patient Office Visit ? ?Subjective:  ?Patient ID: Kim Watson, female    DOB: [birth year]  Age: 51 y.o. MRN: [id number] ? ?CC:  ?Chief Complaint  ?Patient presents with  ? Back Pain  ?  Pt c/o chronic lower back pain for several days. Pt states it is getting harder for her to stand for long periods of time.   ? ? ?HPI ?Kim Watson presents for ongoing acute back pain. She went to the ER on 02/18/22 for this back pain. She had a x-ray that was negative for fracture. She states that she coughed and felt severe pain in her low back a week ago. She went to chiropractor and was told that it was musculoskeletal. She went to PT and has been doing stretches.  ? ?BACK PAIN ? ?Duration: 7 days ?Mechanism of injury:  coughing ?Location: Left and low back ?Onset: sudden ?Severity: 6/10 ?Quality: sharp, cramping, pinching, burning ?Frequency: constant ?Radiation: L leg above the knee ?Aggravating factors: movement and walking ?Alleviating factors: rest and ice ?Status: better ?Treatments attempted: naproxen, voltaren, brace, ice, tens, muscle relaxer ?Relief with NSAIDs?: mild ?Nighttime pain:  yes ?Paresthesias / decreased sensation:  no ?Bowel / bladder incontinence:  no ?Fevers:  no ?Dysuria / urinary frequency:  no ? ?Past Medical History:  ?Diagnosis Date  ? Anxiety   ? Arthritis   ? Depression   ? Gallbladder problem   ? GERD (gastroesophageal reflux disease)   ? High blood pressure   ? Hypertension   ? Migraine   ? Obesity   ? Obstructive sleep apnea   ? Vitamin D deficiency   ? ? ?Past Surgical History:  ?Procedure Laterality Date  ? CHOLECYSTECTOMY    ? SLEEVE GASTROPLASTY    ? TUBAL LIGATION    ? ? ?Family History  ?Problem Relation Age of Onset  ? Diabetes Maternal Grandmother   ? Heart disease Maternal Grandmother   ? Diabetes Other   ? Hyperlipidemia Other   ? Hypertension Other   ? ? ?Social History  ? ?Socioeconomic History  ? Marital status: Single  ?  Spouse name: Not on file  ? Number of  children: Not on file  ? Years of education: Not on file  ? Highest education level: Not on file  ?Occupational History  ? Occupation: Waitress  ?Tobacco Use  ? Smoking status: Never  ? Smokeless tobacco: Never  ?Vaping Use  ? Vaping Use: Never used  ?Substance and Sexual Activity  ? Alcohol use: Yes  ?  Comment: socially  ? Drug use: No  ? Sexual activity: Not on file  ?Other Topics Concern  ? Not on file  ?Social History Narrative  ? Not on file  ? ?Social Determinants of Health  ? ?Financial Resource Strain: Not on file  ?Food Insecurity: Not on file  ?Transportation Needs: Not on file  ?Physical Activity: Not on file  ?Stress: Not on file  ?Social Connections: Not on file  ?Intimate Partner Violence: Not on file  ? ? ?Outpatient Medications Prior to Visit  ?Medication Sig Dispense Refill  ? acetaminophen (TYLENOL) 325 MG tablet Take 650 mg by mouth every 6 (six) hours as needed for mild pain.    ? clonazePAM (KLONOPIN) 0.5 MG tablet Take 0.5 mg by mouth 2 (two) times daily as needed for anxiety.    ? DULoxetine (CYMBALTA) 30 MG capsule Take 1 capsule (30 mg total) by mouth daily. 30 capsule 2  ? Insulin Pen Needle  31G X 5 MM MISC 1 each by Does not apply route once a week. 30 each 0  ? Multiple Vitamin (MULTIVITAMIN WITH MINERALS) TABS tablet Take 1 tablet by mouth daily.    ? naproxen (NAPROSYN) 500 MG tablet Take 1 tablet (500 mg total) by mouth 2 (two) times daily. 30 tablet 0  ? pantoprazole (PROTONIX) 40 MG tablet Take 1 tablet (40 mg total) by mouth daily. 90 tablet 1  ? valACYclovir (VALTREX) 500 MG tablet Take 1 tablet (500 mg total) by mouth 2 (two) times daily. 30 tablet 3  ? Vitamin D, Ergocalciferol, (DRISDOL) 1.25 MG (50000 UNIT) CAPS capsule Take 1 capsule (50,000 Units total) by mouth every 7 (seven) days. 12 capsule 0  ? predniSONE (DELTASONE) 10 MG tablet Take 6 tablets today, then 5 tablets tomorrow, then decrease by 1 tablet every day until gone 21 tablet 0  ? Semaglutide,0.25 or 0.5MG /DOS, 2  MG/1.5ML SOPN Inject 0.5 mg into the skin once a week. 3 mL 0  ? ?No facility-administered medications prior to visit.  ? ? ?Allergies  ?Allergen Reactions  ? Lisinopril Cough and Other (See Comments)  ?  Other reaction(s): Unknown ?  ? Nsaids Other (See Comments)  ? Percocet [Oxycodone-Acetaminophen]   ?  Chills, nausea  ? ? ?ROS ?Review of Systems ?See pertinent positives and negatives per HPI. ?  ?Objective:  ?  ?Physical Exam ?Vitals and nursing note reviewed.  ?Constitutional:   ?   General: She is not in acute distress. ?   Appearance: Normal appearance.  ?HENT:  ?   Head: Normocephalic and atraumatic.  ?Eyes:  ?   Conjunctiva/sclera: Conjunctivae normal.  ?Cardiovascular:  ?   Rate and Rhythm: Normal rate and regular rhythm.  ?   Pulses: Normal pulses.  ?   Heart sounds: Normal heart sounds.  ?Pulmonary:  ?   Effort: Pulmonary effort is normal.  ?   Breath sounds: Normal breath sounds.  ?Musculoskeletal:     ?   General: Swelling and tenderness (mild soreness to left SI region) present.  ?   Cervical back: Normal range of motion.  ?   Comments: Straight leg raise negative bilaterally. Decreased lumbar ROM due to pain  ?Skin: ?   General: Skin is warm and dry.  ?Neurological:  ?   General: No focal deficit present.  ?   Mental Status: She is alert and oriented to person, place, and time.  ?Psychiatric:     ?   Mood and Affect: Mood normal.     ?   Behavior: Behavior normal.     ?   Thought Content: Thought content normal.     ?   Judgment: Judgment normal.  ? ? ?BP 130/88 (BP Location: Right Arm, Cuff Size: Large)   Pulse 82   Temp (!) 95.5 ?F (35.3 ?C) (Temporal)   Wt 276 lb 12.8 oz (125.6 kg)   SpO2 98%   BMI 44.01 kg/m?  ?Wt Readings from Last 3 Encounters:  ?02/24/22 276 lb 12.8 oz (125.6 kg)  ?02/18/22 280 lb (127 kg)  ?02/11/22 280 lb 6.4 oz (127.2 kg)  ? ? ? ?Health Maintenance Due  ?Topic Date Due  ? PAP SMEAR-Modifier  Never done  ? ? ?There are no preventive care reminders to display for this  patient. ? ?Lab Results  ?Component Value Date  ? TSH 2.01 01/09/2022  ? ?Lab Results  ?Component Value Date  ? WBC 6.1 01/09/2022  ? HGB 13.0 01/09/2022  ?  HCT 39.9 01/09/2022  ? MCV 85.6 01/09/2022  ? PLT 217.0 01/09/2022  ? ?Lab Results  ?Component Value Date  ? NA 141 01/09/2022  ? K 3.8 01/09/2022  ? CO2 26 01/09/2022  ? GLUCOSE 107 (H) 01/09/2022  ? BUN 17 01/09/2022  ? CREATININE 0.87 01/09/2022  ? BILITOT 0.3 01/09/2022  ? ALKPHOS 63 01/09/2022  ? AST 25 01/09/2022  ? ALT 30 01/09/2022  ? PROT 6.9 01/09/2022  ? ALBUMIN 3.9 01/09/2022  ? CALCIUM 9.1 01/09/2022  ? ANIONGAP 7 06/05/2020  ? GFR 77.70 01/09/2022  ? ?Lab Results  ?Component Value Date  ? CHOL 204 (H) 01/09/2022  ? ?Lab Results  ?Component Value Date  ? HDL 48.10 01/09/2022  ? ?Lab Results  ?Component Value Date  ? LDLCALC 128 (H) 06/18/2017  ? ?Lab Results  ?Component Value Date  ? TRIG 299.0 (H) 01/09/2022  ? ?Lab Results  ?Component Value Date  ? CHOLHDL 4 01/09/2022  ? ?Lab Results  ?Component Value Date  ? HGBA1C 5.6 01/09/2022  ? ? ?  ?Assessment & Plan:  ? ?Problem List Items Addressed This Visit   ? ?  ? Other  ? Morbid obesity ([place])  ?  Insurance denied semaglutide for obesity with prediabetes.  We will send in Wegovy 0.25 mg weekly injection.  Keep neck scheduled follow-up appointment in May. ?  ?  ? Relevant Medications  ? Semaglutide-Weight Management (WEGOVY) 0.25 MG/0.5ML SOAJ  ? Acute left-sided low back pain without sciatica - Primary  ?  Acute, ongoing.  She injured her left lower back during a coughing episode 1 week ago.  She went to the ER and had x-rays of lumbar spine that were negative.  She went to physical therapy, chiropractor who said that it was muscular and gave her some stretches to do.  She has been doing the stretches daily.  She is also been taking naproxen 500 mg twice daily.  With ongoing pain we will start prednisone taper.  Continue stretches, naproxen, ice.  Discussed that this pain can last up to 4 to 6 weeks.   If she is still having ongoing pain at next visit in 1 month, will refer to orthopedics.  Follow-up sooner with any concerns. ?  ?  ? Relevant Medications  ? predniSONE (DELTASONE) 10 MG tablet  ? ? ?Meds order

## 2022-02-25 ENCOUNTER — Telehealth: Payer: Self-pay

## 2022-02-25 NOTE — Telephone Encounter (Signed)
Called and LDVM. PA appeal for Ozempic 2mg /71mL denied 02/21/2022.  Alternative med sent in to preferred pharmacy.  ?

## 2022-02-28 ENCOUNTER — Encounter: Payer: Self-pay | Admitting: Nurse Practitioner

## 2022-02-28 DIAGNOSIS — M545 Low back pain, unspecified: Secondary | ICD-10-CM

## 2022-03-03 ENCOUNTER — Telehealth: Payer: Self-pay

## 2022-03-03 NOTE — Telephone Encounter (Signed)
Called and aspoke to rep Melva from [employer]. Rep stated they do not cover any drugs/prescriptions for weightloss and patient will have to pay OOP. Manufacturer coupon's are available to help with cost. Sw, cma ?

## 2022-03-04 ENCOUNTER — Encounter: Payer: Self-pay | Admitting: Nurse Practitioner

## 2022-03-04 ENCOUNTER — Encounter: Payer: Self-pay | Admitting: Physical Therapy

## 2022-03-04 ENCOUNTER — Ambulatory Visit: Payer: 59 | Attending: Nurse Practitioner | Admitting: Physical Therapy

## 2022-03-04 DIAGNOSIS — M7552 Bursitis of left shoulder: Secondary | ICD-10-CM | POA: Diagnosis not present

## 2022-03-04 DIAGNOSIS — R293 Abnormal posture: Secondary | ICD-10-CM | POA: Insufficient documentation

## 2022-03-04 DIAGNOSIS — M5412 Radiculopathy, cervical region: Secondary | ICD-10-CM | POA: Insufficient documentation

## 2022-03-04 DIAGNOSIS — M6281 Muscle weakness (generalized): Secondary | ICD-10-CM | POA: Diagnosis present

## 2022-03-04 DIAGNOSIS — M62838 Other muscle spasm: Secondary | ICD-10-CM | POA: Diagnosis present

## 2022-03-04 LAB — CYTOLOGY - PAP
Chlamydia: NEGATIVE
Comment: NEGATIVE
Comment: NEGATIVE
Comment: NEGATIVE
Comment: NEGATIVE
Comment: NORMAL
Diagnosis: NEGATIVE
HSV1: NEGATIVE
HSV2: NEGATIVE
High risk HPV: NEGATIVE
Neisseria Gonorrhea: NEGATIVE
Trichomonas: NEGATIVE

## 2022-03-04 NOTE — Therapy (Signed)
?[facility] [address] ?[place], [place], [address] ?Phone: [phone]   Fax:  [phone] ? ?Physical Therapy Treatment ? ?Patient Details  ?Name: Kim Watson ?MRN: [id number] ?Date of Birth: 05-Oct-1971 ?Referring Provider (PT): Dwan Bolt, Lauren A ? ? ?Encounter Date: 03/04/2022 ? ? PT End of Session - 03/04/22 1144   ? ? Visit Number 8   ? Date for PT Re-Evaluation 04/01/22   ? PT Start Time 1100   ? PT Stop Time 1150   ? PT Time Calculation (min) 50 min   ? Activity Tolerance Patient tolerated treatment well   ? Behavior During Therapy [facility] for tasks assessed/performed   ? ?  ?  ? ?  ? ? ?Past Medical History:  ?Diagnosis Date  ? Anxiety   ? Arthritis   ? Depression   ? Gallbladder problem   ? GERD (gastroesophageal reflux disease)   ? High blood pressure   ? Hypertension   ? Migraine   ? Obesity   ? Obstructive sleep apnea   ? Vitamin D deficiency   ? ? ?Past Surgical History:  ?Procedure Laterality Date  ? CHOLECYSTECTOMY    ? SLEEVE GASTROPLASTY    ? TUBAL LIGATION    ? ? ?There were no vitals filed for this visit. ? ? Subjective Assessment - 03/04/22 1102   ? ? Subjective Goes to orthopedic Thursday for her L hip. L shoulder continue to have a pinching sensation. L arm does feel heavy at times and will go numb,   ? Currently in Pain? Yes   ? Pain Score 7    ? Pain Location Hip   ? Pain Orientation Left   ? ?  ?  ? ?  ? ? ? ? ? ? ? ? ? ? ? ? ? ? ? ? ? ? ? ? [facility] Adult PT Treatment/Exercise - 03/04/22 0001   ? ?  ? Exercises  ? Exercises Shoulder   ?  ? Shoulder Exercises: Seated  ? Row Strengthening;Both;Theraband;20 reps   ? Theraband Level (Shoulder Row) Level 2 (Red)   ? External Rotation Strengthening;Both;20 reps;Theraband   ? Theraband Level (Shoulder External Rotation) Level 2 (Red)   ?  ? Shoulder Exercises: Standing  ? Flexion Strengthening;Left;20 reps;Theraband   ? Theraband Level (Shoulder Flexion) Level 1 (Yellow)   ? Extension Strengthening;20  reps;Theraband;Both   ?  ? Shoulder Exercises: ROM/Strengthening  ? Other ROM/Strengthening Exercises AAROM flex, Ext, IR up back 2lb WaTE bar x 10 each   ?  ? Cryotherapy  ? Number Minutes Cryotherapy 10 Minutes   ? Cryotherapy Location Cervical   ? Type of Cryotherapy Ice pack   ?  ? Electrical Stimulation  ? Electrical Stimulation Location Upper traps   ? Electrical Stimulation Action IFC   ? Electrical Stimulation Parameters to pt tolerance   ? Electrical Stimulation Goals Pain   ?  ? Manual Therapy  ? Manual Therapy Soft tissue mobilization;Scapular mobilization;Passive ROM;Taping   ? Soft tissue mobilization left trap, sub scap and rhom   ? Myofascial Release left trap and rhom   ? Scapular Mobilization with stretching   ? ?  ?  ? ?  ? ? ? ? ? ? ? ? ? ? ? ? PT Short Term Goals - 02/06/22 1607   ? ?  ? PT SHORT TERM GOAL #1  ? Title I with initial HEP.   ? Status Achieved   ? ?  ?  ? ?  ? ? ? ?  PT Long Term Goals - 02/10/22 1421   ? ?  ? PT LONG TERM GOAL #2  ? Title Patient will demosntrate cervical ROM of at least 80% full in all planes.   ? Status On-going   ?  ? PT LONG TERM GOAL #3  ? Title Patient will perform all work activities with L shoulder pain of <+4/10.   ? Status On-going   ? ?  ?  ? ?  ? ? ? ? ? ? ? ? Plan - 03/04/22 1145   ? ? Clinical Impression Statement Pt enters reporting increase L hip pain and with L shoulder pinching. She stated that's the will be going to the Orthopedic Thursday. Pt was willing to progress to some scapular stabilization interventions. [place] cues to the elbows needed for arm positioning with external rotation. Postural cue needed with shoulder extensions. Tightens noted in the upper L trap into rhomboid area. Positive response to MT and modalities.   ? Personal Factors and Comorbidities Fitness;Past/Current Experience   ? Examination-Activity Limitations Bathing;Reach Overhead;Carry;Lift;Hygiene/Grooming   ? Examination-Participation Restrictions Meal Prep;Occupation    ? Stability/Clinical Decision Making Evolving/Moderate complexity   ? Rehab Potential Good   ? PT Treatment/Interventions ADLs/Self Care Home Management;Iontophoresis 4mg /ml Dexamethasone;Moist Heat;Traction;Electrical Stimulation;Cryotherapy;Neuromuscular re-education;Manual techniques;Therapeutic exercise;Therapeutic activities;Functional mobility training;Patient/family education;Dry needling;Passive range of motion   ? PT Next Visit Plan asses Tx, get report from MD   ? ?  ?  ? ?  ? ? ?Patient will benefit from skilled therapeutic intervention in order to improve the following deficits and impairments:  Decreased range of motion, Increased fascial restricitons, Increased muscle spasms, Impaired UE functional use, Pain, Decreased strength, Decreased mobility, Increased edema, Postural dysfunction, Improper body mechanics, Impaired flexibility, Hypomobility ? ?Visit Diagnosis: ?Radiculopathy, cervical region ? ?Muscle weakness (generalized) ? ?Other muscle spasm ? ?Abnormal posture ? ? ? ? ?Problem List ?Patient Active Problem List  ? Diagnosis Date Noted  ? Acute left-sided low back pain without sciatica 02/24/2022  ? Bursitis of left shoulder 01/10/2022  ? Dry mouth 01/10/2022  ? History of bariatric surgery 01/10/2022  ? Elevated blood pressure reading 01/10/2022  ? Anxiety and depression 01/10/2022  ? Gastroesophageal reflux disease 01/10/2022  ? Prediabetes 07/13/2017  ? At risk for diabetes mellitus 07/02/2017  ? Insulin resistance 07/02/2017  ? Vitamin D deficiency 07/02/2017  ? Other hyperlipidemia 07/02/2017  ? Morbid obesity ([place]) 06/18/2017  ? ? ?Scot Jun, PTA ?03/04/2022, 11:51 AM ? ?[facility] ?[address] ?[place], [place], [address] ?Phone: [phone]   Fax:  [phone] ? ?Name: Kim Watson ?MRN: [id number] ?Date of Birth: Sep 11, 1971 ? ? ? ?

## 2022-03-06 ENCOUNTER — Ambulatory Visit: Payer: 59

## 2022-03-06 ENCOUNTER — Ambulatory Visit: Payer: 59 | Admitting: Surgery

## 2022-03-06 ENCOUNTER — Ambulatory Visit: Payer: 59 | Admitting: Physical Therapy

## 2022-03-06 DIAGNOSIS — M4316 Spondylolisthesis, lumbar region: Secondary | ICD-10-CM

## 2022-03-06 DIAGNOSIS — M545 Low back pain, unspecified: Secondary | ICD-10-CM

## 2022-03-06 DIAGNOSIS — M5416 Radiculopathy, lumbar region: Secondary | ICD-10-CM

## 2022-03-06 NOTE — Progress Notes (Signed)
? ?Office Visit Note ?  ?Patient: Kim Watson           ?Date of Birth: March 25, 1971           ?MRN: [id number] ?Visit Date: 03/06/2022 ?             ?Requested by: Charyl Dancer, NP ?[place],  [place] [address] ?PCP: Charyl Dancer, NP ? ? ?Assessment & Plan: ?Visit Diagnoses:  ?1. Acute left-sided low back pain without sciatica   ?2. Spondylolisthesis, lumbar region   ?3. Radiculopathy, lumbar region   ? ? ?Plan: With patient's ongoing pain and grade 1 L4-5 spondylolisthesis that does move with flexion extension I recommend getting a lumbar MRI.  We will have patient follow-up with Dr. Louanne Skye in 3 weeks for recheck to discuss results and further treatment options. ? ?Follow-Up Instructions: Return in about 3 weeks (around 03/27/2022) for WITH DR NITKA TO REVIEW LUMBAR MRI.  ? ?Orders:  ?Orders Placed This Encounter  ?Procedures  ? XR Lumb Spine Flex&Ext Only  ? MR Lumbar Spine w/o contrast  ? ?No orders of the defined types were placed in this encounter. ? ? ? ? Procedures: ?No procedures performed ? ? ?Clinical Data: ?No additional findings. ? ? ?Subjective: ?No chief complaint on file. ? ? ?HPI ?51 year old white female comes into the office today with complaints of low back pain and left lower extremity radiculopathy.  Ongoing symptoms for a few weeks.  She did present to the emergency department February 18, 2022 for this.  She complains of pain radiating from her back down to her left hip and thigh.  No right leg symptoms.  Feels like her left leg is heavy.  She had also been seen by her primary care provider February 11, 2022 and prednisone taper was prescribed.  Minimal improvement with that.  She has had bariatric surgery and not able to take oral NSAIDs.  X-rays in the emergency department showed: ? ?Narrative & Impression  ?CLINICAL DATA:  acute back pain.  Cough and felt instant pain ?  ?EXAM: ?LUMBAR SPINE - COMPLETE 4+ VIEW ?  ?COMPARISON:  X-ray lumbar spine 01/23/2020 ?   ?FINDINGS: ?Five non-rib-bearing lumbar vertebral bodies. ?  ?Mild osteophyte formation and at least moderate facet arthropathy of ?the lumbar spine. ?  ?Moderate degenerative changes of the visualized thoracic spine. ?There is no evidence of lumbar spine fracture. Stable grade 1 ?anterolisthesis of L4 on L5. Intervertebral lumbar disc spaces are ?maintained. ?  ?IMPRESSION: ?Negative for acute traumatic injury. ?  ?  ?Electronically Signed ?  By: Iven Finn M.D.  ? ?Review of Systems  ?Constitutional:  Positive for activity change.  ?HENT: Negative.    ?Respiratory: Negative.    ?No current cardiopulmonary GI/GU issues ? ?Objective: ?Vital Signs: There were no vitals taken for this visit. ? ?Physical Exam ?HENT:  ?   Head: Normocephalic and atraumatic.  ?   Nose: Nose normal.  ?Eyes:  ?   Extraocular Movements: Extraocular movements intact.  ?Pulmonary:  ?   Effort: Respiratory distress present.  ?Musculoskeletal:  ?   Comments: Gait is normal.  No lumbar paraspinal tenderness.  Positive left-sided notch tenderness.  Mild left hip greater trochanter bursa tenderness.  Negative on the right side.  Negative logroll.  No focal motor deficits.  ?Neurological:  ?   Mental Status: She is alert.  ?Psychiatric:     ?   Mood and Affect: Mood normal.  ? ? ?  Ortho Exam ? ?Specialty Comments:  ?No specialty comments available. ? ?Imaging: ?No results found. ? ? ?PMFS History: ?Patient Active Problem List  ? Diagnosis Date Noted  ? Acute left-sided low back pain without sciatica 02/24/2022  ? Bursitis of left shoulder 01/10/2022  ? Dry mouth 01/10/2022  ? History of bariatric surgery 01/10/2022  ? Elevated blood pressure reading 01/10/2022  ? Anxiety and depression 01/10/2022  ? Gastroesophageal reflux disease 01/10/2022  ? Prediabetes 07/13/2017  ? At risk for diabetes mellitus 07/02/2017  ? Insulin resistance 07/02/2017  ? Vitamin D deficiency 07/02/2017  ? Other hyperlipidemia 07/02/2017  ? Morbid obesity ([place])  06/18/2017  ? ?Past Medical History:  ?Diagnosis Date  ? Anxiety   ? Arthritis   ? Depression   ? Gallbladder problem   ? GERD (gastroesophageal reflux disease)   ? High blood pressure   ? Hypertension   ? Migraine   ? Obesity   ? Obstructive sleep apnea   ? Vitamin D deficiency   ?  ?Family History  ?Problem Relation Age of Onset  ? Diabetes Maternal Grandmother   ? Heart disease Maternal Grandmother   ? Diabetes Other   ? Hyperlipidemia Other   ? Hypertension Other   ?  ?Past Surgical History:  ?Procedure Laterality Date  ? CHOLECYSTECTOMY    ? SLEEVE GASTROPLASTY    ? TUBAL LIGATION    ? ?Social History  ? ?Occupational History  ? Occupation: Waitress  ?Tobacco Use  ? Smoking status: Never  ? Smokeless tobacco: Never  ?Vaping Use  ? Vaping Use: Never used  ?Substance and Sexual Activity  ? Alcohol use: Yes  ?  Comment: socially  ? Drug use: No  ? Sexual activity: Not on file  ? ? ? ? ? ? ?

## 2022-03-10 ENCOUNTER — Encounter: Payer: Self-pay | Admitting: Nurse Practitioner

## 2022-03-11 ENCOUNTER — Encounter: Payer: Self-pay | Admitting: Physical Therapy

## 2022-03-11 ENCOUNTER — Ambulatory Visit: Payer: 59 | Admitting: Physical Therapy

## 2022-03-11 DIAGNOSIS — M6281 Muscle weakness (generalized): Secondary | ICD-10-CM

## 2022-03-11 DIAGNOSIS — R293 Abnormal posture: Secondary | ICD-10-CM

## 2022-03-11 DIAGNOSIS — M62838 Other muscle spasm: Secondary | ICD-10-CM

## 2022-03-11 DIAGNOSIS — M5412 Radiculopathy, cervical region: Secondary | ICD-10-CM | POA: Diagnosis not present

## 2022-03-11 NOTE — Therapy (Signed)
[place] [facility] ?[facility] ?[address] ?[place], [place], [address] ?Phone: [phone]   Fax:  [phone] ? ?Physical Therapy Treatment ? ?Patient Details  ?Name: Kim Watson ?MRN: [id number] ?Date of Birth: Feb 01, 1971 ?Referring Provider (PT): Norval Morton, Lauren A ? ? ?Encounter Date: 03/11/2022 ? ? PT End of Session - 03/11/22 1210   ? ? Visit Number 9   ? Date for PT Re-Evaluation 04/01/22   ? PT Start Time 1145   ? PT Stop Time 1210   ? PT Time Calculation (min) 25 min   ? Activity Tolerance Patient tolerated treatment well   ? Behavior During Therapy [facility] for tasks assessed/performed   ? ?  ?  ? ?  ? ? ?Past Medical History:  ?Diagnosis Date  ? Anxiety   ? Arthritis   ? Depression   ? Gallbladder problem   ? GERD (gastroesophageal reflux disease)   ? High blood pressure   ? Hypertension   ? Migraine   ? Obesity   ? Obstructive sleep apnea   ? Vitamin D deficiency   ? ? ?Past Surgical History:  ?Procedure Laterality Date  ? CHOLECYSTECTOMY    ? SLEEVE GASTROPLASTY    ? TUBAL LIGATION    ? ? ?There were no vitals filed for this visit. ? ? Subjective Assessment - 03/11/22 1146   ? ? Subjective Pinching sensation since late Saturday in L shoulder.   ? Currently in Pain? Yes   ? Pain Score 4    ? Pain Location Shoulder   ? Pain Orientation Left   ? Pain Descriptors / Indicators Sore   ? ?  ?  ? ?  ? ? ? ? ? ? ? ? ? ? ? ? ? ? ? ? ? ? ? ? [facility] Adult PT Treatment/Exercise - 03/11/22 0001   ? ?  ? Lumbar Exercises: Aerobic  ? UBE (Upper Arm Bike) L1 x 3 min   ?  ? Electrical Stimulation  ? Electrical Stimulation Parameters with [place]   ?  ? Ultrasound  ? Ultrasound Location left cerv, trap, rhom   ? Ultrasound Parameters Combo 1.2w/cm2   ? Ultrasound Goals Other (Comment);Pain   tightness  ? ?  ?  ? ?  ? ? ? ? ? ? ? ? ? ? ? ? PT Short Term Goals - 02/06/22 1607   ? ?  ? PT SHORT TERM GOAL #1  ? Title I with initial HEP.   ? Status Achieved   ? ?  ?  ? ?  ? ? ? ? PT Long Term Goals -  02/10/22 1421   ? ?  ? PT LONG TERM GOAL #2  ? Title Patient will demosntrate cervical ROM of at least 80% full in all planes.   ? Status On-going   ?  ? PT LONG TERM GOAL #3  ? Title Patient will perform all work activities with L shoulder pain of <+4/10.   ? Status On-going   ? ?  ?  ? ?  ? ? ? ? ? ? ? ? Plan - 03/11/22 1210   ? ? Clinical Impression Statement Pt enters requesting [place] estim combo because she has received the most relief from is. Pt agreed to an initial warm up before combo. Combo performed on L shoulder area with positive response.   ? Personal Factors and Comorbidities Fitness;Past/Current Experience   ? Examination-Participation Restrictions Meal Prep;Occupation   ? Stability/Clinical Decision  Making Evolving/Moderate complexity   ? Rehab Potential Good   ? PT Treatment/Interventions ADLs/Self Care Home Management;Iontophoresis 4mg /ml Dexamethasone;Moist Heat;Traction;Electrical Stimulation;Cryotherapy;Neuromuscular re-education;Manual techniques;Therapeutic exercise;Therapeutic activities;Functional mobility training;Patient/family education;Dry needling;Passive range of motion   ? PT Next Visit Plan asses Tx, progress as tolerated   ? ?  ?  ? ?  ? ? ?Patient will benefit from skilled therapeutic intervention in order to improve the following deficits and impairments:  Decreased range of motion, Increased fascial restricitons, Increased muscle spasms, Impaired UE functional use, Pain, Decreased strength, Decreased mobility, Increased edema, Postural dysfunction, Improper body mechanics, Impaired flexibility, Hypomobility ? ?Visit Diagnosis: ?Radiculopathy, cervical region ? ?Other muscle spasm ? ?Muscle weakness (generalized) ? ?Abnormal posture ? ? ? ? ?Problem List ?Patient Active Problem List  ? Diagnosis Date Noted  ? Acute left-sided low back pain without sciatica 02/24/2022  ? Bursitis of left shoulder 01/10/2022  ? Dry mouth 01/10/2022  ? History of bariatric surgery 01/10/2022  ?  Elevated blood pressure reading 01/10/2022  ? Anxiety and depression 01/10/2022  ? Gastroesophageal reflux disease 01/10/2022  ? Prediabetes 07/13/2017  ? At risk for diabetes mellitus 07/02/2017  ? Insulin resistance 07/02/2017  ? Vitamin D deficiency 07/02/2017  ? Other hyperlipidemia 07/02/2017  ? Morbid obesity ([facility]) 06/18/2017  ? ? ?06/20/2017, PTA ?03/11/2022, 12:13 PM ? ?[facility] ?[facility] ?[address] ?[place], [place], [place] ?Phone: [phone]   Fax:  [phone] ? ?Name: Kim Watson ?MRN: [id number] ?Date of Birth: 07/05/1971 ? ? ? ?

## 2022-03-11 NOTE — Progress Notes (Signed)
PA denied for Semaglutide. Patient will be notified during ov appt 03/12/22 ? ?

## 2022-03-11 NOTE — Progress Notes (Signed)
? ?Established Patient Office Visit ? ?Subjective:  ?Patient ID: Kim Watson, female    DOB: [birth year]  Age: 51 y.o. MRN: [id number] ? ?CC:  ?Chief Complaint  ?Patient presents with  ? Follow-up  ?  3 wk f/u, go over options for pain releif until MRI appt.   ? ? ?HPI ?Kim Watson presents for follow-up on back pain.  ? ?She is still having ongoing left lower back pain with radiation down her left leg.  She went to orthopedics who ordered an MRI and told her to continue the naproxen.  She states that the naproxen is not helping much.  She did take tizanidine which helped her sleep, but did not help with the pain at all.  She is massaging tea tree oil into her lower back when somebody is able to and states that this helps her pain some.  She did get relief when taking the prednisone taper.  She has also tried lidocaine patches, Biofreeze, and Tylenol.  She is going on a cruise on Friday and is asking if there is anything else that she can take to help with the pain.  She denies fevers, dysuria, saddle anesthesia. ? ?Past Medical History:  ?Diagnosis Date  ? Anxiety   ? Arthritis   ? Depression   ? Gallbladder problem   ? GERD (gastroesophageal reflux disease)   ? High blood pressure   ? Hypertension   ? Migraine   ? Obesity   ? Obstructive sleep apnea   ? Vitamin D deficiency   ? ? ?Past Surgical History:  ?Procedure Laterality Date  ? CHOLECYSTECTOMY    ? SLEEVE GASTROPLASTY    ? TUBAL LIGATION    ? ? ?Family History  ?Problem Relation Age of Onset  ? Diabetes Maternal Grandmother   ? Heart disease Maternal Grandmother   ? Diabetes Other   ? Hyperlipidemia Other   ? Hypertension Other   ? ? ?Social History  ? ?Socioeconomic History  ? Marital status: Single  ?  Spouse name: Not on file  ? Number of children: Not on file  ? Years of education: Not on file  ? Highest education level: Not on file  ?Occupational History  ? Occupation: Waitress  ?Tobacco Use  ? Smoking status: Never  ? Smokeless tobacco: Never   ?Vaping Use  ? Vaping Use: Never used  ?Substance and Sexual Activity  ? Alcohol use: Yes  ?  Comment: socially  ? Drug use: No  ? Sexual activity: Not on file  ?Other Topics Concern  ? Not on file  ?Social History Narrative  ? Not on file  ? ?Social Determinants of Health  ? ?Financial Resource Strain: Not on file  ?Food Insecurity: Not on file  ?Transportation Needs: Not on file  ?Physical Activity: Not on file  ?Stress: Not on file  ?Social Connections: Not on file  ?Intimate Partner Violence: Not on file  ? ? ?Outpatient Medications Prior to Visit  ?Medication Sig Dispense Refill  ? acetaminophen (TYLENOL) 325 MG tablet Take 650 mg by mouth every 6 (six) hours as needed for mild pain.    ? clonazePAM (KLONOPIN) 0.5 MG tablet Take 0.5 mg by mouth 2 (two) times daily as needed for anxiety.    ? DULoxetine (CYMBALTA) 30 MG capsule Take 1 capsule (30 mg total) by mouth daily. 30 capsule 2  ? Insulin Pen Needle 31G X 5 MM MISC 1 each by Does not apply route once a week. 30  each 0  ? Multiple Vitamin (MULTIVITAMIN WITH MINERALS) TABS tablet Take 1 tablet by mouth daily.    ? naproxen (NAPROSYN) 500 MG tablet Take 1 tablet (500 mg total) by mouth 2 (two) times daily. 30 tablet 0  ? pantoprazole (PROTONIX) 40 MG tablet Take 1 tablet (40 mg total) by mouth daily. 90 tablet 1  ? Semaglutide-Weight Management (WEGOVY) 0.25 MG/0.5ML SOAJ Inject 0.25 mg into the skin once a week. 2 mL 1  ? valACYclovir (VALTREX) 500 MG tablet Take 1 tablet (500 mg total) by mouth 2 (two) times daily. 30 tablet 3  ? Vitamin D, Ergocalciferol, (DRISDOL) 1.25 MG (50000 UNIT) CAPS capsule Take 1 capsule (50,000 Units total) by mouth every 7 (seven) days. 12 capsule 0  ? predniSONE (DELTASONE) 10 MG tablet Take 6 tablets today, then 5 tablets tomorrow, then decrease by 1 tablet every day until gone 21 tablet 0  ? ?No facility-administered medications prior to visit.  ? ? ?Allergies  ?Allergen Reactions  ? Lisinopril Cough and Other (See  Comments)  ?  Other reaction(s): Unknown ?  ? Nsaids Other (See Comments)  ? Percocet [Oxycodone-Acetaminophen]   ?  Chills, nausea  ? ? ?ROS ?Review of Systems ?See pertinent positives and negatives per HPI. ? ?  ?Objective:  ?  ?Physical Exam ?Vitals and nursing note reviewed.  ?Constitutional:   ?   General: She is not in acute distress. ?   Appearance: Normal appearance.  ?HENT:  ?   Head: Normocephalic and atraumatic.  ?Eyes:  ?   Conjunctiva/sclera: Conjunctivae normal.  ?Neck:  ?   Vascular: No carotid bruit.  ?Cardiovascular:  ?   Rate and Rhythm: Normal rate.  ?Pulmonary:  ?   Effort: Pulmonary effort is normal.  ?Musculoskeletal:     ?   General: No tenderness.  ?   Cervical back: Normal range of motion.  ?   Right lower leg: No edema.  ?   Left lower leg: No edema.  ?   Comments: Negative straight leg raise bilaterally  ?Skin: ?   General: Skin is warm and dry.  ?Neurological:  ?   General: No focal deficit present.  ?   Mental Status: She is alert and oriented to person, place, and time.  ?Psychiatric:     ?   Mood and Affect: Mood normal.     ?   Behavior: Behavior normal.     ?   Thought Content: Thought content normal.     ?   Judgment: Judgment normal.  ? ? ?BP (!) 148/102 (BP Location: Left Arm, Cuff Size: Normal)   Pulse 93   Temp 97.7 ?F (36.5 ?C) (Temporal)   Wt 281 lb (127.5 kg)   SpO2 98%   BMI 44.68 kg/m?  ?Wt Readings from Last 3 Encounters:  ?03/12/22 281 lb (127.5 kg)  ?02/24/22 276 lb 12.8 oz (125.6 kg)  ?02/18/22 280 lb (127 kg)  ? ? ? ?There are no preventive care reminders to display for this patient. ? ?There are no preventive care reminders to display for this patient. ? ?Lab Results  ?Component Value Date  ? TSH 2.01 01/09/2022  ? ?Lab Results  ?Component Value Date  ? WBC 6.1 01/09/2022  ? HGB 13.0 01/09/2022  ? HCT 39.9 01/09/2022  ? MCV 85.6 01/09/2022  ? PLT 217.0 01/09/2022  ? ?Lab Results  ?Component Value Date  ? NA 141 01/09/2022  ? K 3.8 01/09/2022  ? CO2 26 01/09/2022   ?  GLUCOSE 107 (H) 01/09/2022  ? BUN 17 01/09/2022  ? CREATININE 0.87 01/09/2022  ? BILITOT 0.3 01/09/2022  ? ALKPHOS 63 01/09/2022  ? AST 25 01/09/2022  ? ALT 30 01/09/2022  ? PROT 6.9 01/09/2022  ? ALBUMIN 3.9 01/09/2022  ? CALCIUM 9.1 01/09/2022  ? ANIONGAP 7 06/05/2020  ? GFR 77.70 01/09/2022  ? ?Lab Results  ?Component Value Date  ? CHOL 204 (H) 01/09/2022  ? ?Lab Results  ?Component Value Date  ? HDL 48.10 01/09/2022  ? ?Lab Results  ?Component Value Date  ? LDLCALC 128 (H) 06/18/2017  ? ?Lab Results  ?Component Value Date  ? TRIG 299.0 (H) 01/09/2022  ? ?Lab Results  ?Component Value Date  ? CHOLHDL 4 01/09/2022  ? ?Lab Results  ?Component Value Date  ? HGBA1C 5.6 01/09/2022  ? ? ?  ?Assessment & Plan:  ? ?Problem List Items Addressed This Visit   ? ?  ? Other  ? Elevated blood pressure reading  ?  Blood pressure is elevated today 148/102.  She states that is due to the pain that she is currently having from her back.  Encouraged her to monitor her blood pressure at home if able. ?  ?  ? Acute left-sided low back pain without sciatica - Primary  ?  Chronic, ongoing.  She is awaiting MRI that has been ordered by orthopedics.  She is going to physical therapy.  She states that her last prednisone taper did help some with the pain.  She tried gabapentin in the past, however it did increase her appetite and so she stopped the medication.  She is also taking naproxen, lidocaine patches, Tylenol, and Biofreeze.  Since the prednisone taper did help her, will start a second, longer prednisone taper to help with acute pain.  We will also start Lyrica 75 mg daily.  PDMP reviewed.  Handicap tag filled out for her since she is having trouble walking and this will allow her wheelchair access on the cruise.  Continue collaboration and recommendations from orthopedics and encouraged her to get the MRI when scheduled. ?  ?  ? Relevant Medications  ? predniSONE (DELTASONE) 10 MG tablet  ? ? ?Meds ordered this encounter   ?Medications  ? predniSONE (DELTASONE) 10 MG tablet  ?  Sig: Start 6 tablets daily today and tomorrow, 5 tablets the next 2 days, then decrease by 1 tablet every other day until gone.  ?  Dispense:  42 tablet  ?  Refill:  0

## 2022-03-12 ENCOUNTER — Encounter: Payer: Self-pay | Admitting: Nurse Practitioner

## 2022-03-12 ENCOUNTER — Ambulatory Visit (INDEPENDENT_AMBULATORY_CARE_PROVIDER_SITE_OTHER): Payer: 59 | Admitting: Nurse Practitioner

## 2022-03-12 VITALS — BP 148/102 | HR 93 | Temp 97.7°F | Wt 281.0 lb

## 2022-03-12 DIAGNOSIS — M545 Low back pain, unspecified: Secondary | ICD-10-CM | POA: Diagnosis not present

## 2022-03-12 DIAGNOSIS — R030 Elevated blood-pressure reading, without diagnosis of hypertension: Secondary | ICD-10-CM | POA: Diagnosis not present

## 2022-03-12 MED ORDER — PREDNISONE 10 MG PO TABS: ORAL_TABLET | ORAL | 0 refills | Status: DC

## 2022-03-12 MED ORDER — PREGABALIN 75 MG PO CAPS: 75.0000 mg | ORAL_CAPSULE | Freq: Every day | ORAL | 0 refills | Status: DC

## 2022-03-12 NOTE — Patient Instructions (Signed)
It was great to see you! ? ?Start prednisone taper 6 tablets today and tomorrow, 5 tablets the next 2 days, then decrease by 1 tablet every other day until gone. ? ?Start lyrica 1 capsule at bedtime. ? ?Continue ongoing stretches.  ? ?Semaglutide (Ozempic) start 0.25mg  weekly for 4 weeks, then increase to 0.5mg  weekly.  ? ?Let's follow-up in 4 months, sooner if you have concerns. ? ?If a referral was placed today, you will be contacted for an appointment. Please note that routine referrals can sometimes take up to 3-4 weeks to process. Please call our office if you haven't heard anything after this time frame. ? ?Take care, ? ?Vance Peper, NP ? ?

## 2022-03-12 NOTE — Assessment & Plan Note (Signed)
Chronic, ongoing.  She is awaiting MRI that has been ordered by orthopedics.  She is going to physical therapy.  She states that her last prednisone taper did help some with the pain.  She tried gabapentin in the past, however it did increase her appetite and so she stopped the medication.  She is also taking naproxen, lidocaine patches, Tylenol, and Biofreeze.  Since the prednisone taper did help her, will start a second, longer prednisone taper to help with acute pain.  We will also start Lyrica 75 mg daily.  PDMP reviewed.  Handicap tag filled out for her since she is having trouble walking and this will allow her wheelchair access on the cruise.  Continue collaboration and recommendations from orthopedics and encouraged her to get the MRI when scheduled. ?

## 2022-03-12 NOTE — Assessment & Plan Note (Signed)
Blood pressure is elevated today 148/102.  She states that is due to the pain that she is currently having from her back.  Encouraged her to monitor her blood pressure at home if able. ?

## 2022-03-13 ENCOUNTER — Ambulatory Visit: Payer: 59 | Admitting: Physical Therapy

## 2022-03-13 ENCOUNTER — Encounter: Payer: Self-pay | Admitting: Physical Therapy

## 2022-03-13 NOTE — Therapy (Signed)
[place] [facility] ?[address] ?[place], [place], [address] ?Phone: [phone]   Fax:  [phone] ? ?Physical Therapy Treatment ? ?Patient Details  ?Name: Kim Watson ?MRN: [id number] ?Date of Birth: 05/19/1971 ?Referring Provider (PT): Norval Morton, Lauren A ? ? ?Encounter Date: 03/13/2022 ? ? PT End of Session - 03/13/22 1158   ? ? Visit Number 9   ? Date for PT Re-Evaluation 04/01/22   ? PT Start Time 1138   ? PT Stop Time 1149   ? PT Time Calculation (min) 11 min   ? Activity Tolerance --   ? Behavior During Therapy --   ? ?  ?  ? ?  ? ? ?Past Medical History:  ?Diagnosis Date  ? Anxiety   ? Arthritis   ? Depression   ? Gallbladder problem   ? GERD (gastroesophageal reflux disease)   ? High blood pressure   ? Hypertension   ? Migraine   ? Obesity   ? Obstructive sleep apnea   ? Vitamin D deficiency   ? ? ?Past Surgical History:  ?Procedure Laterality Date  ? CHOLECYSTECTOMY    ? SLEEVE GASTROPLASTY    ? TUBAL LIGATION    ? ? ?There were no vitals filed for this visit. ? ? Subjective Assessment - 03/13/22 1138   ? ? Subjective Patient reports overall improvement. She liked the US/esim combo. She got a set of therapy bands she plans to use for exercising. She is requesting estim/US for her low back, which she re injured recently. However, the order is for L shoulder Bursitis and patien tis pending MRI for her low back, so unable to provide treatment for her back. She declined treatment for her shoulder.   ? Currently in Pain? Yes   ? Pain Score 4    ? Pain Location Shoulder   ? Pain Orientation Left   ? Pain Descriptors / Indicators Sore   ? Pain Type Chronic pain   ? Pain Onset More than a month ago   ? Pain Frequency Constant   ? ?  ?  ? ?  ? ? ? ? ? ? ? ? ? ? ? ? ? ? ? ? ? ? ? ? ? ? ? ? ? ? ? ? ? ? ? PT Short Term Goals - 02/06/22 1607   ? ?  ? PT SHORT TERM GOAL #1  ? Title I with initial HEP.   ? Status Achieved   ? ?  ?  ? ?  ? ? ? ? PT Long Term Goals - 03/13/22  1141   ? ?  ? PT LONG TERM GOAL #1  ? Title I with final HEP   ? Time 6   ? Period Weeks   ? Status On-going   ? Target Date 04/01/22   ?  ? PT LONG TERM GOAL #2  ? Title Patient will demosntrate cervical ROM of at least 80% full in all planes.   ? Status On-going   ?  ? PT LONG TERM GOAL #3  ? Title Patient will perform all work activities with L shoulder pain of <+4/10.   ? Status On-going   ? ?  ?  ? ?  ? ? ? ? ? ? ? ? Plan - 03/13/22 1201   ? ? Clinical Impression Statement Patient arrived requesitng treatment for her low back due to re-injury. However, she does not have order to treat low back and  is awaiting an MRI, so unable to treat her. She declined treatment for her L shoulder today, Going on a cruise and will re-schedule after she returns. She will also seek an order for LBP.   ? Consulted and Agree with Plan of Care Patient   ? ?  ?  ? ?  ? ? ?Patient will benefit from skilled therapeutic intervention in order to improve the following deficits and impairments:    ? ?Visit Diagnosis: ?Bursitis of left shoulder ? ? ? ? ?Problem List ?Patient Active Problem List  ? Diagnosis Date Noted  ? Acute left-sided low back pain without sciatica 02/24/2022  ? Bursitis of left shoulder 01/10/2022  ? Dry mouth 01/10/2022  ? History of bariatric surgery 01/10/2022  ? Elevated blood pressure reading 01/10/2022  ? Anxiety and depression 01/10/2022  ? Gastroesophageal reflux disease 01/10/2022  ? Prediabetes 07/13/2017  ? At risk for diabetes mellitus 07/02/2017  ? Insulin resistance 07/02/2017  ? Vitamin D deficiency 07/02/2017  ? Other hyperlipidemia 07/02/2017  ? Morbid obesity (HCC) 06/18/2017  ? ? ?Iona Beard, DPT ?03/13/2022, 12:03 PM ? ?[place] [facility] ?[address] ?[place], [place], [address] ?Phone: [phone]   Fax:  [phone] ? ?Name: Kim Watson ?MRN: [id number] ?Date of Birth: 17-Apr-1971 ? ? ? ?

## 2022-03-24 ENCOUNTER — Ambulatory Visit: Payer: 59 | Admitting: Physical Therapy

## 2022-04-01 ENCOUNTER — Ambulatory Visit: Payer: 59 | Admitting: Nurse Practitioner

## 2022-04-01 ENCOUNTER — Telehealth: Payer: Self-pay | Admitting: Nurse Practitioner

## 2022-04-01 NOTE — Telephone Encounter (Signed)
Pt was a no show for her ov with Lauren on 04/01/22, I sent a no show letter.  ?

## 2022-04-07 ENCOUNTER — Ambulatory Visit (HOSPITAL_COMMUNITY)
Admission: RE | Admit: 2022-04-07 | Discharge: 2022-04-07 | Disposition: A | Discharge status: Home / Self Care | Payer: 59 | Source: Ambulatory Visit | Attending: Surgery | Admitting: Surgery

## 2022-04-07 DIAGNOSIS — M5416 Radiculopathy, lumbar region: Secondary | ICD-10-CM | POA: Insufficient documentation

## 2022-04-07 DIAGNOSIS — M4316 Spondylolisthesis, lumbar region: Secondary | ICD-10-CM | POA: Diagnosis present

## 2022-04-08 ENCOUNTER — Other Ambulatory Visit: Payer: Self-pay | Admitting: Nurse Practitioner

## 2022-04-10 NOTE — Telephone Encounter (Signed)
1st no show, prior late cancel (car broke down), no fee, letter sent ?

## 2022-04-14 ENCOUNTER — Telehealth: Payer: Self-pay | Admitting: Specialist

## 2022-04-14 NOTE — Telephone Encounter (Signed)
Pt would like to come in before a month out to see Dr Elvera Maria does not want to see Fayrene Fearing  ? ?This is MRI review  ?

## 2022-04-15 ENCOUNTER — Telehealth: Payer: Self-pay | Admitting: Specialist

## 2022-04-15 NOTE — Telephone Encounter (Signed)
I called and advised that Dr. Nitka does not call with MRI Reviews as pts don't get a clear understanding of what's going on till they see the pictures, so he just wants to see them in office and review. I did get her scheduled for 04/18/22 @ 8:45  ?

## 2022-04-15 NOTE — Telephone Encounter (Signed)
Spoke with patient she asked if she can be called with the results of her MRI.  The number to contact patient is [phone] ?

## 2022-04-15 NOTE — Telephone Encounter (Signed)
I called and advised that Dr. Otelia Sergeant does not call with MRI Reviews as pts don't get a clear understanding of what's going on till they see the pictures, so he just wants to see them in office and review. I did get her scheduled for 04/18/22 @ 8:45  ?

## 2022-04-18 ENCOUNTER — Encounter: Payer: Self-pay | Admitting: Specialist

## 2022-04-18 ENCOUNTER — Other Ambulatory Visit: Payer: Self-pay

## 2022-04-18 ENCOUNTER — Ambulatory Visit: Payer: 59 | Admitting: Specialist

## 2022-04-18 ENCOUNTER — Ambulatory Visit (INDEPENDENT_AMBULATORY_CARE_PROVIDER_SITE_OTHER): Payer: 59

## 2022-04-18 VITALS — BP 147/97 | HR 87 | Ht 66.0 in | Wt 281.0 lb

## 2022-04-18 DIAGNOSIS — M4316 Spondylolisthesis, lumbar region: Secondary | ICD-10-CM | POA: Diagnosis not present

## 2022-04-18 DIAGNOSIS — M25552 Pain in left hip: Secondary | ICD-10-CM

## 2022-04-18 DIAGNOSIS — M545 Low back pain, unspecified: Secondary | ICD-10-CM

## 2022-04-18 DIAGNOSIS — M5126 Other intervertebral disc displacement, lumbar region: Secondary | ICD-10-CM | POA: Diagnosis not present

## 2022-04-18 MED ORDER — PREGABALIN 75 MG PO CAPS: 75.0000 mg | ORAL_CAPSULE | Freq: Two times a day (BID) | ORAL | 2 refills | Status: DC

## 2022-04-18 MED ORDER — VITAMIN D (ERGOCALCIFEROL) 1.25 MG (50000 UNIT) PO CAPS
50000.0000 [IU] | ORAL_CAPSULE | ORAL | 0 refills | Status: DC
Start: 2022-04-18 — End: 2022-07-14

## 2022-04-18 MED ORDER — HYDROCODONE-ACETAMINOPHEN 10-325 MG PO TABS: 0.5000 | ORAL_TABLET | Freq: Four times a day (QID) | ORAL | 0 refills | Status: DC | PRN

## 2022-04-18 NOTE — Patient Instructions (Addendum)
Avoid bending, stooping and avoid lifting weights greater than 10 lbs. Avoid prolong standing and walking. Avoid frequent bending and stooping  No lifting greater than 10 lbs. May use ice or moist heat for pain. Weight loss is of benefit. Handicap license is approved. Dr. Edna Blas secretary/Assistant will call to arrange for epidural steroid injection   Pregabalin BID for 5 days then TID.

## 2022-04-18 NOTE — Progress Notes (Signed)
Office Visit Note   Patient: Kim Watson           Date of Birth: 09-24-1971           MRN: [id number] Visit Date: 04/18/2022              Requested by: Gerre Scull, NP [address] [place],  [place] [address] PCP: Gerre Scull, NP   Assessment & Plan: Visit Diagnoses:  1. Acute left-sided low back pain without sciatica   2. Pain of left hip   3. Herniated intervertebral disc of lumbar spine   4. Spondylolisthesis, lumbar region     Plan:Avoid bending, stooping and avoid lifting weights greater than 10 lbs. Avoid prolong standing and walking. Avoid frequent bending and stooping  No lifting greater than 10 lbs. May use ice or moist heat for pain. Weight loss is of benefit. Handicap license is approved. Dr. Weber Blas secretary/Assistant will call to arrange for epidural steroid injection   Pregabalin BID for 5 days then TID.  Follow-Up Instructions: Return in about 3 weeks (around 05/09/2022).   Orders:  Orders Placed This Encounter  Procedures   XR HIP UNILAT W OR W/O PELVIS 2-3 VIEWS LEFT   No orders of the defined types were placed in this encounter.     Procedures: No procedures performed   Clinical Data: No additional findings.   Subjective: Chief Complaint  Patient presents with   Lower Back - Pain    MRI Review   Left Hip - Pain    Patient states that she feels like it is more hip pain that it coming from her back.     51 year old female with history of low back and left leg pain since a coughing when at work for about 15 min on 02/05/2022. She had immediate pain and fell to the floor with pain and it was into the left buttock and has had some pain into the left leg with difficulty reaching shoes to do tying is painful and is associated with twisting with the left leg drawn up on a chair. No bowel or bladder difficulty. Had a prednisone and then a dose pak. The dose pak helped to relieve the pain. She is tending to stoop and to lean  when standing and with grocery shopping . Notices weakness into the left leg with  Stairs leads with the right leg.  Review of Systems  Constitutional: Negative.   HENT:  Positive for congestion, sinus pain and sneezing.   Eyes: Negative.   Respiratory:  Positive for cough.   Cardiovascular: Negative.   Gastrointestinal: Negative.   Endocrine: Negative.   Genitourinary: Negative.   Musculoskeletal:  Positive for back pain and gait problem.  Skin: Negative.   Allergic/Immunologic: Negative.   Neurological:  Positive for weakness and numbness.  Hematological: Negative.  Negative for adenopathy. Does not bruise/bleed easily.  Psychiatric/Behavioral: Negative.      Objective: Vital Signs: BP (!) 147/97 (BP Location: Left Arm, Patient Position: Sitting)   Pulse 87   Ht 5\' 6"  (1.676 m)   Wt 281 lb (127.5 kg)   BMI 45.35 kg/m   Physical Exam  Ortho Exam  Specialty Comments:  No specialty comments available.  Imaging: XR HIP UNILAT W OR W/O PELVIS 2-3 VIEWS LEFT  Result Date: 04/18/2022 AP standing pelvis and lateral radiographs of the left hip show mild narrowing of the right hip more than left with subchondral sclerosis of the left and right  hip superior acetabular region some squaring off of the right femoral head and neck interval, no CAM. Consistent with mild DJD, left leg length is 4 mm shorter than the right.     PMFS History: Patient Active Problem List   Diagnosis Date Noted   Acute left-sided low back pain without sciatica 02/24/2022   Bursitis of left shoulder 01/10/2022   Dry mouth 01/10/2022   History of bariatric surgery 01/10/2022   Elevated blood pressure reading 01/10/2022   Anxiety and depression 01/10/2022   Gastroesophageal reflux disease 01/10/2022   Prediabetes 07/13/2017   At risk for diabetes mellitus 07/02/2017   Insulin resistance 07/02/2017   Vitamin D deficiency 07/02/2017   Other hyperlipidemia 07/02/2017   Morbid obesity (HCC) 06/18/2017    Past Medical History:  Diagnosis Date   Anxiety    Arthritis    Depression    Gallbladder problem    GERD (gastroesophageal reflux disease)    High blood pressure    Hypertension    Migraine    Obesity    Obstructive sleep apnea    Vitamin D deficiency     Family History  Problem Relation Age of Onset   Diabetes Maternal Grandmother    Heart disease Maternal Grandmother    Diabetes Other    Hyperlipidemia Other    Hypertension Other     Past Surgical History:  Procedure Laterality Date   CHOLECYSTECTOMY     SLEEVE GASTROPLASTY     TUBAL LIGATION     Social History   Occupational History   Occupation: Waitress  Tobacco Use   Smoking status: Never   Smokeless tobacco: Never  Vaping Use   Vaping Use: Never used  Substance and Sexual Activity   Alcohol use: Yes    Comment: socially   Drug use: No   Sexual activity: Not on file

## 2022-04-29 ENCOUNTER — Encounter: Payer: Self-pay | Admitting: Specialist

## 2022-04-29 ENCOUNTER — Encounter: Payer: Self-pay | Admitting: Nurse Practitioner

## 2022-04-29 MED ORDER — PANTOPRAZOLE SODIUM 40 MG PO TBEC
40.0000 mg | DELAYED_RELEASE_TABLET | Freq: Every day | ORAL | 1 refills | Status: DC
Start: 2022-04-29 — End: 2023-02-02

## 2022-04-29 MED ORDER — DULOXETINE HCL 30 MG PO CPEP
30.0000 mg | ORAL_CAPSULE | Freq: Every day | ORAL | 2 refills | Status: DC
Start: 2022-04-29 — End: 2022-07-14

## 2022-05-06 NOTE — Progress Notes (Addendum)
Established Patient Office Visit  Subjective   Patient ID: Kim Watson, female    DOB: 1971/07/01  Age: 51 y.o. MRN: [id number]  Chief Complaint  Patient presents with   Callouses    Pt c/o possible callus on side of both feet w/ pain. Pt states she has a hard time standing due to calluses causing discomfort    HPI  Kim Watson is here today for corns or calluses on the side of her left foot.  She states that she has a hard time standing because they cause a lot of pain.  She states that usually she will have her daughter-in-law scrape them off however she has been sick and dealing with her own health issues recently.  She is still having some back pain and is following with orthopedics for this.  She started having some upper back pain around her shoulders at night and is worse with laying down.  She states that it feels like a tightness.  She is taking her naproxen, Tylenol as needed.    ROS See pertinent positives and negatives per HPI.    Objective:     BP (!) 150/100 (BP Location: Right Arm, Cuff Size: Large)   Pulse 81   Temp (!) 97.1 F (36.2 C) (Temporal)   Wt 282 lb 3.2 oz (128 kg)   SpO2 99%   BMI 45.55 kg/m  BP Readings from Last 3 Encounters:  05/07/22 (!) 150/100  04/18/22 (!) 147/97  03/12/22 (!) 148/102   Wt Readings from Last 3 Encounters:  05/07/22 282 lb 3.2 oz (128 kg)  04/18/22 281 lb (127.5 kg)  03/12/22 281 lb (127.5 kg)    Physical Exam Vitals and nursing note reviewed.  Constitutional:      General: She is not in acute distress.    Appearance: Normal appearance.  HENT:     Head: Normocephalic.  Eyes:     Conjunctiva/sclera: Conjunctivae normal.  Cardiovascular:     Rate and Rhythm: Normal rate and regular rhythm.     Pulses: Normal pulses.     Heart sounds: Normal heart sounds.  Pulmonary:     Effort: Pulmonary effort is normal.     Breath sounds: Normal breath sounds.  Musculoskeletal:     Cervical back: Normal range of motion.   Skin:    General: Skin is warm.     Comments: Callous to lateral side of left foot  Neurological:     General: No focal deficit present.     Mental Status: She is alert and oriented to person, place, and time.  Psychiatric:        Mood and Affect: Mood normal.        Behavior: Behavior normal.        Thought Content: Thought content normal.        Judgment: Judgment normal.      Assessment & Plan:   Problem List Items Addressed This Visit       Cardiovascular and Mediastinum   Primary hypertension    Her blood pressure has been elevated for several visits in a row now.  Today her BP is 150/100.  She was taking lisinopril in the past, however it caused a cough so she stopped it.  We will start losartan-HCTZ 50-12.5 mg daily.  Encouraged her to check her blood pressure at home.  Follow-up in 2 to 3 weeks.      Relevant Medications   losartan-hydrochlorothiazide (HYZAAR) 50-12.5 MG tablet  Musculoskeletal and Integument   Granuloma annulare    She has a history of granuloma annulare on bilateral legs.  She states that recently the redness has gotten worse.  Discussed that this can be related to stress.  We will give her some Lotrisone cream to see if this helps.        Other   Morbid obesity (HCC)    BMI 45.5. She has a history of bariatric surgery. Her health insurance does not cover weight loss medication. Will place referral to [facility]. Discussed there is a current wait list to get an appointment.       Relevant Orders   Amb Ref to Medical Weight Management   Neck pain    She is having acute pain to her posterior neck and upper shoulders.  She states that it feels like a tightness and is worse when she lays down.  We will give her some Flexeril to take as needed for this tightness.  Instructed her this may make her sleepy.  Recommend she follow-up with her orthopedic.      Other Visit Diagnoses     Callus of foot    -  Primary   Callus  noted to the lateral aspect of left foot.  Discussed not wearing shoes that are tight and referral placed to podiatry.   Relevant Orders   Ambulatory referral to Podiatry       Return in about 2 weeks (around 05/21/2022), or if symptoms worsen or fail to improve, for 2-3 weeks , HTN.    Gerre Scull, NP

## 2022-05-07 ENCOUNTER — Ambulatory Visit (INDEPENDENT_AMBULATORY_CARE_PROVIDER_SITE_OTHER): Payer: 59 | Admitting: Nurse Practitioner

## 2022-05-07 ENCOUNTER — Ambulatory Visit: Payer: 59 | Admitting: Physical Therapy

## 2022-05-07 ENCOUNTER — Encounter: Payer: Self-pay | Admitting: Nurse Practitioner

## 2022-05-07 VITALS — BP 150/100 | HR 81 | Temp 97.1°F | Wt 282.2 lb

## 2022-05-07 DIAGNOSIS — L84 Corns and callosities: Secondary | ICD-10-CM

## 2022-05-07 DIAGNOSIS — L920 Granuloma annulare: Secondary | ICD-10-CM | POA: Diagnosis not present

## 2022-05-07 DIAGNOSIS — I10 Essential (primary) hypertension: Secondary | ICD-10-CM | POA: Diagnosis not present

## 2022-05-07 DIAGNOSIS — M542 Cervicalgia: Secondary | ICD-10-CM

## 2022-05-07 DIAGNOSIS — G8929 Other chronic pain: Secondary | ICD-10-CM | POA: Insufficient documentation

## 2022-05-07 MED ORDER — CYCLOBENZAPRINE HCL 10 MG PO TABS: 10.0000 mg | ORAL_TABLET | Freq: Three times a day (TID) | ORAL | 0 refills | Status: DC | PRN

## 2022-05-07 MED ORDER — CLOTRIMAZOLE-BETAMETHASONE 1-0.05 % EX CREA: 1.0000 "application " | TOPICAL_CREAM | Freq: Every day | CUTANEOUS | 1 refills | Status: DC

## 2022-05-07 MED ORDER — LOSARTAN POTASSIUM-HCTZ 50-12.5 MG PO TABS: 1.0000 | ORAL_TABLET | Freq: Every day | ORAL | 1 refills | Status: DC

## 2022-05-07 NOTE — Assessment & Plan Note (Signed)
She has a history of granuloma annulare on bilateral legs.  She states that recently the redness has gotten worse.  Discussed that this can be related to stress.  We will give her some Lotrisone cream to see if this helps.

## 2022-05-07 NOTE — Assessment & Plan Note (Signed)
She is having acute pain to her posterior neck and upper shoulders.  She states that it feels like a tightness and is worse when she lays down.  We will give her some Flexeril to take as needed for this tightness.  Instructed her this may make her sleepy.  Recommend she follow-up with her orthopedic.

## 2022-05-07 NOTE — Assessment & Plan Note (Signed)
Her blood pressure has been elevated for several visits in a row now.  Today her BP is 150/100.  She was taking lisinopril in the past, however it caused a cough so she stopped it.  We will start losartan-HCTZ 50-12.5 mg daily.  Encouraged her to check her blood pressure at home.  Follow-up in 2 to 3 weeks.

## 2022-05-07 NOTE — Therapy (Incomplete)
OUTPATIENT PHYSICAL THERAPY THORACOLUMBAR EVALUATION   Patient Name: Kim Watson MRN: [id number] DOB:[birth year], 51 y.o., female Today's Date: 05/07/2022    Past Medical History:  Diagnosis Date   Anxiety    Arthritis    Depression    Gallbladder problem    GERD (gastroesophageal reflux disease)    High blood pressure    Hypertension    Migraine    Obesity    Obstructive sleep apnea    Vitamin D deficiency    Past Surgical History:  Procedure Laterality Date   CHOLECYSTECTOMY     SLEEVE GASTROPLASTY     TUBAL LIGATION     Patient Active Problem List   Diagnosis Date Noted   Acute left-sided low back pain without sciatica 02/24/2022   Bursitis of left shoulder 01/10/2022   Dry mouth 01/10/2022   History of bariatric surgery 01/10/2022   Elevated blood pressure reading 01/10/2022   Anxiety and depression 01/10/2022   Gastroesophageal reflux disease 01/10/2022   Prediabetes 07/13/2017   At risk for diabetes mellitus 07/02/2017   Insulin resistance 07/02/2017   Vitamin D deficiency 07/02/2017   Other hyperlipidemia 07/02/2017   Morbid obesity (HCC) 06/18/2017    PCP: Norval Morton  REFERRING PROVIDER: Evangeline Dakin DIAG: spondylolisthesis L4-5 with a disc herniation into the left L4 neuroforamen  Rationale for Evaluation and Treatment Rehabilitation  THERAPY DIAG:  Low back pain with left sciatica, pain in left hip, sponylolisthesis  ONSET DATE: 04/18/22  SUBJECTIVE:                                                                                                                                                                                           SUBJECTIVE STATEMENT: *** PERTINENT HISTORY:  Anxiety, arthritis, depression, GERD, HTN, migraine  PAIN:  Are you having pain? Yes: NPRS scale: ***/10 Pain location: *** Pain description: *** Aggravating factors: *** Relieving factors: ***   PRECAUTIONS: {Therapy precautions:24002}  WEIGHT BEARING  RESTRICTIONS {Yes ***/No:24003}  FALLS:  Has patient fallen in last 6 months? {fallsyesno:27318}  LIVING ENVIRONMENT: Lives with: {OPRC lives with:25569::"lives with their family"} Lives in: {Lives in:25570} Stairs: {opstairs:27293} Has following equipment at home: {Assistive devices:23999}  OCCUPATION: ***  PLOF: {PLOF:24004}  PATIENT GOALS ***   OBJECTIVE:   DIAGNOSTIC FINDINGS:  spondylolisthesis L4-5 with a disc herniation into the left L4 neuroforamen  PATIENT SURVEYS:  {rehab surveys:24030}  SCREENING FOR RED FLAGS: Bowel or bladder incontinence: {Yes/No:304960894} Spinal tumors: {Yes/No:304960894} Cauda equina syndrome: {Yes/No:304960894} Compression fracture: {Yes/No:304960894} Abdominal aneurysm: {Yes/No:304960894}  COGNITION:  Overall cognitive status: Within functional limits for tasks assessed     SENSATION: [facility]  MUSCLE  LENGTH: Hamstrings: Right *** deg; Left *** deg Maisie Fus test: Right *** deg; Left *** deg  POSTURE: {posture:25561}  PALPATION: ***  LUMBAR ROM:   {AROM/PROM:27142}  A/PROM  eval  Flexion   Extension   Right lateral flexion   Left lateral flexion   Right rotation   Left rotation    (Blank rows = not tested)  LOWER EXTREMITY ROM:     {AROM/PROM:27142}  Right eval Left eval  Hip flexion    Hip extension    Hip abduction    Hip adduction    Hip internal rotation    Hip external rotation    Knee flexion    Knee extension    Ankle dorsiflexion    Ankle plantarflexion    Ankle inversion    Ankle eversion     (Blank rows = not tested)  LOWER EXTREMITY MMT:    MMT Right eval Left eval  Hip flexion    Hip extension    Hip abduction    Hip adduction    Hip internal rotation    Hip external rotation    Knee flexion    Knee extension    Ankle dorsiflexion    Ankle plantarflexion    Ankle inversion    Ankle eversion     (Blank rows = not tested)  LUMBAR SPECIAL TESTS:  {lumbar special  test:25242}  FUNCTIONAL TESTS:  {Functional tests:24029}  GAIT: Distance walked: *** Assistive device utilized: {Assistive devices:23999} Level of assistance: {Levels of assistance:24026} Comments: ***    TODAY'S TREATMENT  ***   PATIENT EDUCATION:  Education details: *** Person educated: Patient Education method: {Education Method:25205} Education comprehension: {Education Comprehension:25206}   HOME EXERCISE PROGRAM: ***  ASSESSMENT:  CLINICAL IMPRESSION: Patient is a 51 y.o. female who was seen today for physical therapy evaluation and treatment for ***.    OBJECTIVE IMPAIRMENTS {opptimpairments:25111}.   ACTIVITY LIMITATIONS {activitylimitations:27494}  PARTICIPATION LIMITATIONS: {participationrestrictions:25113}  PERSONAL FACTORS {Personal factors:25162} are also affecting patient's functional outcome.   REHAB POTENTIAL: {rehabpotential:25112}  CLINICAL DECISION MAKING: {clinical decision making:25114}  EVALUATION COMPLEXITY: {Evaluation complexity:25115}   GOALS: Goals reviewed with patient? Yes  SHORT TERM GOALS: Target date: 05/21/22 Patient will be independent with initial HEP.  Goal status: INITIAL   LONG TERM GOALS: Target date: 07/30/22  Patient will be independent with advanced/ongoing HEP to improve outcomes and carryover.  Goal status: INITIAL  2.  Patient will report 75% improvement in low back pain to improve QOL.  Goal status: {GOALSTATUS:25110}  3.  Patient to demonstrate ability to achieve and maintain good spinal alignment/posturing and body mechanics needed for daily activities. Goal status: {GOALSTATUS:25110}  4.  Patient will demonstrate full pain free lumbar ROM to perform ADLs.   Goal status: {GOALSTATUS:25110}  5.  Patient will demonstrate improved functional strength as demonstrated by ***. Goal status: {GOALSTATUS:25110}  6.  Patient will report *** on lumbar FOTO to demonstrate improved functional ability.  Goal  status: {GOALSTATUS:25110}   7.  Patient will tolerate *** min of (standing/sitting/walking) to perform ***. Goal status: {GOALSTATUS:25110}  PLAN: PT FREQUENCY: {rehab frequency:25116}  PT DURATION: 12 weeks  PLANNED INTERVENTIONS: {rehab planned interventions:25118::"Therapeutic exercises","Therapeutic activity","Neuromuscular re-education","Balance training","Gait training","Patient/Family education","Joint mobilization"}.  PLAN FOR NEXT SESSION: Jearld Lesch, PT 05/07/2022, 8:34 AM

## 2022-05-07 NOTE — Patient Instructions (Addendum)
It was great to see you!  I am referring you to podiatry of your foot. They will call you to schedule.   I have sent in some flexeril to your pharmacy to take which is a muscle relaxer which may help your shoulders at night. This may make you sleepy.   Start losartan-hctz 1 tablet daily for your blood pressure.   Start either claritin (loratadine) or zyrtec (cetirizine) 10mg  daily for your itching.   Let's follow-up in 2-3 weeks  Take care,  , NP

## 2022-05-08 NOTE — Addendum Note (Signed)
Addended by: Rodman Pickle A on: 05/08/2022 01:01 PM   Modules accepted: Orders

## 2022-05-08 NOTE — Assessment & Plan Note (Signed)
BMI 45.5. She has a history of bariatric surgery. Her health insurance does not cover weight loss medication. Will place referral to [facility]. Discussed there is a current wait list to get an appointment.

## 2022-05-15 ENCOUNTER — Ambulatory Visit: Payer: 59 | Admitting: Podiatry

## 2022-05-15 DIAGNOSIS — Q828 Other specified congenital malformations of skin: Secondary | ICD-10-CM | POA: Diagnosis not present

## 2022-05-16 ENCOUNTER — Ambulatory Visit: Payer: Self-pay

## 2022-05-16 ENCOUNTER — Ambulatory Visit: Payer: 59 | Admitting: Specialist

## 2022-05-16 ENCOUNTER — Encounter: Payer: Self-pay | Admitting: Specialist

## 2022-05-16 VITALS — BP 140/89 | HR 83 | Ht 66.0 in | Wt 282.0 lb

## 2022-05-16 DIAGNOSIS — M7552 Bursitis of left shoulder: Secondary | ICD-10-CM

## 2022-05-16 DIAGNOSIS — M25511 Pain in right shoulder: Secondary | ICD-10-CM

## 2022-05-16 DIAGNOSIS — M7521 Bicipital tendinitis, right shoulder: Secondary | ICD-10-CM

## 2022-05-16 DIAGNOSIS — M7542 Impingement syndrome of left shoulder: Secondary | ICD-10-CM

## 2022-05-16 DIAGNOSIS — M47812 Spondylosis without myelopathy or radiculopathy, cervical region: Secondary | ICD-10-CM

## 2022-05-16 DIAGNOSIS — M542 Cervicalgia: Secondary | ICD-10-CM

## 2022-05-16 MED ORDER — PREGABALIN 75 MG PO CAPS: 75.0000 mg | ORAL_CAPSULE | Freq: Two times a day (BID) | ORAL | 0 refills | Status: DC

## 2022-05-16 MED ORDER — METHYLPREDNISOLONE ACETATE 40 MG/ML IJ SUSP
40.0000 mg | INTRAMUSCULAR | Status: AC | PRN
  Administered 2022-05-16: 40 mg via INTRA_ARTICULAR

## 2022-05-16 MED ORDER — METHYLPREDNISOLONE 4 MG PO TABS: ORAL_TABLET | ORAL | 0 refills | Status: AC

## 2022-05-16 MED ORDER — BUPIVACAINE HCL 0.5 % IJ SOLN
5.0000 mL | INTRAMUSCULAR | Status: AC | PRN
  Administered 2022-05-16: 5 mL

## 2022-05-16 MED ORDER — BUPIVACAINE HCL 0.5 % IJ SOLN
3.0000 mL | INTRAMUSCULAR | Status: AC | PRN
  Administered 2022-05-16: 3 mL via INTRA_ARTICULAR

## 2022-05-16 NOTE — Patient Instructions (Signed)
Avoid overhead lifting and overhead use of the arms. Do not lift greater than 5-10 lbs. Adjust head rest in vehicle to prevent hyperextension if rear ended. Take extra precautions to avoid falling. Avoid overhead lifting and overhead use of the arms. Pillows to keep from sleeping directly on the shoulders Limited lifting to less than 10 lbs. Ice or heat for relief. NSAIDs are helpful, such as alleve or motrin, be careful not to use in excess as they place burdens on the kidney. You should not take NSAIDs due to your bypass surgery Stretching exercise help and strengthening is helpful to build endurance.

## 2022-05-16 NOTE — Progress Notes (Signed)
Subjective:  Patient ID: Kim Watson, female    DOB: 1971/04/21,  MRN: [id number]  Chief Complaint  Patient presents with   Callouses    51 y.o. female presents with the above complaint.  Patient presents with complaint bilateral submetatarsal 5 porokeratotic lesion.  Patient states the pain for touch is progressive gotten worse.  She states it hurts with ambulation pain scale is 5 out of 10.  He has been on for quite some time.  She has not tried anything for it.  She has not made any shoe gear modification.  She would like to discuss treatment options for it.  She has not seen anyone else prior to seeing me for this.   Review of Systems: Negative except as noted in the HPI. Denies N/V/F/Ch.  Past Medical History:  Diagnosis Date   Anxiety    Arthritis    Depression    Gallbladder problem    GERD (gastroesophageal reflux disease)    High blood pressure    Hypertension    Migraine    Obesity    Obstructive sleep apnea    Vitamin D deficiency     Current Outpatient Medications:    clonazePAM (KLONOPIN) 0.5 MG tablet, Take 0.5 mg by mouth 2 (two) times daily as needed for anxiety., Disp: , Rfl:    clotrimazole-betamethasone (LOTRISONE) cream, Apply 1 application. topically daily., Disp: 30 g, Rfl: 1   cyclobenzaprine (FLEXERIL) 10 MG tablet, Take 1 tablet (10 mg total) by mouth 3 (three) times daily as needed for muscle spasms., Disp: 30 tablet, Rfl: 0   DULoxetine (CYMBALTA) 30 MG capsule, Take 1 capsule (30 mg total) by mouth daily., Disp: 30 capsule, Rfl: 2   HYDROcodone-acetaminophen (NORCO) 10-325 MG tablet, Take 0.5 tablets by mouth every 6 (six) hours as needed., Disp: 15 tablet, Rfl: 0   losartan-hydrochlorothiazide (HYZAAR) 50-12.5 MG tablet, Take 1 tablet by mouth daily., Disp: 30 tablet, Rfl: 1   methylPREDNISolone (MEDROL) 4 MG tablet, Take 1 tablet (4 mg total) by mouth daily for 14 days, THEN 0.5 tablets (2 mg total) daily for 14 days., Disp: 21 tablet, Rfl: 0    Multiple Vitamin (MULTIVITAMIN WITH MINERALS) TABS tablet, Take 1 tablet by mouth daily., Disp: , Rfl:    pantoprazole (PROTONIX) 40 MG tablet, Take 1 tablet (40 mg total) by mouth daily., Disp: 90 tablet, Rfl: 1   pregabalin (LYRICA) 75 MG capsule, Take 1 capsule (75 mg total) by mouth 2 (two) times daily., Disp: 90 capsule, Rfl: 0   valACYclovir (VALTREX) 500 MG tablet, Take 1 tablet (500 mg total) by mouth 2 (two) times daily., Disp: 30 tablet, Rfl: 3   Vitamin D, Ergocalciferol, (DRISDOL) 1.25 MG (50000 UNIT) CAPS capsule, Take 1 capsule (50,000 Units total) by mouth every 7 (seven) days., Disp: 12 capsule, Rfl: 0  Social History   Tobacco Use  Smoking Status Never  Smokeless Tobacco Never    Allergies  Allergen Reactions   Lisinopril Cough and Other (See Comments)    Other reaction(s): Unknown    Nsaids Other (See Comments)   Objective:  There were no vitals filed for this visit. There is no height or weight on file to calculate BMI. Constitutional Well developed. Well nourished.  Vascular Dorsalis pedis pulses palpable bilaterally. Posterior tibial pulses palpable bilaterally. Capillary refill normal to all digits.  No cyanosis or clubbing noted. Pedal hair growth normal.  Neurologic Normal speech. Oriented to person, place, and time. Epicritic sensation to light touch grossly  present bilaterally.  Dermatologic Hyperkeratotic lesion with central nucleated core noted to submetatarsal 5 bilaterally.  Mild pain on palpation.  No pinpoint bleeding noted upon debridement  Orthopedic: Normal joint ROM without pain or crepitus bilaterally. No visible deformities. No bony tenderness.   Radiographs: None Assessment:   1. Porokeratosis    Plan:  Patient was evaluated and treated and all questions answered.  Bilateral submetatarsal 5 porokeratotic lesion -All questions and concerns were discussed with the patient in extensive detail -Using chisel blade to handle the lesion  was debrided down to healthy striated tissue followed by excision of central nucleated core.  Patient had immediate relief.  No pinpoint bleeding noted. -I discussed shoe gear modification and offloading.  No follow-ups on file.

## 2022-05-16 NOTE — Progress Notes (Signed)
Office Visit Note   Patient: Kim Watson           Date of Birth: January 09, 1971           MRN: [id number] Visit Date: 05/16/2022              Requested by: Gerre Scull, NP [address] [place],  [place] [address] PCP: Gerre Scull, NP   Assessment & Plan: Visit Diagnoses:  1. Neck pain   2. Acute pain of right shoulder   3. Tendonitis of long head of biceps brachii of right shoulder   4. Bursitis of left shoulder   5. Impingement syndrome of left shoulder   6. Spondylosis without myelopathy or radiculopathy, cervical region     Plan: Avoid overhead lifting and overhead use of the arms. Do not lift greater than 5-10 lbs. Adjust head rest in vehicle to prevent hyperextension if rear ended. Take extra precautions to avoid falling. Avoid overhead lifting and overhead use of the arms. Pillows to keep from sleeping directly on the shoulders Limited lifting to less than 10 lbs. Ice or heat for relief. NSAIDs are helpful, such as alleve or motrin, be careful not to use in excess as they place burdens on the kidney. You should not take NSAIDs due to your bypass surgery Stretching exercise help and strengthening is helpful to build endura  Follow-Up Instructions: Return in about 3 weeks (around 06/06/2022).   Orders:  Orders Placed This Encounter  Procedures  . XR Cervical Spine 2 or 3 views  . XR Shoulder Right   No orders of the defined types were placed in this encounter.     Procedures: Large Joint Inj: L subacromial bursa on 05/16/2022 1:30 PM Indications: pain Details: 25 G 1.5 in needle, anterolateral approach  Arthrogram: No  Medications: 40 mg methylPREDNISolone acetate 40 MG/ML; 3 mL bupivacaine 0.5 % Outcome: tolerated well, no immediate complications Procedure, treatment alternatives, risks and benefits explained, specific risks discussed. Consent was given by the patient. Immediately prior to procedure a time out was called to verify the  correct patient, procedure, equipment, support staff and site/side marked as required. Patient was prepped and draped in the usual sterile fashion.    Hand/UE Inj on 05/16/2022 1:31 PM Indications: tendon swelling Details: 27 G needle Medications: 5 mL bupivacaine 0.5 %  Right biceps tendon at the shoulder.     Clinical Data: No additional findings.   Subjective: Chief Complaint  Patient presents with  . Neck - Pain  . Right Shoulder - Pain    51 year old female with history of left shoulder bursitis and lumbar spinal stenosis. She is to undergo an ESI in the lumbar spine by Dr. Alvester Morin 6/26. She has been experiencing pain into right shoulder more than the left over the right anterior shoulder and pain with lying on the right shoulder. She also describes stiffness and painful ROM of the cervical spine. No gait disturbance numbness or tingling.   Review of Systems  Constitutional: Negative.   HENT: Negative.    Eyes: Negative.   Respiratory: Negative.    Cardiovascular: Negative.   Gastrointestinal: Negative.   Endocrine: Negative.   Genitourinary: Negative.   Musculoskeletal: Negative.   Skin: Negative.   Allergic/Immunologic: Negative.   Neurological: Negative.   Hematological: Negative.   Psychiatric/Behavioral: Negative.       Objective: Vital Signs: BP 140/89   Pulse 83   Ht 5\' 6"  (1.676 m)   Wt  282 lb (127.9 kg)   BMI 45.52 kg/m   Physical Exam Constitutional:      Appearance: She is well-developed.  HENT:     Head: Normocephalic and atraumatic.  Eyes:     Pupils: Pupils are equal, round, and reactive to light.  Pulmonary:     Effort: Pulmonary effort is normal.     Breath sounds: Normal breath sounds.  Abdominal:     General: Bowel sounds are normal.     Palpations: Abdomen is soft.  Musculoskeletal:     Cervical back: Normal range of motion and neck supple.  Skin:    General: Skin is warm and dry.  Neurological:     Mental Status: She is alert  and oriented to person, place, and time.  Psychiatric:        Behavior: Behavior normal.        Thought Content: Thought content normal.        Judgment: Judgment normal.   Back Exam   Range of Motion  Extension:  50 abnormal  Flexion:  0 abnormal  Lateral bend right:  abnormal  Lateral bend left:  abnormal  Rotation right:  abnormal  Rotation left:  abnormal   Muscle Strength  Right Quadriceps:  5/5  Left Quadriceps:  5/5  Right Hamstrings:  5/5  Left Hamstrings:  5/5   Reflexes  Biceps:  2/4   Right Shoulder Exam   Tenderness  The patient is experiencing tenderness in the biceps tendon.  Muscle Strength  Abduction: 5/5  Internal rotation: 5/5  External rotation: 5/5  Supraspinatus: 5/5  Subscapularis: 5/5  Biceps: 5/5   Tests  Apprehension: positive Impingement: positive  Other  Erythema: absent Scars: absent Sensation: normal Pulse: present    Specialty Comments:  No specialty comments available.  Imaging: No results found.   PMFS History: Patient Active Problem List   Diagnosis Date Noted  . 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/07/2022  . Primary hypertension 05/07/2022  . Neck pain 05/07/2022  . Acute left-sided low back pain without sciatica 02/24/2022  . Bursitis of left shoulder 01/10/2022  . Dry mouth 01/10/2022  . History of bariatric surgery 01/10/2022  . Elevated blood pressure reading 01/10/2022  . Anxiety and depression 01/10/2022  . Gastroesophageal reflux disease 01/10/2022  . Prediabetes 07/13/2017  . At risk for diabetes mellitus 07/02/2017  . Insulin resistance 07/02/2017  . Vitamin D deficiency 07/02/2017  . Other hyperlipidemia 07/02/2017  . Morbid obesity ([place]) 06/18/2017   Past Medical History:  Diagnosis Date  . Anxiety   . Arthritis   . Depression   . Gallbladder problem   . GERD (gastroesophageal reflux disease)   . High blood pressure   . Hypertension   . Migraine   . Obesity   . Obstructive sleep apnea   . Vitamin  D deficiency     Family History  Problem Relation Age of Onset  . Diabetes Maternal Grandmother   . Heart disease Maternal Grandmother   . Diabetes Other   . Hyperlipidemia Other   . Hypertension Other     Past Surgical History:  Procedure Laterality Date  . CHOLECYSTECTOMY    . SLEEVE GASTROPLASTY    . TUBAL LIGATION     Social History   Occupational History  . Occupation: Waitress  Tobacco Use  . Smoking status: Never  . Smokeless tobacco: Never  Vaping Use  . Vaping Use: Never used  Substance and Sexual Activity  . Alcohol use: Yes    Comment:  socially  . Drug use: No  . Sexual activity: Not on file

## 2022-05-20 ENCOUNTER — Ambulatory Visit: Payer: 59 | Attending: Specialist | Admitting: Physical Therapy

## 2022-05-20 ENCOUNTER — Encounter: Payer: Self-pay | Admitting: Physical Therapy

## 2022-05-20 DIAGNOSIS — M545 Low back pain, unspecified: Secondary | ICD-10-CM | POA: Diagnosis not present

## 2022-05-20 DIAGNOSIS — M7552 Bursitis of left shoulder: Secondary | ICD-10-CM

## 2022-05-20 DIAGNOSIS — M25552 Pain in left hip: Secondary | ICD-10-CM

## 2022-05-20 DIAGNOSIS — M5126 Other intervertebral disc displacement, lumbar region: Secondary | ICD-10-CM | POA: Insufficient documentation

## 2022-05-20 DIAGNOSIS — M5459 Other low back pain: Secondary | ICD-10-CM | POA: Diagnosis present

## 2022-05-20 DIAGNOSIS — M4316 Spondylolisthesis, lumbar region: Secondary | ICD-10-CM | POA: Diagnosis not present

## 2022-05-20 DIAGNOSIS — R262 Difficulty in walking, not elsewhere classified: Secondary | ICD-10-CM

## 2022-05-20 DIAGNOSIS — M25511 Pain in right shoulder: Secondary | ICD-10-CM | POA: Diagnosis present

## 2022-05-20 NOTE — Therapy (Signed)
OUTPATIENT PHYSICAL THERAPY THORACOLUMBAR EVALUATION   Patient Name: Kim Watson MRN: [id number] DOB:1970/12/22, 51 y.o., female Today's Date: 05/20/2022   PT End of Session - 05/20/22 1751     Visit Number 1    Number of Visits 20    Date for PT Re-Evaluation 08/20/22    Authorization Type [employer]    PT Start Time 1745    PT Stop Time 1830    PT Time Calculation (min) 45 min    Activity Tolerance Patient tolerated treatment well    Behavior During Therapy [facility] for tasks assessed/performed             Past Medical History:  Diagnosis Date   Anxiety    Arthritis    Depression    Gallbladder problem    GERD (gastroesophageal reflux disease)    High blood pressure    Hypertension    Migraine    Obesity    Obstructive sleep apnea    Vitamin D deficiency    Past Surgical History:  Procedure Laterality Date   CHOLECYSTECTOMY     SLEEVE GASTROPLASTY     TUBAL LIGATION     Patient Active Problem List   Diagnosis Date Noted   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/07/2022   Primary hypertension 05/07/2022   Neck pain 05/07/2022   Acute left-sided low back pain without sciatica 02/24/2022   Bursitis of left shoulder 01/10/2022   Dry mouth 01/10/2022   History of bariatric surgery 01/10/2022   Elevated blood pressure reading 01/10/2022   Anxiety and depression 01/10/2022   Gastroesophageal reflux disease 01/10/2022   Prediabetes 07/13/2017   At risk for diabetes mellitus 07/02/2017   Insulin resistance 07/02/2017   Vitamin D deficiency 07/02/2017   Other hyperlipidemia 07/02/2017   Morbid obesity (HCC) 06/18/2017    PCP: Norval Morton  REFERRING PROVIDER: Otelia Sergeant  REFERRING DIAG: LBP, left hip pain and bilateral shoulder pain  Rationale for Evaluation and Treatment Rehabilitation  THERAPY DIAG:  Other low back pain  Pain in left hip  Difficulty in walking, not elsewhere classified  Bursitis of left shoulder  Acute pain of right shoulder  ONSET DATE:  02/05/22  SUBJECTIVE:                                                                                                                                                                                           SUBJECTIVE STATEMENT: Patient has LBP with anterolisthesis, central canal and foraminal stenosis, right shoulder bursitis, left shoulder biceps tendonitis, left hip pain with DJD, a lot of the pain started on March the 8th when she coughed and her knees buckled.  PERTINENT HISTORY:  Anxiety, arthritis, depression, GERD, HTN,Migraine  PAIN:  Are you having pain? Yes: NPRS scale: 8/10 Pain location: shoulders, back and left hip, some pain in the neck Pain description: sore, ache, sharp at times Aggravating factors: doing hair, dressing, walking, bending, standing pain can be up to 10/10 Relieving factors: injections help,    PRECAUTIONS: None  WEIGHT BEARING RESTRICTIONS No  FALLS:  Has patient fallen in last 6 months? 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 Slight progression of previously noted grade 1 anterolisthesis of L4 on L5, with new right paracentral disc extrusion with 6 mm cranial migration and new mild-to-moderate spinal canal stenosis. Unchanged mild left and mild-to-moderate right neural foraminal narrowing. Effacement of the lateral recesses at this level likely affects the descending L5 nerve roots. 2. Multilevel facet arthropathy, which is moderate at L3-L4 and severe at L4-L5, which can also be a source of back pain  PATIENT SURVEYS:  FOTO 40  SCREENING FOR RED FLAGS: Bowel or bladder incontinence: No Spinal tumors:  No Cauda equina syndrome: No Compression fracture: No Abdominal aneurysm: No  COGNITION:  Overall cognitive status: Within functional limits for tasks assessed     SENSATION: WFL  MUSCLE LENGTH: Hamstrings: Right 60 deg; Left 60 deg  POSTURE: rounded shoulders and forward head  PALPATION: Very tender int he right biceps, left lateral shoulder, sore and tight in the upper traps, the low back and the left buttock  LUMBAR ROM:    Lumbar ROM decreased 50% due to pain   Cervical ROM decreased 50% due to pain   Shoulder ROM flexion 130, ER 60 and IR is 30 degrees with pain LOWER EXTREMITY Strength:  Hips 4-/5 with pain in the left hip  Shoulder strength 3+/5 with significant pain   GAIT:   No device, slow, antalgic on the left, difficulty getting up from sitting   TODAY'S TREATMENT  MHP/estim left buttock   PATIENT EDUCATION:  Education details: HEP Person educated: Patient Education method: Programmer, multimedia, Facilities manager, Verbal cues, and Handouts Education comprehension: verbalized understanding   HOME EXERCISE PROGRAM: Access Code: [email or web address] URL: [email or web address] Date: 05/20/2022 Prepared by: Stacie Glaze  Exercises - Supine Lower Trunk Rotation  - 2 x daily - 7 x weekly - 1 sets - 10 reps - 10 hold - Hooklying Single Knee to Chest Stretch  - 2 x daily - 7 x weekly - 1 sets - 10 reps - 10 hold - Supine Double Knee to Chest Modified  - 2 x daily - 7 x weekly - 1 sets - 10 reps - 10 hold  ASSESSMENT:  CLINICAL IMPRESSION: Patient is a 51 y.o. female who was seen today for physical therapy evaluation and treatment for back pain, left hip pain, bilateral shoulder pain and cervicalgia.  She has had issues with all but on 02/05/22 she coughed and her knees buckled, MRI results are included She has right biceps tendonitis and left shoulder bursitis.  She is in a lot of pain and is limited with her motions due to pain.    OBJECTIVE IMPAIRMENTS Abnormal  gait, cardiopulmonary status limiting activity, decreased activity tolerance, decreased mobility, difficulty walking, decreased  ROM, decreased strength, increased muscle spasms, impaired flexibility, impaired UE functional use, improper body mechanics, postural dysfunction, and pain.   REHAB POTENTIAL: Good  CLINICAL DECISION MAKING: Stable/uncomplicated  EVALUATION COMPLEXITY: Moderate   GOALS: Goals reviewed with patient? Yes  SHORT TERM GOALS: Target date: 06/03/22  Independent with initial HEP Goal status: INITIAL  LONG TERM GOALS: Target date: 08/12/2022  Understand posture and body mechanics Goal status: INITIAL  2.  Decrease hip and back pain 50% Goal status: INITIAL  3.  Report able to dress without diffiuclty Goal status: INITIAL  4.  Report 50% easier to do her hair Goal status: INITIAL  5.  Increase lumbar ROM to WFL's Goal status: INITIAL   PLAN: PT FREQUENCY: 1-2x/week  PT DURATION: 12 weeks  PLANNED INTERVENTIONS: Therapeutic exercises, Therapeutic activity, Neuromuscular re-education, Balance training, Gait training, Patient/Family education, Joint mobilization, Dry Needling, Electrical stimulation, Spinal mobilization, Cryotherapy, Moist heat, Taping, Traction, Ultrasound, Ionotophoresis 4mg /ml Dexamethasone, and Manual therapy.  PLAN FOR NEXT SESSION: slowly start exercises   Sumner Boast, PT 05/20/2022, 5:54 PM

## 2022-05-26 ENCOUNTER — Encounter: Payer: Self-pay | Admitting: Physical Medicine and Rehabilitation

## 2022-05-26 ENCOUNTER — Ambulatory Visit: Payer: Self-pay

## 2022-05-26 ENCOUNTER — Ambulatory Visit: Payer: 59 | Admitting: Specialist

## 2022-05-26 ENCOUNTER — Ambulatory Visit: Payer: 59 | Admitting: Physical Medicine and Rehabilitation

## 2022-05-26 DIAGNOSIS — M5416 Radiculopathy, lumbar region: Secondary | ICD-10-CM | POA: Diagnosis not present

## 2022-05-26 MED ORDER — METHYLPREDNISOLONE ACETATE 80 MG/ML IJ SUSP
80.0000 mg | Freq: Once | INTRAMUSCULAR | Status: AC
  Administered 2022-05-26: 80 mg

## 2022-05-26 NOTE — Progress Notes (Signed)
Pt lower back pain that travels down her left leg. Pt state walking, standing and sitting makes the pain worse. Pt state she uses heat, ice and pain meds to help ease her pain.  Numeric Pain Rating Scale and Functional Assessment Average Pain 7   In the last MONTH (on 0-10 scale) has pain interfered with the following?  1. General activity like being  able to carry out your everyday physical activities such as walking, climbing stairs, carrying groceries, or moving a chair?  Rating(10)   +Driver, -BT, -Dye Allergies.

## 2022-06-02 ENCOUNTER — Ambulatory Visit: Payer: 59 | Admitting: Nurse Practitioner

## 2022-06-02 ENCOUNTER — Ambulatory Visit: Payer: 59 | Attending: Specialist

## 2022-06-02 NOTE — Progress Notes (Deleted)
   Established Patient Office Visit  Subjective   Patient ID: Kim Watson, female    DOB: 1971-09-07  Age: 51 y.o. MRN: [id number]  No chief complaint on file.   HPI  [name] is here to follow-up on hypertension. Last visit she was started on losartan-hctz 50-12.5mg  daily.   {History (Optional):23778}  ROS    Objective:     There were no vitals taken for this visit. {Vitals History (Optional):23777}  Physical Exam   No results found for any visits on 06/02/22.  {Labs (Optional):23779}  The 10-year ASCVD risk score (Arnett DK, et al., 2019) is: 2.4%    Assessment & Plan:   Problem List Items Addressed This Visit   None   No follow-ups on file.    Gerre Scull, NP

## 2022-06-12 ENCOUNTER — Ambulatory Visit: Payer: 59 | Admitting: Physical Therapy

## 2022-06-18 ENCOUNTER — Ambulatory Visit: Payer: 59 | Admitting: Podiatry

## 2022-06-18 ENCOUNTER — Ambulatory Visit (INDEPENDENT_AMBULATORY_CARE_PROVIDER_SITE_OTHER): Payer: 59

## 2022-06-18 DIAGNOSIS — M79672 Pain in left foot: Secondary | ICD-10-CM

## 2022-06-18 DIAGNOSIS — L84 Corns and callosities: Secondary | ICD-10-CM | POA: Diagnosis not present

## 2022-06-18 DIAGNOSIS — M216X2 Other acquired deformities of left foot: Secondary | ICD-10-CM | POA: Diagnosis not present

## 2022-06-18 DIAGNOSIS — Z01818 Encounter for other preprocedural examination: Secondary | ICD-10-CM

## 2022-06-18 NOTE — Progress Notes (Signed)
Subjective:  Patient ID: Kim Watson, female    DOB: [birth year],  MRN: [id number]  Chief Complaint  Patient presents with   Follow-up    Callus on bilateral ball of feet- patient stated callus on left foot is painful.     51 y.o. female presents with the above complaint.  Patient presents with follow-up of left plantarflexed fifth metatarsal with underlying porokeratosis.  The right side is doing much better but the left side still continues to hurt has progressive gotten worse.  The shaving it down helps some but not completely.  She would like to discuss surgical options at this time.  She denies any other acute complaints.   Review of Systems: Negative except as noted in the HPI. Denies N/V/F/Ch.  Past Medical History:  Diagnosis Date   Anxiety    Arthritis    Depression    Gallbladder problem    GERD (gastroesophageal reflux disease)    High blood pressure    Hypertension    Migraine    Obesity    Obstructive sleep apnea    Vitamin D deficiency     Current Outpatient Medications:    clonazePAM (KLONOPIN) 0.5 MG tablet, Take 0.5 mg by mouth 2 (two) times daily as needed for anxiety., Disp: , Rfl:    clotrimazole-betamethasone (LOTRISONE) cream, Apply 1 application. topically daily., Disp: 30 g, Rfl: 1   cyclobenzaprine (FLEXERIL) 10 MG tablet, Take 1 tablet (10 mg total) by mouth 3 (three) times daily as needed for muscle spasms., Disp: 30 tablet, Rfl: 0   DULoxetine (CYMBALTA) 30 MG capsule, Take 1 capsule (30 mg total) by mouth daily., Disp: 30 capsule, Rfl: 2   HYDROcodone-acetaminophen (NORCO) 10-325 MG tablet, Take 0.5 tablets by mouth every 6 (six) hours as needed., Disp: 15 tablet, Rfl: 0   losartan-hydrochlorothiazide (HYZAAR) 50-12.5 MG tablet, Take 1 tablet by mouth daily., Disp: 30 tablet, Rfl: 1   Multiple Vitamin (MULTIVITAMIN WITH MINERALS) TABS tablet, Take 1 tablet by mouth daily., Disp: , Rfl:    pantoprazole (PROTONIX) 40 MG tablet, Take 1 tablet (40 mg  total) by mouth daily., Disp: 90 tablet, Rfl: 1   pregabalin (LYRICA) 75 MG capsule, Take 1 capsule (75 mg total) by mouth 2 (two) times daily., Disp: 90 capsule, Rfl: 0   valACYclovir (VALTREX) 500 MG tablet, Take 1 tablet (500 mg total) by mouth 2 (two) times daily., Disp: 30 tablet, Rfl: 3   Vitamin D, Ergocalciferol, (DRISDOL) 1.25 MG (50000 UNIT) CAPS capsule, Take 1 capsule (50,000 Units total) by mouth every 7 (seven) days., Disp: 12 capsule, Rfl: 0  Social History   Tobacco Use  Smoking Status Never  Smokeless Tobacco Never    Allergies  Allergen Reactions   Lisinopril Cough and Other (See Comments)    Other reaction(s): Unknown    Nsaids Other (See Comments)   Objective:  There were no vitals filed for this visit. There is no height or weight on file to calculate BMI. Constitutional Well developed. Well nourished.  Vascular Dorsalis pedis pulses palpable bilaterally. Posterior tibial pulses palpable bilaterally. Capillary refill normal to all digits.  No cyanosis or clubbing noted. Pedal hair growth normal.  Neurologic Normal speech. Oriented to person, place, and time. Epicritic sensation to light touch grossly present bilaterally.  Dermatologic Hyperkeratotic lesion with central nucleated core noted to submetatarsal 5 bilaterally.  Mild pain on palpation.  No pinpoint bleeding noted upon debridement underlying plantarflexed fifth metatarsal noted  Orthopedic: Normal joint ROM without pain  or crepitus bilaterally. No visible deformities. No bony tenderness.   Radiographs: 3 views of skeletally mature adult left foot: Plantarflexed fifth metatarsal noted.  No other bony abnormalities identified. Assessment:   1. Plantar flexed metatarsal, left   2. Encounter for preoperative examination for general surgical procedure    Plan:  Patient was evaluated and treated and all questions answered.  Left plantarflexed fifth metatarsal -All questions and concerns were  discussed with the patient in extensive detail -Given that she has failed all conservative treatment options she will benefit from surgical intervention.  I discussed my preoperative intraoperative postop plan in extensive detail.  Given that this is continue to bother her she will benefit from floating osteotomy of the fifth.  I discussed my surgery in extensive detail she states understanding would like to proceed with surgery. -Informed surgical risk consent was reviewed and read aloud to the patient.  I reviewed the films.  I have discussed my findings with the patient in great detail.  I have discussed all risks including but not limited to infection, stiffness, scarring, limp, disability, deformity, damage to blood vessels and nerves, numbness, poor healing, need for braces, arthritis, chronic pain, amputation, death.  All benefits and realistic expectations discussed in great detail.  I have made no promises as to the outcome.  I have provided realistic expectations.  I have offered the patient a 2nd opinion, which they have declined and assured me they preferred to proceed despite the risks   No follow-ups on file.

## 2022-06-19 ENCOUNTER — Encounter: Payer: Self-pay | Admitting: Specialist

## 2022-06-19 ENCOUNTER — Ambulatory Visit: Payer: 59 | Admitting: Specialist

## 2022-06-19 VITALS — BP 106/72 | HR 77 | Ht 66.0 in | Wt 282.0 lb

## 2022-06-19 DIAGNOSIS — M7541 Impingement syndrome of right shoulder: Secondary | ICD-10-CM | POA: Diagnosis not present

## 2022-06-19 NOTE — Patient Instructions (Addendum)
Avoid overhead lifting and overhead use of the arms. Pillows to keep from sleeping directly on the shoulders Limited lifting to less than 10 lbs. Ice or heat for relief. You are intolerant of NSAIDs are helpful so injection treatement and PT with transdermal steroid Iontophoresis  may be necessary.  Stretching exercise help and strengthening is helpful to build endurance.

## 2022-06-19 NOTE — Progress Notes (Signed)
Office Visit Note   Patient: Kim Watson           Date of Birth: 10-22-1971           MRN: [id number] Visit Date: 06/19/2022              Requested by: Gerre Scull, NP [address] [place],  [place] [address] PCP: Gerre Scull, NP   Assessment & Plan: Visit Diagnoses:  1. Impingement syndrome of right shoulder     Plan: Avoid overhead lifting and overhead use of the arms. Pillows to keep from sleeping directly on the shoulders Limited lifting to less than 10 lbs. Ice or heat for relief. NSAIDs are helpful, such as alleve or motrin, be careful not to use in excess as they place burdens on the kidney. Stretching exercise help and strengthening is helpful to build endurance.   Follow-Up Instructions: Return in about 3 weeks (around 07/10/2022).   Orders:  Orders Placed This Encounter  Procedures  . Large Joint Inj: R subacromial bursa   No orders of the defined types were placed in this encounter.     Procedures: Large Joint Inj: R subacromial bursa on 06/19/2022 12:02 PM Indications: pain Details: 25 G 1.5 in needle, anterolateral approach  Arthrogram: No  Medications: 40 mg methylPREDNISolone acetate 40 MG/ML; 3 mL bupivacaine 0.5 % Outcome: tolerated well, no immediate complications Procedure, treatment alternatives, risks and benefits explained, specific risks discussed. Consent was given by the patient. Immediately prior to procedure a time out was called to verify the correct patient, procedure, equipment, support staff and site/side marked as required. Patient was prepped and draped in the usual sterile fashion.     Clinical Data: No additional findings.   Subjective: Chief Complaint  Patient presents with  . Lower Back - Follow-up    Had left L4 TF injection with Dr. Alvester Morin, she states that she got > 50% relief, she has some burning on the Left side and a pinching on the right side. No longer has the burning pain in the left leg     57  year ol female with shoulder pain and difficulty using the shoulder for overhead lifting and pain with sleeping on the right shoulder. No numbness or paresthesias, intolerant of NSAIDs. She take occasional tylenol. History of lumbar degenerative spondylolisthesis. No bowel or bladder difficulty.    Review of Systems  Constitutional: Negative.   HENT: Negative.    Eyes: Negative.   Respiratory: Negative.    Cardiovascular: Negative.   Gastrointestinal: Negative.   Endocrine: Negative.   Genitourinary: Negative.   Musculoskeletal: Negative.   Skin: Negative.   Allergic/Immunologic: Negative.   Neurological: Negative.   Hematological: Negative.   Psychiatric/Behavioral: Negative.      Objective: Vital Signs: BP 106/72 (BP Location: Left Arm, Patient Position: Sitting, Cuff Size: Large)   Pulse 77   Ht 5\' 6"  (1.676 m)   Wt 282 lb (127.9 kg)   BMI 45.52 kg/m   Physical Exam  Right Shoulder Exam   Tenderness  The patient is experiencing tenderness in the acromion.  Range of Motion  Active abduction:  abnormal  Passive abduction:  abnormal  Extension:  normal  External rotation:  abnormal  Forward flexion:  abnormal  Internal rotation 0 degrees:  abnormal  Internal rotation 90 degrees:  abnormal   Muscle Strength  Abduction: 4/5  Internal rotation: 5/5  External rotation: 5/5  Supraspinatus: 5/5  Subscapularis: 5/5  Biceps: 5/5  Tests  Apprehension: positive Impingement: positive  Other  Erythema: absent Scars: absent Sensation: normal Pulse: present    Specialty Comments:  No specialty comments available.  Imaging: No results found.   PMFS History: Patient Active Problem List   Diagnosis Date Noted  . COVID-19 08/20/2022  . Acute nonintractable headache 08/20/2022  . Pain in right shoulder 06/27/2022  . 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/07/2022  . Primary hypertension 05/07/2022  . Neck pain 05/07/2022  . Acute left-sided low back pain without  sciatica 02/24/2022  . Bursitis of left shoulder 01/10/2022  . Dry mouth 01/10/2022  . History of bariatric surgery 01/10/2022  . Elevated blood pressure reading 01/10/2022  . Anxiety and depression 01/10/2022  . Gastroesophageal reflux disease 01/10/2022  . Prediabetes 07/13/2017  . At risk for diabetes mellitus 07/02/2017  . Insulin resistance 07/02/2017  . Vitamin D deficiency 07/02/2017  . Other hyperlipidemia 07/02/2017  . Morbid obesity (HCC) 06/18/2017   Past Medical History:  Diagnosis Date  . Anxiety   . Arthritis   . Depression   . Gallbladder problem   . GERD (gastroesophageal reflux disease)   . High blood pressure   . Hypertension   . Migraine   . Obesity   . Obstructive sleep apnea   . Vitamin D deficiency     Family History  Problem Relation Age of Onset  . Diabetes Maternal Grandmother   . Heart disease Maternal Grandmother   . Diabetes Other   . Hyperlipidemia Other   . Hypertension Other     Past Surgical History:  Procedure Laterality Date  . CHOLECYSTECTOMY    . SLEEVE GASTROPLASTY    . TUBAL LIGATION     Social History   Occupational History  . Occupation: Waitress  Tobacco Use  . Smoking status: Never  . Smokeless tobacco: Never  Vaping Use  . Vaping Use: Never used  Substance and Sexual Activity  . Alcohol use: Yes    Comment: socially  . Drug use: No  . Sexual activity: Not on file

## 2022-06-27 ENCOUNTER — Ambulatory Visit (INDEPENDENT_AMBULATORY_CARE_PROVIDER_SITE_OTHER): Payer: 59

## 2022-06-27 ENCOUNTER — Ambulatory Visit: Payer: 59 | Admitting: Physician Assistant

## 2022-06-27 ENCOUNTER — Ambulatory Visit: Payer: 59 | Admitting: Podiatry

## 2022-06-27 ENCOUNTER — Encounter: Payer: Self-pay | Admitting: Physician Assistant

## 2022-06-27 DIAGNOSIS — M7541 Impingement syndrome of right shoulder: Secondary | ICD-10-CM

## 2022-06-27 DIAGNOSIS — M25511 Pain in right shoulder: Secondary | ICD-10-CM | POA: Diagnosis not present

## 2022-06-27 MED ORDER — HYDROCODONE-ACETAMINOPHEN 10-325 MG PO TABS: 0.5000 | ORAL_TABLET | Freq: Four times a day (QID) | ORAL | 0 refills | Status: DC | PRN

## 2022-06-27 NOTE — Progress Notes (Signed)
Office Visit Note   Patient: Kim Watson           Date of Birth: May 18, 1971           MRN: [id number] Visit Date: 06/27/2022              Requested by: Gerre Scull, NP [address] [place],  [place] [address] PCP: Gerre Scull, NP  Chief Complaint  Patient presents with   Right Shoulder - Follow-up      HPI: Patient is a pleasant 51 year old woman who presents today for continuing problems with her right shoulder.  She says this has been going on "all year ".  She denies any injury that caused this pain to begin.  She does see Dr. Otelia Sergeant for spine including her cervical spine but she said this is separate from her neck pain.  She said the pain starts in her shoulder and radiates down into her arm.  Denies any distal paresthesias or weakness.  She has been seeing Dr. Otelia Sergeant for this since June and he has tried 2 injections on her.  She said the first injection which was in the anterior aspect of her shoulder seem to help her a little bit.  Her last injection which was done approximately 8 days ago was in the back of the shoulder.  Although this problem has been going on for several months she does relate that she did have a fall recently which seem to make the pain worse.  This was after her initial x-rays.  X-rays today were reassuring.  This provided her some temporary relief but she had significant return of her pain on Monday.  She is having difficulty sleeping.  She has also tried physical therapy this year and that has not helped her shoulder either.  She cannot take anti-inflammatories because of previous bariatric surgery.  She is using mostly topical medications and when she needs it hydrocodone  Assessment & Plan: Visit Diagnoses:  1. Impingement syndrome of right shoulder     Plan: Dr. Otelia Sergeant had advised her not to do any overhead lifting as he was concerned for a tendinosis of the biceps.  A lot of her pain today seems to be focused in the shoulder.  Given  that this has been going on for several months and she has tried 2 injections which have not provided her with relief and she has failed a course with physical therapy I recommend her MRI and follow-up with Dr. Harlow Mares PA after this.    Follow-Up Instructions: No follow-ups on file.   Ortho Exam  Patient is alert, oriented, no adenopathy, well-dressed, normal affect, normal respiratory effort. Pleasant to exam.  Her shoulder she does have pain with overhead motion and has some mild limitations.  She does not want to go back behind her back at all.  Cannot appreciate any asymmetry with the biceps.  Distal grip strength and sensation is intact.  She does seem tender over the long head of the biceps anteriorly insertion only mildly positive empty can sign today.  She does have reproduction of her pain anteriorly with speeds testing.  No ecchymosis about her shoulder.  She does have some audible  with internal/external rotation of her shoulder  Imaging: XR Shoulder Right  Result Date: 06/27/2022 Radiographs of her right shoulder reviewed in 3 projections today.  Humeral head is well-seated in the glenoid fossa.  No evidence of dislocation.  No noted degenerative changes no humeral head elevation  no acute fracture  No images are attached to the encounter.  Labs: Lab Results  Component Value Date   HGBA1C 5.6 01/09/2022   HGBA1C 5.3 06/18/2017     Lab Results  Component Value Date   ALBUMIN 3.9 01/09/2022   ALBUMIN 3.5 06/05/2020   ALBUMIN 4.3 06/18/2017    No results found for: "MG" Lab Results  Component Value Date   VD25OH 16.08 (L) 01/09/2022   VD25OH 30.4 06/18/2017    No results found for: "PREALBUMIN"    Latest Ref Rng & Units 01/09/2022    2:33 PM 06/05/2020    6:54 PM 06/18/2017   10:49 AM  CBC EXTENDED  WBC 4.0 - 10.5 K/uL 6.1  8.5  5.0   RBC 3.87 - 5.11 Mil/uL 4.66  4.69  4.67   Hemoglobin 12.0 - 15.0 g/dL 32.4  40.1  02.7   HCT 36.0 - 46.0 % 39.9  41.7  39.9    Platelets 150.0 - 400.0 K/uL 217.0  334    NEUT# 1.4 - 7.7 K/uL 2.9   2.7   Lymph# 0.7 - 4.0 K/uL 2.5   2.0      There is no height or weight on file to calculate BMI.  Orders:  Orders Placed This Encounter  Procedures   XR Shoulder Right   MR SHOULDER RIGHT WO CONTRAST   Meds ordered this encounter  Medications   HYDROcodone-acetaminophen (NORCO) 10-325 MG tablet    Sig: Take 0.5 tablets by mouth every 6 (six) hours as needed.    Dispense:  15 tablet    Refill:  0     Procedures: No procedures performed  Clinical Data: No additional findings.  ROS:  All other systems negative, except as noted in the HPI. Review of Systems  Objective: Vital Signs: There were no vitals taken for this visit.  Specialty Comments:  No specialty comments available.  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/07/2022   Primary hypertension 05/07/2022   Neck pain 05/07/2022   Acute left-sided low back pain without sciatica 02/24/2022   Bursitis of left shoulder 01/10/2022   Dry mouth 01/10/2022   History of bariatric surgery 01/10/2022   Elevated blood pressure reading 01/10/2022   Anxiety and depression 01/10/2022   Gastroesophageal reflux disease 01/10/2022   Prediabetes 07/13/2017   At risk for diabetes mellitus 07/02/2017   Insulin resistance 07/02/2017   Vitamin D deficiency 07/02/2017   Other hyperlipidemia 07/02/2017   Morbid obesity (HCC) 06/18/2017   Past Medical History:  Diagnosis Date   Anxiety    Arthritis    Depression    Gallbladder problem    GERD (gastroesophageal reflux disease)    High blood pressure    Hypertension    Migraine    Obesity    Obstructive sleep apnea    Vitamin D deficiency     Family History  Problem Relation Age of Onset   Diabetes Maternal Grandmother    Heart disease Maternal Grandmother    Diabetes Other    Hyperlipidemia Other    Hypertension Other     Past Surgical  History:  Procedure Laterality Date   CHOLECYSTECTOMY     SLEEVE GASTROPLASTY     TUBAL LIGATION     Social History   Occupational History   Occupation: Waitress  Tobacco Use   Smoking status: Never   Smokeless tobacco: Never  Vaping Use   Vaping Use: Never used  Substance  and Sexual Activity   Alcohol use: Yes    Comment: socially   Drug use: No   Sexual activity: Not on file

## 2022-07-01 ENCOUNTER — Encounter: Payer: Self-pay | Admitting: Nurse Practitioner

## 2022-07-03 ENCOUNTER — Telehealth: Payer: Self-pay | Admitting: Urology

## 2022-07-03 NOTE — Telephone Encounter (Signed)
DOS - 07/28/22  METATARSAL OSTEOTOMY 5TH LEFT --- 34356  [employer] EFFECTIVE DATE - 12/01/21  PLAN DEDUCTIBLE - $0.00 OUT OF POCKET -$1,700.00 W/ $1,700.00 REMAINING COINSURANCE - 25% COPAY - $0.00   PER [employer]'S AUTOMATIVE SYSTEM FOR CPT CODE 86168 NO PRIOR AUTH IS REQUIRED.  REF # [id number]

## 2022-07-09 ENCOUNTER — Other Ambulatory Visit: Payer: 59

## 2022-07-10 ENCOUNTER — Other Ambulatory Visit: Payer: Self-pay | Admitting: Nurse Practitioner

## 2022-07-11 NOTE — Progress Notes (Unsigned)
   Established Patient Office Visit  Subjective   Patient ID: Kim Watson, female    DOB: October 12, 1971  Age: 51 y.o. MRN: [id number]  No chief complaint on file.   HPI  JAMESA TEDRICK is here to follow-up on chronic disease management.   {History (Optional):23778}  ROS    Objective:     There were no vitals taken for this visit. {Vitals History (Optional):23777}  Physical Exam   No results found for any visits on 07/14/22.  {Labs (Optional):23779}  The 10-year ASCVD risk score (Arnett DK, et al., 2019) is: 1.4%    Assessment & Plan:   Problem List Items Addressed This Visit   None   No follow-ups on file.    Gerre Scull, NP

## 2022-07-14 ENCOUNTER — Ambulatory Visit (INDEPENDENT_AMBULATORY_CARE_PROVIDER_SITE_OTHER): Payer: Commercial Managed Care - HMO | Admitting: Nurse Practitioner

## 2022-07-14 ENCOUNTER — Encounter: Payer: Self-pay | Admitting: Nurse Practitioner

## 2022-07-14 VITALS — BP 130/86 | HR 79 | Temp 96.8°F | Wt 280.8 lb

## 2022-07-14 DIAGNOSIS — R7303 Prediabetes: Secondary | ICD-10-CM | POA: Diagnosis not present

## 2022-07-14 DIAGNOSIS — F32A Depression, unspecified: Secondary | ICD-10-CM

## 2022-07-14 DIAGNOSIS — Z1231 Encounter for screening mammogram for malignant neoplasm of breast: Secondary | ICD-10-CM

## 2022-07-14 DIAGNOSIS — I10 Essential (primary) hypertension: Secondary | ICD-10-CM

## 2022-07-14 DIAGNOSIS — E559 Vitamin D deficiency, unspecified: Secondary | ICD-10-CM

## 2022-07-14 DIAGNOSIS — F419 Anxiety disorder, unspecified: Secondary | ICD-10-CM | POA: Diagnosis not present

## 2022-07-14 LAB — COMPREHENSIVE METABOLIC PANEL
ALT: 24 U/L (ref 0–35)
AST: 18 U/L (ref 0–37)
Albumin: 4 g/dL (ref 3.5–5.2)
Alkaline Phosphatase: 66 U/L (ref 39–117)
BUN: 9 mg/dL (ref 6–23)
CO2: 27 mEq/L (ref 19–32)
Calcium: 9 mg/dL (ref 8.4–10.5)
Chloride: 102 mEq/L (ref 96–112)
Creatinine, Ser: 0.82 mg/dL (ref 0.40–1.20)
GFR: 83.12 mL/min (ref >60.00)
Glucose, Bld: 86 mg/dL (ref 70–99)
Potassium: 3.7 mEq/L (ref 3.5–5.1)
Sodium: 139 mEq/L (ref 135–145)
Total Bilirubin: 0.3 mg/dL (ref 0.2–1.2)
Total Protein: 7.1 g/dL (ref 6.0–8.3)

## 2022-07-14 LAB — HEMOGLOBIN A1C: Hgb A1c MFr Bld: 5.8 % (ref 4.6–6.5)

## 2022-07-14 LAB — CBC
HCT: 39.7 % (ref 36.0–46.0)
Hemoglobin: 13 g/dL (ref 12.0–15.0)
MCHC: 32.6 g/dL (ref 30.0–36.0)
MCV: 88.1 fl (ref 78.0–100.0)
Platelets: 239 10*3/uL (ref 150.0–400.0)
RBC: 4.51 Mil/uL (ref 3.87–5.11)
RDW: 14.7 % (ref 11.5–15.5)
WBC: 6.7 10*3/uL (ref 4.0–10.5)

## 2022-07-14 LAB — VITAMIN D 25 HYDROXY (VIT D DEFICIENCY, FRACTURES): VITD: 20.34 ng/mL — ABNORMAL LOW (ref 30.00–100.00)

## 2022-07-14 MED ORDER — SEMAGLUTIDE(0.25 OR 0.5MG/DOS) 2 MG/1.5ML ~~LOC~~ SOPN: 0.2500 mg | PEN_INJECTOR | SUBCUTANEOUS | 0 refills | Status: DC

## 2022-07-14 MED ORDER — CLONAZEPAM 0.5 MG PO TABS: 0.5000 mg | ORAL_TABLET | Freq: Two times a day (BID) | ORAL | 0 refills | Status: DC | PRN

## 2022-07-14 MED ORDER — DULOXETINE HCL 60 MG PO CPEP: 60.0000 mg | ORAL_CAPSULE | Freq: Every day | ORAL | 0 refills | Status: DC

## 2022-07-14 NOTE — Assessment & Plan Note (Addendum)
Chronic, stable.  She has been out of her medication for the last week and her blood pressure is 130/86.  She states that she feels better overall being on the blood pressure medication.  We will check a CMP, CBC today.  Continue losartan-hydrochlorothiazide 50-12.5 mg daily.  Follow-up in 6 months.

## 2022-07-14 NOTE — Assessment & Plan Note (Signed)
Chronic, ongoing.  She states that her anxiety has slightly worsened recently and she is having some more crying spells.  She has been having an increase stress with one of her sons.  We will increase her duloxetine to 60 mg daily.  Refill Klonopin 0.5 mg twice a day as needed for anxiety.  PDMP reviewed.  Follow-up in 4 to 6 weeks.

## 2022-07-14 NOTE — Assessment & Plan Note (Signed)
EMI 45.3.  She states that this is the highest weight she has ever been.  She has been trying to adjust her nutrition and eating smaller portion sizes.  She is interested in medication to see if this will help with weight loss.  We will start semaglutide 0.25 mg injection weekly for 4 weeks, then increase to 2.5 mg weekly.  Discussed possible side effects.  Follow-up in 4 to 6 weeks.

## 2022-07-14 NOTE — Patient Instructions (Addendum)
It was great to see you!  We have refilled your medications. Start vitamin B12 supplement 1,043mcg daily.   I have increased your duloxetine to 60 mg daily.   I have placed an order for screening mammogram, they should call you to schedule. If you do not hear from them in the next week, please call:  [facility] [address] [place], [address] [place], [place] [phone]  Start semaglutide 0.25mg  in injection once a week for 4 weeks, then increase to 0.5mg  injection weekly.   We are checking your labs today and will let you know the results via mychart/phone.   Let's follow-up in 4-6 weeks, sooner if you have concerns.  If a referral was placed today, you will be contacted for an appointment. Please note that routine referrals can sometimes take up to 3-4 weeks to process. Please call our office if you haven't heard anything after this time frame.  Take care,  Rodman Pickle, NP

## 2022-07-14 NOTE — Assessment & Plan Note (Addendum)
History of vitamin D deficiency, will check levels today and treat based on results. ?

## 2022-07-14 NOTE — Assessment & Plan Note (Signed)
We will check A1c today and treat based on results.  Encourage limiting sugars, carbohydrates, along with exercise.

## 2022-07-16 ENCOUNTER — Encounter: Payer: Self-pay | Admitting: Nurse Practitioner

## 2022-07-17 ENCOUNTER — Other Ambulatory Visit: Payer: Self-pay | Admitting: Nurse Practitioner

## 2022-07-18 ENCOUNTER — Other Ambulatory Visit: Payer: Self-pay | Admitting: Diagnostic Radiology

## 2022-07-22 ENCOUNTER — Ambulatory Visit (INDEPENDENT_AMBULATORY_CARE_PROVIDER_SITE_OTHER): Payer: Commercial Managed Care - HMO

## 2022-07-22 DIAGNOSIS — Z23 Encounter for immunization: Secondary | ICD-10-CM

## 2022-07-22 NOTE — Progress Notes (Signed)
After obtaining consent, and per orders of Rodman Pickle, NP 2nd dose injection of Shingles vaccine given IM in the left deltoid by Dominic Pea, CMA. Patient tolerated injection well. Patient instructed to report any adverse reactions to me immediately.

## 2022-07-23 ENCOUNTER — Encounter: Payer: Self-pay | Admitting: Nurse Practitioner

## 2022-07-23 ENCOUNTER — Encounter: Payer: Self-pay | Admitting: Specialist

## 2022-07-23 ENCOUNTER — Ambulatory Visit: Payer: Commercial Managed Care - HMO | Admitting: Specialist

## 2022-07-23 VITALS — BP 117/79 | HR 90 | Ht 66.0 in | Wt 282.0 lb

## 2022-07-23 DIAGNOSIS — M47812 Spondylosis without myelopathy or radiculopathy, cervical region: Secondary | ICD-10-CM

## 2022-07-23 DIAGNOSIS — M7541 Impingement syndrome of right shoulder: Secondary | ICD-10-CM | POA: Diagnosis not present

## 2022-07-23 DIAGNOSIS — M7552 Bursitis of left shoulder: Secondary | ICD-10-CM | POA: Diagnosis not present

## 2022-07-23 DIAGNOSIS — M7542 Impingement syndrome of left shoulder: Secondary | ICD-10-CM | POA: Diagnosis not present

## 2022-07-23 DIAGNOSIS — M4316 Spondylolisthesis, lumbar region: Secondary | ICD-10-CM

## 2022-07-23 NOTE — Patient Instructions (Signed)
  Plan:Avoid overhead lifting and overhead use of the arms. Do not lift greater than 5 lbs. Adjust head rest in vehicle to prevent hyperextension if rear ended. Take extra precautions to avoid falling,. Avoid overhead lifting and overhead use of the arms. Pillows to keep from sleeping directly on the shoulders Limited lifting to less than 10 lbs. Ice or heat for relief. NSAIDs are not recommended due to history of gastric bypass surgery, voltaren gel is sometime able to improve the pain.  Stretching exercise help and strengthening is helpful to build endurance. Hot showers in the AM.  Injection with steroid may be of benefit. Hemp CBD capsules, amazon.com 5,000-7,000 mg per bottle, 60 capsules per bottle, take one capsule twice a day. Cane in the left hand to use with left leg weight bearing. Follow-Up Instructions: No follow-ups on file.   As you have had PT and injection without relief of right arm and shoulder pain MRI of both the cervical spine and the right shoulder are ordered.  Follow-Up Instructions: No follow-ups on file.

## 2022-07-23 NOTE — Progress Notes (Signed)
Office Visit Note   Patient: Kim Watson           Date of Birth: [birth year]           MRN: [id number] Visit Date: 07/23/2022              Requested by: Gerre Scull, NP [address] [place],  [place] [address] PCP: Gerre Scull, NP   Assessment & Plan: Visit Diagnoses:  1. Impingement syndrome of right shoulder   2. Spondylosis without myelopathy or radiculopathy, cervical region   3. Impingement syndrome of left shoulder   4. Bursitis of left shoulder   5. Spondylolisthesis, lumbar region     Plan:Avoid overhead lifting and overhead use of the arms. Do not lift greater than 5 lbs. Adjust head rest in vehicle to prevent hyperextension if rear ended. Take extra precautions to avoid falling,. Avoid overhead lifting and overhead use of the arms. Pillows to keep from sleeping directly on the shoulders Limited lifting to less than 10 lbs. Ice or heat for relief. NSAIDs are not recommended due to history of gastric bypass surgery, voltaren gel is sometime able to improve the pain.  Stretching exercise help and strengthening is helpful to build endurance. Hot showers in the AM.  Injection with steroid may be of benefit. Hemp CBD capsules, amazon.com 5,000-7,000 mg per bottle, 60 capsules per bottle, take one capsule twice a day. Cane in the left hand to use with left leg weight bearing. Follow-Up Instructions: No follow-ups on file.   As you have had PT and injection without relief of right arm and shoulder pain MRI of both the cervical spine and the right shoulder are ordered.  Follow-Up Instructions: No follow-ups on file.   Orders:  No orders of the defined types were placed in this encounter.  No orders of the defined types were placed in this encounter.     Procedures: No procedures performed   Clinical Data: No additional findings.   Subjective: Chief Complaint  Patient presents with   Right Shoulder - Follow-up   Neck - Follow-up, Pain     51 year old female right handed with right handed with over 6 month history of right neck pain and right shoulder pain. She was seen by primary care and underwent therapy for the right shoulder for 4-6 weeks with no improvement seen in 05/2022 and had a right subacromial injection with marcaine and steriod with only temporary improvement in symptoms. Saw PA at her last visit and an MRI of the  right shoulder was ordered. Her insurance has since changed and MRI was not done. Pain in the right shoulder persists and she has right occipital pain and pain in the right upper cervical region. There is stiffness with ROM of the cervical spine with right arm numbness int the rightr lateral upper arm laterally with some radiation distally into the right dorsolateral forearm. There is night pain. Right shoulder is painful with over head use of the arm and with overhead lifting. There is pain with lying on the right arm and shoulder.    Review of Systems  Constitutional: Negative.   HENT: Negative.    Eyes: Negative.   Respiratory: Negative.    Cardiovascular: Negative.   Gastrointestinal: Negative.   Endocrine: Negative.   Genitourinary: Negative.   Musculoskeletal: Negative.   Skin: Negative.   Allergic/Immunologic: Negative.   Neurological: Negative.   Hematological: Negative.   Psychiatric/Behavioral: Negative.  Objective: Vital Signs: BP 117/79 (BP Location: Left Arm, Patient Position: Sitting)   Pulse 90   Ht 5\' 6"  (1.676 m)   Wt 282 lb (127.9 kg)   BMI 45.52 kg/m   Physical Exam Constitutional:      Appearance: She is well-developed.  HENT:     Head: Normocephalic and atraumatic.  Eyes:     Pupils: Pupils are equal, round, and reactive to light.  Pulmonary:     Effort: Pulmonary effort is normal.     Breath sounds: Normal breath sounds.  Abdominal:     General: Bowel sounds are normal.     Palpations: Abdomen is soft.  Musculoskeletal:     Cervical back: Normal range  of motion and neck supple.  Skin:    General: Skin is warm and dry.  Neurological:     Mental Status: She is alert and oriented to person, place, and time.  Psychiatric:        Behavior: Behavior normal.        Thought Content: Thought content normal.        Judgment: Judgment normal.    Back Exam   Tenderness  The patient is experiencing tenderness in the cervical.  Range of Motion  Extension:  abnormal  Flexion:  normal  Lateral bend right:  60  Lateral bend left:  60   Muscle Strength  Right Quadriceps:  5/5  Left Quadriceps:  5/5  Right Hamstrings:  5/5  Left Hamstrings:  5/5   Comments:  Motor is normal both legs. R negative both legs.    Right Shoulder Exam   Tenderness  The patient is experiencing tenderness in the acromion.  Range of Motion  Active abduction:  abnormal  Passive abduction:  abnormal  Extension:  normal  External rotation:  abnormal   Muscle Strength  Abduction: 4/5  Internal rotation: 5/5  External rotation: 4/5  Supraspinatus: 4/5   Tests  Impingement: positive  Other  Erythema: absent Scars: absent Sensation: decreased Pulse: present   Left Shoulder Exam  Left shoulder exam is normal.      Specialty Comments:  No specialty comments available.  Imaging: No results found.   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   Primary hypertension 05/07/2022   Neck pain 05/07/2022   Acute left-sided low back pain without sciatica 02/24/2022   Bursitis of left shoulder 01/10/2022   Dry mouth 01/10/2022   History of bariatric surgery 01/10/2022   Elevated blood pressure reading 01/10/2022   Anxiety and depression 01/10/2022   Gastroesophageal reflux disease 01/10/2022   Prediabetes 07/13/2017   At risk for diabetes mellitus 07/02/2017   Insulin resistance 07/02/2017   Vitamin D deficiency 07/02/2017   Other hyperlipidemia  07/02/2017   Morbid obesity (HCC) 06/18/2017   Past Medical History:  Diagnosis Date   Anxiety    Arthritis    Depression    Gallbladder problem    GERD (gastroesophageal reflux disease)    High blood pressure    Hypertension    Migraine    Obesity    Obstructive sleep apnea    Vitamin D deficiency     Family History  Problem Relation Age of Onset   Diabetes Maternal Grandmother    Heart disease Maternal Grandmother    Diabetes Other    Hyperlipidemia Other    Hypertension Other     Past Surgical History:  Procedure Laterality Date   CHOLECYSTECTOMY     SLEEVE GASTROPLASTY     TUBAL LIGATION     Social History   Occupational History   Occupation: Waitress  Tobacco Use   Smoking status: Never   Smokeless tobacco: Never  Vaping Use   Vaping Use: Never used  Substance and Sexual Activity   Alcohol use: Yes    Comment: socially   Drug use: No   Sexual activity: Not on file

## 2022-07-25 ENCOUNTER — Encounter: Payer: Self-pay | Admitting: Nurse Practitioner

## 2022-07-26 HISTORY — PX: FOOT SURGERY: SHX648

## 2022-07-28 ENCOUNTER — Encounter: Payer: Self-pay | Admitting: Podiatry

## 2022-07-28 ENCOUNTER — Other Ambulatory Visit: Payer: Self-pay | Admitting: Podiatry

## 2022-07-28 DIAGNOSIS — M21542 Acquired clubfoot, left foot: Secondary | ICD-10-CM

## 2022-07-28 MED ORDER — IBUPROFEN 800 MG PO TABS: 800.0000 mg | ORAL_TABLET | Freq: Four times a day (QID) | ORAL | 1 refills | Status: DC | PRN

## 2022-07-28 MED ORDER — OXYCODONE-ACETAMINOPHEN 5-325 MG PO TABS: 1.0000 | ORAL_TABLET | ORAL | 0 refills | Status: DC | PRN

## 2022-07-29 ENCOUNTER — Encounter: Payer: Self-pay | Admitting: Specialist

## 2022-07-30 ENCOUNTER — Telehealth: Payer: Self-pay | Admitting: Nurse Practitioner

## 2022-07-30 NOTE — Telephone Encounter (Signed)
Caller Name: Rosann Auerbach Amil Amen) Call back phone #: [phone], ext [phone]  Reason for Call: Denied ozempic due to insufficient clinical information. Advised next step to be peer to peer meeting (by calling [phone], opt 3). Call [employer] with any questions and pt with what is decided. Thank you

## 2022-07-31 ENCOUNTER — Telehealth: Payer: Self-pay | Admitting: Specialist

## 2022-07-31 MED ORDER — TRULICITY 0.75 MG/0.5ML ~~LOC~~ SOAJ: 0.7500 mg | SUBCUTANEOUS | 1 refills | Status: DC

## 2022-07-31 NOTE — Telephone Encounter (Signed)
Called pt 1X and left vm for pt to set an MRI review appt with Dr Otelia Sergeant after 9/7/

## 2022-07-31 NOTE — Telephone Encounter (Signed)
Kim Watson returned the call, she said to call [phone] and to ask for peer to peer for pharmacy reconsideration

## 2022-07-31 NOTE — Telephone Encounter (Signed)
Called and informed pt of provider comments from peer to peer review. Pt voiced understanding. Sw, cma

## 2022-08-01 ENCOUNTER — Other Ambulatory Visit: Payer: Self-pay | Admitting: Radiology

## 2022-08-01 DIAGNOSIS — M4316 Spondylolisthesis, lumbar region: Secondary | ICD-10-CM

## 2022-08-01 DIAGNOSIS — M5416 Radiculopathy, lumbar region: Secondary | ICD-10-CM

## 2022-08-01 DIAGNOSIS — M5126 Other intervertebral disc displacement, lumbar region: Secondary | ICD-10-CM

## 2022-08-01 DIAGNOSIS — M545 Low back pain, unspecified: Secondary | ICD-10-CM

## 2022-08-06 ENCOUNTER — Ambulatory Visit (INDEPENDENT_AMBULATORY_CARE_PROVIDER_SITE_OTHER): Payer: Commercial Managed Care - HMO

## 2022-08-06 ENCOUNTER — Telehealth: Payer: Self-pay | Admitting: *Deleted

## 2022-08-06 ENCOUNTER — Ambulatory Visit (INDEPENDENT_AMBULATORY_CARE_PROVIDER_SITE_OTHER): Payer: Commercial Managed Care - HMO | Admitting: Podiatry

## 2022-08-06 DIAGNOSIS — M216X2 Other acquired deformities of left foot: Secondary | ICD-10-CM | POA: Diagnosis not present

## 2022-08-06 DIAGNOSIS — Z9889 Other specified postprocedural states: Secondary | ICD-10-CM

## 2022-08-06 NOTE — Telephone Encounter (Signed)
Patient is calling to ask if she can take boot off while resting ,can she take a shower, get foot wet or does she still have to keep foot dry? Please advise.

## 2022-08-06 NOTE — Progress Notes (Signed)
Subjective:  Patient ID: Kim Watson, female    DOB: Jun 21, 1971,  MRN: [id number]  Chief Complaint  Patient presents with   Routine Post Op    POV #1 DOS 07/28/2022 LT 5TH FLOATING OSTEOTOMY    DOS: 07/28/2022 Procedure: Left fifth floating osteotomy  51 y.o. female returns for post-op check.  Patient states she is doing well.  Pain controlled.  Bandages clean dry and intact.  No nausea fever chills vomiting  Review of Systems: Negative except as noted in the HPI. Denies N/V/F/Ch.  Past Medical History:  Diagnosis Date   Anxiety    Arthritis    Depression    Gallbladder problem    GERD (gastroesophageal reflux disease)    High blood pressure    Hypertension    Migraine    Obesity    Obstructive sleep apnea    Vitamin D deficiency     Current Outpatient Medications:    clonazePAM (KLONOPIN) 0.5 MG tablet, Take 1 tablet (0.5 mg total) by mouth 2 (two) times daily as needed for anxiety., Disp: 30 tablet, Rfl: 0   clotrimazole-betamethasone (LOTRISONE) cream, Apply 1 application. topically daily., Disp: 30 g, Rfl: 1   cyclobenzaprine (FLEXERIL) 10 MG tablet, Take 1 tablet (10 mg total) by mouth 3 (three) times daily as needed for muscle spasms., Disp: 30 tablet, Rfl: 0   Dulaglutide (TRULICITY) 0.75 MG/0.5ML SOPN, Inject 0.75 mg into the skin once a week., Disp: 2 mL, Rfl: 1   DULoxetine (CYMBALTA) 60 MG capsule, Take 1 capsule (60 mg total) by mouth daily., Disp: 90 capsule, Rfl: 0   HYDROcodone-acetaminophen (NORCO) 10-325 MG tablet, Take 0.5 tablets by mouth every 6 (six) hours as needed., Disp: 15 tablet, Rfl: 0   ibuprofen (ADVIL) 800 MG tablet, Take 1 tablet (800 mg total) by mouth every 6 (six) hours as needed., Disp: 60 tablet, Rfl: 1   losartan-hydrochlorothiazide (HYZAAR) 50-12.5 MG tablet, TAKE 1 TABLET BY MOUTH DAILY, Disp: 90 tablet, Rfl: 1   Multiple Vitamin (MULTIVITAMIN WITH MINERALS) TABS tablet, Take 1 tablet by mouth daily., Disp: , Rfl:     oxyCODONE-acetaminophen (PERCOCET) 5-325 MG tablet, Take 1 tablet by mouth every 4 (four) hours as needed for severe pain., Disp: 30 tablet, Rfl: 0   pantoprazole (PROTONIX) 40 MG tablet, Take 1 tablet (40 mg total) by mouth daily., Disp: 90 tablet, Rfl: 1   pregabalin (LYRICA) 75 MG capsule, Take 1 capsule (75 mg total) by mouth 2 (two) times daily., Disp: 90 capsule, Rfl: 0   valACYclovir (VALTREX) 500 MG tablet, Take 1 tablet (500 mg total) by mouth 2 (two) times daily., Disp: 30 tablet, Rfl: 3   Vitamin D, Ergocalciferol, (DRISDOL) 1.25 MG (50000 UNIT) CAPS capsule, TAKE 1 CAPSULE BY MOUTH EVERY 7 DAYS, Disp: 12 capsule, Rfl: 0  Social History   Tobacco Use  Smoking Status Never  Smokeless Tobacco Never    Allergies  Allergen Reactions   Lisinopril Cough and Other (See Comments)    Other reaction(s): Unknown    Nsaids Other (See Comments)   Objective:  There were no vitals filed for this visit. There is no height or weight on file to calculate BMI. Constitutional Well developed. Well nourished.  Vascular Foot warm and well perfused. Capillary refill normal to all digits.   Neurologic Normal speech. Oriented to person, place, and time. Epicritic sensation to light touch grossly present bilaterally.  Dermatologic Skin healing well without signs of infection. Skin edges well coapted without signs of  infection.  Orthopedic: Tenderness to palpation noted about the surgical site.   Radiographs: 3 views discussedSkeletally mature on left foot: Osteotomy noted across the fifth metatarsal.  Good correction alignment noted reduction of deformity noted.  No other bony abnormalities noted. Assessment:   1. Plantar flexed metatarsal, left   2. S/P foot surgery    Plan:  Patient was evaluated and treated and all questions answered.  S/p foot surgery left -Progressing as expected post-operatively. -XR: See above -WB Status: Weightbearing as tolerated in surgical shoe -Sutures:  Intact.  No clinical signs of Deis is noted.  No complication noted. -Medications: None -Foot redressed.  No follow-ups on file.

## 2022-08-07 ENCOUNTER — Ambulatory Visit
Admission: RE | Admit: 2022-08-07 | Discharge: 2022-08-07 | Disposition: A | Discharge status: Home / Self Care | Payer: Commercial Managed Care - HMO | Source: Ambulatory Visit | Attending: Specialist | Admitting: Specialist

## 2022-08-07 DIAGNOSIS — M7541 Impingement syndrome of right shoulder: Secondary | ICD-10-CM

## 2022-08-07 DIAGNOSIS — M47812 Spondylosis without myelopathy or radiculopathy, cervical region: Secondary | ICD-10-CM

## 2022-08-11 ENCOUNTER — Telehealth: Payer: Self-pay

## 2022-08-11 NOTE — Telephone Encounter (Signed)
FYI-  Please advise Dr. Otelia Sergeant of patient's results of MRI Cervical Spine.  Thank you.

## 2022-08-11 NOTE — Telephone Encounter (Signed)
Printed and gave message to Dr. Otelia Sergeant to review-

## 2022-08-13 NOTE — Telephone Encounter (Signed)
Note read and patient called.

## 2022-08-14 ENCOUNTER — Ambulatory Visit: Payer: Commercial Managed Care - HMO | Admitting: Nurse Practitioner

## 2022-08-14 ENCOUNTER — Ambulatory Visit: Payer: Commercial Managed Care - HMO

## 2022-08-20 ENCOUNTER — Ambulatory Visit (INDEPENDENT_AMBULATORY_CARE_PROVIDER_SITE_OTHER): Payer: Commercial Managed Care - HMO | Admitting: Podiatry

## 2022-08-20 ENCOUNTER — Encounter: Payer: Self-pay | Admitting: Nurse Practitioner

## 2022-08-20 ENCOUNTER — Ambulatory Visit (INDEPENDENT_AMBULATORY_CARE_PROVIDER_SITE_OTHER): Payer: Commercial Managed Care - HMO | Admitting: Nurse Practitioner

## 2022-08-20 VITALS — BP 132/88 | HR 79 | Temp 97.3°F | Wt 279.4 lb

## 2022-08-20 DIAGNOSIS — R519 Headache, unspecified: Secondary | ICD-10-CM | POA: Insufficient documentation

## 2022-08-20 DIAGNOSIS — U071 COVID-19: Secondary | ICD-10-CM | POA: Insufficient documentation

## 2022-08-20 DIAGNOSIS — M216X2 Other acquired deformities of left foot: Secondary | ICD-10-CM

## 2022-08-20 DIAGNOSIS — Z9889 Other specified postprocedural states: Secondary | ICD-10-CM

## 2022-08-20 LAB — POC COVID19 BINAXNOW: SARS Coronavirus 2 Ag: POSITIVE — AB

## 2022-08-20 MED ORDER — SUMATRIPTAN SUCCINATE 25 MG PO TABS: 25.0000 mg | ORAL_TABLET | ORAL | 0 refills | Status: DC | PRN

## 2022-08-20 MED ORDER — KETOROLAC TROMETHAMINE 60 MG/2ML IM SOLN
30.0000 mg | Freq: Once | INTRAMUSCULAR | Status: AC
  Administered 2022-08-20: 30 mg via INTRAMUSCULAR

## 2022-08-20 MED ORDER — ONDANSETRON HCL 4 MG PO TABS: 4.0000 mg | ORAL_TABLET | Freq: Three times a day (TID) | ORAL | 0 refills | Status: DC | PRN

## 2022-08-20 MED ORDER — NIRMATRELVIR/RITONAVIR (PAXLOVID)TABLET: 3.0000 | ORAL_TABLET | Freq: Two times a day (BID) | ORAL | 0 refills | Status: AC

## 2022-08-20 NOTE — Patient Instructions (Signed)
It was great to see you!  Do not take the percocet while you are taking paxlovid. Take paxlovid twice a day for 5 days. Make sure you are drinking plenty of fluids. You need to isolate until Friday, you can go back to work Monday.   Let's follow-up if your symptoms worsen or don't improve  Take care,  Vance Peper, NP

## 2022-08-20 NOTE — Progress Notes (Signed)
Acute Office Visit  Subjective:     Patient ID: Kim Watson, female    DOB: 1971/07/05, 51 y.o.   MRN: [id number]  Chief Complaint  Patient presents with   Headache    Pt stated having headache since Monday evening.She has taken meds for pain with no relief. Pain rate is about a 7.Nausea and vomiting as well.    Patient is in today for severe headache since Monday.  She states this is associated with nausea and vomiting.  She has some history of migraines, however she has not had this recently.  She also states that she has been experiencing a little scratchy throat and some ear popping.  She denies feeling sick or recent sick contacts.  The headache is associated with sensitivity to light and sound.  She has been taking Tylenol, she tried a Percocet yesterday, and none of this is helping.  Her pain is currently a 7/10.  She denies slurred speech, confusion, weakness on one side of her body.  ROS See pertinent positives and negatives per HPI.     Objective:    BP 132/88 (BP Location: Right Arm, Cuff Size: Large)   Pulse 79   Temp (!) 97.3 F (36.3 C) (Temporal)   Wt 279 lb 6.4 oz (126.7 kg) Comment: Pt had a boot on the left leg.  SpO2 99%   BMI 45.10 kg/m    Physical Exam Vitals and nursing note reviewed.  Constitutional:      General: She is not in acute distress.    Appearance: Normal appearance.  HENT:     Head: Normocephalic.  Eyes:     Conjunctiva/sclera: Conjunctivae normal.  Cardiovascular:     Rate and Rhythm: Normal rate and regular rhythm.     Pulses: Normal pulses.     Heart sounds: Normal heart sounds.  Pulmonary:     Effort: Pulmonary effort is normal.     Breath sounds: Normal breath sounds.  Musculoskeletal:     Cervical back: Normal range of motion and neck supple. No tenderness.  Lymphadenopathy:     Cervical: No cervical adenopathy.  Skin:    General: Skin is warm.  Neurological:     General: No focal deficit present.     Mental Status:  She is alert and oriented to person, place, and time.  Psychiatric:        Mood and Affect: Mood normal.        Behavior: Behavior normal.        Thought Content: Thought content normal.        Judgment: Judgment normal.     Results for orders placed or performed in visit on 08/20/22  POC COVID-19 BinaxNow  Result Value Ref Range   SARS Coronavirus 2 Ag Positive (A) Negative        Assessment & Plan:   Problem List Items Addressed This Visit       Other   COVID-19    Positive point-of-care COVID in office.  We will have her start Paxlovid twice a day.  Discussed not to take Percocet with this.  Encourage fluids, rest.  Work note given.  Reviewed home care instructions for COVID. Advised self-isolation at home for at least 5 days. After 5 days, if improved and fever resolved, can be in public, but should wear a mask around others for an additional 5 days. If symptoms, esp, dyspnea develops/worsens, recommend in-person evaluation at either an urgent care or the emergency room.  Relevant Medications   nirmatrelvir/ritonavir EUA (PAXLOVID) 20 x 150 MG & 10 x 100MG  TABS   Acute nonintractable headache - Primary    Acute headache for the past 3 days.  She has been taking Tylenol and Percocet which have not helped.  She cannot take ibuprofen due to history of gastric bypass.  No red flags on exam.  She is positive for COVID, most likely related to this.  We will give her Toradol 30 mg IM in office.  We will also give some Zofran as needed for nausea.  Follow-up if symptoms do not improve or worsen.      Relevant Orders   POC COVID-19 BinaxNow (Completed)    Meds ordered this encounter  Medications   ondansetron (ZOFRAN) 4 MG tablet    Sig: Take 1 tablet (4 mg total) by mouth every 8 (eight) hours as needed for nausea or vomiting.    Dispense:  30 tablet    Refill:  0   ketorolac (TORADOL) injection 30 mg   DISCONTD: SUMAtriptan (IMITREX) 25 MG tablet    Sig: Take 1  tablet (25 mg total) by mouth every 2 (two) hours as needed for migraine. May repeat in 2 hours if headache persists or recurs.    Dispense:  10 tablet    Refill:  0   nirmatrelvir/ritonavir EUA (PAXLOVID) 20 x 150 MG & 10 x 100MG  TABS    Sig: Take 3 tablets by mouth 2 (two) times daily for 5 days. (Take nirmatrelvir 150 mg two tablets twice daily for 5 days and ritonavir 100 mg one tablet twice daily for 5 days) Patient GFR is 83    Dispense:  30 tablet    Refill:  0    Return if symptoms worsen or fail to improve.  , NP

## 2022-08-20 NOTE — Assessment & Plan Note (Signed)
Acute headache for the past 3 days.  She has been taking Tylenol and Percocet which have not helped.  She cannot take ibuprofen due to history of gastric bypass.  No red flags on exam.  She is positive for COVID, most likely related to this.  We will give her Toradol 30 mg IM in office.  We will also give some Zofran as needed for nausea.  Follow-up if symptoms do not improve or worsen.

## 2022-08-20 NOTE — Progress Notes (Signed)
Subjective:  Patient ID: Kim Watson, female    DOB: 08-Apr-1971,  MRN: [id number]  Chief Complaint  Patient presents with   Routine Post Op    POV #2 DOS 07/28/2022 LT 5TH FLOATING OSTEOTOMY    DOS: 07/28/2022 Procedure: Left fifth floating osteotomy  51 y.o. female returns for post-op check.  Patient states she is doing well.  No pain bandages clean dry and intact.  Review of Systems: Negative except as noted in the HPI. Denies N/V/F/Ch.  Past Medical History:  Diagnosis Date   Anxiety    Arthritis    Depression    Gallbladder problem    GERD (gastroesophageal reflux disease)    High blood pressure    Hypertension    Migraine    Obesity    Obstructive sleep apnea    Vitamin D deficiency     Current Outpatient Medications:    clonazePAM (KLONOPIN) 0.5 MG tablet, Take 1 tablet (0.5 mg total) by mouth 2 (two) times daily as needed for anxiety., Disp: 30 tablet, Rfl: 0   clotrimazole-betamethasone (LOTRISONE) cream, Apply 1 application. topically daily., Disp: 30 g, Rfl: 1   cyclobenzaprine (FLEXERIL) 10 MG tablet, Take 1 tablet (10 mg total) by mouth 3 (three) times daily as needed for muscle spasms., Disp: 30 tablet, Rfl: 0   Dulaglutide (TRULICITY) 0.75 MG/0.5ML SOPN, Inject 0.75 mg into the skin once a week., Disp: 2 mL, Rfl: 1   DULoxetine (CYMBALTA) 60 MG capsule, Take 1 capsule (60 mg total) by mouth daily., Disp: 90 capsule, Rfl: 0   HYDROcodone-acetaminophen (NORCO) 10-325 MG tablet, Take 0.5 tablets by mouth every 6 (six) hours as needed., Disp: 15 tablet, Rfl: 0   ibuprofen (ADVIL) 800 MG tablet, Take 1 tablet (800 mg total) by mouth every 6 (six) hours as needed., Disp: 60 tablet, Rfl: 1   losartan-hydrochlorothiazide (HYZAAR) 50-12.5 MG tablet, TAKE 1 TABLET BY MOUTH DAILY, Disp: 90 tablet, Rfl: 1   Multiple Vitamin (MULTIVITAMIN WITH MINERALS) TABS tablet, Take 1 tablet by mouth daily., Disp: , Rfl:    oxyCODONE-acetaminophen (PERCOCET) 5-325 MG tablet, Take 1  tablet by mouth every 4 (four) hours as needed for severe pain., Disp: 30 tablet, Rfl: 0   pantoprazole (PROTONIX) 40 MG tablet, Take 1 tablet (40 mg total) by mouth daily., Disp: 90 tablet, Rfl: 1   pregabalin (LYRICA) 75 MG capsule, Take 1 capsule (75 mg total) by mouth 2 (two) times daily., Disp: 90 capsule, Rfl: 0   valACYclovir (VALTREX) 500 MG tablet, Take 1 tablet (500 mg total) by mouth 2 (two) times daily., Disp: 30 tablet, Rfl: 3   Vitamin D, Ergocalciferol, (DRISDOL) 1.25 MG (50000 UNIT) CAPS capsule, TAKE 1 CAPSULE BY MOUTH EVERY 7 DAYS, Disp: 12 capsule, Rfl: 0  Social History   Tobacco Use  Smoking Status Never  Smokeless Tobacco Never    Allergies  Allergen Reactions   Lisinopril Cough and Other (See Comments)    Other reaction(s): Unknown    Nsaids Other (See Comments)   Objective:  There were no vitals filed for this visit. There is no height or weight on file to calculate BMI. Constitutional Well developed. Well nourished.  Vascular Foot warm and well perfused. Capillary refill normal to all digits.   Neurologic Normal speech. Oriented to person, place, and time. Epicritic sensation to light touch grossly present bilaterally.  Dermatologic Reduction of left submetatarsal 5 pressure noted.  No pain on palpation.  Orthopedic: No further tenderness to palpation noted about  the surgical site.   Radiographs: 3 views discussedSkeletally mature on left foot: Osteotomy noted across the fifth metatarsal.  Good correction alignment noted reduction of deformity noted.  No other bony abnormalities noted. Assessment:   No diagnosis found.  Plan:  Patient was evaluated and treated and all questions answered.  S/p foot surgery left -Progressing as expected post-operatively. -XR: See above -WB Status: Weightbearing as tolerated in leg issues -Sutures: Removed no clinical signs of Deis is noted.  No complication noted. -Good reduction of pressure noted patient is  officially discharged from my care.  If any foot and ankle issues on please have asked her to come back and see me.  She states understanding  No follow-ups on file.

## 2022-08-20 NOTE — Assessment & Plan Note (Signed)
Positive point-of-care COVID in office.  We will have her start Paxlovid twice a day.  Discussed not to take Percocet with this.  Encourage fluids, rest.  Work note given.  Reviewed home care instructions for COVID. Advised self-isolation at home for at least 5 days. After 5 days, if improved and fever resolved, can be in public, but should wear a mask around others for an additional 5 days. If symptoms, esp, dyspnea develops/worsens, recommend in-person evaluation at either an urgent care or the emergency room.

## 2022-08-21 ENCOUNTER — Ambulatory Visit: Payer: Commercial Managed Care - HMO | Admitting: Specialist

## 2022-08-25 MED ORDER — BUPIVACAINE HCL 0.5 % IJ SOLN
3.0000 mL | INTRAMUSCULAR | Status: AC | PRN
  Administered 2022-06-19: 3 mL via INTRA_ARTICULAR

## 2022-08-25 MED ORDER — METHYLPREDNISOLONE ACETATE 40 MG/ML IJ SUSP
40.0000 mg | INTRAMUSCULAR | Status: AC | PRN
  Administered 2022-06-19: 40 mg via INTRA_ARTICULAR

## 2022-09-02 ENCOUNTER — Encounter (INDEPENDENT_AMBULATORY_CARE_PROVIDER_SITE_OTHER): Payer: Commercial Managed Care - HMO | Admitting: Internal Medicine

## 2022-09-02 DIAGNOSIS — Z0289 Encounter for other administrative examinations: Secondary | ICD-10-CM

## 2022-09-09 ENCOUNTER — Ambulatory Visit: Payer: Commercial Managed Care - HMO | Admitting: Podiatry

## 2022-09-09 ENCOUNTER — Encounter: Payer: Self-pay | Admitting: Podiatry

## 2022-09-09 DIAGNOSIS — M216X2 Other acquired deformities of left foot: Secondary | ICD-10-CM

## 2022-09-09 DIAGNOSIS — Z9889 Other specified postprocedural states: Secondary | ICD-10-CM

## 2022-09-09 NOTE — Progress Notes (Signed)
\  Subjective:  Patient ID: Kim Watson, female    DOB: 11-18-1971,  MRN: [id number]  Chief Complaint  Patient presents with   Post-op Problem   Foot Pain    "I am getting really bad cramps in my foot, I don't know if it's related to the surgery I had in August."    51 y.o. female presents with the above complaint.  Patient presents with concern of swelling to the surgical foot.  She does not know if it is related to the surgery but wanted get it evaluated.  She does not have any submetatarsal 5 pain.  Most of her swelling is around the top of her forefoot as well as the fourth and third toe.  She states that sometimes it causes her pain.  She wanted to get it evaluated.  She has not seen anyone else prior to seeing me denies any other acute complaints would like to discuss treatment options for this pain scale is 5 out of 10.   Review of Systems: Negative except as noted in the HPI. Denies N/V/F/Ch.  Past Medical History:  Diagnosis Date   Anxiety    Arthritis    Depression    Gallbladder problem    GERD (gastroesophageal reflux disease)    High blood pressure    Hypertension    Migraine    Obesity    Obstructive sleep apnea    Vitamin D deficiency     Current Outpatient Medications:    clonazePAM (KLONOPIN) 0.5 MG tablet, Take 1 tablet (0.5 mg total) by mouth 2 (two) times daily as needed for anxiety., Disp: 30 tablet, Rfl: 0   clotrimazole-betamethasone (LOTRISONE) cream, Apply 1 application. topically daily., Disp: 30 g, Rfl: 1   cyclobenzaprine (FLEXERIL) 10 MG tablet, Take 1 tablet (10 mg total) by mouth 3 (three) times daily as needed for muscle spasms., Disp: 30 tablet, Rfl: 0   Dulaglutide (TRULICITY) [id number] [id number] SOPN, Inject 0.75 mg into the skin once a week., Disp: 2 mL, Rfl: 1   DULoxetine (CYMBALTA) 60 MG capsule, Take 1 capsule (60 mg total) by mouth daily., Disp: 90 capsule, Rfl: 0   ibuprofen (ADVIL) 800 MG tablet, Take 1 tablet (800 mg total) by mouth every 6  (six) hours as needed., Disp: 60 tablet, Rfl: 1   losartan-hydrochlorothiazide (HYZAAR) 50-12.5 MG tablet, TAKE 1 TABLET BY MOUTH DAILY, Disp: 90 tablet, Rfl: 1   Multiple Vitamin (MULTIVITAMIN WITH MINERALS) TABS tablet, Take 1 tablet by mouth daily., Disp: , Rfl:    ondansetron (ZOFRAN) 4 MG tablet, Take 1 tablet (4 mg total) by mouth every 8 (eight) hours as needed for nausea or vomiting., Disp: 30 tablet, Rfl: 0   pantoprazole (PROTONIX) 40 MG tablet, Take 1 tablet (40 mg total) by mouth daily., Disp: 90 tablet, Rfl: 1   pregabalin (LYRICA) 75 MG capsule, Take 1 capsule (75 mg total) by mouth 2 (two) times daily., Disp: 90 capsule, Rfl: 0   valACYclovir (VALTREX) 500 MG tablet, Take 1 tablet (500 mg total) by mouth 2 (two) times daily., Disp: 30 tablet, Rfl: 3   Vitamin D, Ergocalciferol, (DRISDOL) 1.25 MG (50000 UNIT) CAPS capsule, TAKE 1 CAPSULE BY MOUTH EVERY 7 DAYS, Disp: 12 capsule, Rfl: 0   HYDROcodone-acetaminophen (NORCO) 10-325 MG tablet, Take 0.5 tablets by mouth every 6 (six) hours as needed. (Patient not taking: Reported on 08/20/2022), Disp: 15 tablet, Rfl: 0   oxyCODONE-acetaminophen (PERCOCET) 5-325 MG tablet, Take 1 tablet by mouth every 4 (  four) hours as needed for severe pain. (Patient not taking: Reported on 09/09/2022), Disp: 30 tablet, Rfl: 0  Social History   Tobacco Use  Smoking Status Never  Smokeless Tobacco Never    Allergies  Allergen Reactions   Lisinopril Cough and Other (See Comments)    Other reaction(s): Unknown    Nsaids Other (See Comments)   Objective:  There were no vitals filed for this visit. There is no height or weight on file to calculate BMI. Constitutional Well developed. Well nourished.  Vascular Dorsalis pedis pulses palpable bilaterally. Posterior tibial pulses palpable bilaterally. Capillary refill normal to all digits.  No cyanosis or clubbing noted. Pedal hair growth normal.  Neurologic Normal speech. Oriented to person, place,  and time. Epicritic sensation to light touch grossly present bilaterally.  Dermatologic Left plantar fifth porokeratosis noted.  Very mild in palpation and pain.  Reduction of plantarflexed metatarsal noted.  Orthopedic: Normal joint ROM without pain or crepitus bilaterally. No visible deformities. No bony tenderness.   Radiographs: None Assessment:   1. Plantar flexed metatarsal, left   2. S/P foot surgery    Plan:  Patient was evaluated and treated and all questions answered.  Status post left fifth plantarflexed metatarsal with swelling -All questions and concerns were discussed with the patient in extensive detail -I discussed with her the etiology of swelling and various treatment options were discussed.  I discussed with her that this is very common for this follow-up 2 months after the surgery.  It can take up to a year to completely go away.  She states understanding discussed encourage her to wear compression socks and elevate her foot.  She states understand will do so immediately  No follow-ups on file.

## 2022-09-10 ENCOUNTER — Telehealth: Payer: Self-pay | Admitting: Nurse Practitioner

## 2022-09-10 NOTE — Telephone Encounter (Signed)
Caller Name: Tamorah Hada Call back phone #: [phone]  Reason for Call: Pt dropped off forms to be filled out by Lauren. This is clearance for surgery, please fax completed forms to [phone]

## 2022-09-11 ENCOUNTER — Encounter: Payer: Self-pay | Admitting: Nurse Practitioner

## 2022-09-11 NOTE — Telephone Encounter (Signed)
Appt scheduled for pre op exam

## 2022-09-15 ENCOUNTER — Ambulatory Visit (HOSPITAL_COMMUNITY): Payer: Self-pay | Admitting: Orthopedic Surgery

## 2022-09-15 ENCOUNTER — Encounter: Payer: Self-pay | Admitting: Nurse Practitioner

## 2022-09-15 ENCOUNTER — Ambulatory Visit (INDEPENDENT_AMBULATORY_CARE_PROVIDER_SITE_OTHER): Payer: Commercial Managed Care - HMO | Admitting: Nurse Practitioner

## 2022-09-15 VITALS — BP 110/90 | HR 76 | Temp 97.0°F | Wt 275.4 lb

## 2022-09-15 DIAGNOSIS — Z01818 Encounter for other preprocedural examination: Secondary | ICD-10-CM | POA: Diagnosis not present

## 2022-09-15 MED ORDER — TRULICITY 0.75 MG/0.5ML ~~LOC~~ SOAJ: 0.7500 mg | SUBCUTANEOUS | 1 refills | Status: DC

## 2022-09-15 MED ORDER — CLONAZEPAM 0.5 MG PO TBDP: 0.5000 mg | ORAL_TABLET | Freq: Two times a day (BID) | ORAL | 0 refills | Status: DC

## 2022-09-15 NOTE — Patient Instructions (Signed)
It was great to see you!  I have refilled the klonopin as needed for anxiety.   Stop your trulicity 7 days before the surgery.   Let's follow-up in 3 months, sooner if you have concerns.  If a referral was placed today, you will be contacted for an appointment. Please note that routine referrals can sometimes take up to 3-4 weeks to process. Please call our office if you haven't heard anything after this time frame.  Take care,  Vance Peper, NP

## 2022-09-15 NOTE — Progress Notes (Unsigned)
   Established Patient Office Visit  Subjective   Patient ID: Kim Watson, female    DOB: [birth year]  Age: 51 y.o. MRN: [id number]  No chief complaint on file.   HPI  Kim Watson is here for pre-operative clearance. She is scheduled for anterior cervical disc decompression/discectomy on 10/02/22.   {History (Optional):23778}  ROS    Objective:     BP (!) 110/90 (BP Location: Left Arm, Patient Position: Sitting, Cuff Size: Large)   Pulse 76   Temp (!) 97 F (36.1 C) (Temporal)   Wt 275 lb 6.4 oz (124.9 kg)   SpO2 97%   BMI 44.45 kg/m  BP Readings from Last 3 Encounters:  09/15/22 (!) 110/90  08/20/22 132/88  07/23/22 117/79   Wt Readings from Last 3 Encounters:  09/15/22 275 lb 6.4 oz (124.9 kg)  08/20/22 279 lb 6.4 oz (126.7 kg)  07/23/22 282 lb (127.9 kg)      Physical Exam   No results found for any visits on 09/15/22.  {Labs (Optional):23779}  The 10-year ASCVD risk score (Arnett DK, et al., 2019) is: 1.6%    Assessment & Plan:   Problem List Items Addressed This Visit   None Visit Diagnoses     Preop examination    -  Primary   Relevant Orders   CBC   Comprehensive metabolic panel   EKG 16-WVPX (Completed)       No follow-ups on file.    Charyl Dancer, NP

## 2022-09-16 ENCOUNTER — Encounter: Payer: Self-pay | Admitting: Nurse Practitioner

## 2022-09-16 DIAGNOSIS — Z01818 Encounter for other preprocedural examination: Secondary | ICD-10-CM | POA: Insufficient documentation

## 2022-09-16 LAB — CBC
HCT: 38.4 % (ref 36.0–46.0)
Hemoglobin: 12.7 g/dL (ref 12.0–15.0)
MCHC: 33.1 g/dL (ref 30.0–36.0)
MCV: 87.3 fl (ref 78.0–100.0)
Platelets: 247 10*3/uL (ref 150.0–400.0)
RBC: 4.4 Mil/uL (ref 3.87–5.11)
RDW: 14.3 % (ref 11.5–15.5)
WBC: 6 10*3/uL (ref 4.0–10.5)

## 2022-09-16 LAB — COMPREHENSIVE METABOLIC PANEL
ALT: 45 U/L — ABNORMAL HIGH (ref 0–35)
AST: 30 U/L (ref 0–37)
Albumin: 4.1 g/dL (ref 3.5–5.2)
Alkaline Phosphatase: 62 U/L (ref 39–117)
BUN: 11 mg/dL (ref 6–23)
CO2: 27 mEq/L (ref 19–32)
Calcium: 9.3 mg/dL (ref 8.4–10.5)
Chloride: 103 mEq/L (ref 96–112)
Creatinine, Ser: 0.76 mg/dL (ref 0.40–1.20)
GFR: 90.94 mL/min (ref >60.00)
Glucose, Bld: 88 mg/dL (ref 70–99)
Potassium: 3.7 mEq/L (ref 3.5–5.1)
Sodium: 139 mEq/L (ref 135–145)
Total Bilirubin: 0.5 mg/dL (ref 0.2–1.2)
Total Protein: 6.9 g/dL (ref 6.0–8.3)

## 2022-09-16 NOTE — Assessment & Plan Note (Signed)
She is scheduled for cervical disc decompression/discectomy on 10/02/22. EKG done today shows normal sinus rhythm with a heart rate of 69, no ST or T wave changes. Will check CMP, CBC. Recommend that she hold her trulicity at least 7 days prior to surgery. She is clear for surgery and is low-moderate risk.

## 2022-09-16 NOTE — Progress Notes (Signed)
EKG interpreted by me on 09/15/22 showed normal sinus rhythm with a heart rate of 69. No ST or T wave changes.

## 2022-09-19 ENCOUNTER — Ambulatory Visit: Payer: Commercial Managed Care - HMO | Admitting: Podiatry

## 2022-09-22 NOTE — Pre-Procedure Instructions (Signed)
Surgical Instructions    Your procedure is scheduled on Thursday, November 2nd.  Report to [facility] Main Entrance "A" at 05:30 A.M., then check in with the Admitting office.  Call this number if you have problems the morning of surgery:  [phone]   If you have any questions prior to your surgery date call [phone]: Open Monday-Friday 8am-4pm    Remember:  Do not eat or drink after midnight the night before your surgery     Take these medicines the morning of surgery with A SIP OF WATER  clonazePAM (KLONOPIN)  DULoxetine (CYMBALTA)  pantoprazole (PROTONIX)  valACYclovir (VALTREX)- if needed  As of today, STOP taking any Aspirin (unless otherwise instructed by your surgeon) Aleve, Naproxen, Ibuprofen, Motrin, Advil, Goody's, BC's, all herbal medications, fish oil, and all vitamins.  DO NOT TAKE  Dulaglutide (TRULICITY)  FOR 7 DAYS PRIOR TO SURGERY. Last dose 10/23.               Do NOT Smoke (Tobacco/Vaping) for 24 hours prior to your procedure.  If you use a CPAP at night, you may bring your mask/headgear for your overnight stay.   Contacts, glasses, piercing's, hearing aid's, dentures or partials may not be worn into surgery, please bring cases for these belongings.    For patients admitted to the hospital, discharge time will be determined by your treatment team.   Patients discharged the day of surgery will not be allowed to drive home, and someone needs to stay with them for 24 hours.  SURGICAL WAITING ROOM VISITATION Patients having surgery or a procedure may have no more than 2 support people in the waiting area - these visitors may rotate.   Children under the age of 55 must have an adult with them who is not the patient. If the patient needs to stay at the hospital during part of their recovery, the visitor guidelines for inpatient rooms apply. Pre-op nurse will coordinate an appropriate time for 1 support person to accompany patient in pre-op.  This support  person may not rotate.   Please refer to the [facility] website for the visitor guidelines for Inpatients (after your surgery is over and you are in a regular room).    Special instructions:   [facility]- Preparing For Surgery  Before surgery, you can play an important role. Because skin is not sterile, your skin needs to be as free of germs as possible. You can reduce the number of germs on your skin by washing with CHG (chlorahexidine gluconate) Soap before surgery.  CHG is an antiseptic cleaner which kills germs and bonds with the skin to continue killing germs even after washing.    Oral Hygiene is also important to reduce your risk of infection.  Remember - BRUSH YOUR TEETH THE MORNING OF SURGERY WITH YOUR REGULAR TOOTHPASTE  Please do not use if you have an allergy to CHG or antibacterial soaps. If your skin becomes reddened/irritated stop using the CHG.  Do not shave (including legs and underarms) for at least 48 hours prior to first CHG shower. It is OK to shave your face.  Please follow these instructions carefully.   Shower the NIGHT BEFORE SURGERY and the MORNING OF SURGERY  If you chose to wash your hair, wash your hair first as usual with your normal shampoo.  After you shampoo, rinse your hair and body thoroughly to remove the shampoo.  Use CHG Soap as you would any other liquid soap. You can apply CHG directly  to the skin and wash gently with a scrungie or a clean washcloth.   Apply the CHG Soap to your body ONLY FROM THE NECK DOWN.  Do not use on open wounds or open sores. Avoid contact with your eyes, ears, mouth and genitals (private parts). Wash Face and genitals (private parts)  with your normal soap.   Wash thoroughly, paying special attention to the area where your surgery will be performed.  Thoroughly rinse your body with warm water from the neck down.  DO NOT shower/wash with your normal soap after using and rinsing off the CHG Soap.  Pat yourself dry with a  CLEAN TOWEL.  Wear CLEAN PAJAMAS to bed the night before surgery  Place CLEAN SHEETS on your bed the night before your surgery  DO NOT SLEEP WITH PETS.   Day of Surgery: Take a shower with CHG soap. Do not wear jewelry or makeup Do not wear lotions, powders, perfumes, or deodorant. Do not shave 48 hours prior to surgery.  Do not bring valuables to the hospital. [facility] is not responsible for any belongings or valuables. Do not wear nail polish, gel polish, artificial nails, or any other type of covering on natural nails (fingers and toes) If you have artificial nails or gel coating that need to be removed by a nail salon, please have this removed prior to surgery. Artificial nails or gel coating may interfere with anesthesia's ability to adequately monitor your vital signs. Wear Clean/Comfortable clothing the morning of surgery Remember to brush your teeth WITH YOUR REGULAR TOOTHPASTE.   Please read over the following fact sheets that you were given.    If you received a COVID test during your pre-op visit  it is requested that you wear a mask when out in public, stay away from anyone that may not be feeling well and notify your surgeon if you develop symptoms. If you have been in contact with anyone that has tested positive in the last 10 days please notify you surgeon.

## 2022-09-23 ENCOUNTER — Encounter (HOSPITAL_COMMUNITY): Payer: Self-pay

## 2022-09-23 ENCOUNTER — Encounter (HOSPITAL_COMMUNITY)
Admission: RE | Admit: 2022-09-23 | Discharge: 2022-09-23 | Disposition: A | Discharge status: Home / Self Care | Payer: Commercial Managed Care - HMO | Source: Ambulatory Visit | Attending: Orthopedic Surgery | Admitting: Orthopedic Surgery

## 2022-09-23 ENCOUNTER — Other Ambulatory Visit: Payer: Self-pay

## 2022-09-23 VITALS — BP 144/90 | HR 74 | Temp 98.1°F | Resp 18 | Ht 66.0 in | Wt 274.9 lb

## 2022-09-23 DIAGNOSIS — Z01812 Encounter for preprocedural laboratory examination: Secondary | ICD-10-CM | POA: Insufficient documentation

## 2022-09-23 DIAGNOSIS — Z01818 Encounter for other preprocedural examination: Secondary | ICD-10-CM

## 2022-09-23 DIAGNOSIS — I10 Essential (primary) hypertension: Secondary | ICD-10-CM | POA: Insufficient documentation

## 2022-09-23 LAB — TYPE AND SCREEN
ABO/RH(D): A POS
Antibody Screen: NEGATIVE

## 2022-09-23 LAB — SURGICAL PCR SCREEN
MRSA, PCR: NEGATIVE
Staphylococcus aureus: NEGATIVE

## 2022-09-23 NOTE — Progress Notes (Signed)
PCP - Vance Peper NP At [facility] Cardiologist - Denies  PPM/ICD - Denies Device Orders -  Rep Notified -   Chest x-ray - NI EKG - 09/15/22 Stress Test - Denies ECHO - Denies Cardiac Cath - Denies  Sleep Study - Years ago 2011 or 2012 diagnosed with OSA then Gastric bypass in 2015 released from CPAP use in 2016    DM - Denies  Last dose of GLP1 agonist- Trulicity last dose 07/86/75   GLP1 instructions: Per patient PCP instructed to hold off until after surgery  Blood Thinner Instructions:Denies Aspirin Instructions:Denies  COVID TEST- NI   Anesthesia review: No  Patient denies shortness of breath, fever, cough and chest pain at PAT appointment   All instructions explained to the patient, with a verbal understanding of the material. Patient agrees to go over the instructions while at home for a better understanding.  The opportunity to ask questions was provided.

## 2022-09-29 ENCOUNTER — Other Ambulatory Visit: Payer: Self-pay | Admitting: Nurse Practitioner

## 2022-10-01 ENCOUNTER — Ambulatory Visit: Payer: Commercial Managed Care - HMO | Admitting: Podiatry

## 2022-10-02 ENCOUNTER — Ambulatory Visit (HOSPITAL_BASED_OUTPATIENT_CLINIC_OR_DEPARTMENT_OTHER): Payer: Commercial Managed Care - HMO | Admitting: Certified Registered Nurse Anesthetist

## 2022-10-02 ENCOUNTER — Observation Stay (HOSPITAL_COMMUNITY)
Admission: RE | Admit: 2022-10-02 | Discharge: 2022-10-03 | Disposition: A | Discharge status: Home / Self Care | Payer: Commercial Managed Care - HMO | Attending: Orthopedic Surgery | Admitting: Orthopedic Surgery

## 2022-10-02 ENCOUNTER — Other Ambulatory Visit: Payer: Self-pay

## 2022-10-02 ENCOUNTER — Ambulatory Visit (HOSPITAL_COMMUNITY): Payer: Commercial Managed Care - HMO | Admitting: Certified Registered Nurse Anesthetist

## 2022-10-02 ENCOUNTER — Ambulatory Visit (HOSPITAL_COMMUNITY): Payer: Commercial Managed Care - HMO

## 2022-10-02 ENCOUNTER — Encounter (HOSPITAL_COMMUNITY)
Admission: RE | Disposition: A | Discharge status: Home / Self Care | Payer: Self-pay | Source: Home / Self Care | Attending: Orthopedic Surgery

## 2022-10-02 ENCOUNTER — Encounter (HOSPITAL_COMMUNITY): Payer: Self-pay | Admitting: Orthopedic Surgery

## 2022-10-02 DIAGNOSIS — Z79899 Other long term (current) drug therapy: Secondary | ICD-10-CM | POA: Insufficient documentation

## 2022-10-02 DIAGNOSIS — M4712 Other spondylosis with myelopathy, cervical region: Secondary | ICD-10-CM

## 2022-10-02 DIAGNOSIS — I10 Essential (primary) hypertension: Secondary | ICD-10-CM | POA: Diagnosis not present

## 2022-10-02 DIAGNOSIS — M4722 Other spondylosis with radiculopathy, cervical region: Secondary | ICD-10-CM

## 2022-10-02 DIAGNOSIS — G959 Disease of spinal cord, unspecified: Principal | ICD-10-CM | POA: Diagnosis present

## 2022-10-02 HISTORY — PX: ANTERIOR CERVICAL DECOMP/DISCECTOMY FUSION: SHX1161

## 2022-10-02 LAB — ABO/RH: ABO/RH(D): A POS

## 2022-10-02 SURGERY — ANTERIOR CERVICAL DECOMPRESSION/DISCECTOMY FUSION 3 LEVELS
Anesthesia: General | Site: Spine Cervical

## 2022-10-02 MED ORDER — DEXAMETHASONE SODIUM PHOSPHATE 4 MG/ML IJ SOLN
4.0000 mg | Freq: Four times a day (QID) | INTRAMUSCULAR | Status: DC
  Administered 2022-10-02: 4 mg via INTRAVENOUS
  Filled 2022-10-02: qty 1

## 2022-10-02 MED ORDER — DULAGLUTIDE 0.75 MG/0.5ML ~~LOC~~ SOAJ: 0.7500 mg | SUBCUTANEOUS | Status: DC

## 2022-10-02 MED ORDER — PROPOFOL 1000 MG/100ML IV EMUL
INTRAVENOUS | Status: AC
  Filled 2022-10-02: qty 100

## 2022-10-02 MED ORDER — PANTOPRAZOLE SODIUM 40 MG PO TBEC
40.0000 mg | DELAYED_RELEASE_TABLET | Freq: Every day | ORAL | Status: DC
  Administered 2022-10-03: 40 mg via ORAL
  Filled 2022-10-02: qty 1

## 2022-10-02 MED ORDER — PHENYLEPHRINE HCL-NACL 20-0.9 MG/250ML-% IV SOLN: INTRAVENOUS | Status: DC | PRN

## 2022-10-02 MED ORDER — PROPOFOL 500 MG/50ML IV EMUL
INTRAVENOUS | Status: DC | PRN
  Administered 2022-10-02 (×2): 125 ug/kg/min via INTRAVENOUS

## 2022-10-02 MED ORDER — METHOCARBAMOL 1000 MG/10ML IJ SOLN: 500.0000 mg | Freq: Four times a day (QID) | INTRAVENOUS | Status: DC | PRN

## 2022-10-02 MED ORDER — POLYETHYLENE GLYCOL 3350 17 G PO PACK: 17.0000 g | PACK | Freq: Every day | ORAL | Status: DC | PRN

## 2022-10-02 MED ORDER — LIDOCAINE 2% (20 MG/ML) 5 ML SYRINGE
INTRAMUSCULAR | Status: DC | PRN
  Administered 2022-10-02: 100 mg via INTRAVENOUS

## 2022-10-02 MED ORDER — SODIUM CHLORIDE 0.9% FLUSH: 3.0000 mL | INTRAVENOUS | Status: DC | PRN

## 2022-10-02 MED ORDER — METHOCARBAMOL 500 MG PO TABS: 500.0000 mg | ORAL_TABLET | Freq: Three times a day (TID) | ORAL | 0 refills | Status: AC | PRN

## 2022-10-02 MED ORDER — THROMBIN 20000 UNITS EX SOLR
CUTANEOUS | Status: AC
  Filled 2022-10-02: qty 20000

## 2022-10-02 MED ORDER — SUCCINYLCHOLINE CHLORIDE 200 MG/10ML IV SOSY
PREFILLED_SYRINGE | INTRAVENOUS | Status: DC | PRN
  Administered 2022-10-02: 160 mg via INTRAVENOUS

## 2022-10-02 MED ORDER — BUPIVACAINE-EPINEPHRINE (PF) 0.25% -1:200000 IJ SOLN
INTRAMUSCULAR | Status: AC
  Filled 2022-10-02: qty 30

## 2022-10-02 MED ORDER — HYDROMORPHONE HCL 1 MG/ML IJ SOLN: 0.2500 mg | INTRAMUSCULAR | Status: DC | PRN

## 2022-10-02 MED ORDER — MAGNESIUM CITRATE PO SOLN: 1.0000 | Freq: Once | ORAL | Status: DC | PRN

## 2022-10-02 MED ORDER — ACETAMINOPHEN 325 MG PO TABS
650.0000 mg | ORAL_TABLET | ORAL | Status: DC | PRN
  Administered 2022-10-03: 650 mg via ORAL
  Filled 2022-10-02: qty 2

## 2022-10-02 MED ORDER — SODIUM CHLORIDE 0.9% FLUSH
3.0000 mL | Freq: Two times a day (BID) | INTRAVENOUS | Status: DC
  Administered 2022-10-02: 3 mL via INTRAVENOUS

## 2022-10-02 MED ORDER — ONDANSETRON HCL 4 MG/2ML IJ SOLN: 4.0000 mg | Freq: Once | INTRAMUSCULAR | Status: DC | PRN

## 2022-10-02 MED ORDER — OXYCODONE HCL 5 MG/5ML PO SOLN: 5.0000 mg | Freq: Once | ORAL | Status: DC | PRN

## 2022-10-02 MED ORDER — MIDAZOLAM HCL 2 MG/2ML IJ SOLN
INTRAMUSCULAR | Status: AC
  Filled 2022-10-02: qty 2

## 2022-10-02 MED ORDER — OXYCODONE HCL 5 MG PO TABS: 5.0000 mg | ORAL_TABLET | ORAL | Status: DC | PRN

## 2022-10-02 MED ORDER — SODIUM CHLORIDE 0.9 % IV SOLN: INTRAVENOUS | Status: DC

## 2022-10-02 MED ORDER — DEXAMETHASONE 4 MG PO TABS
4.0000 mg | ORAL_TABLET | Freq: Four times a day (QID) | ORAL | Status: DC
  Administered 2022-10-02 – 2022-10-03 (×2): 4 mg via ORAL
  Filled 2022-10-02 (×2): qty 1

## 2022-10-02 MED ORDER — FENTANYL CITRATE (PF) 250 MCG/5ML IJ SOLN
INTRAMUSCULAR | Status: AC
  Filled 2022-10-02: qty 5

## 2022-10-02 MED ORDER — CEFAZOLIN IN SODIUM CHLORIDE 3-0.9 GM/100ML-% IV SOLN
3.0000 g | INTRAVENOUS | Status: AC
  Administered 2022-10-02 (×2): 3 g via INTRAVENOUS
  Filled 2022-10-02: qty 100

## 2022-10-02 MED ORDER — ACETAMINOPHEN 10 MG/ML IV SOLN
INTRAVENOUS | Status: DC | PRN
  Administered 2022-10-02: 1000 mg via INTRAVENOUS

## 2022-10-02 MED ORDER — ORAL CARE MOUTH RINSE: 15.0000 mL | Freq: Once | OROMUCOSAL | Status: AC

## 2022-10-02 MED ORDER — PROPOFOL 10 MG/ML IV BOLUS
INTRAVENOUS | Status: DC | PRN
  Administered 2022-10-02: 150 mg via INTRAVENOUS
  Administered 2022-10-02: 50 mg via INTRAVENOUS

## 2022-10-02 MED ORDER — OXYCODONE HCL 5 MG PO TABS
10.0000 mg | ORAL_TABLET | ORAL | Status: DC | PRN
  Administered 2022-10-02 – 2022-10-03 (×5): 10 mg via ORAL
  Filled 2022-10-02 (×5): qty 2

## 2022-10-02 MED ORDER — PHENYLEPHRINE HCL-NACL 20-0.9 MG/250ML-% IV SOLN
INTRAVENOUS | Status: DC | PRN
  Administered 2022-10-02: 20 ug/min via INTRAVENOUS

## 2022-10-02 MED ORDER — ACETAMINOPHEN 650 MG RE SUPP: 650.0000 mg | RECTAL | Status: DC | PRN

## 2022-10-02 MED ORDER — HYDROCHLOROTHIAZIDE 12.5 MG PO TABS
12.5000 mg | ORAL_TABLET | Freq: Every day | ORAL | Status: DC
  Administered 2022-10-02 – 2022-10-03 (×2): 12.5 mg via ORAL
  Filled 2022-10-02 (×2): qty 1

## 2022-10-02 MED ORDER — EPHEDRINE SULFATE (PRESSORS) 50 MG/ML IJ SOLN
INTRAMUSCULAR | Status: DC | PRN
  Administered 2022-10-02: 10 mg via INTRAVENOUS

## 2022-10-02 MED ORDER — OXYCODONE HCL 5 MG PO TABS: 5.0000 mg | ORAL_TABLET | Freq: Once | ORAL | Status: DC | PRN

## 2022-10-02 MED ORDER — CLONAZEPAM 0.25 MG PO TBDP
0.5000 mg | ORAL_TABLET | Freq: Two times a day (BID) | ORAL | Status: DC
  Administered 2022-10-02 – 2022-10-03 (×2): 0.5 mg via ORAL
  Filled 2022-10-02 (×2): qty 2

## 2022-10-02 MED ORDER — ONDANSETRON HCL 4 MG/2ML IJ SOLN: 4.0000 mg | Freq: Four times a day (QID) | INTRAMUSCULAR | Status: DC | PRN

## 2022-10-02 MED ORDER — 0.9 % SODIUM CHLORIDE (POUR BTL) OPTIME
TOPICAL | Status: DC | PRN
  Administered 2022-10-02 (×2): 1000 mL

## 2022-10-02 MED ORDER — METHOCARBAMOL 500 MG PO TABS
500.0000 mg | ORAL_TABLET | Freq: Four times a day (QID) | ORAL | Status: DC | PRN
  Administered 2022-10-02 – 2022-10-03 (×2): 500 mg via ORAL
  Filled 2022-10-02 (×2): qty 1

## 2022-10-02 MED ORDER — PROPOFOL 1000 MG/100ML IV EMUL
INTRAVENOUS | Status: AC
  Filled 2022-10-02: qty 400

## 2022-10-02 MED ORDER — FENTANYL CITRATE (PF) 250 MCG/5ML IJ SOLN
INTRAMUSCULAR | Status: DC | PRN
  Administered 2022-10-02: 100 ug via INTRAVENOUS
  Administered 2022-10-02 (×2): 50 ug via INTRAVENOUS
  Administered 2022-10-02: 100 ug via INTRAVENOUS
  Administered 2022-10-02 (×6): 50 ug via INTRAVENOUS

## 2022-10-02 MED ORDER — LACTATED RINGERS IV SOLN: INTRAVENOUS | Status: DC

## 2022-10-02 MED ORDER — DULOXETINE HCL 30 MG PO CPEP
60.0000 mg | ORAL_CAPSULE | Freq: Every day | ORAL | Status: DC
  Administered 2022-10-03: 60 mg via ORAL
  Filled 2022-10-02: qty 2

## 2022-10-02 MED ORDER — OXYCODONE-ACETAMINOPHEN 10-325 MG PO TABS: 1.0000 | ORAL_TABLET | Freq: Four times a day (QID) | ORAL | 0 refills | Status: AC | PRN

## 2022-10-02 MED ORDER — ACETAMINOPHEN 10 MG/ML IV SOLN
INTRAVENOUS | Status: AC
  Filled 2022-10-02: qty 100

## 2022-10-02 MED ORDER — LOSARTAN POTASSIUM-HCTZ 50-12.5 MG PO TABS: 1.0000 | ORAL_TABLET | Freq: Every day | ORAL | Status: DC

## 2022-10-02 MED ORDER — TRANEXAMIC ACID-NACL 1000-0.7 MG/100ML-% IV SOLN
1000.0000 mg | INTRAVENOUS | Status: AC
  Administered 2022-10-02: 1000 mg via INTRAVENOUS
  Filled 2022-10-02: qty 100

## 2022-10-02 MED ORDER — BUPIVACAINE-EPINEPHRINE 0.25% -1:200000 IJ SOLN
INTRAMUSCULAR | Status: DC | PRN
  Administered 2022-10-02: 10 mL

## 2022-10-02 MED ORDER — THROMBIN 20000 UNITS EX SOLR
CUTANEOUS | Status: DC | PRN
  Administered 2022-10-02: 20 mL via TOPICAL

## 2022-10-02 MED ORDER — ONDANSETRON HCL 4 MG PO TABS: 4.0000 mg | ORAL_TABLET | Freq: Four times a day (QID) | ORAL | Status: DC | PRN

## 2022-10-02 MED ORDER — ONDANSETRON HCL 4 MG PO TABS: 4.0000 mg | ORAL_TABLET | Freq: Three times a day (TID) | ORAL | 0 refills | Status: DC | PRN

## 2022-10-02 MED ORDER — ONDANSETRON HCL 4 MG/2ML IJ SOLN
INTRAMUSCULAR | Status: DC | PRN
  Administered 2022-10-02 (×2): 4 mg via INTRAVENOUS

## 2022-10-02 MED ORDER — SURGIFLO WITH THROMBIN (HEMOSTATIC MATRIX KIT) OPTIME
TOPICAL | Status: DC | PRN
  Administered 2022-10-02 (×2): 1 via TOPICAL

## 2022-10-02 MED ORDER — HYDROMORPHONE HCL 1 MG/ML IJ SOLN
1.0000 mg | INTRAMUSCULAR | Status: DC | PRN
  Administered 2022-10-02: 1 mg via INTRAVENOUS
  Filled 2022-10-02: qty 1

## 2022-10-02 MED ORDER — PHENOL 1.4 % MT LIQD: 1.0000 | OROMUCOSAL | Status: DC | PRN

## 2022-10-02 MED ORDER — CEFAZOLIN SODIUM-DEXTROSE 1-4 GM/50ML-% IV SOLN
1.0000 g | Freq: Three times a day (TID) | INTRAVENOUS | Status: AC
  Administered 2022-10-02 – 2022-10-03 (×2): 1 g via INTRAVENOUS
  Filled 2022-10-02 (×2): qty 50

## 2022-10-02 MED ORDER — CHLORHEXIDINE GLUCONATE 0.12 % MT SOLN
15.0000 mL | Freq: Once | OROMUCOSAL | Status: AC
  Administered 2022-10-02: 15 mL via OROMUCOSAL
  Filled 2022-10-02: qty 15

## 2022-10-02 MED ORDER — MENTHOL 3 MG MT LOZG: 1.0000 | LOZENGE | OROMUCOSAL | Status: DC | PRN

## 2022-10-02 MED ORDER — MIDAZOLAM HCL 2 MG/2ML IJ SOLN
INTRAMUSCULAR | Status: DC | PRN
  Administered 2022-10-02: 2 mg via INTRAVENOUS

## 2022-10-02 MED ORDER — LOSARTAN POTASSIUM 50 MG PO TABS
50.0000 mg | ORAL_TABLET | Freq: Every day | ORAL | Status: DC
  Administered 2022-10-02 – 2022-10-03 (×2): 50 mg via ORAL
  Filled 2022-10-02 (×2): qty 1

## 2022-10-02 SURGICAL SUPPLY — 70 items
BAG COUNTER SPONGE SURGICOUNT (BAG) IMPLANT
BLADE CLIPPER SURG (BLADE) IMPLANT
BUR EGG ELITE 4.0 (BURR) IMPLANT
BUR MATCHSTICK NEURO 3.0 LAGG (BURR) IMPLANT
CABLE BIPOLOR RESECTION CORD (MISCELLANEOUS) ×1 IMPLANT
CAGE LORDOTIC 6 SM (Cage) IMPLANT
CANISTER SUCT 3000ML PPV (MISCELLANEOUS) ×1 IMPLANT
CLSR STERI-STRIP ANTIMIC 1/2X4 (GAUZE/BANDAGES/DRESSINGS) ×1 IMPLANT
COVER MAYO STAND STRL (DRAPES) ×3 IMPLANT
COVER SURGICAL LIGHT HANDLE (MISCELLANEOUS) ×2 IMPLANT
DEVICE ENDSKLTN IMPLANT SM 7MM (Cage) IMPLANT
DRAIN CHANNEL 15F RND FF W/TCR (WOUND CARE) IMPLANT
DRAPE C-ARM 42X72 X-RAY (DRAPES) ×1 IMPLANT
DRAPE POUCH INSTRU U-SHP 10X18 (DRAPES) ×1 IMPLANT
DRAPE SURG 17X23 STRL (DRAPES) ×1 IMPLANT
DRAPE U-SHAPE 47X51 STRL (DRAPES) ×1 IMPLANT
DRSG OPSITE POSTOP 4X6 (GAUZE/BANDAGES/DRESSINGS) ×1 IMPLANT
DURAPREP 26ML APPLICATOR (WOUND CARE) ×1 IMPLANT
ELECT COATED BLADE 2.86 ST (ELECTRODE) ×1 IMPLANT
ELECT NVM5 SURFACE MEP/EMG (ELECTRODE) IMPLANT
ELECT PENCIL ROCKER SW 15FT (MISCELLANEOUS) ×1 IMPLANT
ELECT REM PT RETURN 9FT ADLT (ELECTROSURGICAL) ×1
ELECTRODE REM PT RTRN 9FT ADLT (ELECTROSURGICAL) ×1 IMPLANT
ENDOSKELETON IMPLANT SM 7MM (Cage) ×1 IMPLANT
GLOVE BIO SURGEON STRL SZ 6.5 (GLOVE) ×1 IMPLANT
GLOVE BIOGEL PI IND STRL 6.5 (GLOVE) ×1 IMPLANT
GLOVE BIOGEL PI IND STRL 8.5 (GLOVE) ×1 IMPLANT
GLOVE SS BIOGEL STRL SZ 8.5 (GLOVE) ×1 IMPLANT
GOWN STRL REUS W/ TWL LRG LVL3 (GOWN DISPOSABLE) ×2 IMPLANT
GOWN STRL REUS W/TWL 2XL LVL3 (GOWN DISPOSABLE) ×2 IMPLANT
GOWN STRL REUS W/TWL LRG LVL3 (GOWN DISPOSABLE) ×1
KIT BASIN OR (CUSTOM PROCEDURE TRAY) ×1 IMPLANT
KIT TURNOVER KIT B (KITS) ×1 IMPLANT
MODULE EMG NDL SSEP NVM5 (NEEDLE) IMPLANT
MODULE EMG NEEDLE SSEP NVM5 (NEEDLE) ×1 IMPLANT
NDL SPNL 18GX3.5 QUINCKE PK (NEEDLE) ×1 IMPLANT
NEEDLE HYPO 22GX1.5 SAFETY (NEEDLE) ×1 IMPLANT
NEEDLE SPNL 18GX3.5 QUINCKE PK (NEEDLE) ×1 IMPLANT
NS IRRIG 1000ML POUR BTL (IV SOLUTION) ×1 IMPLANT
PACK ORTHO CERVICAL (CUSTOM PROCEDURE TRAY) ×1 IMPLANT
PACK UNIVERSAL I (CUSTOM PROCEDURE TRAY) ×1 IMPLANT
PAD ARMBOARD 7.5X6 YLW CONV (MISCELLANEOUS) ×3 IMPLANT
PATTIES SURGICAL .25X.25 (GAUZE/BANDAGES/DRESSINGS) ×1 IMPLANT
PATTIES SURGICAL .5 X.5 (GAUZE/BANDAGES/DRESSINGS) IMPLANT
PIN ACP TEMP FIXATION (EXFIX) IMPLANT
PIN DISTRACTION MAXCESS-C 14 (PIN) IMPLANT
PLATE ACP 1.9X58 3LVL (Plate) IMPLANT
POSITIONER HEAD DONUT 9IN (MISCELLANEOUS) ×1 IMPLANT
PUTTY BONE DBX 2.5 MIS (Bone Implant) IMPLANT
RESTRAINT LIMB HOLDER UNIV (RESTRAINTS) ×1 IMPLANT
SCREW ACP VA SD 3.5X15 (Screw) IMPLANT
SPONGE INTESTINAL PEANUT (DISPOSABLE) ×2 IMPLANT
SPONGE SURGIFOAM ABS GEL 100 (HEMOSTASIS) ×1 IMPLANT
SPONGE T-LAP 4X18 ~~LOC~~+RFID (SPONGE) ×2 IMPLANT
SURGIFLO W/THROMBIN 8M KIT (HEMOSTASIS) IMPLANT
SUT BONE WAX W31G (SUTURE) ×1 IMPLANT
SUT MNCRL AB 3-0 PS2 27 (SUTURE) ×1 IMPLANT
SUT SILK 2 0 (SUTURE) ×1
SUT SILK 2-0 18XBRD TIE 12 (SUTURE) ×1 IMPLANT
SUT VIC AB 2-0 CT1 18 (SUTURE) ×1 IMPLANT
SYR BULB IRRIG 60ML STRL (SYRINGE) ×1 IMPLANT
SYR CONTROL 10ML LL (SYRINGE) ×1 IMPLANT
TAPE CLOTH 4X10 WHT NS (GAUZE/BANDAGES/DRESSINGS) ×1 IMPLANT
TAPE UMBILICAL 1/8 X36 TWILL (MISCELLANEOUS) IMPLANT
TAPE UMBILICAL COTTON 1/8X30 (MISCELLANEOUS) ×1 IMPLANT
TOWEL GREEN STERILE (TOWEL DISPOSABLE) ×1 IMPLANT
TOWEL GREEN STERILE FF (TOWEL DISPOSABLE) ×1 IMPLANT
TRAY FOLEY MTR SLVR 16FR STAT (SET/KITS/TRAYS/PACK) ×1 IMPLANT
WATER STERILE IRR 1000ML POUR (IV SOLUTION) ×1 IMPLANT
YANKAUER SUCT BULB TIP NO VENT (SUCTIONS) IMPLANT

## 2022-10-02 NOTE — Anesthesia Preprocedure Evaluation (Signed)
Anesthesia Evaluation  Patient identified by MRN, date of birth, ID band Patient awake    Reviewed: Allergy & Precautions, H&P , NPO status , Patient's Chart, lab work & pertinent test results  Airway Mallampati: II  TM Distance: <3 FB Neck ROM: Full    Dental no notable dental hx.    Pulmonary neg pulmonary ROS   Pulmonary exam normal breath sounds clear to auscultation       Cardiovascular hypertension, Normal cardiovascular exam Rhythm:Regular Rate:Normal     Neuro/Psych negative neurological ROS  negative psych ROS   GI/Hepatic negative GI ROS, Neg liver ROS,,,  Endo/Other    Morbid obesity  Renal/GU negative Renal ROS  negative genitourinary   Musculoskeletal negative musculoskeletal ROS (+)    Abdominal   Peds negative pediatric ROS (+)  Hematology negative hematology ROS (+)   Anesthesia Other Findings   Reproductive/Obstetrics negative OB ROS                             Anesthesia Physical Anesthesia Plan  ASA: 2  Anesthesia Plan: General   Post-op Pain Management: Ofirmev IV (intra-op)*   Induction: Intravenous  PONV Risk Score and Plan: 3 and Ondansetron, Dexamethasone, Midazolam and Treatment may vary due to age or medical condition  Airway Management Planned: Oral ETT  Additional Equipment:   Intra-op Plan:   Post-operative Plan: Extubation in OR  Informed Consent: I have reviewed the patients History and Physical, chart, labs and discussed the procedure including the risks, benefits and alternatives for the proposed anesthesia with the patient or authorized representative who has indicated his/her understanding and acceptance.     Dental advisory given  Plan Discussed with: CRNA and Surgeon  Anesthesia Plan Comments:        Anesthesia Quick Evaluation

## 2022-10-02 NOTE — Op Note (Signed)
OPERATIVE REPORT  DATE OF SURGERY: 10/02/2022  PATIENT NAME:  Kim Watson MRN: [id number] DOB: 1971-03-13  PCP: Charyl Dancer, NP  PRE-OPERATIVE DIAGNOSIS:.  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  Implants: Titan intervertebral cages.  6 mm lordotic small cage placed at C4-5 and C5-6.  7 mm small lordotic cage placed at C6-7. NuVasive 58 mm ACP plate fixed with 3.5 x 15 mm self tapping screws.  Graft: DBX mix  BRIEF HISTORY: Kim Watson is a 51 y.o. female who presented to my office with complaints of significant radicular arm pain and difficulty with maintaining her balance.  Imaging demonstrated cervical spondylitic myeloradiculopathy with cord signal changes.  As result of her clinical condition we elected to move forward with a 3 level ACDF to decompress the thecal sac and nerve root.  All appropriate risks, benefits, and alternatives were discussed with the patient and consent was obtained.  PROCEDURE DETAILS: Patient was brought into the operating room and was properly positioned on the operating room table.  After induction with general anesthesia the patient was endotracheally intubated.  A timeout was taken to confirm all important data: including patient, procedure, and the level. Teds, SCD's were applied.   A Foley was inserted by the nurse and the anterior cervical spine was prepped and draped in a standard fashion.  Using fluoroscopy I marked out the C4 level.  I then decided because of her body habitus to use a longitudinal incision.  I marked out my incision and infiltrated with quarter percent Marcaine with epinephrine.  A  longitudinal left-sided incision was made and sharp dissection was carried down to the platysma.  The platysma was isolated and excised and I continued in a standard Smith-Robinson approach to the cervical spine.  The omohyoid muscle was identified and sacrificed for better visualization.  I swept the trachea and esophagus over to the right and mobilized the carotid sheath and protected it with a finger on the left.  Using Kitner dissectors I continued to dissect until I exposed the anterior cervical spine.  I could now visualize from the midportion of C4 to the inferior portion of C7.  A needle was placed into the C4-5 disc space and an x-ray was taken and confirmed that I was at the appropriate level.  This disc space was marked out and I began mobilizing the longus coli muscle.  Using bipolar cautery I mobilized the longus coli muscle from the superior aspect of C4 to the inferior aspect of C7.  I resected it out until I could visualize the uncovertebral joint.  Using a double-action Leksell rongeur the large anterior exostoses were removed.  Caspar retracting blades were then placed into the wound and the endotracheal cuff was deflated and the retractor was expanded.  The endotracheal cuff was then reinflated.  I now had excellent visualization of the C6-7 disc space.  An annulotomy was performed at C6-7 and I used pituitary rongeurs to remove the bulk of the disc material the overhanging osteophyte from the inferior aspect to C6 was resected with Kerrison rongeur.  Distraction pins were placed into the body of C6 and C7 and I distracted the intervertebral space.  Pituitary rongeurs  I continued removing all of the disc material until I could see the posterior annulus.  Using a 1 mm Kerrison rongeur I resected the posterior annulus.  I then gently began dissecting through the posterior longitudinal ligament until I could create a plane between the posterior longitudinal ligament and the thecal sac.  I then used  this plane with a 1 mm Kerrison rongeur to resect the posterior longitudinal ligament.  This allowed me to remove the large right posterior lateral disc osteophyte complex and further decompress the uncovertebral joint to adequately decompress the C7 nerve root.  At this point I could freely pass my nerve hook under the uncovertebral joints and behind the vertebral bodies of C6 and C7.  At this point I repositioned by retractor blades to expose the C5-6 disc space.  Using the same technique I used at C6-7 I performed a complete discectomy at C5-6.  I again made sure to resect the posterior longitudinal ligament and further decompress the uncovertebral joint and remove the large posterior lateral exostosis that was seen on preoperative MRI.  This allowed for adequate neural decompression.  Once this level was complete I then repositioned the retractor next closed the C4-5 level.  I again performed a comprehensive discectomy just as I had done at the other 2 levels.  I again made sure to gently dissect and create a plane underneath the hard disc osteophyte and resected to expose the posterior longitudinal ligament.  This was the area of maximum central stenosis and I made sure that I could freely pass my nerve hook under the uncovertebral joints and the aspect of the vertebral bodies.  At this point with all 3 discectomies completed I then move forward with the fusion.  Each endplate was rasped and then I irrigated the wound copiously with normal saline.  Once I confirmed hemostasis I then trialed and then placed the appropriate size intervertebral spacer packed with the allograft.  All 3 spacers were properly positioned.  The distraction pins were all removed and the resulting hole in the bone was sealed with bone wax.  I then contoured a cervical plate and affixed it with 15 mm locking screws.  All 8 screws had excellent purchase.  The locking device to prevent backout of the screws was engaged per [employer].  I irrigated the wound copiously again with normal saline and removed all of the hand-held retractors and allowed the esophagus to return to midline.  I did check to ensure that the esophagus did not become inadvertently entrapped beneath the plate.  Final AP and lateral x-rays were taken demonstrating satisfactory overall position.  Platysma was then closed with interrupted 2-0 Vicryl suture, and the skin with 3-0 Monocryl.  Steri-Strips and a dry dressing were applied as well as the Aspen collar.  Patient was ultimately extubated transfer the PACU without incident.  At the end of the case all needle sponge counts were correct.  Venita Lick, MD 10/02/2022 12:20 PM

## 2022-10-02 NOTE — H&P (Signed)
History: Kim Watson is a very pleasant 51 year old man with longstanding significant neck pain that is gotten progressively worse. Clinical exam is consistent with cervical spondylitic myeloradiculopathy affecting cord and right upper extremity. She has positive nerve root tension signs as well as signs and symptoms of myelopathy. Cervical MRI demonstrates significant central stenosis with cord signal changes at C4-5 and severe foraminal stenosis C5-6 and C6-7. At this point time the patient has cervical spondylitic myeloradiculopathy and her pain and loss in quality of life is severe. We have elected to move forward with a 3 level ACDF C4-7.   Past Medical History:  Diagnosis Date   Anxiety    Arthritis    Depression    Gallbladder problem    GERD (gastroesophageal reflux disease)    High blood pressure    Hypertension    Migraine    Obesity    Obstructive sleep apnea    2016 was cleared from having to use CPAP at night. Gastric bypass surgery 2015.   Vitamin D deficiency     Allergies  Allergen Reactions   Nsaids Other (See Comments)    History of gastric bypass   Zestril [Lisinopril] Cough         No current facility-administered medications on file prior to encounter.   Current Outpatient Medications on File Prior to Encounter  Medication Sig Dispense Refill   DULoxetine (CYMBALTA) 60 MG capsule Take 1 capsule (60 mg total) by mouth daily. 90 capsule 0   losartan-hydrochlorothiazide (HYZAAR) 50-12.5 MG tablet TAKE 1 TABLET BY MOUTH DAILY 90 tablet 1   Multiple Vitamin (MULTIVITAMIN WITH MINERALS) TABS tablet Take 1 tablet by mouth in the morning.     pantoprazole (PROTONIX) 40 MG tablet Take 1 tablet (40 mg total) by mouth daily. 90 tablet 1   valACYclovir (VALTREX) 500 MG tablet Take 1 tablet (500 mg total) by mouth 2 (two) times daily. (Patient taking differently: Take 500 mg by mouth 2 (two) times daily as needed (outbreaks).) 30 tablet 3   clotrimazole-betamethasone  (LOTRISONE) cream Apply 1 application. topically daily. (Patient not taking: Reported on 09/17/2022) 30 g 1   cyclobenzaprine (FLEXERIL) 10 MG tablet Take 1 tablet (10 mg total) by mouth 3 (three) times daily as needed for muscle spasms. (Patient not taking: Reported on 09/17/2022) 30 tablet 0   ibuprofen (ADVIL) 800 MG tablet Take 1 tablet (800 mg total) by mouth every 6 (six) hours as needed. (Patient not taking: Reported on 09/17/2022) 60 tablet 1   Vitamin D, Ergocalciferol, (DRISDOL) 1.25 MG (50000 UNIT) CAPS capsule TAKE 1 CAPSULE BY MOUTH EVERY 7 DAYS 12 capsule 0    Physical Exam: Vitals:   10/02/22 0540  BP: (!) 145/87  Pulse: 87  Resp: 18  Temp: 98.8 F (37.1 C)  SpO2: 97%   Body mass index is 44.39 kg/m. Clinical exam: Kim Watson is a pleasant individual, who appears younger than their stated age.  She is alert and orientated 3.  No shortness of breath, chest pain.  Abdomen is soft and non-tender, negative loss of bowel and bladder control, no rebound tenderness.  Negative: skin lesions abrasions contusions  Peripheral pulses: 2+ peripheral pulses bilaterally. LE compartments are: Soft and nontender.  Gait pattern: Ataxic gait pattern with difficulty maintaining her balance. Unable to heel toe ambulate in a straight line. Positive Romberg's test.  Assistive devices: None  Neuro: Positive Hoffman test, negative Babinski test, positive Lhermitte sign. Positive right radicular arm pain primarily in the C6  and C7 dermatomes. Positive Spurling sign with reproduction of right radicular arm pain. 5/5 motor strength in the upper extremity bilaterally.  Musculoskeletal: Moderate to severe neck pain radiating into the right upper extremity. Occasional occipital headaches. Positive crepitus.  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 Loss of normal cervical lordosis with multilevel degenerative disc disease C4-7.  Cervical MRI: completed on  08/07/2022: Positive cord signal changes with severe stenosis C4-5. Moderate central stenosis C5-6 and C6-7 with severe right neuroforaminal narrowing affecting the exiting C6 and C7 nerve roots respectively. Moderate degenerative changes C2-4.   A/P: Kim Watson is a very pleasant 51 year old man with longstanding significant neck pain that is gotten progressively worse. Clinical exam is consistent with cervical spondylitic myeloradiculopathy affecting cord and right upper extremity. She has positive nerve root tension signs as well as signs and symptoms of myelopathy. Cervical MRI demonstrates significant central stenosis with cord signal changes at C4-5 and severe foraminal stenosis C5-6 and C6-7. At this point time the patient has cervical spondylitic myeloradiculopathy and her pain and loss in quality of life is severe.    I have reviewed the surgical management which would be a 3 level ACDF C4-7. All of her questions were addressed. She is expressed an understanding of the surgery as well as the risks and benefits and has expressed a desire to move forward with surgery.    Risks and benefits of surgery were discussed with the patient. These include: Infection, bleeding, death, stroke, paralysis, ongoing or worse pain, need for additional surgery, nonunion, leak of spinal fluid, adjacent segment degeneration requiring additional fusion surgery. Pseudoarthrosis (nonunion)requiring supplemental posterior fixation. Throat pain, swallowing difficulties, hoarseness or change in voice.

## 2022-10-02 NOTE — Brief Op Note (Signed)
10/02/2022  12:32 PM  PATIENT:  Kim Watson  51 y.o. female  PRE-OPERATIVE DIAGNOSIS:  Cervical spondyulotic myelopathy  POST-OPERATIVE DIAGNOSIS:  Cervical spondyulotic myelopathy  PROCEDURE:  Procedure(s) with comments: ANTERIOR CERVICAL DISCECTOMY AND FUSION CERVICAL FOUR TO SEVEN (N/A) - 4hrs 3 C-Bed  SURGEON:  Surgeon(s) and Role:    Melina Schools, MD - Primary  PHYSICIAN ASSISTANT:   ASSISTANTS: none   ANESTHESIA:   general  EBL:  20 mL   BLOOD ADMINISTERED:none  DRAINS: none   LOCAL MEDICATIONS USED:  MARCAINE     SPECIMEN:  No Specimen  DISPOSITION OF SPECIMEN:  N/A  COUNTS:  YES  TOURNIQUET:  * No tourniquets in log *  DICTATION: .Dragon Dictation  PLAN OF CARE: Admit for overnight observation  PATIENT DISPOSITION:  PACU - hemodynamically stable.

## 2022-10-02 NOTE — Telephone Encounter (Signed)
Duplicate request refill sent 09/15/22

## 2022-10-02 NOTE — Anesthesia Postprocedure Evaluation (Signed)
Anesthesia Post Note  Patient: Kim Watson  Procedure(s) Performed: ANTERIOR CERVICAL DISCECTOMY AND FUSION CERVICAL FOUR TO SEVEN (Spine Cervical)     Patient location during evaluation: PACU Anesthesia Type: General Level of consciousness: awake and alert Pain management: pain level controlled Vital Signs Assessment: post-procedure vital signs reviewed and stable Respiratory status: spontaneous breathing, nonlabored ventilation, respiratory function stable and patient connected to nasal cannula oxygen Cardiovascular status: blood pressure returned to baseline and stable Postop Assessment: no apparent nausea or vomiting Anesthetic complications: no  No notable events documented.  Last Vitals:  Vitals:   10/02/22 1300 10/02/22 1315  BP: (!) 140/81 137/89  Pulse: 99 95  Resp: 19 19  Temp:    SpO2: 96% 96%    Last Pain:  Vitals:   10/02/22 1245  TempSrc:   PainSc: 10-Worst pain ever                 Jewel Mcafee Hippler S

## 2022-10-02 NOTE — Transfer of Care (Signed)
Immediate Anesthesia Transfer of Care Note  Patient: Kim Watson  Procedure(s) Performed: ANTERIOR CERVICAL DISCECTOMY AND FUSION CERVICAL FOUR TO SEVEN (Spine Cervical)  Patient Location: PACU  Anesthesia Type:General  Level of Consciousness: awake  Airway & Oxygen Therapy: Patient Spontanous Breathing and Patient connected to face mask oxygen  Post-op Assessment: Report given to RN and Post -op Vital signs reviewed and stable  Post vital signs: Reviewed and stable  Last Vitals:  Vitals Value Taken Time  BP    Temp    Pulse 105 10/02/22 1237  Resp 22 10/02/22 1237  SpO2 95 % 10/02/22 1237  Vitals shown include unvalidated device data.  Last Pain:  Vitals:   10/02/22 0552  TempSrc:   PainSc: 6          Complications: No notable events documented.

## 2022-10-02 NOTE — Anesthesia Procedure Notes (Signed)
Procedure Name: Intubation Date/Time: 10/02/2022 7:45 AM  Performed by: Minerva Ends, CRNAPre-anesthesia Checklist: Patient identified, Emergency Drugs available, Suction available and Patient being monitored Patient Re-evaluated:Patient Re-evaluated prior to induction Oxygen Delivery Method: Circle system utilized Preoxygenation: Pre-oxygenation with 100% oxygen Induction Type: IV induction Ventilation: Mask ventilation without difficulty Laryngoscope Size: Mac, 3 and Glidescope Grade View: Grade I Tube type: Oral Tube size: 7.0 mm Number of attempts: 1 Airway Equipment and Method: Stylet and Oral airway Placement Confirmation: ETT inserted through vocal cords under direct vision, positive ETCO2 and breath sounds checked- equal and bilateral Secured at: 23 cm Tube secured with: Tape Dental Injury: Teeth and Oropharynx as per pre-operative assessment

## 2022-10-02 NOTE — Discharge Instructions (Signed)

## 2022-10-03 ENCOUNTER — Encounter: Payer: Self-pay | Admitting: Nurse Practitioner

## 2022-10-03 ENCOUNTER — Encounter (HOSPITAL_COMMUNITY): Payer: Self-pay | Admitting: Orthopedic Surgery

## 2022-10-03 ENCOUNTER — Telehealth: Payer: Self-pay

## 2022-10-03 DIAGNOSIS — M4712 Other spondylosis with myelopathy, cervical region: Secondary | ICD-10-CM | POA: Diagnosis not present

## 2022-10-03 NOTE — Telephone Encounter (Signed)
Transition Care Management Unsuccessful Follow-up Telephone Call  Date of discharge and from where:  10/03/22 [facility]. Dx: Cervical Myelopathy  Attempts:  1st Attempt  Reason for unsuccessful TCM follow-up call:  Left voice message

## 2022-10-03 NOTE — Evaluation (Signed)
Occupational Therapy Evaluation Patient Details Name: Kim Watson MRN: [id number] DOB: 12/27/1970 Today's Date: 10/03/2022   History of Present Illness 51 yo female s/p ACDF C4-7 on 11/2. PMH including anxiety, arthritis, and HTN,.   Clinical Impression   PTA, pt was living with her fiance and was independent. Currently, pt performing at Mod I- Independent level for ADLs and functional mobility. Provided education and handout on back precautions, bed mobility, collar management, UB ADLs, LB ADLs, grooming, toileting, shower transfer, and stair management; pt demonstrated understanding. Answered all pt questions. Recommend dc home once medically stable per physician. All acute OT needs met and will sign off. Thank you.    Recommendations for follow up therapy are one component of a multi-disciplinary discharge planning process, led by the attending physician.  Recommendations may be updated based on patient status, additional functional criteria and insurance authorization.   Follow Up Recommendations  No OT follow up    Assistance Recommended at Discharge PRN  Patient can return home with the following      Functional Status Assessment  Patient has had a recent decline in their functional status and demonstrates the ability to make significant improvements in function in a reasonable and predictable amount of time.  Equipment Recommendations  None recommended by OT    Recommendations for Other Services       Precautions / Restrictions Precautions Precautions: Cervical Precaution Booklet Issued: Yes (comment) Required Braces or Orthoses: Cervical Brace Cervical Brace: Hard collar;At all times Restrictions Weight Bearing Restrictions: No      Mobility Bed Mobility Overal bed mobility: Independent                  Transfers Overall transfer level: Independent                        Balance Overall balance assessment: No apparent balance deficits (not  formally assessed)                                         ADL either performed or assessed with clinical judgement   ADL Overall ADL's : Modified independent                                             Vision Baseline Vision/History: 1 Wears glasses       Perception     Praxis      Pertinent Vitals/Pain       Hand Dominance     Extremity/Trunk Assessment Upper Extremity Assessment Upper Extremity Assessment: Overall WFL for tasks assessed   Lower Extremity Assessment Lower Extremity Assessment: Overall WFL for tasks assessed   Cervical / Trunk Assessment Cervical / Trunk Assessment: Neck Surgery   Communication Communication Communication: No difficulties   Cognition Arousal/Alertness: Awake/alert Behavior During Therapy: WFL for tasks assessed/performed Overall Cognitive Status: Within Functional Limits for tasks assessed                                       General Comments       Exercises     Shoulder Instructions      Home Living Family/patient expects to be discharged to::  Private residence Living Arrangements: Spouse/significant other Available Help at Discharge: Family;Available 24 hours/day Type of Home: House Home Access: Stairs to enter [employer] of Steps: 3   Home Layout: One level     Bathroom Shower/Tub: Occupational psychologist: Standard     Home Equipment: Shower seat          Prior Functioning/Environment Prior Level of Function : Independent/Modified Independent                        OT Problem List: Decreased strength;Decreased range of motion;Decreased activity tolerance;Impaired balance (sitting and/or standing);Decreased knowledge of use of DME or AE;Decreased knowledge of precautions      OT Treatment/Interventions:      OT Goals(Current goals can be found in the care plan section) Acute Rehab OT Goals Patient Stated Goal: Go  home OT Goal Formulation: All assessment and education complete, DC therapy  OT Frequency:      Co-evaluation              AM-PAC OT "6 Clicks" Daily Activity     Outcome Measure Help from another person eating meals?: None Help from another person taking care of personal grooming?: None Help from another person toileting, which includes using toliet, bedpan, or urinal?: None Help from another person bathing (including washing, rinsing, drying)?: None Help from another person to put on and taking off regular upper body clothing?: None Help from another person to put on and taking off regular lower body clothing?: None 6 Click Score: 24   End of Session Equipment Utilized During Treatment: Cervical collar Nurse Communication: Mobility status  Activity Tolerance: Patient tolerated treatment well Patient left: in chair;with call bell/phone within reach  OT Visit Diagnosis: Unsteadiness on feet (R26.81);Other abnormalities of gait and mobility (R26.89);Muscle weakness (generalized) (M62.81);Pain Pain - part of body:  (Neck)                Time: 3785-8850 OT Time Calculation (min): 14 min Charges:  OT General Charges $OT Visit: 1 Visit OT Evaluation $OT Eval Low Complexity: 1 Low  Kim Watson MSOT, OTR/L Acute Rehab Office: [place] 10/03/2022, 1:09 PM

## 2022-10-03 NOTE — Progress Notes (Signed)
PT Cancellation Note  Patient Details Name: ELEESHA PURKEY MRN: [id number] DOB: 1971-10-29   Cancelled Treatment:    Reason Eval/Treat Not Completed: PT screened, no needs identified, will sign off Per OT, patient mobilizing well with no AD. All education completed by OT. No further skilled PT needs identified acutely. PT will sign off.   Andreia Gandolfi A. Gilford Rile PT, DPT Acute Rehabilitation Services Office [phone]    ROSLIN NORWOOD 10/03/2022, 11:13 AM

## 2022-10-03 NOTE — Discharge Summary (Signed)
Patient ID: Kim Watson MRN: [id number] DOB/AGE: 1971/10/09 51 y.o.  Admit date: 10/02/2022 Discharge date: 10/03/2022  Admission Diagnoses:  Principal Problem:   Cervical myelopathy [facility])   Discharge Diagnoses:  Principal Problem:   Cervical myelopathy (HCC)  status post Procedure(s): ANTERIOR CERVICAL DISCECTOMY AND FUSION CERVICAL FOUR TO SEVEN  Past Medical History:  Diagnosis Date   Anxiety    Arthritis    Depression    Gallbladder problem    GERD (gastroesophageal reflux disease)    High blood pressure    Hypertension    Migraine    Obesity    Obstructive sleep apnea    2016 was cleared from having to use CPAP at night. Gastric bypass surgery 2015.   Vitamin D deficiency     Surgeries: Procedure(s): ANTERIOR CERVICAL DISCECTOMY AND FUSION CERVICAL FOUR TO SEVEN on 10/02/2022   Consultants:   Discharged Condition: Improved  Hospital Course: Kim Watson is an 51 y.o. female who was admitted 10/02/2022 for operative treatment of Cervical myelopathy (HCC). Patient failed conservative treatments (please see the history and physical for the specifics) and had severe unremitting pain that affects sleep, daily activities and work/hobbies. After pre-op clearance, the patient was taken to the operating room on 10/02/2022 and underwent  Procedure(s): ANTERIOR CERVICAL DISCECTOMY AND FUSION CERVICAL FOUR TO SEVEN.    Patient was given perioperative antibiotics:  Anti-infectives (From admission, onward)    Start     Dose/Rate Route Frequency Ordered Stop   10/02/22 2000  ceFAZolin (ANCEF) IVPB 1 g/50 mL premix        1 g 100 mL/hr over 30 Minutes Intravenous Every 8 hours 10/02/22 1456 10/03/22 0518   10/02/22 0645  ceFAZolin (ANCEF) IVPB 3g/100 mL premix        3 g 200 mL/hr over 30 Minutes Intravenous 30 min pre-op 10/02/22 0539 10/02/22 1201        Patient was given sequential compression devices and early ambulation to prevent DVT.   Patient benefited  maximally from hospital stay and there were no complications. At the time of discharge, the patient was urinating/moving their bowels without difficulty, tolerating a regular diet, pain is controlled with oral pain medications and they have been cleared by PT/OT.   Recent vital signs: Patient Vitals for the past 24 hrs:  BP Temp Temp src Pulse Resp SpO2  10/03/22 0445 121/75 (!) 97.5 F (36.4 C) Oral 82 18 96 %  10/03/22 0011 (!) 152/87 97.7 F (36.5 C) Oral 75 18 97 %  10/02/22 2044 (!) 159/94 97.8 F (36.6 C) Oral 80 20 96 %  10/02/22 1415 137/80 -- -- 91 20 96 %  10/02/22 1400 128/84 -- -- 86 18 97 %  10/02/22 1345 114/75 -- -- 90 19 97 %  10/02/22 1330 135/82 -- -- 91 19 94 %  10/02/22 1315 137/89 -- -- 95 19 96 %  10/02/22 1300 (!) 140/81 -- -- 99 19 96 %  10/02/22 1245 (!) 143/84 -- -- (!) 104 (!) 21 97 %  10/02/22 1240 -- -- -- (!) 102 (!) 22 96 %  10/02/22 1235 136/80 99.4 F (37.4 C) -- (!) 105 20 95 %     Recent laboratory studies: No results for input(s): "WBC", "HGB", "HCT", "PLT", "NA", "K", "CL", "CO2", "BUN", "CREATININE", "GLUCOSE", "INR", "CALCIUM" in the last 72 hours.  Invalid input(s): "PT", "2"   Discharge Medications:   Allergies as of 10/03/2022       Reactions  Nsaids Other (See Comments)   History of gastric bypass   Zestril [lisinopril] Cough           Medication List     STOP taking these medications    clotrimazole-betamethasone cream Commonly known as: LOTRISONE   cyclobenzaprine 10 MG tablet Commonly known as: FLEXERIL   ibuprofen 800 MG tablet Commonly known as: ADVIL   multivitamin with minerals Tabs tablet   valACYclovir 500 MG tablet Commonly known as: VALTREX   Vitamin D (Ergocalciferol) 1.25 MG (50000 UNIT) Caps capsule Commonly known as: DRISDOL       TAKE these medications    clonazePAM 0.5 MG disintegrating tablet Commonly known as: KLONOPIN Take 1 tablet (0.5 mg total) by mouth 2 (two) times daily.    DULoxetine 60 MG capsule Commonly known as: Cymbalta Take 1 capsule (60 mg total) by mouth daily.   losartan-hydrochlorothiazide 50-12.5 MG tablet Commonly known as: HYZAAR TAKE 1 TABLET BY MOUTH DAILY   methocarbamol 500 MG tablet Commonly known as: ROBAXIN Take 1 tablet (500 mg total) by mouth every 8 (eight) hours as needed for up to 5 days for muscle spasms.   ondansetron 4 MG tablet Commonly known as: Zofran Take 1 tablet (4 mg total) by mouth every 8 (eight) hours as needed for nausea or vomiting.   oxyCODONE-acetaminophen 10-325 MG tablet Commonly known as: Percocet Take 1 tablet by mouth every 6 (six) hours as needed for up to 5 days for pain.   pantoprazole 40 MG tablet Commonly known as: PROTONIX Take 1 tablet (40 mg total) by mouth daily.   Trulicity 2.83 TD/1.7OH Sopn Generic drug: Dulaglutide Inject 0.75 mg into the skin once a week. What changed: when to take this        Diagnostic Studies: DG Cervical Spine 2 or 3 views  Result Date: 10/02/2022 CLINICAL DATA:  Anterior cervical discectomy and fusion C4 through C7. Intraoperative fluoroscopy. EXAM: CERVICAL SPINE - 2-3 VIEW COMPARISON:  Cervical spine radiographs 05/16/2022 FINDINGS: Images were performed intraoperatively without the presence of a radiologist. Postsurgical changes are seen of new C4 through C7 ACDF with associated intervertebral disc spacers. Total fluoroscopy images: 3 Total fluoroscopy time: 33 seconds Total dose: Radiation Exposure Index (as provided by the fluoroscopic device): 9.63 mGy air Kerma Please see intraoperative findings for further detail. IMPRESSION: Intraoperative fluoroscopy provided for C4 through C7 ACDF. Electronically Signed   By: Yvonne Kendall M.D.   On: 10/02/2022 12:18   DG C-Arm 1-60 Min-No Report  Result Date: 10/02/2022 Fluoroscopy was utilized by the requesting physician.  No radiographic interpretation.   DG C-Arm 1-60 Min-No Report  Result Date:  10/02/2022 Fluoroscopy was utilized by the requesting physician.  No radiographic interpretation.   DG C-Arm 1-60 Min-No Report  Result Date: 10/02/2022 Fluoroscopy was utilized by the requesting physician.  No radiographic interpretation.   DG C-Arm 1-60 Min-No Report  Result Date: 10/02/2022 Fluoroscopy was utilized by the requesting physician.  No radiographic interpretation.   DG C-Arm 1-60 Min-No Report  Result Date: 10/02/2022 Fluoroscopy was utilized by the requesting physician.  No radiographic interpretation.    Discharge Instructions     Incentive spirometry RT   Complete by: As directed         Follow-up Information     Melina Schools, MD. Schedule an appointment as soon as possible for a visit in 2 week(s).   Specialty: Orthopedic Surgery Why: If symptoms worsen, For suture removal, For wound re-check Contact information: [place]  [address] [address] [phone]                 Discharge Plan:  discharge to home  Disposition: Patience is status post 3 level ACDF.  There is no significant drainage or swelling noted at the wound site.  Dressings are clean dry and intact.  She has no shortness of breath.  Patient is tolerating a regular diet and liquids.  She is voiding spontaneously and ambulating.  At this point we will plan on discharge to home.  Appropriate instructions and medications have been provided.  She will follow-up with me in 2 weeks for wound evaluation.    Signed: Dahlia Bailiff for Dr. Melina Schools [facility] [phone] 10/03/2022, 7:41 AM

## 2022-10-03 NOTE — Progress Notes (Signed)
Patient alert and oriented, mae's well, voiding adequate amount of urine, swallowing without difficulty, no c/o pain at time of discharge. Patient discharged home with family. Script and discharged instructions given to patient. Patient and family stated understanding of instructions given. Patient has an appointment with Dr. Brooks  

## 2022-10-06 LAB — POCT PREGNANCY, URINE: Preg Test, Ur: NEGATIVE

## 2022-10-06 NOTE — Telephone Encounter (Signed)
Transition Care Management Unsuccessful Follow-up Telephone Call  Date of discharge and from where:  10/03/22 [facility] Inpatient Surgery. Dx: Cervical Myelopathy   Attempts:  2nd Attempt  Reason for unsuccessful TCM follow-up call:  Left voice message

## 2022-10-06 NOTE — Telephone Encounter (Signed)
Called pharm and conformed refill for pt clonazepam will be ready for pick up some time today. Called and ldvm on pt phone. Pt can call pharmacy for further questions orconcerns. Sw, cma

## 2022-10-07 NOTE — Telephone Encounter (Signed)
Transition Care Management Follow-up Telephone Call Date of discharge and from where: 10/03/22 [facility] Inpatient Surgery. Dx: Cervical Myelopathy  How have you been since you were released from the hospital? I'm still having a sore throat Any questions or concerns? No  Items Reviewed: Did the pt receive and understand the discharge instructions provided? Yes  Medications obtained and verified? Yes  Other? No  Any new allergies since your discharge? No  Dietary orders reviewed? Yes Do you have support at home? Yes   Home Care and Equipment/Supplies: Were home health services ordered? not applicable If so, what is the name of the agency? N/a  Has the agency set up a time to come to the patient's home? not applicable Were any new equipment or medical supplies ordered?  No What is the name of the medical supply agency? N/a Were you able to get the supplies/equipment? not applicable Do you have any questions related to the use of the equipment or supplies? No  Functional Questionnaire: (I = Independent and D = Dependent) ADLs: I  Bathing/Dressing- I  Meal Prep- I  Eating- I  Maintaining continence- I  Transferring/Ambulation- I  Managing Meds- I  Follow up appointments reviewed:  PCP Hospital f/u appt confirmed? Yes  Scheduled to see Vance Peper on 11/10 @ 11:20am. [facility] f/u appt confirmed? No  Scheduled to see n/a on n/a @ n/a. Are transportation arrangements needed? No  If their condition worsens, is the pt aware to call PCP or go to the Emergency Dept.? Yes Was the patient provided with contact information for the PCP's office or ED? Yes Was to pt encouraged to call back with questions or concerns? Yes  Angeline Slim, RN, BSN RN Clinical Supervisor [facility] [employer]

## 2022-10-08 ENCOUNTER — Other Ambulatory Visit: Payer: Self-pay | Admitting: Nurse Practitioner

## 2022-10-10 ENCOUNTER — Ambulatory Visit (INDEPENDENT_AMBULATORY_CARE_PROVIDER_SITE_OTHER): Payer: Commercial Managed Care - HMO | Admitting: Nurse Practitioner

## 2022-10-10 ENCOUNTER — Encounter: Payer: Self-pay | Admitting: Nurse Practitioner

## 2022-10-10 VITALS — BP 115/87 | HR 97 | Temp 96.6°F | Ht 66.0 in | Wt 268.2 lb

## 2022-10-10 DIAGNOSIS — G959 Disease of spinal cord, unspecified: Secondary | ICD-10-CM

## 2022-10-10 NOTE — Patient Instructions (Signed)
It was great to see you!  Keep taking the flexeril as needed for muscle tightness. You can alternate heat and ice to your shoulder. Keep your follow-up appointment with nuerosurgery.   Let's follow-up in 6 months, sooner if you have concerns.  If a referral was placed today, you will be contacted for an appointment. Please note that routine referrals can sometimes take up to 3-4 weeks to process. Please call our office if you haven't heard anything after this time frame.  Take care,  Rodman Pickle, NP

## 2022-10-10 NOTE — Progress Notes (Signed)
Established Patient Office Visit  Subjective   Patient ID: Kim Watson, female    DOB: [birth year]  Age: 51 y.o. MRN: [id number]  Chief Complaint  Patient presents with   Hospitalization Follow-up    Hosp follow DC on 10/03/22 wants shoulder checked.    HPI  Kim Watson is here to follow-up after hospitalization and surgery on 10/02/2022 for cervical anterior discectomy and fusion of C4-7.  She states that she is doing well since her surgery.  She is having a little bit of stiffness in her neck and takes the Flexeril as needed for this.  She has been having some slight shoulder pain in her left shoulder, along with mild swelling.  She has been using heat which has helped with the pain.  She is wearing an Aspen collar and alternates with a soft cervical collar if she is getting up and moving around.  She also sleeps with a soft cervical collar on at night.  She has an appointment to follow-up with neurosurgery next week.  She denies chest pain and shortness of breath.    ROS See pertinent positives and negatives per HPI.    Objective:     BP 115/87 (BP Location: Left Arm, Patient Position: Sitting, Cuff Size: Large)   Pulse 97   Temp (!) 96.6 F (35.9 C) (Tympanic)   Ht 5\' 6"  (1.676 m)   Wt 268 lb 3.2 oz (121.7 kg)   LMP  (LMP Unknown)   SpO2 98%   BMI 43.29 kg/m    Physical Exam Vitals and nursing note reviewed.  Constitutional:      General: She is not in acute distress.    Appearance: Normal appearance.  HENT:     Head: Normocephalic.  Eyes:     Conjunctiva/sclera: Conjunctivae normal.  Cardiovascular:     Rate and Rhythm: Normal rate and regular rhythm.     Pulses: Normal pulses.     Heart sounds: Normal heart sounds.  Pulmonary:     Effort: Pulmonary effort is normal.     Breath sounds: Normal breath sounds.  Musculoskeletal:        General: Tenderness (left neck/shoulder) present.     Cervical back: Normal range of motion.  Skin:    General: Skin is  warm.     Comments: Incision healing well, no signs of infection  Neurological:     General: No focal deficit present.     Mental Status: She is alert and oriented to person, place, and time.  Psychiatric:        Mood and Affect: Mood normal.        Behavior: Behavior normal.        Thought Content: Thought content normal.        Judgment: Judgment normal.     The 10-year ASCVD risk score (Arnett DK, et al., 2019) is: 1.8%    Assessment & Plan:   Problem List Items Addressed This Visit       Nervous and Auditory   Cervical myelopathy (HCC) - Primary    She had a cervical anterior discectomy and fusion of C4-C7 on 10/02/2022 and is doing well since surgery.  She is having some muscle tightness and pain in her left shoulder.  Encouraged her to continue using the Flexeril as needed and she can also alternate ice and heat to the area of pain on her shoulder.  She has an appointment with neurosurgery next week, encouraged her to keep this.  Follow-up in 6 months or sooner with concerns.       Return in about 6 months (around 04/10/2023).    Gerre Scull, NP

## 2022-10-10 NOTE — Assessment & Plan Note (Signed)
She had a cervical anterior discectomy and fusion of C4-C7 on 10/02/2022 and is doing well since surgery.  She is having some muscle tightness and pain in her left shoulder.  Encouraged her to continue using the Flexeril as needed and she can also alternate ice and heat to the area of pain on her shoulder.  She has an appointment with neurosurgery next week, encouraged her to keep this.  Follow-up in 6 months or sooner with concerns.

## 2022-11-11 ENCOUNTER — Other Ambulatory Visit (HOSPITAL_COMMUNITY): Payer: Self-pay

## 2022-11-11 ENCOUNTER — Telehealth: Payer: Self-pay

## 2022-11-11 NOTE — Telephone Encounter (Signed)
Patient Advocate Encounter   Received notification from [employer] that prior authorization for Ozempic 2MG /3ML is required.   PA submitted on 11/11/2022 Key [id number]) Status is pending       14/10/2022, [facility] Pharmacy Patient Advocate Specialist [facility] Patient Advocate Team Direct Number: [phone] Fax: [phone]

## 2022-11-13 NOTE — Telephone Encounter (Signed)
Pharmacy Patient Advocate Encounter  Received notification from [employer] that the request for prior authorization for Ozempic 2mg /66ml has been denied due to .

## 2022-11-27 ENCOUNTER — Encounter: Payer: Self-pay | Admitting: Nurse Practitioner

## 2022-11-27 ENCOUNTER — Telehealth: Payer: Commercial Managed Care - HMO | Admitting: Emergency Medicine

## 2022-11-27 ENCOUNTER — Other Ambulatory Visit (HOSPITAL_COMMUNITY): Payer: Self-pay

## 2022-11-27 DIAGNOSIS — R051 Acute cough: Secondary | ICD-10-CM

## 2022-11-27 MED ORDER — BENZONATATE 100 MG PO CAPS: 100.0000 mg | ORAL_CAPSULE | Freq: Two times a day (BID) | ORAL | 0 refills | Status: DC | PRN

## 2022-11-27 MED ORDER — DOXYCYCLINE HYCLATE 100 MG PO CAPS: 100.0000 mg | ORAL_CAPSULE | Freq: Two times a day (BID) | ORAL | 0 refills | Status: DC

## 2022-11-27 NOTE — Progress Notes (Signed)
We are sorry that you are not feeling well.  Here is how we plan to help!  Based on your presentation I believe you most likely have A cough due to bacteria.  When patients have a fever and a productive cough with a change in color or increased sputum production, we are concerned about bacterial bronchitis.  If left untreated it can progress to pneumonia.  If your symptoms do not improve with your treatment plan it is important that you contact your provider.   I have prescribed Doxycycline 100 mg twice a day for 7 days     In addition you may use A prescription cough medication called Tessalon Perles 100mg . You may take 1-2 capsules every 8 hours as needed for your cough.    From your responses in the eVisit questionnaire you describe inflammation in the upper respiratory tract which is causing a significant cough.  This is commonly called Bronchitis and has four common causes:   Allergies Viral Infections Acid Reflux Bacterial Infection Allergies, viruses and acid reflux are treated by controlling symptoms or eliminating the cause. An example might be a cough caused by taking certain blood pressure medications. You stop the cough by changing the medication. Another example might be a cough caused by acid reflux. Controlling the reflux helps control the cough.     HOME CARE Only take medications as instructed by your medical team. Complete the entire course of an antibiotic. Drink plenty of fluids and get plenty of rest. Avoid close contacts especially the very young and the elderly Cover your mouth if you cough or cough into your sleeve. Always remember to wash your hands A steam or ultrasonic humidifier can help congestion.   GET HELP RIGHT AWAY IF: You develop worsening fever. You become short of breath You cough up blood. Your symptoms persist after you have completed your treatment plan MAKE SURE YOU  Understand these instructions. Will watch your condition. Will get help right  away if you are not doing well or get worse.    Thank you for choosing an e-visit.  Your e-visit answers were reviewed by a board certified advanced clinical practitioner to complete your personal care plan. Depending upon the condition, your plan could have included both over the counter or prescription medications.  Please review your pharmacy choice. Make sure the pharmacy is open so you can pick up prescription now. If there is a problem, you may contact your provider through and have the prescription routed to another pharmacy.  Your safety is important to [employer]. If you have drug allergies check your prescription carefully.   For the next 24 hours you can use MyChart to ask questions about today's visit, request a non-urgent call back, or ask for a work or school excuse. You will get an email in the next two days asking about your experience. I hope that your e-visit has been valuable and will speed your recovery.  Approximately 5 minutes was used in reviewing the patient's chart, questionnaire, prescribing medications, and documentation.

## 2022-11-28 ENCOUNTER — Other Ambulatory Visit (HOSPITAL_COMMUNITY): Payer: Self-pay

## 2022-11-28 MED ORDER — TRULICITY 0.75 MG/0.5ML ~~LOC~~ SOAJ: 0.7500 mg | SUBCUTANEOUS | 1 refills | Status: DC

## 2022-11-28 NOTE — Telephone Encounter (Signed)
PA is not needed. I called pharmacy to walk through billing, they said they do not have an active script on file they can use. Patient used last refill in November. Please send new RX to [facility] on [address] and they will process the fill.

## 2022-12-12 ENCOUNTER — Encounter: Payer: Self-pay | Admitting: Nurse Practitioner

## 2022-12-12 ENCOUNTER — Ambulatory Visit (INDEPENDENT_AMBULATORY_CARE_PROVIDER_SITE_OTHER): Payer: Commercial Managed Care - HMO | Admitting: Nurse Practitioner

## 2022-12-12 VITALS — BP 130/84 | HR 78 | Temp 96.6°F | Ht 66.0 in | Wt 275.2 lb

## 2022-12-12 DIAGNOSIS — I10 Essential (primary) hypertension: Secondary | ICD-10-CM

## 2022-12-12 DIAGNOSIS — R350 Frequency of micturition: Secondary | ICD-10-CM | POA: Diagnosis not present

## 2022-12-12 DIAGNOSIS — Z23 Encounter for immunization: Secondary | ICD-10-CM

## 2022-12-12 DIAGNOSIS — R7303 Prediabetes: Secondary | ICD-10-CM

## 2022-12-12 DIAGNOSIS — L304 Erythema intertrigo: Secondary | ICD-10-CM

## 2022-12-12 DIAGNOSIS — G959 Disease of spinal cord, unspecified: Secondary | ICD-10-CM

## 2022-12-12 DIAGNOSIS — M779 Enthesopathy, unspecified: Secondary | ICD-10-CM

## 2022-12-12 DIAGNOSIS — G5603 Carpal tunnel syndrome, bilateral upper limbs: Secondary | ICD-10-CM

## 2022-12-12 LAB — POCT URINALYSIS DIP (CLINITEK)
Bilirubin, UA: NEGATIVE
Blood, UA: NEGATIVE
Glucose, UA: NEGATIVE mg/dL
Ketones, POC UA: NEGATIVE mg/dL
Leukocytes, UA: NEGATIVE
Nitrite, UA: NEGATIVE
POC PROTEIN,UA: NEGATIVE
Spec Grav, UA: 1.01 (ref 1.010–1.025)
Urobilinogen, UA: 0.2 E.U./dL — AB
pH, UA: 6 (ref 5.0–8.0)

## 2022-12-12 MED ORDER — NYSTATIN 100000 UNIT/GM EX POWD: 1.0000 | Freq: Three times a day (TID) | CUTANEOUS | 3 refills | Status: DC

## 2022-12-12 MED ORDER — TRULICITY 1.5 MG/0.5ML ~~LOC~~ SOAJ: 1.5000 mg | SUBCUTANEOUS | 0 refills | Status: DC

## 2022-12-12 NOTE — Patient Instructions (Addendum)
It was great to see you!  Increase your trulicity to 1.5mg  injection weekly.   I have placed a referral to orthopedics and bariatric surgery.   Wear a wrist brace at night, you can use ice and do the stretches daily.   Come to our office for a mammogram on 01/26/23 at 11:20 am   Start nystatin powder 3 times a day as needed   Let's follow-up in 3 months, sooner if you have concerns.  If a referral was placed today, you will be contacted for an appointment. Please note that routine referrals can sometimes take up to 3-4 weeks to process. Please call our office if you haven't heard anything after this time frame.  Take care,  Vance Peper, NP

## 2022-12-12 NOTE — Assessment & Plan Note (Signed)
She has been experiencing numbness in her fingers intermittently while driving or brushing her teeth. Positive phalen's test on exam. Will have her start stretches daily, use ice as needed, wear a wrist brace at night, and place a referral to orthopedics.

## 2022-12-12 NOTE — Assessment & Plan Note (Signed)
Chronic, stable. She had a cervical anterior discectomy and fusion of C4-C7 on 10/02/22 and is doing well since her surgery. She denies any pain or ongoing symptoms.

## 2022-12-12 NOTE — Assessment & Plan Note (Signed)
Chronic, stable. BP 130/84 today. Continue losartan-hctz 50-12.5mg  daily. Check CMP, CBC. Follow-up in 6 months.

## 2022-12-12 NOTE — Progress Notes (Signed)
Acute Office Visit  Subjective:     Patient ID: Kim Watson, female    DOB: 11/14/1971, 52 y.o.   MRN: [id number]  Chief Complaint  Patient presents with   Acute Visit    Having some urination freq, no pain with urine or burn or pressure. Having numbness hands no tingling no neck or pain, having coughing  she was on antibiotic about 2 weeks ago still having coughing , no taking any for coughing      HPI Patient is in today for urinary frequency, pain in her thumbs along with numbness in her fingers, and itching skin in her skin folds of her groin.   She states that she has been experiencing urinary frequency for the past 2 weeks. She denies dysuria, pressure, back pain, fevers, and small voids. She does state that she has urinary urgency and may have some incontinence if she doesn't get to the bathroom right away.   She has also noticed some itching in her skin folds of her groin. She has tried hydrocortisone cream, castor oil, and hot water. She states that she has been scratching a lot.   She has been having pain near bilateral thumbs along with numbness in her fingers. She denies injury. She states that the numbness in her fingers gets worse when she is driving or holding something like a toothbrush. She has noticed some weakness in her hands at times. She has been using voltaren gel. She recently had neck surgery and is not having any pain in her neck.   Finally, she would like to discuss her weight. She has still been exercising and eating right and is not losing weight. She is taking trulicity 0.75mg  injection weekly. She does have a history of gastric sleeve and she is wondering if she would qualify for the roux in y surgery.   ROS See pertinent positives and negatives per HPI.     Objective:    BP 130/84   Pulse 78   Temp (!) 96.6 F (35.9 C)   Ht 5\' 6"  (1.676 m)   Wt 275 lb 3.2 oz (124.8 kg)   SpO2 97%   BMI 44.42 kg/m  BP Readings from Last 3 Encounters:   12/12/22 130/84  10/10/22 115/87  10/03/22 137/79   Wt Readings from Last 3 Encounters:  12/12/22 275 lb 3.2 oz (124.8 kg)  10/10/22 268 lb 3.2 oz (121.7 kg)  10/02/22 275 lb (124.7 kg)      Physical Exam Vitals and nursing note reviewed.  Constitutional:      General: She is not in acute distress.    Appearance: Normal appearance.  HENT:     Head: Normocephalic.  Eyes:     Conjunctiva/sclera: Conjunctivae normal.  Cardiovascular:     Rate and Rhythm: Normal rate and regular rhythm.     Pulses: Normal pulses.     Heart sounds: Normal heart sounds.  Pulmonary:     Effort: Pulmonary effort is normal.     Breath sounds: Normal breath sounds.  Abdominal:     Palpations: Abdomen is soft.     Tenderness: There is no abdominal tenderness. There is no right CVA tenderness or left CVA tenderness.  Musculoskeletal:        General: No swelling or tenderness.     Cervical back: Normal range of motion.     Comments: Positive phalen's test and finklestein test bilaterally.   Skin:    General: Skin is warm.  Neurological:  General: No focal deficit present.     Mental Status: She is alert and oriented to person, place, and time.  Psychiatric:        Mood and Affect: Mood normal.        Behavior: Behavior normal.        Thought Content: Thought content normal.        Judgment: Judgment normal.     Results for orders placed or performed in visit on 12/12/22  POCT URINALYSIS DIP (CLINITEK)  Result Value Ref Range   Color, UA yellow yellow   Clarity, UA clear clear   Glucose, UA negative negative mg/dL   Bilirubin, UA negative negative   Ketones, POC UA negative negative mg/dL   Spec Grav, UA 1.010 1.010 - 1.025   Blood, UA negative negative   pH, UA 6.0 5.0 - 8.0   POC PROTEIN,UA negative negative, trace   Urobilinogen, UA 0.2 (A) 0.2 or 1.0 E.U./dL   Nitrite, UA Negative Negative   Leukocytes, UA Negative Negative        Assessment & Plan:   Problem List Items  Addressed This Visit       Cardiovascular and Mediastinum   Primary hypertension    Chronic, stable. BP 130/84 today. Continue losartan-hctz 50-12.5mg  daily. Check CMP, CBC. Follow-up in 6 months.       Relevant Orders   Comprehensive metabolic panel     Nervous and Auditory   Cervical myelopathy (HCC)    Chronic, stable. She had a cervical anterior discectomy and fusion of C4-C7 on 10/02/22 and is doing well since her surgery. She denies any pain or ongoing symptoms.       Bilateral carpal tunnel syndrome    She has been experiencing numbness in her fingers intermittently while driving or brushing her teeth. Positive phalen's test on exam. Will have her start stretches daily, use ice as needed, wear a wrist brace at night, and place a referral to orthopedics.       Relevant Medications   methocarbamol (ROBAXIN) 500 MG tablet   Other Relevant Orders   Ambulatory referral to Orthopedic Surgery   Vitamin B12     Musculoskeletal and Integument   Tendonitis    Positive finklestein's test bilaterally. She can continue using voltaren gel as needed. Stretches given to do daily. Referral placed to orthopedics.         Other   Morbid obesity (HCC)    BMI 44.4. She has still been struggling to lose weight even though she is tracking calories and exercising. She has had gastric sleeve in the past, however she is wondering if it may be possible to have the roux-en-y surgery. Will place a referral to bariatric surgery.       Relevant Medications   Dulaglutide (TRULICITY) 1.5 GY/6.9SW SOPN   Other Relevant Orders   Amb Referral to Bariatric Surgery   Prediabetes    With urinary frequency, will check an A1c today. Will also increase her trulicity to 1.5mg  injection weekly. Follow-up in 3 months.       Relevant Orders   Hemoglobin A1c   Other Visit Diagnoses     Urinary frequency    -  Primary   U/A negative. Will check A1c today with history of prediabetes.   Relevant Orders    POCT URINALYSIS DIP (CLINITEK) (Completed)   Need for Td vaccine       Td booster given today   Relevant Orders   Td vaccine greater than  or equal to 7yo preservative free IM (Completed)   Intertrigo       Will have her start nystatin powder TID prn to skin folds. Keep areas dry. Change towel regularly.       Meds ordered this encounter  Medications   Dulaglutide (TRULICITY) 1.5 MG/0.5ML SOPN    Sig: Inject 1.5 mg into the skin once a week.    Dispense:  6 mL    Refill:  0   nystatin (MYCOSTATIN/NYSTOP) powder    Sig: Apply 1 Application topically 3 (three) times daily.    Dispense:  60 g    Refill:  3    Return in about 3 months (around 03/13/2023).  Gerre Scull, NP

## 2022-12-12 NOTE — Assessment & Plan Note (Signed)
BMI 44.4. She has still been struggling to lose weight even though she is tracking calories and exercising. She has had gastric sleeve in the past, however she is wondering if it may be possible to have the roux-en-y surgery. Will place a referral to bariatric surgery.

## 2022-12-12 NOTE — Assessment & Plan Note (Signed)
With urinary frequency, will check an A1c today. Will also increase her trulicity to 1.5mg  injection weekly. Follow-up in 3 months.

## 2022-12-12 NOTE — Assessment & Plan Note (Signed)
Positive finklestein's test bilaterally. She can continue using voltaren gel as needed. Stretches given to do daily. Referral placed to orthopedics.

## 2022-12-13 LAB — COMPREHENSIVE METABOLIC PANEL
AG Ratio: 1.6 (calc) (ref 1.0–2.5)
ALT: 39 U/L — ABNORMAL HIGH (ref 6–29)
AST: 26 U/L (ref 10–35)
Albumin: 4.2 g/dL (ref 3.6–5.1)
Alkaline phosphatase (APISO): 85 U/L (ref 37–153)
BUN: 13 mg/dL (ref 7–25)
CO2: 24 mmol/L (ref 20–32)
Calcium: 9.2 mg/dL (ref 8.6–10.4)
Chloride: 102 mmol/L (ref 98–110)
Creat: 0.68 mg/dL (ref 0.50–1.03)
Globulin: 2.7 g/dL (calc) (ref 1.9–3.7)
Glucose, Bld: 85 mg/dL (ref 65–99)
Potassium: 4.3 mmol/L (ref 3.5–5.3)
Sodium: 139 mmol/L (ref 135–146)
Total Bilirubin: 0.3 mg/dL (ref 0.2–1.2)
Total Protein: 6.9 g/dL (ref 6.1–8.1)

## 2022-12-13 LAB — HEMOGLOBIN A1C
Hgb A1c MFr Bld: 5.5 % of total Hgb (ref <5.7)
Mean Plasma Glucose: 111 mg/dL
eAG (mmol/L): 6.2 mmol/L

## 2022-12-13 LAB — VITAMIN B12: Vitamin B-12: 384 pg/mL (ref 200–1100)

## 2022-12-15 ENCOUNTER — Other Ambulatory Visit (HOSPITAL_COMMUNITY): Payer: Self-pay

## 2022-12-15 ENCOUNTER — Telehealth: Payer: Self-pay | Admitting: Nurse Practitioner

## 2022-12-15 ENCOUNTER — Encounter: Payer: Self-pay | Admitting: Nurse Practitioner

## 2022-12-15 NOTE — Telephone Encounter (Signed)
Pharmacy Patient Advocate Encounter   Received notification from [facility] that prior authorization for Trulicity 1.5MG /0.5ML pen-injectors is required/requested.   PA submitted on 12/15/22 to (ins) [employer] via CoverMyMeds Key [id number]  Status is pending

## 2022-12-15 NOTE — Telephone Encounter (Signed)
Pt needs for PA

## 2022-12-17 NOTE — Telephone Encounter (Signed)
Forwarding to Lauren 

## 2022-12-17 NOTE — Telephone Encounter (Signed)
Pharmacy Patient Advocate Encounter  Received notification from [place] that the request for prior authorization for Trulicity 1.5MG /0.5ML pen-injectors has been denied due to  no indication your patient has type 2 diabetes mellitus.      You may call [phone] or fax  [phone], to appeal.  Please be advised we currently do not have a Pharmacist to review denials. If you would like [place] to submit it on your behalf, please provide clinical information to support your reason for appeal and any pertinent information you would like [place] to include with the appeal request. Appeals may take longer 5 business days to be submitted as we prepares necessary documentation. Thanks for your support.  How would you like to proceed?

## 2022-12-17 NOTE — Telephone Encounter (Signed)
Per denial, Christella Scheuermann does not cover Trulicity for any other indication because it is only approved for type 2 diabetes. Submitted PA for obesity and prediabetes. Also stated that patient has been on Trulicity since 12/2876. First question asked was if patient has a diagnosis of type 2 diabetes.

## 2022-12-17 NOTE — Telephone Encounter (Signed)
Forwarding

## 2022-12-17 NOTE — Telephone Encounter (Signed)
I left a message for the patient to return my call.

## 2022-12-18 NOTE — Telephone Encounter (Signed)
Called informed patient that her insurance  will no longer cover her medicine , asked pt if she will like a referral to healthy weight and wellness. Pt said that she has been to then when they first open , and not wanting to back. Pt wanted know if her referral was sent to [facility] informed patient her was sent and currently being  up for review.

## 2022-12-19 NOTE — Telephone Encounter (Signed)
Noted  

## 2022-12-23 ENCOUNTER — Ambulatory Visit (INDEPENDENT_AMBULATORY_CARE_PROVIDER_SITE_OTHER): Payer: Commercial Managed Care - HMO | Admitting: Sports Medicine

## 2022-12-23 ENCOUNTER — Ambulatory Visit (INDEPENDENT_AMBULATORY_CARE_PROVIDER_SITE_OTHER): Payer: Commercial Managed Care - HMO

## 2022-12-23 ENCOUNTER — Encounter: Payer: Self-pay | Admitting: Sports Medicine

## 2022-12-23 DIAGNOSIS — M79641 Pain in right hand: Secondary | ICD-10-CM

## 2022-12-23 DIAGNOSIS — M79642 Pain in left hand: Secondary | ICD-10-CM

## 2022-12-23 DIAGNOSIS — Z981 Arthrodesis status: Secondary | ICD-10-CM

## 2022-12-23 DIAGNOSIS — Z9884 Bariatric surgery status: Secondary | ICD-10-CM

## 2022-12-23 DIAGNOSIS — M1811 Unilateral primary osteoarthritis of first carpometacarpal joint, right hand: Secondary | ICD-10-CM

## 2022-12-23 DIAGNOSIS — G5603 Carpal tunnel syndrome, bilateral upper limbs: Secondary | ICD-10-CM

## 2022-12-23 MED ORDER — PREGABALIN 75 MG PO CAPS: 75.0000 mg | ORAL_CAPSULE | Freq: Two times a day (BID) | ORAL | 0 refills | Status: DC

## 2022-12-23 NOTE — Progress Notes (Signed)
Bilateral wrist pain; possible carpal tunnel Does complain of numbness/tingling into the hands  She does have a history of neck surgery done by D. Brooks @ [facility]   She has not had any injections for this Does not take OTC medication due to hx of GB surgery and liver issues  She does have braces @ home that she wears @ night  She has not had any x-rays or EMG/NCS done

## 2022-12-23 NOTE — Progress Notes (Signed)
Kim Watson - 52 y.o. female MRN [id number]  Date of birth: 07-27-1971  Office Visit Note: Visit Date: 12/23/2022 PCP: Charyl Dancer, NP Referred by: Charyl Dancer, NP  Subjective: Chief Complaint  Patient presents with   Right Wrist - Pain   Left Wrist - Pain   HPI: Kim Watson is a pleasant 52 y.o. female who presents today for bilateral thumb pain and tingling sensation in bilateral hands.  Has had recent anterior cervical discectomy and fusion from C4-C7 by Arvella Merles back in Novemeber 2023.  Doing much better in terms of neck pain as well as the radicular symptoms are going down the arms.  He has been having burning in her fingers, that is now resolved, but she is still having some numbness and tingling in the third and radial aspect of the fourth digit.  Will need to shake these out in the evening/night to improve symptoms.  Previously had been on Lyrica 75 mg once to twice daily, although currently is off of this.  Having pain over bilateral thumbs, although worse on the right side near the base and the radial wrist.  Will get some grinding sensation with meals.  She is very active with her hands and there is do a lot of typing and computer work for her job.  This irritates both her thumbs as well as the numbness and tingling in the fingers. Pain with gripping.  She does have a history of a gastric sleeve, needs to avoid NSAIDs.  Pertinent ROS were reviewed with the patient and found to be negative unless otherwise specified above in HPI.   Assessment & Plan: Visit Diagnoses:  1. Carpal tunnel syndrome, bilateral   2. Pain in right hand   3. Pain in left hand   4. S/P cervical spinal fusion   5. Osteoarthritis of carpometacarpal (CMC) joint of right thumb, unspecified osteoarthritis type   6. History of bariatric surgery    Plan: Discussed with [place] the nature of her painful conditions.  I do think she is dealing with 2 separate issues, most notably she has  [place] arthritis of the right thumb greater than the left thumb.  She also is having some numbness and tingling indicative of likely carpal tunnel syndrome.  Interestingly enough she did have radicular pain that has improved from her prior C4-C7 ACDF, less likely coming from this residual neural impingement but certainly possible.  This all treatment options such as topical anti-inflammatories, oral medication therapy, bracing, further studies such as nerve conduction, as well as injection therapy.  For her carpal tunnel syndromes, she will purchase over-the-counter cock-up wrist braces that she is to wear at night.  Discussed possible changes with work typing on the computer.  We will get her started back on Lyrica 75 mg x 1 week and then increase to twice daily dosing after that week if tolerating.  We may consider a 6-day taper of oral prednisone for this as well as her [facility] joint pain, although would like to ensure Dr. Melina Schools is ok with this from her prior ACDF first (she will ask at her appt later today).  Continue topical Voltaren gel for the thumbs until that time.  I would like to see what sort of relief she gets from both of these conditions in about 6 weeks, she will then follow-up.  Future treatment options may be injection therapy into the [facility] joint or the carpal tunnel, versus EMG/nerve conduction study to quantify the  degree of her carpal tunnel prior to this.  Follow-up: Return in about 6 weeks (around 02/03/2023) for for CT and thumb pain.   Meds & Orders:  Meds ordered this encounter  Medications   pregabalin (LYRICA) 75 MG capsule    Sig: Take 1 capsule (75 mg total) by mouth 2 (two) times daily.    Dispense:  60 capsule    Refill:  0    Orders Placed This Encounter  Procedures   XR Hand Complete Right   XR Hand Complete Left     Procedures: No procedures performed      Clinical History: No specialty comments available.  She reports that she has never smoked. She has never  used smokeless tobacco.  Recent Labs    01/09/22 1433 07/14/22 1345 12/12/22 1527  HGBA1C 5.6 5.8 5.5    Objective:    Physical Exam  Gen: Well-appearing, in no acute distress; non-toxic CV: Well-perfused. Warm.  Resp: Breathing unlabored on room air; no wheezing. Psych: Fluid speech in conversation; appropriate affect; normal thought process Neuro: Sensation intact throughout. No gross coordination deficits.   Ortho Exam - Bilateral wrists/hands: Positive TTP noted over the right [facility] joint with some mild bony bossing.  Positive CMC grind test.  There is some limitation in flexion extension of the wrist right thumb compared to the left side.  Positive CMC grind test of the left.  Okay sign intact.  Normal range of motion in flexion extension of bilateral wrist.  There is positive Tinel's test at the carpal tunnel right greater than left.  Positive reverse Phalen's test bilaterally.  Imaging: XR Hand Complete Left  Result Date: 12/23/2022 3 views of the left hand including AP, oblique and lateral film were ordered and reviewed by myself.  X-rays demonstrate moderate arthritic change of the [facility] joint.  Otherwise no acute bony fracture or bony abnormality noted.  XR Hand Complete Right  Result Date: 12/23/2022 3 views of the right hand including AP, oblique and lateral femoral ordered and reviewed by myself.  X-rays demonstrate moderate to severe osteoarthritic change with joint space narrowing at the [facility] joint of the thumb, there is some sclerosis noted on the ulnar side of the joint.  Some mild arthritic change throughout the carpal row, although no significant bony abnormality or fracture.   *Independent review of most recent cervical spine x-ray demonstrates expected postsurgical changes of a new C4-C7 ACDF -IV disc spaces are noted.  There is fluoroscopy x-ray. See previous MRI below. Narrative & Impression  CLINICAL DATA:  Anterior cervical discectomy and fusion C4 through C7.  Intraoperative fluoroscopy.   EXAM: CERVICAL SPINE - 2-3 VIEW   COMPARISON:  Cervical spine radiographs 05/16/2022   FINDINGS: Images were performed intraoperatively without the presence of a radiologist. Postsurgical changes are seen of new C4 through C7 ACDF with associated intervertebral disc spacers.   Total fluoroscopy images: 3   Total fluoroscopy time: 33 seconds   Total dose: Radiation Exposure Index (as provided by the fluoroscopic device): 9.63 mGy air Kerma   Please see intraoperative findings for further detail.   IMPRESSION: Intraoperative fluoroscopy provided for C4 through C7 ACDF.     Electronically Signed   By: Yvonne Kendall M.D.   On: 10/02/2022 12:18    Narrative & Impression  CLINICAL DATA:  Chronic neck pain and right arm pain   EXAM: MRI CERVICAL SPINE WITHOUT CONTRAST   TECHNIQUE: Multiplanar, multisequence MR imaging of the cervical spine was performed. No intravenous  contrast was administered.   COMPARISON:  None Available. An MRI of the cervical spine from 2000 not be retrieved for comparison   FINDINGS: Alignment: Straightening of the normal cervical lordosis. No listhesis.   Vertebrae: No acute fracture or suspicious osseous lesion.   Cord: Cord flattening at C4-C5 and C6-C7. Focal increased T2 hyperintense signal in the left lateral cord at C4-C5 (series 6, image 13). Otherwise normal in morphology and signal.   Posterior Fossa, vertebral arteries, paraspinal tissues: Negative.   Disc levels:   C2-C3: Mild disc bulge with left foraminal protrusion. No spinal canal stenosis or neural foraminal narrowing.   C3-C4: No significant disc bulge. Left facet arthropathy. No spinal canal stenosis. Mild left neural foraminal narrowing.   C4-C5: Disc osteophyte complex. Facet and uncovertebral hypertrophy. Severe spinal canal stenosis with cord flattening. Severe right and moderate left neural foraminal narrowing.   C5-C6: Disc  osteophyte complex with right foraminal protrusion. Right-greater-than-left facet and uncovertebral hypertrophy. Moderate spinal canal stenosis. Moderate to severe right neural foraminal narrowing.   C6-C7: Disc osteophyte complex. Facet and uncovertebral hypertrophy. Moderate spinal canal stenosis with cord flattening. Severe right and moderate left neural foraminal narrowing.   C7-T1: Minimal disc bulge with left foraminal protrusion. Right-greater-than-left facet arthropathy. No spinal canal stenosis. Moderate right-greater-than-left neural foraminal narrowing.   IMPRESSION: 1. C4-C5 severe spinal canal stenosis with cord flattening. Focal T2 hyperintense signal in the left lateral cord at this level, likely compressive myelopathy. In addition there is severe right and moderate left neural foraminal narrowing. 2. C6-C7 moderate spinal canal stenosis cord flattening with severe right and moderate left neural foraminal narrowing. 3. C5-C6 moderate spinal canal stenosis and moderate to severe right neural foraminal narrowing. 4. C7-T1 moderate right-greater-than-left neural foraminal narrowing.   These results will be called to the ordering clinician or representative by the Radiologist Assistant, and communication documented in the PACS or [employer].     Electronically Signed   By: Merilyn Baba M.D.   On: 08/09/2022 03:35    Past Medical/Family/Surgical/Social History: Medications & Allergies reviewed per EMR, new medications updated. 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   Primary hypertension 05/07/2022   Neck pain 05/07/2022   Acute left-sided low back pain without sciatica  02/24/2022   Bursitis of left shoulder 01/10/2022   Dry mouth 01/10/2022   History of bariatric surgery 01/10/2022   Elevated blood pressure reading 01/10/2022   Anxiety and depression 01/10/2022   Gastroesophageal reflux disease 01/10/2022   Prediabetes 07/13/2017   At risk for diabetes mellitus 07/02/2017   Insulin resistance 07/02/2017   Vitamin D deficiency 07/02/2017   Other hyperlipidemia 07/02/2017   Morbid obesity ([place]) 06/18/2017   Past Medical History:  Diagnosis Date   Anxiety    Arthritis    Depression    Gallbladder problem    GERD (gastroesophageal reflux disease)    High blood pressure    Hypertension    Migraine    Obesity    Obstructive sleep apnea    2016 was cleared from having to use CPAP at night. Gastric bypass surgery 2015.   Vitamin D deficiency    Family History  Problem Relation Age of Onset   Diabetes Maternal Grandmother  Heart disease Maternal Grandmother    Diabetes Other    Hyperlipidemia Other    Hypertension Other    Past Surgical History:  Procedure Laterality Date   ANTERIOR CERVICAL DECOMP/DISCECTOMY FUSION N/A 10/02/2022   Procedure: ANTERIOR CERVICAL DISCECTOMY AND FUSION CERVICAL FOUR TO SEVEN;  Surgeon: Venita Lick, MD;  Location: [facility] OR;  Service: Orthopedics;  Laterality: N/A;  4hrs 3 C-Bed   CHOLECYSTECTOMY     FOOT SURGERY Left 07/26/2022   5th metatarsal   SLEEVE GASTROPLASTY     TUBAL LIGATION     Social History   Occupational History   Occupation: Waitress  Tobacco Use   Smoking status: Never   Smokeless tobacco: Never  Vaping Use   Vaping Use: Never used  Substance and Sexual Activity   Alcohol use: Not Currently    Comment: socially   Drug use: No   Sexual activity: Yes    Birth control/protection: Surgical

## 2023-01-05 ENCOUNTER — Other Ambulatory Visit: Payer: Self-pay | Admitting: Nurse Practitioner

## 2023-01-06 ENCOUNTER — Encounter: Payer: Self-pay | Admitting: Nurse Practitioner

## 2023-01-06 NOTE — Telephone Encounter (Signed)
Last refill 07-14-22 Last OV 12-12-22 No FOV

## 2023-01-08 ENCOUNTER — Encounter: Payer: Self-pay | Admitting: Sports Medicine

## 2023-01-08 ENCOUNTER — Ambulatory Visit (INDEPENDENT_AMBULATORY_CARE_PROVIDER_SITE_OTHER): Payer: Commercial Managed Care - HMO | Admitting: Sports Medicine

## 2023-01-08 DIAGNOSIS — G5603 Carpal tunnel syndrome, bilateral upper limbs: Secondary | ICD-10-CM

## 2023-01-08 DIAGNOSIS — M79641 Pain in right hand: Secondary | ICD-10-CM | POA: Diagnosis not present

## 2023-01-08 DIAGNOSIS — M1811 Unilateral primary osteoarthritis of first carpometacarpal joint, right hand: Secondary | ICD-10-CM

## 2023-01-08 DIAGNOSIS — M79644 Pain in right finger(s): Secondary | ICD-10-CM

## 2023-01-08 DIAGNOSIS — G8929 Other chronic pain: Secondary | ICD-10-CM

## 2023-01-08 DIAGNOSIS — Z981 Arthrodesis status: Secondary | ICD-10-CM

## 2023-01-08 DIAGNOSIS — M79645 Pain in left finger(s): Secondary | ICD-10-CM

## 2023-01-08 MED ORDER — BETAMETHASONE SOD PHOS & ACET 6 (3-3) MG/ML IJ SUSP
6.0000 mg | INTRAMUSCULAR | Status: AC | PRN
  Administered 2023-01-08: 6 mg via INTRA_ARTICULAR

## 2023-01-08 MED ORDER — LIDOCAINE HCL 1 % IJ SOLN
0.5000 mL | INTRAMUSCULAR | Status: AC | PRN
  Administered 2023-01-08: .5 mL

## 2023-01-08 NOTE — Progress Notes (Signed)
Kim Watson - 52 y.o. female MRN [id number]  Date of birth: Jul 09, 1971  Office Visit Note: Visit Date: 01/08/2023 PCP: Kim Dancer, NP Referred by: Kim Dancer, NP  Subjective: Chief Complaint  Patient presents with   Right Hand - Follow-up   Left Hand - Follow-up   HPI: Kim Watson is a pleasant 52 y.o. female who presents today for follow-up of bilateral carpal tunnel/wrist pain and bilateral CMC joint pain.  Had anterior cervical discectomy and fusion from C4-C7 by Kim Watson back in Novemeber 2023. Noticed following this her neck and shoulder pain got better, but she is having burning and tingling pain in the third and fourth digit bilaterally.  Feels she had this before, but noticed it more after her neck pain got better.  Continues on Lyrica, went from 75 mg once to twice daily.  She will have to shake out her fingers at nighttime.  The pain is so bothersome that this is waking her up at night.  Bilateral thumb pain, right worse than left.  This pain has been worsening.  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  She has pain in fingers like she will drop items when she has to grip them because of her thumb pain.  She did recently finish the methylprednisolone course that Kim Watson gave her, this gave her at least 50% improvement in her ailments. Cannot take NSAIDs given history of gastric sleeve.   Pertinent ROS were reviewed with the patient and found to be negative unless otherwise specified above in HPI.   Assessment & Plan: Visit Diagnoses:  1. Carpal tunnel syndrome, bilateral   2. Pain in right hand   3. Chronic thumb pain, bilateral   4. Osteoarthritis of carpometacarpal (CMC) joint of right thumb, unspecified osteoarthritis type   5. S/P cervical spinal fusion    Plan: Discussed with Kim Watson all of her conditions of the wrist and thumbs.  She does seem to have classic symptoms of bilateral carpal tunnel.  Her case is slightly more  complicated given that she had a prior C4-C7 ACDF a few months ago.  Numbness and tingling started more so after this, although she was unsure if her symptoms were masked given her prior neck pain that was relieved from the fusion.  Given this, I would like to send her to my partner, Kim Watson, to undergo bilateral upper extremity EMG to ensure this is coming from median nerve entrapment and also to grade the severity.  Kim Watson also has bilateral CMC pain, with right CMC osteoarthritis worse than the left.  This is interfering with her activities of daily living.  Through shared decision-making, did elect to proceed with Kim Watson joint injection on the right.  Discussed work modifications as able.  She will continue her cock-up wrist braces at nighttime for carpal tunnel, did show her over-the-counter CMC cool comfort wrist brace for the right thumb.  I would like her to continue the twice daily dosing of her Lyrica.  She is to take 75 mg twice daily.  She is also on Cymbalta 60 mg for anxiety, she may continue this.  Recommend discontinuation of Robaxin  Follow-up: F/u after EMG results   Meds & Orders: No orders of the defined types were placed in this encounter.   Orders Placed This Encounter  Procedures   Small Joint Inj   Ambulatory referral to Physical Medicine Rehab     Procedures: Small Joint Inj: R thumb CMC  on 01/08/2023 2:35 PM Indications: pain Details: 25 G needle, dorsal approach Medications: 0.5 mL lidocaine 1 %; 6 mg betamethasone acetate-betamethasone sodium phosphate 6 (3-3) MG/ML Outcome: tolerated well, no immediate complications  Procedure: CMC joint injection, Right thumb After informed verbal consent was obtained a timeout was performed, patient was seated on exam table. The patient's right hand was placed on stable surface with medial hand on table.  Identification of the Kim Watson joint was performed with palpation. A 25-gauge, 1" needle was inserted into the [facility] joint. The joint was  subsequently injected with a 0.5:1.0 cc of lidocaine:betamethasone combination.  Patient tolerated the procedure well without immediate complications, band-aid was applied.  Procedure, treatment alternatives, risks and benefits explained, specific risks discussed. Consent was given by the patient. Immediately prior to procedure a time out was called to verify the correct patient, procedure, equipment, support staff and site/side marked as required. Patient was prepped and draped in the usual sterile fashion.          Clinical History: No specialty comments available.  She reports that she has never smoked. She has never used smokeless tobacco.  Recent Labs    01/09/22 1433 07/14/22 1345 12/12/22 1527  HGBA1C 5.6 5.8 5.5    Objective:    Physical Exam  Gen: Well-appearing, in no acute distress; non-toxic CV: Well-perfused. Warm.  Resp: Breathing unlabored on room air; no wheezing. Psych: Fluid speech in conversation; appropriate affect; normal thought process Neuro: Sensation intact throughout. No gross coordination deficits.   Ortho Exam - Bilateral wrists: There is some very mild soft tissue swelling over the volar aspect of the wrist and into the fingers.  Small irritation from her ring.  Positive Tinel's test over the right carpal tunnel greater than left, positive reverse Phalen's test bilaterally.  Okay sign intact.  Full range of motion in flexion and extension of bilateral wrists.  - Bilateral thumbs: Positive TTP noted over the [facility] joint, right greater than left with the right side having some bony bossing.  Positive CMC grind test. Localized swelling without redness or warmth.  Imaging:  XR Hand Complete Left 3 views of the left hand including AP, oblique and lateral film were  ordered and reviewed by myself.  X-rays demonstrate moderate arthritic  change of the [facility] joint.  Otherwise no acute bony fracture or bony  abnormality noted. XR Hand Complete Right 3 views of  the right hand including AP, oblique and lateral femoral  ordered and reviewed by myself.  X-rays demonstrate moderate to severe  osteoarthritic change with joint space narrowing at the [facility] joint of the  thumb, there is some sclerosis noted on the ulnar side of the joint.  Some  mild arthritic change throughout the carpal row, although no significant  bony abnormality or fracture.    Past Medical/Family/Surgical/Social History: Medications & Allergies reviewed per EMR, new medications updated. Patient Active Problem List   Diagnosis Date Noted   Bilateral carpal tunnel syndrome 12/12/2022   Tendonitis 12/12/2022   Cervical myelopathy ([place]) 10/02/2022   Preop examination 09/16/2022   COVID-19 08/20/2022   Acute nonintractable headache 08/20/2022   Pain in right shoulder 06/27/2022   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/07/2022   Primary hypertension 05/07/2022   Neck pain 05/07/2022   Acute left-sided low back pain without sciatica 02/24/2022   Bursitis of left shoulder 01/10/2022   Dry mouth 01/10/2022   History of bariatric surgery 01/10/2022   Elevated blood pressure reading 01/10/2022   Anxiety and depression 01/10/2022  Gastroesophageal reflux disease 01/10/2022   Prediabetes 07/13/2017   At risk for diabetes mellitus 07/02/2017   Insulin resistance 07/02/2017   Vitamin D deficiency 07/02/2017   Other hyperlipidemia 07/02/2017   Morbid obesity ([place]) 06/18/2017   Past Medical History:  Diagnosis Date   Anxiety    Arthritis    Depression    Gallbladder problem    GERD (gastroesophageal reflux disease)    High blood pressure    Hypertension    Migraine    Obesity    Obstructive sleep apnea    2016 was cleared from having to use CPAP at night. Gastric bypass surgery 2015.   Vitamin D deficiency    Family History  Problem Relation Age of Onset   Diabetes Maternal Grandmother    Heart disease Maternal Grandmother    Diabetes Other    Hyperlipidemia Other    Hypertension  Other    Past Surgical History:  Procedure Laterality Date   ANTERIOR CERVICAL DECOMP/DISCECTOMY FUSION N/A 10/02/2022   Procedure: ANTERIOR CERVICAL DISCECTOMY AND FUSION CERVICAL FOUR TO SEVEN;  Surgeon: Melina Schools, MD;  Location: [place];  Service: Orthopedics;  Laterality: N/A;  4hrs 3 C-Bed   CHOLECYSTECTOMY     FOOT SURGERY Left 07/26/2022   5th metatarsal   SLEEVE GASTROPLASTY     TUBAL LIGATION     Social History   Occupational History   Occupation: Waitress  Tobacco Use   Smoking status: Never   Smokeless tobacco: Never  Vaping Use   Vaping Use: Never used  Substance and Sexual Activity   Alcohol use: Not Currently    Comment: socially   Drug use: No   Sexual activity: Yes    Birth control/protection: Surgical

## 2023-01-08 NOTE — Progress Notes (Signed)
In a lot of pain  Taking lyrica 2x/day Also wearing the braces  Nothing is helping with the pain/tingling

## 2023-01-16 ENCOUNTER — Ambulatory Visit (INDEPENDENT_AMBULATORY_CARE_PROVIDER_SITE_OTHER): Payer: Commercial Managed Care - HMO | Admitting: Physical Medicine and Rehabilitation

## 2023-01-16 DIAGNOSIS — M961 Postlaminectomy syndrome, not elsewhere classified: Secondary | ICD-10-CM | POA: Diagnosis not present

## 2023-01-16 DIAGNOSIS — M79641 Pain in right hand: Secondary | ICD-10-CM

## 2023-01-16 DIAGNOSIS — M79642 Pain in left hand: Secondary | ICD-10-CM

## 2023-01-16 DIAGNOSIS — R531 Weakness: Secondary | ICD-10-CM

## 2023-01-16 DIAGNOSIS — M542 Cervicalgia: Secondary | ICD-10-CM

## 2023-01-16 DIAGNOSIS — R202 Paresthesia of skin: Secondary | ICD-10-CM

## 2023-01-16 NOTE — Progress Notes (Signed)
Functional Pain Scale - descriptive words and definitions  Moderate (4)   Constantly aware of pain, can complete ADLs with modification/sleep marginally affected at times/passive distraction is of no use, but active distraction gives some relief. Moderate range order  Average Pain 4  Pain, numbness and burning in both hands. Pain in lower part of palms near thumbs. Numbness in middle and ring fingers on both hands. Both hands stay cold

## 2023-01-21 NOTE — Procedures (Signed)
EMG & NCV Findings: Evaluation of the left median motor and the right median motor nerves showed prolonged distal onset latency (L5.0, R5.3 ms) and decreased conduction velocity (Elbow-Wrist, L49, R46 m/s).  The left median (across palm) sensory nerve showed no response (Palm) and prolonged distal peak latency (5.4 ms).  The right median (across palm) sensory nerve showed prolonged distal peak latency (Wrist, 5.0 ms) and prolonged distal peak latency (Palm, 5.6 ms).  All remaining nerves (as indicated in the following tables) were within normal limits.  All left vs. right side differences were within normal limits.    All examined muscles (as indicated in the following table) showed no evidence of electrical instability.    Impression: The above electrodiagnostic study is ABNORMAL and reveals evidence of a moderate bilateral median nerve entrapment at the wrist (carpal tunnel syndrome) affecting sensory and motor components. There is no significant electrodiagnostic evidence of any other focal nerve entrapment, brachial plexopathy or cervical radiculopathy.   Recommendations: 1.  Follow-up with referring physician. 2.  Continue current management of symptoms. 3.  Continue use of resting splint at night-time and as needed during the day. 4.  Suggest surgical evaluation.  ___________________________ Laurence Spates FAAPMR Board Certified, American Board of Physical Medicine and Rehabilitation    Nerve Conduction Studies Anti Sensory Summary Table   Stim Site NR Peak (ms) Norm Peak (ms) P-T Amp (V) Norm P-T Amp Site1 Site2 Delta-P (ms) Dist (cm) Vel (m/s) Norm Vel (m/s)  Left Median Acr Palm Anti Sensory (2nd Digit)  31C  Wrist    *5.4 <3.6 13.5 >10 Wrist Palm  0.0    Palm *NR  <2.0          Right Median Acr Palm Anti Sensory (2nd Digit)  28.8C  Wrist    *5.0 <3.6 15.7 >10 Wrist Palm 0.6 0.0    Palm    *5.6 <2.0 3.5         Right Radial Anti Sensory (Base 1st Digit)  30.2C  Wrist    2.0  <3.1 21.8  Wrist Base 1st Digit 2.0 0.0    Left Ulnar Anti Sensory (5th Digit)  31.2C  Wrist    3.3 <3.7 29.2 >15.0 Wrist 5th Digit 3.3 14.0 42 >38  Right Ulnar Anti Sensory (5th Digit)  30C  Wrist    3.0 <3.7 20.1 >15.0 Wrist 5th Digit 3.0 14.0 47 >38   Motor Summary Table   Stim Site NR Onset (ms) Norm Onset (ms) O-P Amp (mV) Norm O-P Amp Site1 Site2 Delta-0 (ms) Dist (cm) Vel (m/s) Norm Vel (m/s)  Left Median Motor (Abd Poll Brev)  30.8C  Wrist    *5.0 <4.2 8.2 >5 Elbow Wrist 4.2 20.5 *49 >50  Elbow    9.2  7.9         Right Median Motor (Abd Poll Brev)  30.6C  Wrist    *5.3 <4.2 7.4 >5 Elbow Wrist 4.1 19.0 *46 >50  Elbow    9.4  7.3         Left Ulnar Motor (Abd Dig Min)  30.9C  Wrist    2.8 <4.2 8.5 >3 B Elbow Wrist 3.1 19.0 61 >53  B Elbow    5.9  8.1  A Elbow B Elbow 1.4 10.0 71 >53  A Elbow    7.3  7.5         Right Ulnar Motor (Abd Dig Min)  31C  Wrist    2.9 <4.2 9.4 >3 B Elbow  Wrist 3.0 19.5 65 >53  B Elbow    5.9  9.1  A Elbow B Elbow 1.3 10.0 77 >53  A Elbow    7.2  7.1          EMG   Side Muscle Nerve Root Ins Act Fibs Psw Amp Dur Poly Recrt Int Fraser Din Comment  Right Abd Poll Brev Median C8-T1 Nml Nml Nml Nml Nml 0 Nml Nml   Right 1stDorInt Ulnar C8-T1 Nml Nml Nml Nml Nml 0 Nml Nml   Right PronatorTeres Median C6-7 Nml Nml Nml Nml Nml 0 Nml Nml   Right Biceps Musculocut C5-6 Nml Nml Nml Nml Nml 0 Nml Nml   Right Deltoid Axillary C5-6 Nml Nml Nml Nml Nml 0 Nml Nml     Nerve Conduction Studies Anti Sensory Left/Right Comparison   Stim Site L Lat (ms) R Lat (ms) L-R Lat (ms) L Amp (V) R Amp (V) L-R Amp (%) Site1 Site2 L Vel (m/s) R Vel (m/s) L-R Vel (m/s)  Median Acr Palm Anti Sensory (2nd Digit)  31C  Wrist *5.4 *5.0 0.4 13.5 15.7 14.0 Wrist Palm     Palm  *5.6   3.5        Radial Anti Sensory (Base 1st Digit)  30.2C  Wrist  2.0   21.8  Wrist Base 1st Digit     Ulnar Anti Sensory (5th Digit)  31.2C  Wrist 3.3 3.0 0.3 29.2 20.1 31.2 Wrist 5th Digit 42 47  5   Motor Left/Right Comparison   Stim Site L Lat (ms) R Lat (ms) L-R Lat (ms) L Amp (mV) R Amp (mV) L-R Amp (%) Site1 Site2 L Vel (m/s) R Vel (m/s) L-R Vel (m/s)  Median Motor (Abd Poll Brev)  30.8C  Wrist *5.0 *5.3 0.3 8.2 7.4 9.8 Elbow Wrist *49 *46 3  Elbow 9.2 9.4 0.2 7.9 7.3 7.6       Ulnar Motor (Abd Dig Min)  30.9C  Wrist 2.8 2.9 0.1 8.5 9.4 9.6 B Elbow Wrist 61 65 4  B Elbow 5.9 5.9 0.0 8.1 9.1 11.0 A Elbow B Elbow 71 77 6  A Elbow 7.3 7.2 0.1 7.5 7.1 5.3          Waveforms:

## 2023-01-25 ENCOUNTER — Other Ambulatory Visit: Payer: Self-pay | Admitting: Nurse Practitioner

## 2023-01-26 ENCOUNTER — Ambulatory Visit
Admission: RE | Admit: 2023-01-26 | Discharge: 2023-01-26 | Disposition: A | Discharge status: Home / Self Care | Payer: Commercial Managed Care - HMO | Source: Ambulatory Visit | Attending: Nurse Practitioner | Admitting: Nurse Practitioner

## 2023-01-26 ENCOUNTER — Encounter: Payer: Self-pay | Admitting: Physical Medicine and Rehabilitation

## 2023-01-26 DIAGNOSIS — Z1231 Encounter for screening mammogram for malignant neoplasm of breast: Secondary | ICD-10-CM

## 2023-01-26 NOTE — Progress Notes (Signed)
JAIA HIGNIGHT - 52 y.o. female MRN [id number]  Date of birth: 05/24/1971  Office Visit Note: Visit Date: 01/16/2023 PCP: Charyl Dancer, NP Referred by: Elba Barman, DO  Subjective: Chief Complaint  Patient presents with   Right Hand - Numbness, Pain   Left Hand - Numbness, Pain   HPI: Kim Watson is a 52 y.o. female who comes in today at the request of Dr. Elba Barman for evaluation and management of chronic, worsening and severe pain, numbness and tingling in the Bilateral upper extremities.  Patient is Right hand dominant.  Clinically she has a fairly complex story.  She was originally evaluated in our office by Dr. Basil Dess in 2023 around August.  Was having a lot of left shoulder pain and neck pain.  MRI of the cervical spine and shoulder were performed.  This showed moderate tendinosis of the rotator cuff tendons but severe C4-5 canal stenosis with myelopathic changes in the cord as well as moderate stenosis at 2 levels below this.  She went on to have anterior cervical discectomy and fusion from C4-C7 by Dr. Melina Schools at [facility] in November 2023.  She reports that the surgery did give her a lot of relief of her neck pain and shoulder pain.  Since this time though she has had bilateral hand pain and numbness and tingling.  She endorses that she has some of the symptoms prior to the surgery but these were mild compared to the shoulder and neck pain she was having.  Her pain is really at the base of the thumbs and CMC joints.  Dr. Elba Barman is looked at her [facility] joints and she does have arthritic changes.  We started her on Lyrica and it provide wrist splints.  She also gets paresthesias into the middle and ring fingers more of a C7 distribution versus median nerve distribution.  She has had Medrol dose pack and Lyrica which she continues to receive through Dr. Melina Schools.  In terms of her hand symptoms she does endorse a positive flick sign.  This does interfere with her  job.  She has a lot of thumb pain and grip strength pain.  She has had no prior electrodiagnostic studies.  She has not had postsurgical MRI.        Review of Systems  Musculoskeletal:  Positive for joint pain.  Neurological:  Positive for tingling and weakness.  All other systems reviewed and are negative.  Otherwise per HPI.  Assessment & Plan: Visit Diagnoses:    ICD-10-CM   1. Paresthesia of skin  R20.2 NCV with EMG (electromyography)    2. Cervicalgia  M54.2     3. Post laminectomy syndrome  M96.1     4. Bilateral hand pain  M79.641    M79.642     5. Weakness  R53.1        Plan: Impression: Clinically she seems to have carpal tunnel syndrome mixed with CMC joint arthritis.  Complicating feature is the history of severe cervical stenosis with myelopathy.  Electrodiagnostic study performed today.  Central nervous system pathology such as myelopathy may not show up on this particular study.  The above electrodiagnostic study is ABNORMAL and reveals evidence of a moderate bilateral median nerve entrapment at the wrist (carpal tunnel syndrome) affecting sensory and motor components.   There is no significant electrodiagnostic evidence of any other focal nerve entrapment, brachial plexopathy or cervical radiculopathy.   Recommendations: 1.  Follow-up with referring physician. 2.  Continue current management of symptoms. 3.  Continue use of resting splint at night-time and as needed during the day. 4.  Suggest surgical evaluation.  Meds & Orders: No orders of the defined types were placed in this encounter.   Orders Placed This Encounter  Procedures   NCV with EMG (electromyography)    Follow-up: Return for Elba Barman, DO.   Procedures: No procedures performed  EMG & NCV Findings: Evaluation of the left median motor and the right median motor nerves showed prolonged distal onset latency (L5.0, R5.3 ms) and decreased conduction velocity (Elbow-Wrist, L49, R46 m/s).   The left median (across palm) sensory nerve showed no response (Palm) and prolonged distal peak latency (5.4 ms).  The right median (across palm) sensory nerve showed prolonged distal peak latency (Wrist, 5.0 ms) and prolonged distal peak latency (Palm, 5.6 ms).  All remaining nerves (as indicated in the following tables) were within normal limits.  All left vs. right side differences were within normal limits.    All examined muscles (as indicated in the following table) showed no evidence of electrical instability.    Impression: The above electrodiagnostic study is ABNORMAL and reveals evidence of a moderate bilateral median nerve entrapment at the wrist (carpal tunnel syndrome) affecting sensory and motor components.   There is no significant electrodiagnostic evidence of any other focal nerve entrapment, brachial plexopathy or cervical radiculopathy.   Recommendations: 1.  Follow-up with referring physician. 2.  Continue current management of symptoms. 3.  Continue use of resting splint at night-time and as needed during the day. 4.  Suggest surgical evaluation.  ___________________________ Laurence Spates FAAPMR Board Certified, American Board of Physical Medicine and Rehabilitation    Nerve Conduction Studies Anti Sensory Summary Table   Stim Site NR Peak (ms) Norm Peak (ms) P-T Amp (V) Norm P-T Amp Site1 Site2 Delta-P (ms) Dist (cm) Vel (m/s) Norm Vel (m/s)  Left Median Acr Palm Anti Sensory (2nd Digit)  31C  Wrist    *5.4 <3.6 13.5 >10 Wrist Palm  0.0    Palm *NR  <2.0          Right Median Acr Palm Anti Sensory (2nd Digit)  28.8C  Wrist    *5.0 <3.6 15.7 >10 Wrist Palm 0.6 0.0    Palm    *5.6 <2.0 3.5         Right Radial Anti Sensory (Base 1st Digit)  30.2C  Wrist    2.0 <3.1 21.8  Wrist Base 1st Digit 2.0 0.0    Left Ulnar Anti Sensory (5th Digit)  31.2C  Wrist    3.3 <3.7 29.2 >15.0 Wrist 5th Digit 3.3 14.0 42 >38  Right Ulnar Anti Sensory (5th Digit)  30C  Wrist     3.0 <3.7 20.1 >15.0 Wrist 5th Digit 3.0 14.0 47 >38   Motor Summary Table   Stim Site NR Onset (ms) Norm Onset (ms) O-P Amp (mV) Norm O-P Amp Site1 Site2 Delta-0 (ms) Dist (cm) Vel (m/s) Norm Vel (m/s)  Left Median Motor (Abd Poll Brev)  30.8C  Wrist    *5.0 <4.2 8.2 >5 Elbow Wrist 4.2 20.5 *49 >50  Elbow    9.2  7.9         Right Median Motor (Abd Poll Brev)  30.6C  Wrist    *5.3 <4.2 7.4 >5 Elbow Wrist 4.1 19.0 *46 >50  Elbow    9.4  7.3         Left Ulnar Motor (Abd Dig  Min)  30.9C  Wrist    2.8 <4.2 8.5 >3 B Elbow Wrist 3.1 19.0 61 >53  B Elbow    5.9  8.1  A Elbow B Elbow 1.4 10.0 71 >53  A Elbow    7.3  7.5         Right Ulnar Motor (Abd Dig Min)  31C  Wrist    2.9 <4.2 9.4 >3 B Elbow Wrist 3.0 19.5 65 >53  B Elbow    5.9  9.1  A Elbow B Elbow 1.3 10.0 77 >53  A Elbow    7.2  7.1          EMG   Side Muscle Nerve Root Ins Act Fibs Psw Amp Dur Poly Recrt Int Fraser Din Comment  Right Abd Poll Brev Median C8-T1 Nml Nml Nml Nml Nml 0 Nml Nml   Right 1stDorInt Ulnar C8-T1 Nml Nml Nml Nml Nml 0 Nml Nml   Right PronatorTeres Median C6-7 Nml Nml Nml Nml Nml 0 Nml Nml   Right Biceps Musculocut C5-6 Nml Nml Nml Nml Nml 0 Nml Nml   Right Deltoid Axillary C5-6 Nml Nml Nml Nml Nml 0 Nml Nml     Nerve Conduction Studies Anti Sensory Left/Right Comparison   Stim Site L Lat (ms) R Lat (ms) L-R Lat (ms) L Amp (V) R Amp (V) L-R Amp (%) Site1 Site2 L Vel (m/s) R Vel (m/s) L-R Vel (m/s)  Median Acr Palm Anti Sensory (2nd Digit)  31C  Wrist *5.4 *5.0 0.4 13.5 15.7 14.0 Wrist Palm     Palm  *5.6   3.5        Radial Anti Sensory (Base 1st Digit)  30.2C  Wrist  2.0   21.8  Wrist Base 1st Digit     Ulnar Anti Sensory (5th Digit)  31.2C  Wrist 3.3 3.0 0.3 29.2 20.1 31.2 Wrist 5th Digit 42 47 5   Motor Left/Right Comparison   Stim Site L Lat (ms) R Lat (ms) L-R Lat (ms) L Amp (mV) R Amp (mV) L-R Amp (%) Site1 Site2 L Vel (m/s) R Vel (m/s) L-R Vel (m/s)  Median Motor (Abd Poll Brev)  30.8C   Wrist *5.0 *5.3 0.3 8.2 7.4 9.8 Elbow Wrist *49 *46 3  Elbow 9.2 9.4 0.2 7.9 7.3 7.6       Ulnar Motor (Abd Dig Min)  30.9C  Wrist 2.8 2.9 0.1 8.5 9.4 9.6 B Elbow Wrist 61 65 4  B Elbow 5.9 5.9 0.0 8.1 9.1 11.0 A Elbow B Elbow 71 77 6  A Elbow 7.3 7.2 0.1 7.5 7.1 5.3          Waveforms:                   Clinical History: No specialty comments available.   She reports that she has never smoked. She has never used smokeless tobacco.  Recent Labs    07/14/22 1345 12/12/22 1527  HGBA1C 5.8 5.5    Objective:  VS:  HT:    WT:   BMI:     BP:   HR: bpm  TEMP: ( )  RESP:  Physical Exam Vitals and nursing note reviewed.  Constitutional:      General: She is not in acute distress.    Appearance: Normal appearance. She is well-developed. She is not ill-appearing.  HENT:     Head: Normocephalic and atraumatic.  Eyes:     Conjunctiva/sclera: Conjunctivae normal.  Pupils: Pupils are equal, round, and reactive to light.  Cardiovascular:     Rate and Rhythm: Normal rate.     Pulses: Normal pulses.  Pulmonary:     Effort: Pulmonary effort is normal.  Musculoskeletal:        General: No swelling or deformity.     Cervical back: Normal range of motion. Tenderness present.     Right lower leg: No edema.     Left lower leg: No edema.     Comments: Inspection reveals no atrophy of the bilateral APB or FDI or hand intrinsics. There is no swelling, color changes, allodynia or dystrophic changes. There is 5 out of 5 strength in the bilateral wrist extension, finger abduction and long finger flexion. There is intact sensation to light touch in all dermatomal and peripheral nerve distributions. There is a positive Tinel's test at the bilateral wrist and elbow. There is a positive Phalen's test bilaterally. There is a negative Hoffmann's test bilaterally.  Skin:    General: Skin is warm and dry.     Findings: No erythema or rash.  Neurological:     General: No focal deficit  present.     Mental Status: She is alert and oriented to person, place, and time.     Cranial Nerves: No cranial nerve deficit.     Sensory: No sensory deficit.     Motor: No weakness or abnormal muscle tone.     Coordination: Coordination normal.     Gait: Gait normal.  Psychiatric:        Mood and Affect: Mood normal.        Behavior: Behavior normal.     Ortho Exam  Imaging: No results found.  Past Medical/Family/Surgical/Social History: Medications & Allergies reviewed per EMR, new medications updated. Patient Active Problem List   Diagnosis Date Noted   Bilateral carpal tunnel syndrome 12/12/2022   Tendonitis 12/12/2022   Cervical myelopathy ([place]) 10/02/2022   Preop examination 09/16/2022   COVID-19 08/20/2022   Acute nonintractable headache 08/20/2022   Pain in right shoulder 06/27/2022   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/07/2022   Primary hypertension 05/07/2022   Neck pain 05/07/2022   Acute left-sided low back pain without sciatica 02/24/2022   Bursitis of left shoulder 01/10/2022   Dry mouth 01/10/2022   History of bariatric surgery 01/10/2022   Elevated blood pressure reading 01/10/2022   Anxiety and depression 01/10/2022   Gastroesophageal reflux disease 01/10/2022   Chronic migraine without aura without status migrainosus, not intractable 08/31/2018   Prediabetes 07/13/2017   At risk for diabetes mellitus 07/02/2017   Insulin resistance 07/02/2017   Vitamin D deficiency 07/02/2017   Other hyperlipidemia 07/02/2017   Morbid obesity ([place]) 06/18/2017   Chronic pain of both shoulders 11/02/2014   Arthritis of both knees 10/04/2014   Past Medical History:  Diagnosis Date   Anxiety    Arthritis    Depression    Gallbladder problem    GERD (gastroesophageal reflux disease)    High blood pressure    Hypertension    Migraine    Obesity    Obstructive sleep apnea    2016 was cleared from having to use CPAP at night. Gastric bypass surgery 2015.   Vitamin D  deficiency    Family History  Problem Relation Age of Onset   Diabetes Maternal Grandmother    Heart disease Maternal Grandmother    Diabetes Other    Hyperlipidemia Other    Hypertension Other    Past Surgical  History:  Procedure Laterality Date   ANTERIOR CERVICAL DECOMP/DISCECTOMY FUSION N/A 10/02/2022   Procedure: ANTERIOR CERVICAL DISCECTOMY AND FUSION CERVICAL FOUR TO SEVEN;  Surgeon: Melina Schools, MD;  Location: [facility];  Service: Orthopedics;  Laterality: N/A;  4hrs 3 C-Bed   CHOLECYSTECTOMY     FOOT SURGERY Left 07/26/2022   5th metatarsal   SLEEVE GASTROPLASTY     TUBAL LIGATION     Social History   Occupational History   Occupation: Waitress  Tobacco Use   Smoking status: Never   Smokeless tobacco: Never  Vaping Use   Vaping Use: Never used  Substance and Sexual Activity   Alcohol use: Not Currently    Comment: socially   Drug use: No   Sexual activity: Yes    Birth control/protection: Surgical

## 2023-02-01 ENCOUNTER — Other Ambulatory Visit: Payer: Self-pay | Admitting: Nurse Practitioner

## 2023-02-02 ENCOUNTER — Other Ambulatory Visit: Payer: Self-pay | Admitting: Nurse Practitioner

## 2023-02-02 ENCOUNTER — Other Ambulatory Visit: Payer: Self-pay | Admitting: Sports Medicine

## 2023-02-02 DIAGNOSIS — G5603 Carpal tunnel syndrome, bilateral upper limbs: Secondary | ICD-10-CM

## 2023-02-02 DIAGNOSIS — K219 Gastro-esophageal reflux disease without esophagitis: Secondary | ICD-10-CM | POA: Diagnosis not present

## 2023-02-02 DIAGNOSIS — E611 Iron deficiency: Secondary | ICD-10-CM | POA: Diagnosis not present

## 2023-02-02 DIAGNOSIS — Z6841 Body Mass Index (BMI) 40.0 and over, adult: Secondary | ICD-10-CM | POA: Diagnosis not present

## 2023-02-02 DIAGNOSIS — Z981 Arthrodesis status: Secondary | ICD-10-CM

## 2023-02-02 DIAGNOSIS — R635 Abnormal weight gain: Secondary | ICD-10-CM | POA: Diagnosis not present

## 2023-02-02 DIAGNOSIS — E559 Vitamin D deficiency, unspecified: Secondary | ICD-10-CM | POA: Diagnosis not present

## 2023-02-02 MED ORDER — VITAMIN D (ERGOCALCIFEROL) 1.25 MG (50000 UNIT) PO CAPS
50000.0000 [IU] | ORAL_CAPSULE | ORAL | 0 refills | Status: DC
  Filled 2023-02-02: qty 12, 84d supply, fill #0

## 2023-02-03 ENCOUNTER — Encounter: Payer: Self-pay | Admitting: Sports Medicine

## 2023-02-03 ENCOUNTER — Telehealth: Payer: Self-pay

## 2023-02-03 ENCOUNTER — Ambulatory Visit (INDEPENDENT_AMBULATORY_CARE_PROVIDER_SITE_OTHER): Payer: Commercial Managed Care - HMO | Admitting: Sports Medicine

## 2023-02-03 ENCOUNTER — Other Ambulatory Visit (HOSPITAL_COMMUNITY): Payer: Self-pay

## 2023-02-03 DIAGNOSIS — M79645 Pain in left finger(s): Secondary | ICD-10-CM

## 2023-02-03 DIAGNOSIS — G5603 Carpal tunnel syndrome, bilateral upper limbs: Secondary | ICD-10-CM | POA: Diagnosis not present

## 2023-02-03 DIAGNOSIS — G8929 Other chronic pain: Secondary | ICD-10-CM

## 2023-02-03 DIAGNOSIS — M1811 Unilateral primary osteoarthritis of first carpometacarpal joint, right hand: Secondary | ICD-10-CM | POA: Diagnosis not present

## 2023-02-03 DIAGNOSIS — Z981 Arthrodesis status: Secondary | ICD-10-CM

## 2023-02-03 MED ORDER — DULOXETINE HCL 60 MG PO CPEP: 60.0000 mg | ORAL_CAPSULE | Freq: Every day | ORAL | 1 refills | Status: DC

## 2023-02-03 MED ORDER — PREGABALIN 75 MG PO CAPS: 75.0000 mg | ORAL_CAPSULE | Freq: Two times a day (BID) | ORAL | 0 refills | Status: DC

## 2023-02-03 NOTE — Progress Notes (Signed)
Kim Watson - 52 y.o. female MRN [id number]  Date of birth: 1971-07-22  Office Visit Note: Visit Date: 02/03/2023 PCP: Charyl Dancer, NP Referred by: Charyl Dancer, NP  Subjective: Chief Complaint  Patient presents with   Right Hand - Follow-up   Left Hand - Follow-up   HPI: Kim Watson is a pleasant 52 y.o. female who presents today for follow-up of bilateral carpal tunnel, CMC joint thumb pain.   Carpal tunnel syndrome -feels like this is most certainly improved.  She has been wearing the bracing at nighttime and swears by this that this significantly reduces her symptoms.  Has been able to sleep much more comfortably with ease.  She is following up for her EMG today which did show moderate bilateral median nerve entrapment at the wrist.  Tolerating the Lyrica dosing.  Right thumb CMC - right thumb is doing essentially 100% better.  Still has occasional stiffness, but her pain has resolved after her [facility] joint injection on 01/08/2023. She does note that the left thumb is slightly bothersome as well, although certainly not like the right thumb.  Pertinent ROS were reviewed with the patient and found to be negative unless otherwise specified above in HPI.   Assessment & Plan: Visit Diagnoses:  1. Carpal tunnel syndrome, bilateral   2. Osteoarthritis of carpometacarpal (CMC) joint of right thumb, unspecified osteoarthritis type   3. S/P cervical spinal fusion   4. Chronic pain of left thumb    Plan: Discussed and reviewed Hodaya's carpal tunnel syndrome thumbs and her EMG which do show moderate carpal tunnel syndrome.  She has gotten fairly good relief from night splinting as well as the increased dose of the Lyrica.  She will continue this, we did refill and I do want her to continue Lyrica 75 mg to be taken twice daily.  She may ice the wrist after using them to help with swelling control.  I do not think she is at the point where surgery is warranted, she is agreeable.  If  for some reason this starts to flareup, we could always consider an ultrasound-guided injection around the median nerve in the carpal tunnel, we will hold on this for now.  In terms of her [facility] joint and thumb pain, she had marked improvement from the right side, the left side does not bother her enough to warrant injection therapy.  May use topical medication such as Voltaren gel and ice.  She will follow-up with me as needed.  Follow-up: Return if symptoms worsen or fail to improve.   Meds & Orders:  Meds ordered this encounter  Medications   pregabalin (LYRICA) 75 MG capsule    Sig: Take 1 capsule (75 mg total) by mouth 2 (two) times daily.    Dispense:  120 capsule    Refill:  0   No orders of the defined types were placed in this encounter.    Procedures:  EMG study on 01/16/23:  Clinically she seems to have carpal tunnel syndrome mixed with CMC joint arthritis.  Complicating feature is the history of severe cervical stenosis with myelopathy.  Electrodiagnostic study performed today.  Central nervous system pathology such as myelopathy may not show up on this particular study.  The above electrodiagnostic study is ABNORMAL and reveals evidence of a moderate bilateral median nerve entrapment at the wrist (carpal tunnel syndrome) affecting sensory and motor components.       Clinical History: No specialty comments available.  She reports  that she has never smoked. She has never used smokeless tobacco.  Recent Labs    07/14/22 1345 12/12/22 1527  HGBA1C 5.8 5.5    Objective:    Physical Exam  Gen: Well-appearing, in no acute distress; non-toxic CV:  Well-perfused. Warm.  Resp: Breathing unlabored on room air; no wheezing. Psych: Fluid speech in conversation; appropriate affect; normal thought process Neuro: Sensation intact throughout. No gross coordination deficits.   Ortho Exam - Bilateral wrists: Improved Tinel's over the right carpal tunnel, negative over the left.  While  holding hands and Phalen's test, there is a mild reproduction in symptoms.  There is full range of motion in flexion extension of bilateral wrist.  - Bilateral thumbs: No bony TTP over either thumb today.  There is no laxity with RCL or UCL testing.  There is some mild CMC grind test on the left.  No left leg swelling or redness.  Imaging: No results found.  Past Medical/Family/Surgical/Social History: Medications & Allergies reviewed per EMR, new medications updated. 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   Primary hypertension 05/07/2022   Neck pain 05/07/2022   Acute left-sided low back pain without sciatica 02/24/2022   Bursitis of left shoulder 01/10/2022   Dry mouth 01/10/2022   History of bariatric surgery 01/10/2022   Elevated blood pressure reading 01/10/2022   Anxiety and depression 01/10/2022   Gastroesophageal reflux disease 01/10/2022   Chronic migraine without aura without status migrainosus, not intractable 08/31/2018   Prediabetes 07/13/2017   At risk for diabetes mellitus 07/02/2017   Insulin resistance 07/02/2017   Vitamin D deficiency 07/02/2017   Other hyperlipidemia 07/02/2017   Morbid obesity ([place]) 06/18/2017   Chronic pain of both shoulders 11/02/2014   Arthritis of both knees 10/04/2014   Past Medical History:  Diagnosis Date   Anxiety    Arthritis    Depression    Gallbladder problem    GERD (gastroesophageal reflux disease)    High blood pressure    Hypertension    Migraine    Obesity    Obstructive sleep apnea    2016 was cleared from having to use CPAP at night. Gastric bypass surgery 2015.   Vitamin D deficiency    Family History  Problem Relation Age of  Onset   Diabetes Maternal Grandmother    Heart disease Maternal Grandmother    Diabetes Other    Hyperlipidemia Other    Hypertension Other    BRCA 1/2 Neg Hx    Past Surgical History:  Procedure Laterality Date   ANTERIOR CERVICAL DECOMP/DISCECTOMY FUSION N/A 10/02/2022   Procedure: ANTERIOR CERVICAL DISCECTOMY AND FUSION CERVICAL FOUR TO SEVEN;  Surgeon: Melina Schools, MD;  Location: [place];  Service: Orthopedics;  Laterality: N/A;  4hrs 3 C-Bed   CHOLECYSTECTOMY     FOOT SURGERY Left 07/26/2022   5th metatarsal   SLEEVE GASTROPLASTY     TUBAL LIGATION     Social History   Occupational History   Occupation: Waitress  Tobacco Use   Smoking status: Never   Smokeless tobacco: Never  Vaping Use   Vaping Use: Never used  Substance and Sexual Activity   Alcohol use: Not Currently    Comment: socially   Drug  use: No   Sexual activity: Yes    Birth control/protection: Surgical

## 2023-02-03 NOTE — Telephone Encounter (Signed)
Patient notified that Rx sent to pharmacy.

## 2023-02-03 NOTE — Progress Notes (Signed)
Follow up after EMG States that they feel slightly better; the bracing at night helps  Left is worse than right

## 2023-02-03 NOTE — Telephone Encounter (Signed)
Patient wanted to get more refills on her Rx of Cymbalta and not having to send a request in every month.

## 2023-02-03 NOTE — Telephone Encounter (Signed)
Patient notified that her Rx was sent in.

## 2023-02-09 ENCOUNTER — Other Ambulatory Visit (HOSPITAL_COMMUNITY): Payer: Self-pay

## 2023-02-13 ENCOUNTER — Ambulatory Visit (INDEPENDENT_AMBULATORY_CARE_PROVIDER_SITE_OTHER): Payer: Commercial Managed Care - HMO | Admitting: Sports Medicine

## 2023-02-13 ENCOUNTER — Encounter: Payer: Self-pay | Admitting: Sports Medicine

## 2023-02-13 DIAGNOSIS — G8929 Other chronic pain: Secondary | ICD-10-CM

## 2023-02-13 DIAGNOSIS — M1811 Unilateral primary osteoarthritis of first carpometacarpal joint, right hand: Secondary | ICD-10-CM | POA: Diagnosis not present

## 2023-02-13 DIAGNOSIS — M79645 Pain in left finger(s): Secondary | ICD-10-CM | POA: Diagnosis not present

## 2023-02-13 DIAGNOSIS — G5603 Carpal tunnel syndrome, bilateral upper limbs: Secondary | ICD-10-CM

## 2023-02-13 MED ORDER — METHYLPREDNISOLONE 4 MG PO TBPK: ORAL_TABLET | ORAL | 0 refills | Status: DC

## 2023-02-13 NOTE — Progress Notes (Signed)
Kim Watson - 52 y.o. female MRN [id number]  Date of birth: 06-04-1971  Office Visit Note: Visit Date: 02/13/2023 PCP: Charyl Dancer, NP Referred by: Charyl Dancer, NP  Subjective: Chief Complaint  Patient presents with   Right Hand - Pain    Hands hurt, last night was the only night that they didn't hurt, but they are today.   Left Hand - Pain   HPI: Kim Watson is a pleasant 52 y.o. female who presents today for bilateral hand pain with known carpal tunnel.   Bilateral hand pain - indicative of an exacerbation of her carpal tunnel.  At her last visit just 2 weeks ago she had significant improvement of her pain, but with weather changes she has noticed some swelling in the hands and this has exacerbated her carpal tunnel syndrome.  She is having pain that this is interfering with sleeping and her overall quality of life.  She continues on twice daily Lyrica dosing.  And her cock-up wrist braces bilaterally.  Right thumb CMC - right thumb is doing much better since her injection on 01/08/2023.  Moving much more freely without grinding.  Her left thumb bothers her occasionally as well, although not to the point where she would need an injection.  Pertinent ROS were reviewed with the patient and found to be negative unless otherwise specified above in HPI.   Assessment & Plan: Visit Diagnoses:  1. Carpal tunnel syndrome, bilateral   2. Osteoarthritis of carpometacarpal (CMC) joint of right thumb, unspecified osteoarthritis type   3. Chronic pain of left thumb    Plan: Discussed with Kim Watson that unfortunately she has had an exacerbation of her chronic bilateral carpal tunnel.  Unsure the exact cause, but may have been weather related as she did have some increased swelling which likely compressed her median nerve.  Discussed all treatment options, given the bilateral symptoms and with some of her thumb pain, did proceed with methylprednisolone taper x 6 days.  She will ice  overlying the wrist to help slow down the pain and swelling.  She will continue her Lyrica 75 mg twice daily.  I would like to see what sort of relief this gives her over the next 2 weeks.  If for some reason this does not settle down her pain, we discussed next step would be consideration of an ultrasound-guided median nerve injection.  She is not interested in carpal tunnel release surgery at this time.  She will follow-up with me as needed.  Follow-up: Return if symptoms worsen or fail to improve.   Meds & Orders:  Meds ordered this encounter  Medications   methylPREDNISolone (MEDROL DOSEPAK) 4 MG TBPK tablet    Sig: Take per packet instructions. Taper dosing.    Dispense:  1 each    Refill:  0   No orders of the defined types were placed in this encounter.    Procedures: No procedures performed     EMG study on 01/16/23:  Clinically she seems to have carpal tunnel syndrome mixed with CMC joint arthritis.  Complicating feature is the history of severe cervical stenosis with myelopathy.  Electrodiagnostic study performed today.  Central nervous system pathology such as myelopathy may not show up on this particular study.  The above electrodiagnostic study is ABNORMAL and reveals evidence of a moderate bilateral median nerve entrapment at the wrist (carpal tunnel syndrome) affecting sensory and motor components.     Clinical History: No specialty comments available.  She reports that she has never smoked. She has never used smokeless tobacco.  Recent Labs    07/14/22 1345 12/12/22 1527  HGBA1C 5.8 5.5    Objective:    Physical Exam  Gen: Well-appearing, in no acute distress; non-toxic CV:  Well-perfused. Warm.  Resp: Breathing unlabored on room air; no wheezing. Psych: Fluid speech in conversation; appropriate affect; normal thought process Neuro: Sensation intact throughout. No gross coordination deficits.   Ortho Exam - Bilateral wrists: Equivocal Tinel's over the carpal  tunnel.  There is reproduction of pain with positive Phalen's and reverse Phalen's test.  There is some mild generalized swelling throughout all 5 digits bilaterally.  There is full range of motion of the wrist in flexion and extension.  There is mild pallor of the bilateral fingers although cap refill is less than 2 seconds bilaterally.  -  Bilateral thumbs: No bony TTP over either thumb today.  There is no laxity with RCL or UCL testing.  There is some mild CMC grind test on the left.  No left leg swelling or redness.   Imaging: No results found.  Past Medical/Family/Surgical/Social History: Medications & Allergies reviewed per EMR, new medications updated. Patient Active Problem List   Diagnosis Date Noted   Bilateral carpal tunnel syndrome 12/12/2022   Tendonitis 12/12/2022   Cervical myelopathy ([place]) 10/02/2022   Preop examination 09/16/2022   COVID-19 08/20/2022   Acute nonintractable headache 08/20/2022   Pain in right shoulder 06/27/2022   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/07/2022   Primary hypertension 05/07/2022   Neck pain 05/07/2022   Acute left-sided low back pain without sciatica 02/24/2022   Bursitis of left shoulder 01/10/2022   Dry mouth 01/10/2022   History of bariatric surgery 01/10/2022   Elevated blood pressure reading 01/10/2022   Anxiety and depression 01/10/2022   Gastroesophageal reflux disease 01/10/2022   Chronic migraine without aura without status migrainosus, not intractable 08/31/2018   Prediabetes 07/13/2017   At risk for diabetes mellitus 07/02/2017   Insulin resistance 07/02/2017   Vitamin D deficiency 07/02/2017   Other hyperlipidemia 07/02/2017   Morbid obesity ([place]) 06/18/2017   Chronic pain of both shoulders 11/02/2014   Arthritis of both knees 10/04/2014   Past Medical History:  Diagnosis Date   Anxiety    Arthritis    Depression    Gallbladder problem    GERD (gastroesophageal reflux disease)    High blood pressure    Hypertension    Migraine     Obesity    Obstructive sleep apnea    2016 was cleared from having to use CPAP at night. Gastric bypass surgery 2015.   Vitamin D deficiency    Family History  Problem Relation Age of Onset   Diabetes Maternal Grandmother    Heart disease Maternal Grandmother    Diabetes Other    Hyperlipidemia Other    Hypertension Other    BRCA 1/2 Neg Hx    Past Surgical History:  Procedure Laterality Date   ANTERIOR CERVICAL DECOMP/DISCECTOMY FUSION N/A 10/02/2022   Procedure: ANTERIOR CERVICAL DISCECTOMY AND FUSION CERVICAL FOUR TO SEVEN;  Surgeon: Melina Schools, MD;  Location: [facility];  Service: Orthopedics;  Laterality: N/A;  4hrs 3 C-Bed   CHOLECYSTECTOMY     FOOT SURGERY Left 07/26/2022   5th metatarsal   SLEEVE GASTROPLASTY     TUBAL LIGATION     Social History   Occupational History   Occupation: Waitress  Tobacco Use   Smoking status: Never   Smokeless tobacco:  Never  Vaping Use   Vaping Use: Never used  Substance and Sexual Activity   Alcohol use: Not Currently    Comment: socially   Drug use: No   Sexual activity: Yes    Birth control/protection: Surgical

## 2023-03-10 ENCOUNTER — Other Ambulatory Visit: Payer: Self-pay

## 2023-03-10 ENCOUNTER — Encounter: Payer: Self-pay | Admitting: Sports Medicine

## 2023-03-10 ENCOUNTER — Ambulatory Visit (INDEPENDENT_AMBULATORY_CARE_PROVIDER_SITE_OTHER): Payer: Commercial Managed Care - HMO | Admitting: Sports Medicine

## 2023-03-10 DIAGNOSIS — M79641 Pain in right hand: Secondary | ICD-10-CM

## 2023-03-10 DIAGNOSIS — G5603 Carpal tunnel syndrome, bilateral upper limbs: Secondary | ICD-10-CM | POA: Diagnosis not present

## 2023-03-10 DIAGNOSIS — M79642 Pain in left hand: Secondary | ICD-10-CM | POA: Diagnosis not present

## 2023-03-10 MED ORDER — LIDOCAINE HCL 1 % IJ SOLN
3.0000 mL | INTRAMUSCULAR | Status: AC | PRN
Start: 2023-03-10 — End: 2023-03-10
  Administered 2023-03-10: 3 mL

## 2023-03-10 MED ORDER — BETAMETHASONE SOD PHOS & ACET 6 (3-3) MG/ML IJ SUSP
6.0000 mg | INTRAMUSCULAR | Status: AC | PRN
Start: 2023-03-10 — End: 2023-03-10
  Administered 2023-03-10: 6 mg via INTRA_ARTICULAR

## 2023-03-10 NOTE — Progress Notes (Signed)
Kim Watson - 52 y.o. female MRN [id number]  Date of birth: [birth year]  Office Visit Note: Visit Date: 03/10/2023 PCP: Gerre Scull, NP Referred by: Gerre Scull, NP  Subjective: Chief Complaint  Patient presents with   Left Wrist - Pain   Right Wrist - Pain   HPI: Kim Watson is a pleasant 52 y.o. female who presents today for bilateral carpal tunnel syndrome.  She has known carpal tunnel syndrome with nerve conduction studies that have at least moderate bilateral median nerve entrapment.  She has been doing well with conservative treatment with cock-up wrist braces and her twice daily Lyrica.  However over the past few weeks her pain is really been exacerbated with burning sensation in the fingers.  She is having difficulty carrying her grandson.   Pertinent ROS were reviewed with the patient and found to be negative unless otherwise specified above in HPI.   Assessment & Plan: Visit Diagnoses:  1. Carpal tunnel syndrome, bilateral   2. Bilateral hand pain    Plan: Discussed with Cashlyn she has had an exacerbation of her chronic bilateral carpal tunnel syndrome.  She is no longer getting good relief from conservative treatment.  Through shared decision-making, we will proceed with bilateral carpal tunnel injection and hydrodissection, she tolerated this well.  She will continue in her cock-up wrist braces bilaterally to be worn at night.  I would like her to take over-the-counter anti-inflammatories for the next 3-6 days.  She will continue Lyrica 75 mg twice daily.  She will follow-up with me as needed.  Hopefully this will help calm down her symptoms, we did discuss if she does not get significant relief for a few months, next steps could be consideration of carpal tunnel release surgery.  Hopefully we do not get to this point.  Follow-up: Return if symptoms worsen or fail to improve.   Meds & Orders:  - Continue Lyrica 75mg  BID   Orders Placed This Encounter   Procedures   Hand/UE Inj   US Guided Needle Placement - No Linked Charges     Procedures: Hand/UE Inj: bilateral carpal tunnel for carpal tunnel syndrome on 03/10/2023 5:02 PM Indications: pain and therapeutic Details: 25 G needle, ultrasound-guided volar approach Medications (Right): 3 mL lidocaine 1 %; 6 mg betamethasone acetate-betamethasone sodium phosphate 6 (3-3) MG/ML (D5W: 3 mL) Medications (Left): 3 mL lidocaine 1 %; 6 mg betamethasone acetate-betamethasone sodium phosphate 6 (3-3) MG/ML (D5W: 3 mL) Outcome: tolerated well, no immediate complications  Procedure: US-guided Carpal Tunnel Injection, Right Wrist After informed verbal consent and discussion on R/B/I, a timeout was performed, patient was seated on exam table with the affected arm placed in supine position on table. The area overlying the patient's carpal tunnel was prepped with Betadine and alcohol swabs then utilizing ultrasound guidance via an in-plane approach, the patient's carpal tunnel was injected with a mixture of 3:3:1 lidocaine:D5W:betamethasone with hydrodissection of the median nerve from the flexor retinaculum in 360 degree fashion. Visualization of the needle was achieved with a transverse, in-plane approach. Patient tolerated procedure well without immediate complications.  Procedure: US-guided Carpal Tunnel Injection, Left Wrist After informed verbal consent and discussion on R/B/I, a timeout was performed, patient was seated on exam table with the affected arm placed in supine position on table. The area overlying the patient's carpal tunnel was prepped with Betadine and alcohol swabs then utilizing ultrasound guidance via an in-plane approach, the patient's carpal tunnel was injected with a mixture of  3:3:1 lidocaine:D5W:betamethasone with hydrodissection of the median nerve from the flexor retinaculum in 360 degree fashion. Visualization of the needle was achieved with a transverse, in-plane approach. Patient  tolerated procedure well without immediate complications.  Procedure, treatment alternatives, risks and benefits explained, specific risks discussed. Consent was given by the patient. Immediately prior to procedure a time out was called to verify the correct patient, procedure, equipment, support staff and site/side marked as required. Patient was prepped and draped in the usual sterile fashion.          Clinical History: No specialty comments available.  She reports that she has never smoked. She has never used smokeless tobacco.  Recent Labs    07/14/22 1345 12/12/22 1527  HGBA1C 5.8 5.5   Objective:   Vital Signs: There were no vitals taken for this visit.  Physical Exam  Gen: Well-appearing, in no acute distress; non-toxic CV: Well-perfused. Warm.  Resp: Breathing unlabored on room air; no wheezing. Psych: Fluid speech in conversation; appropriate affect; normal thought process Neuro: Sensation intact throughout. No gross coordination deficits.   Ortho Exam - Bilateral wrists: No swelling of the hands or digits.  Positive Tinel's bilaterally over the carpal tunnel.  With Phalen's testing medially, some mild discoloration of the index and middle finger.  Cap refill less than 2 seconds.  Imaging: No results found.  EMG & NCV Findings: Evaluation of the left median motor and the right median motor nerves showed prolonged distal onset latency (L5.0, R5.3 ms) and decreased conduction velocity (Elbow-Wrist, L49, R46 m/s).  The left median (across palm) sensory nerve showed no response (Palm) and prolonged distal peak latency (5.4 ms).  The right median (across palm) sensory nerve showed prolonged distal peak latency (Wrist, 5.0 ms) and prolonged distal peak latency (Palm, 5.6 ms).  All remaining nerves (as indicated in the following tables) were within normal limits.  All left vs. right side differences were within normal limits.     All examined muscles (as indicated in the  following table) showed no evidence of electrical instability.     Impression: The above electrodiagnostic study is ABNORMAL and reveals evidence of a moderate bilateral median nerve entrapment at the wrist (carpal tunnel syndrome) affecting sensory and motor components.    There is no significant electrodiagnostic evidence of any other focal nerve entrapment, brachial plexopathy or cervical radiculopathy.    Recommendations: 1.  Follow-up with referring physician. 2.  Continue current management of symptoms. 3.  Continue use of resting splint at night-time and as needed during the day. 4.  Suggest surgical evaluation.   ___________________________ Elease HashimotoFred Newton FAAPMR Board Certified, American Board of Physical Medicine and Rehabilitation  Past Medical/Family/Surgical/Social History: Medications & Allergies reviewed per EMR, new medications updated. Patient Active Problem List   Diagnosis Date Noted   Bilateral carpal tunnel syndrome 12/12/2022   Tendonitis 12/12/2022   Cervical myelopathy 10/02/2022   Preop examination 09/16/2022   COVID-19 08/20/2022   Acute nonintractable headache 08/20/2022   Pain in right shoulder 06/27/2022   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/07/2022   Primary hypertension 05/07/2022   Neck pain 05/07/2022   Acute left-sided low back pain without sciatica 02/24/2022   Bursitis of left shoulder 01/10/2022   Dry mouth 01/10/2022   History of bariatric surgery 01/10/2022   Elevated blood pressure reading 01/10/2022   Anxiety and depression 01/10/2022   Gastroesophageal reflux disease 01/10/2022   Chronic migraine without aura without status migrainosus, not intractable 08/31/2018   Prediabetes 07/13/2017   At risk  for diabetes mellitus 07/02/2017   Insulin resistance 07/02/2017   Vitamin D deficiency 07/02/2017   Other hyperlipidemia 07/02/2017   Morbid obesity 06/18/2017   Chronic pain of both shoulders 11/02/2014   Arthritis of both knees 10/04/2014   Past  Medical History:  Diagnosis Date   Anxiety    Arthritis    Depression    Gallbladder problem    GERD (gastroesophageal reflux disease)    High blood pressure    Hypertension    Migraine    Obesity    Obstructive sleep apnea    2016 was cleared from having to use CPAP at night. Gastric bypass surgery 2015.   Vitamin D deficiency    Family History  Problem Relation Age of Onset   Diabetes Maternal Grandmother    Heart disease Maternal Grandmother    Diabetes Other    Hyperlipidemia Other    Hypertension Other    BRCA 1/2 Neg Hx    Past Surgical History:  Procedure Laterality Date   ANTERIOR CERVICAL DECOMP/DISCECTOMY FUSION N/A 10/02/2022   Procedure: ANTERIOR CERVICAL DISCECTOMY AND FUSION CERVICAL FOUR TO SEVEN;  Surgeon: Venita Lick, MD;  Location: [facility];  Service: Orthopedics;  Laterality: N/A;  4hrs 3 C-Bed   CHOLECYSTECTOMY     FOOT SURGERY Left 07/26/2022   5th metatarsal   SLEEVE GASTROPLASTY     TUBAL LIGATION     Social History   Occupational History   Occupation: Waitress  Tobacco Use   Smoking status: Never   Smokeless tobacco: Never  Vaping Use   Vaping Use: Never used  Substance and Sexual Activity   Alcohol use: Not Currently    Comment: socially   Drug use: No   Sexual activity: Yes    Birth control/protection: Surgical

## 2023-03-12 ENCOUNTER — Encounter: Payer: Self-pay | Admitting: Nurse Practitioner

## 2023-03-12 MED ORDER — PANTOPRAZOLE SODIUM 40 MG PO TBEC: 40.0000 mg | DELAYED_RELEASE_TABLET | Freq: Every day | ORAL | 1 refills | Status: DC

## 2023-03-16 DIAGNOSIS — Z9884 Bariatric surgery status: Secondary | ICD-10-CM | POA: Diagnosis not present

## 2023-03-16 DIAGNOSIS — F3342 Major depressive disorder, recurrent, in full remission: Secondary | ICD-10-CM | POA: Diagnosis not present

## 2023-03-24 ENCOUNTER — Other Ambulatory Visit: Payer: Self-pay | Admitting: Nurse Practitioner

## 2023-03-25 NOTE — Telephone Encounter (Signed)
Refill request for  Clonazepam ODT 0.5 mg LR  09/15/22, #30, 2 rf LOV 12/12/22 FOV  none scheduled.  Please review and advise.  Thanks. Dm/cma

## 2023-04-12 IMAGING — DX DG LUMBAR SPINE COMPLETE 4+V
5 series · 5 of 5 positions shown · non-contrast
Comparison: X-ray lumbar spine 01/23/2020

CLINICAL DATA: acute back pain.  Cough and felt instant pain

EXAM:
LUMBAR SPINE - COMPLETE 4+ VIEW

[l-spine ap]
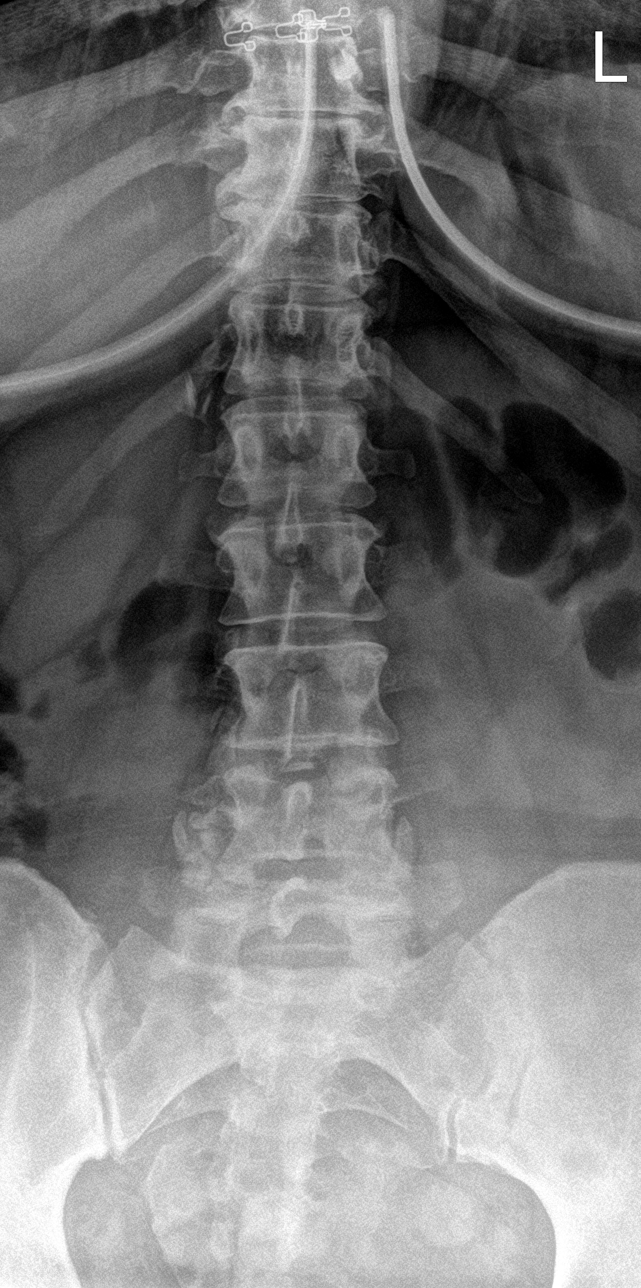

[l-spine obl (1 of 2)]
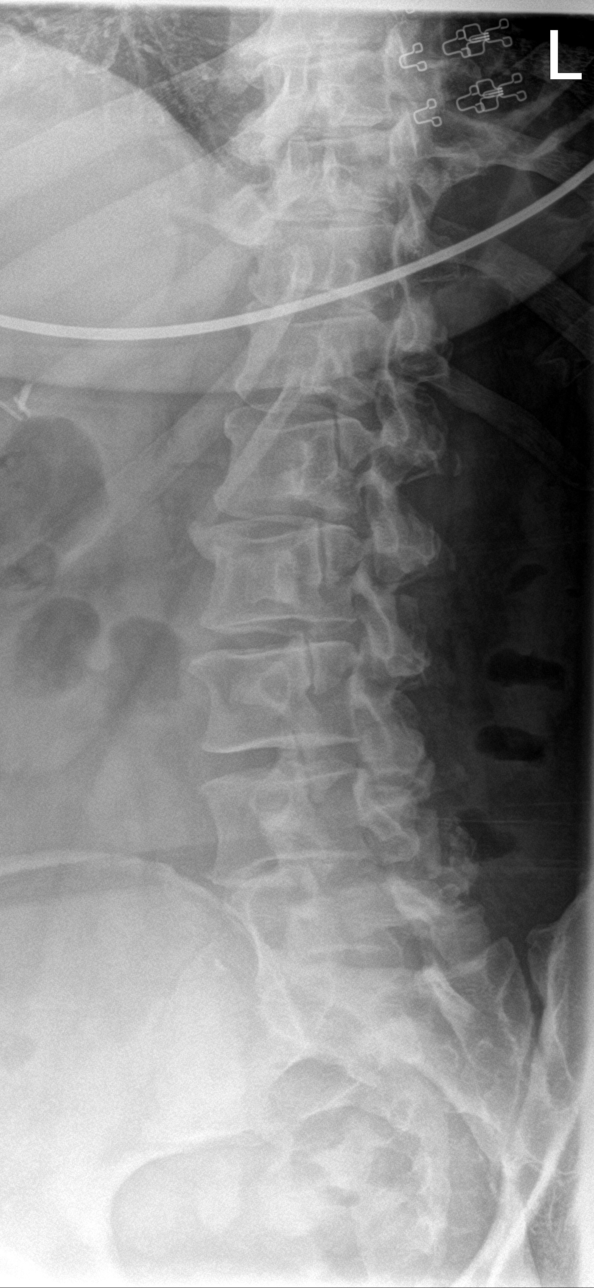

[l-spine obl (2 of 2)]
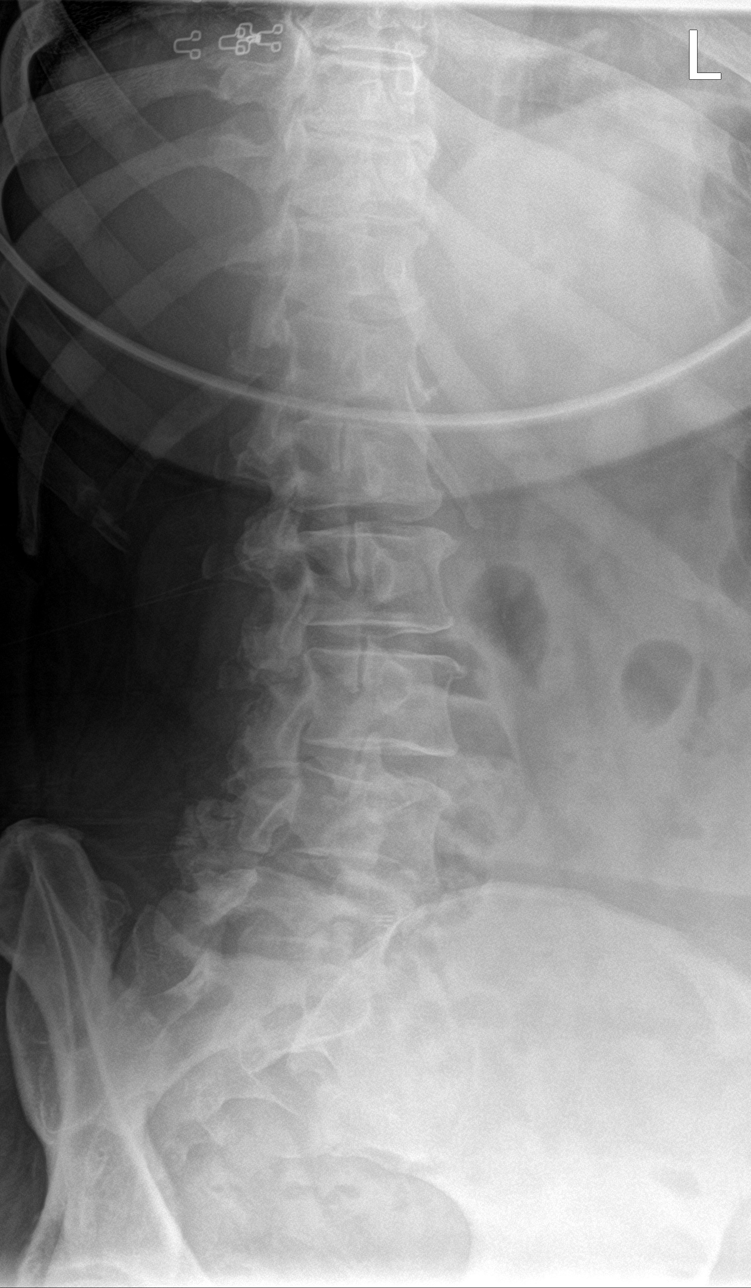

[l-spine lat (1 of 2)]
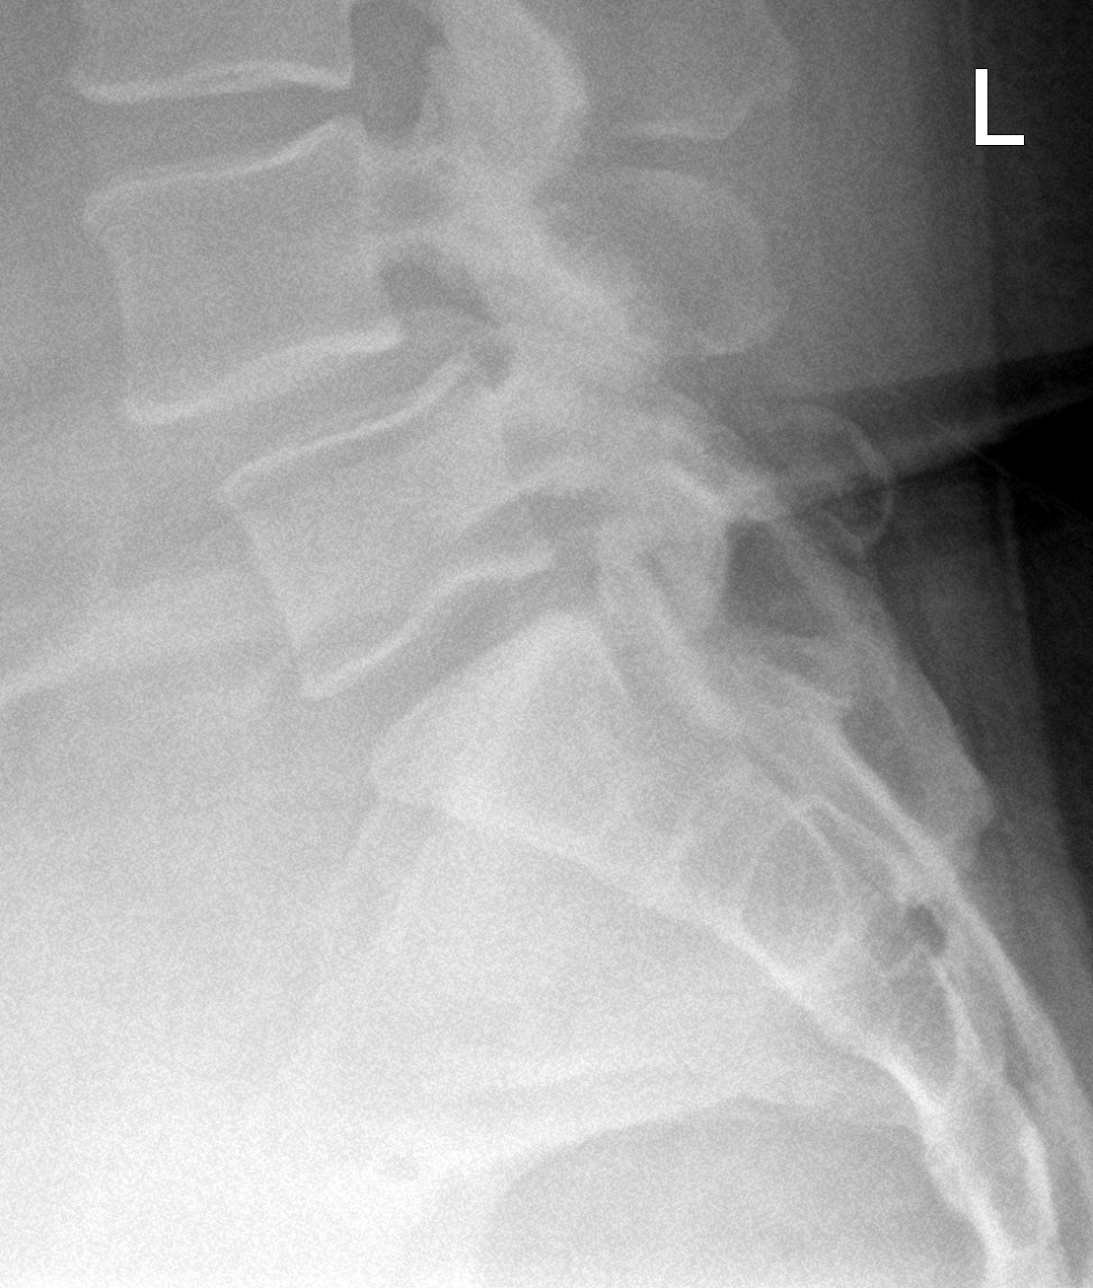

[l-spine lat (2 of 2)]
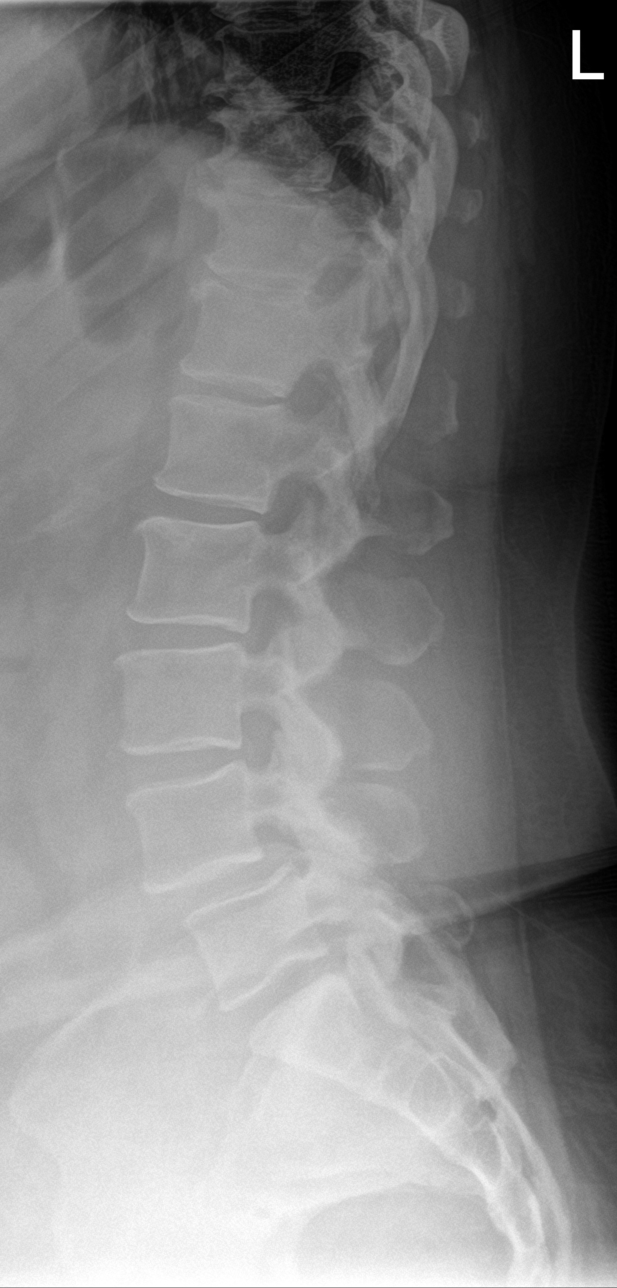

[5 of 5 positions shown; findings below may reference images not displayed]

FINDINGS: Five non-rib-bearing lumbar vertebral bodies.

Mild osteophyte formation and at least moderate facet arthropathy of
the lumbar spine.

Moderate degenerative changes of the visualized thoracic spine.
There is no evidence of lumbar spine fracture. Stable grade 1
anterolisthesis of L4 on L5. Intervertebral lumbar disc spaces are
maintained.
IMPRESSION: Negative for acute traumatic injury.

## 2023-04-14 ENCOUNTER — Ambulatory Visit (INDEPENDENT_AMBULATORY_CARE_PROVIDER_SITE_OTHER): Payer: Commercial Managed Care - HMO | Admitting: Sports Medicine

## 2023-04-14 ENCOUNTER — Ambulatory Visit (INDEPENDENT_AMBULATORY_CARE_PROVIDER_SITE_OTHER): Payer: Commercial Managed Care - HMO | Admitting: Plastic Surgery

## 2023-04-14 ENCOUNTER — Encounter: Payer: Self-pay | Admitting: Plastic Surgery

## 2023-04-14 ENCOUNTER — Encounter: Payer: Self-pay | Admitting: Sports Medicine

## 2023-04-14 VITALS — BP 149/88 | HR 78 | Ht 66.0 in | Wt 281.6 lb

## 2023-04-14 DIAGNOSIS — M7552 Bursitis of left shoulder: Secondary | ICD-10-CM

## 2023-04-14 DIAGNOSIS — G5603 Carpal tunnel syndrome, bilateral upper limbs: Secondary | ICD-10-CM | POA: Diagnosis not present

## 2023-04-14 DIAGNOSIS — M549 Dorsalgia, unspecified: Secondary | ICD-10-CM | POA: Insufficient documentation

## 2023-04-14 DIAGNOSIS — G8929 Other chronic pain: Secondary | ICD-10-CM | POA: Diagnosis not present

## 2023-04-14 DIAGNOSIS — Z6841 Body Mass Index (BMI) 40.0 and over, adult: Secondary | ICD-10-CM

## 2023-04-14 DIAGNOSIS — M542 Cervicalgia: Secondary | ICD-10-CM | POA: Diagnosis not present

## 2023-04-14 DIAGNOSIS — R7303 Prediabetes: Secondary | ICD-10-CM

## 2023-04-14 DIAGNOSIS — N62 Hypertrophy of breast: Secondary | ICD-10-CM | POA: Diagnosis not present

## 2023-04-14 DIAGNOSIS — M1811 Unilateral primary osteoarthritis of first carpometacarpal joint, right hand: Secondary | ICD-10-CM | POA: Diagnosis not present

## 2023-04-14 DIAGNOSIS — M25512 Pain in left shoulder: Secondary | ICD-10-CM

## 2023-04-14 DIAGNOSIS — M545 Low back pain, unspecified: Secondary | ICD-10-CM

## 2023-04-14 DIAGNOSIS — M546 Pain in thoracic spine: Secondary | ICD-10-CM

## 2023-04-14 DIAGNOSIS — G959 Disease of spinal cord, unspecified: Secondary | ICD-10-CM

## 2023-04-14 DIAGNOSIS — G43709 Chronic migraine without aura, not intractable, without status migrainosus: Secondary | ICD-10-CM

## 2023-04-14 DIAGNOSIS — M79644 Pain in right finger(s): Secondary | ICD-10-CM | POA: Diagnosis not present

## 2023-04-14 DIAGNOSIS — M25511 Pain in right shoulder: Secondary | ICD-10-CM

## 2023-04-14 DIAGNOSIS — Z9884 Bariatric surgery status: Secondary | ICD-10-CM

## 2023-04-14 NOTE — Progress Notes (Signed)
Hands are doing better from the injection Only concern she has is pain in her right hand/thumb Painful at work when grasping cups, plates,etc

## 2023-04-14 NOTE — Progress Notes (Signed)
Patient ID: Kim Watson, female    DOB: 04/01/1971, 52 y.o.   MRN: [id number]   Chief Complaint  Patient presents with   Consult        Breast Problem    Mammary Hyperplasia: The patient is a 52 y.o. female with a history of mammary hyperplasia for several years.  She has extremely large breasts causing symptoms that include the following: Back pain in the upper and lower back, including neck pain. She pulls or pins her bra straps to provide better lift and relief of the pressure and pain. She notices relief by holding her breast up manually.  Her shoulder straps cause grooves and pain and pressure that requires padding for relief. Pain medication is sometimes required with motrin and tylenol.  Activities that are hindered by enlarged breasts include: exercise and running.  She has tried supportive clothing as well as fitted bras without improvement.  Her breasts are extremely large and fairly symmetric.  She has hyperpigmentation of the inframammary area on both sides.  The sternal to nipple distance on the right is 41 cm and the left is 42 cm.  The IMF distance is 20 cm.  She is 5 feet 6 inches tall and weighs 281 pounds.  The BMI = 45.4 kg/m.  Preoperative bra size = F cup.  She would like to be a C or a D cup the estimated excess breast tissue to be removed at the time of surgery = 1200 grams on the left and 1200 grams on the right.  Mammogram history: February 2024 and was negative.  Family history of breast cancer: None.  Tobacco use: None.   The patient expresses the desire to pursue surgical intervention.  The patient has been to physical therapy and chiropractics without improvement in the neck and back area.  She has had spine surgery by Dr. Shon Baton in the past.  It is felt by him and the patient that a breast reduction would improve her symptoms of neck and back pain.  She is also planning on undergoing a revision to her gastric bypass.  This would be in August.  She is hoping that  she will lose around 100 pounds with a revision.     Review of Systems  Past Medical History:  Diagnosis Date   Anxiety    Arthritis    Depression    Gallbladder problem    GERD (gastroesophageal reflux disease)    High blood pressure    Hypertension    Migraine    Obesity    Obstructive sleep apnea    2016 was cleared from having to use CPAP at night. Gastric bypass surgery 2015.   Vitamin D deficiency     Past Surgical History:  Procedure Laterality Date   ANTERIOR CERVICAL DECOMP/DISCECTOMY FUSION N/A 10/02/2022   Procedure: ANTERIOR CERVICAL DISCECTOMY AND FUSION CERVICAL FOUR TO SEVEN;  Surgeon: Venita Lick, MD;  Location: [facility];  Service: Orthopedics;  Laterality: N/A;  4hrs 3 C-Bed   CHOLECYSTECTOMY     FOOT SURGERY Left 07/26/2022   5th metatarsal   SLEEVE GASTROPLASTY     TUBAL LIGATION        Current Outpatient Medications:    clonazePAM (KLONOPIN) 0.5 MG disintegrating tablet, DISSOLVE 1 TABLET(0.5 MG) ON THE TONGUE TWICE DAILY, Disp: 30 tablet, Rfl: 0   DULoxetine (CYMBALTA) 60 MG capsule, Take 1 capsule (60 mg total) by mouth daily., Disp: 90 capsule, Rfl: 1   losartan-hydrochlorothiazide (HYZAAR) 50-12.5  MG tablet, TAKE 1 TABLET BY MOUTH DAILY, Disp: 90 tablet, Rfl: 1   nystatin (MYCOSTATIN/NYSTOP) powder, Apply 1 Application topically 3 (three) times daily., Disp: 60 g, Rfl: 3   pantoprazole (PROTONIX) 40 MG tablet, Take 1 tablet (40 mg total) by mouth daily., Disp: 90 tablet, Rfl: 1   Vitamin D, Ergocalciferol, (DRISDOL) 1.25 MG (50000 UNIT) CAPS capsule, Take 1 capsule (50,000 Units total) by mouth once a week., Disp: 12 capsule, Rfl: 0   methocarbamol (ROBAXIN) 500 MG tablet, TAKE 1 TABLET BY MOUTH EVERY 8 HOURS AS NEEDED FOR UP TO 5 DAYS FOR MUSCLE SPASMS, Disp: , Rfl:    methylPREDNISolone (MEDROL DOSEPAK) 4 MG TBPK tablet, Take per packet instructions. Taper dosing., Disp: 1 each, Rfl: 0   ondansetron (ZOFRAN) 4 MG tablet, Take 1 tablet (4 mg total)  by mouth every 8 (eight) hours as needed for nausea or vomiting., Disp: 20 tablet, Rfl: 0   pregabalin (LYRICA) 75 MG capsule, Take 1 capsule (75 mg total) by mouth 2 (two) times daily., Disp: 120 capsule, Rfl: 0   Objective:   Vitals:   04/14/23 0943  BP: (!) 149/88  Pulse: 78  SpO2: 98%    Physical Exam Vitals and nursing note reviewed.  Constitutional:      Appearance: Normal appearance.  HENT:     Head: Normocephalic and atraumatic.   Cardiovascular:     Rate and Rhythm: Normal rate.     Pulses: Normal pulses.  Pulmonary:     Effort: No respiratory distress.     Breath sounds: No wheezing.  Abdominal:     General: There is no distension.     Palpations: Abdomen is soft.  Musculoskeletal:        General: Tenderness present. No swelling or signs of injury.  Skin:    General: Skin is warm.     Capillary Refill: Capillary refill takes less than 2 seconds.     Coloration: Skin is not jaundiced.     Findings: No bruising.  Neurological:     Mental Status: She is alert and oriented to person, place, and time.  Psychiatric:        Mood and Affect: Mood normal.        Behavior: Behavior normal.        Thought Content: Thought content normal.        Judgment: Judgment normal.     Assessment & Plan:  Chronic pain of both shoulders  Neck pain  History of bariatric surgery  Prediabetes  Morbid obesity ([facility])  Bursitis of left shoulder  Cervical myelopathy ([facility])  Chronic migraine without aura without status migrainosus, not intractable  Chronic bilateral thoracic back pain  Symptomatic mammary hypertrophy  Recommend the patient come and see [place] in January.  That way she will of had her bypass revision and we will have an idea of her trajectory for weight loss.  I think she is a good candidate for bilateral breast reduction with possible lateral liposuction.  She knows that an amputation technique is a real possibility.  Alena Bills Mortimer Bair, DO

## 2023-04-14 NOTE — Progress Notes (Signed)
Kim Watson - 52 y.o. female MRN [id number]  Date of birth: 12/05/1970  Office Visit Note: Visit Date: 04/14/2023 PCP: Gerre Scull, NP Referred by: Gerre Scull, NP  Subjective: Chief Complaint  Patient presents with   Right Hand - Pain   HPI: Kim Watson is a pleasant 52 y.o. female who presents today for right hand/thumb pain, follow-up of bilateral carpal tunnel.  Carpal tunnel - doing great status post ultrasound-guided injection bilaterally 08/10/2023.  Remains in a cock-up wrist braces at nighttime.  Continues on Lyrica 75 mg twice daily  CMC right thumb -this is started to bother her again recently, has bothered her chronically although has been impairing therapy here over the last short while she is having pain right at the [facility] joint as well as difficulty gripping and grasping items.  We did perform CMC joint injection back in February with good relief initially.  Is using topical Voltaren gel.  Pertinent ROS were reviewed with the patient and found to be negative unless otherwise specified above in HPI.   Assessment & Plan: Visit Diagnoses:  1. Osteoarthritis of carpometacarpal (CMC) joint of right thumb, unspecified osteoarthritis type   2. Chronic pain of right thumb   3. Carpal tunnel syndrome, bilateral    Plan: Discussed with Bernita that she and I both are pleased with the improvement in her bilateral carpal tunnel sydrome status post ultrasound-guided injection.  She continues in her cock-up wrist braces at nighttime.  She will continue her Lyrica 75 mg twice daily.  Unfortunately she has had an exacerbation of her chronic underlying CMC joint arthritis which is rather advanced.  This is interfering with her daily activities.  We did fit her for a CMC cool comfort brace that she will wear during the day.  I would like her to ice this for 15-20 minutes twice daily.  She will continue her topical Voltaren gel multiple times a day as well.  I would like to see  how she responds to this over the coming 2 weeks, if this does not settle down we may consider corticosteroid injection into the [facility] joint as she has found benefit from this in the past.  Cannot provide NSAIDs given her history of gastric sleeve; trying to limit her oral prednisone burden.  Follow-up: Return if symptoms worsen or fail to improve, for If not improved over next 1.5 - 2 weeks.   Meds & Orders: No orders of the defined types were placed in this encounter.  No orders of the defined types were placed in this encounter.    Procedures: No procedures performed      Clinical History: No specialty comments available.  She reports that she has never smoked. She has never used smokeless tobacco.  Recent Labs    07/14/22 1345 12/12/22 1527  HGBA1C 5.8 5.5    Objective:   Vital Signs: There were no vitals taken for this visit.  Physical Exam  Gen: Well-appearing, in no acute distress; non-toxic CV: Well-perfused. Warm.  Resp: Breathing unlabored on room air; no wheezing. Psych: Fluid speech in conversation; appropriate affect; normal thought process Neuro: Sensation intact throughout. No gross coordination deficits.   Ortho Exam - BL Wrists: Good movement with flexion and extension.  Negative Tinel's test today.  - Right hand/thumb: Positive TTP and mild swelling over the [facility] joint of the right thumb.  Equivocal CMC grind test.  There is limited flexion extension in this location compared to the contralateral thumb.  No UCL or RCL instability.  Imaging:  - Right hand xray 12/23/22: 3 views of the right hand including AP, oblique and lateral femoral  ordered and reviewed by myself.  X-rays demonstrate moderate to severe  osteoarthritic change with joint space narrowing at the [facility] joint of the  thumb, there is some sclerosis noted on the ulnar side of the joint.  Some  mild arthritic change throughout the carpal row, although no significant  bony abnormality or fracture.    Past Medical/Family/Surgical/Social History: Medications & Allergies reviewed per EMR, new medications updated. Patient Active Problem List   Diagnosis Date Noted   Back pain 04/14/2023   Symptomatic mammary hypertrophy 04/14/2023   Bilateral carpal tunnel syndrome 12/12/2022   Tendonitis 12/12/2022   Cervical myelopathy (HCC) 10/02/2022   Preop examination 09/16/2022   COVID-19 08/20/2022   Acute nonintractable headache 08/20/2022   Pain in right shoulder 06/27/2022   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/07/2022   Primary hypertension 05/07/2022   Neck pain 05/07/2022   Acute left-sided low back pain without sciatica 02/24/2022   Bursitis of left shoulder 01/10/2022   Dry mouth 01/10/2022   History of bariatric surgery 01/10/2022   Elevated blood pressure reading 01/10/2022   Anxiety and depression 01/10/2022   Gastroesophageal reflux disease 01/10/2022   Chronic migraine without aura without status migrainosus, not intractable 08/31/2018   Prediabetes 07/13/2017   At risk for diabetes mellitus 07/02/2017   Insulin resistance 07/02/2017   Vitamin D deficiency 07/02/2017   Other hyperlipidemia 07/02/2017   Morbid obesity (HCC) 06/18/2017   Chronic pain of both shoulders 11/02/2014   Arthritis of both knees 10/04/2014   Past Medical History:  Diagnosis Date   Anxiety    Arthritis    Depression    Gallbladder problem    GERD (gastroesophageal reflux disease)    High blood pressure    Hypertension    Migraine    Obesity    Obstructive sleep apnea    2016 was cleared from having to use CPAP at night. Gastric bypass surgery 2015.   Vitamin D deficiency    Family History  Problem Relation Age of Onset   Diabetes Maternal Grandmother    Heart disease Maternal Grandmother    Diabetes Other    Hyperlipidemia Other    Hypertension Other    BRCA 1/2 Neg Hx    Past Surgical History:  Procedure Laterality Date   ANTERIOR CERVICAL DECOMP/DISCECTOMY FUSION N/A 10/02/2022   Procedure:  ANTERIOR CERVICAL DISCECTOMY AND FUSION CERVICAL FOUR TO SEVEN;  Surgeon: Venita Lick, MD;  Location: [facility] OR;  Service: Orthopedics;  Laterality: N/A;  4hrs 3 C-Bed   CHOLECYSTECTOMY     FOOT SURGERY Left 07/26/2022   5th metatarsal   SLEEVE GASTROPLASTY     TUBAL LIGATION     Social History   Occupational History   Occupation: Waitress  Tobacco Use   Smoking status: Never   Smokeless tobacco: Never  Vaping Use   Vaping Use: Never used  Substance and Sexual Activity   Alcohol use: Not Currently    Comment: socially   Drug use: No   Sexual activity: Yes    Birth control/protection: Surgical

## 2023-04-20 DIAGNOSIS — M545 Low back pain, unspecified: Secondary | ICD-10-CM | POA: Insufficient documentation

## 2023-04-21 ENCOUNTER — Ambulatory Visit (HOSPITAL_BASED_OUTPATIENT_CLINIC_OR_DEPARTMENT_OTHER): Payer: Commercial Managed Care - HMO | Admitting: Physical Therapy

## 2023-05-10 ENCOUNTER — Telehealth: Payer: Commercial Managed Care - HMO | Admitting: Family

## 2023-05-10 DIAGNOSIS — J209 Acute bronchitis, unspecified: Secondary | ICD-10-CM

## 2023-05-10 MED ORDER — FLUTICASONE PROPIONATE 50 MCG/ACT NA SUSP
2.0000 | Freq: Every day | NASAL | 6 refills | Status: DC
Start: 2023-05-10 — End: 2024-09-12

## 2023-05-10 MED ORDER — BENZONATATE 100 MG PO CAPS
100.0000 mg | ORAL_CAPSULE | Freq: Three times a day (TID) | ORAL | 0 refills | Status: DC | PRN
Start: 2023-05-10 — End: 2023-09-10

## 2023-05-10 MED ORDER — PREDNISONE 10 MG (21) PO TBPK
ORAL_TABLET | ORAL | 0 refills | Status: DC
Start: 2023-05-10 — End: 2023-05-15

## 2023-05-10 NOTE — Patient Instructions (Signed)

## 2023-05-10 NOTE — Progress Notes (Signed)
Virtual Visit Consent   Kim Watson, you are scheduled for a virtual visit with a [facility] provider today. Just as with appointments in the office, your consent must be obtained to participate. Your consent will be active for this visit and any virtual visit you may have with one of our providers in the next 365 days. If you have a MyChart account, a copy of this consent can be sent to you electronically.  As this is a virtual visit, video technology does not allow for your provider to perform a traditional examination. This may limit your provider's ability to fully assess your condition. If your provider identifies any concerns that need to be evaluated in person or the need to arrange testing (such as labs, EKG, etc.), we will make arrangements to do so. Although advances in technology are sophisticated, we cannot ensure that it will always work on either your end or our end. If the connection with a video visit is poor, the visit may have to be switched to a telephone visit. With either a video or telephone visit, we are not always able to ensure that we have a secure connection.  By engaging in this virtual visit, you consent to the provision of healthcare and authorize for your insurance to be billed (if applicable) for the services provided during this visit. Depending on your insurance coverage, you may receive a charge related to this service.  I need to obtain your verbal consent now. Are you willing to proceed with your visit today? Kim Watson has provided verbal consent on 05/10/2023 for a virtual visit (video or telephone). Jannifer Rodney, FNP  Date: 05/10/2023 8:58 AM  Virtual Visit via Video Note   I, Jannifer Rodney, connected with  Kim Watson  ([id number], 01-23-1971) on 05/10/23 at  9:30 AM EDT by a video-enabled telemedicine application and verified that I am speaking with the correct person using two identifiers.  Location: Patient: Virtual Visit Location Patient:  Home Provider: Virtual Visit Location Provider: Home Office   I discussed the limitations of evaluation and management by telemedicine and the availability of in person appointments. The patient expressed understanding and agreed to proceed.    History of Present Illness: Kim Watson is a 52 y.o. who identifies as a female who was assigned female at birth, and is being seen today for cough that started two days ago, with ear pain.  HPI: Cough This is a new problem. The current episode started in the past 7 days. The problem has been unchanged. The problem occurs every few minutes. The cough is Productive of sputum. Associated symptoms include ear congestion, ear pain, a fever, headaches and a sore throat. Pertinent negatives include no chills, myalgias, nasal congestion, shortness of breath or wheezing. She has tried rest for the symptoms. The treatment provided mild relief.    Problems:  Patient Active Problem List   Diagnosis Date Noted   Back pain 04/14/2023   Symptomatic mammary hypertrophy 04/14/2023   Bilateral carpal tunnel syndrome 12/12/2022   Tendonitis 12/12/2022   Cervical myelopathy (HCC) 10/02/2022   Preop examination 09/16/2022   COVID-19 08/20/2022   Acute nonintractable headache 08/20/2022   Pain in right shoulder 06/27/2022   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/07/2022   Primary hypertension 05/07/2022   Neck pain 05/07/2022   Acute left-sided low back pain without sciatica 02/24/2022   Bursitis of left shoulder 01/10/2022   Dry mouth 01/10/2022   History of bariatric surgery 01/10/2022   Elevated blood  pressure reading 01/10/2022   Anxiety and depression 01/10/2022   Gastroesophageal reflux disease 01/10/2022   Chronic migraine without aura without status migrainosus, not intractable 08/31/2018   Prediabetes 07/13/2017   At risk for diabetes mellitus 07/02/2017   Insulin resistance 07/02/2017   Vitamin D deficiency 07/02/2017   Other hyperlipidemia 07/02/2017   Morbid  obesity (HCC) 06/18/2017   Chronic pain of both shoulders 11/02/2014   Arthritis of both knees 10/04/2014    Allergies:  Allergies  Allergen Reactions   Nsaids Other (See Comments)    History of gastric bypass   Zestril [Lisinopril] Cough        Medications:  Current Outpatient Medications:    benzonatate (TESSALON PERLES) 100 MG capsule, Take 1 capsule (100 mg total) by mouth 3 (three) times daily as needed., Disp: 20 capsule, Rfl: 0   fluticasone (FLONASE) 50 MCG/ACT nasal spray, Place 2 sprays into both nostrils daily., Disp: 16 g, Rfl: 6   predniSONE (STERAPRED UNI-PAK 21 TAB) 10 MG (21) TBPK tablet, Use as directed, Disp: 21 tablet, Rfl: 0   clonazePAM (KLONOPIN) 0.5 MG disintegrating tablet, DISSOLVE 1 TABLET(0.5 MG) ON THE TONGUE TWICE DAILY, Disp: 30 tablet, Rfl: 0   DULoxetine (CYMBALTA) 60 MG capsule, Take 1 capsule (60 mg total) by mouth daily., Disp: 90 capsule, Rfl: 1   losartan-hydrochlorothiazide (HYZAAR) 50-12.5 MG tablet, TAKE 1 TABLET BY MOUTH DAILY, Disp: 90 tablet, Rfl: 1   methocarbamol (ROBAXIN) 500 MG tablet, TAKE 1 TABLET BY MOUTH EVERY 8 HOURS AS NEEDED FOR UP TO 5 DAYS FOR MUSCLE SPASMS, Disp: , Rfl:    methylPREDNISolone (MEDROL DOSEPAK) 4 MG TBPK tablet, Take per packet instructions. Taper dosing., Disp: 1 each, Rfl: 0   nystatin (MYCOSTATIN/NYSTOP) powder, Apply 1 Application topically 3 (three) times daily., Disp: 60 g, Rfl: 3   ondansetron (ZOFRAN) 4 MG tablet, Take 1 tablet (4 mg total) by mouth every 8 (eight) hours as needed for nausea or vomiting., Disp: 20 tablet, Rfl: 0   pantoprazole (PROTONIX) 40 MG tablet, Take 1 tablet (40 mg total) by mouth daily., Disp: 90 tablet, Rfl: 1   pregabalin (LYRICA) 75 MG capsule, Take 1 capsule (75 mg total) by mouth 2 (two) times daily., Disp: 120 capsule, Rfl: 0   Vitamin D, Ergocalciferol, (DRISDOL) 1.25 MG (50000 UNIT) CAPS capsule, Take 1 capsule (50,000 Units total) by mouth once a week., Disp: 12 capsule, Rfl:  0  Observations/Objective: Patient is well-developed, well-nourished in no acute distress.  Resting comfortably  at home.  Head is normocephalic, atraumatic.  No labored breathing.  Speech is clear and coherent with logical content.  Patient is alert and oriented at baseline.  Tight nonproductive cough  Assessment and Plan: 1. Acute bronchitis, unspecified organism - predniSONE (STERAPRED UNI-PAK 21 TAB) 10 MG (21) TBPK tablet; Use as directed  Dispense: 21 tablet; Refill: 0 - benzonatate (TESSALON PERLES) 100 MG capsule; Take 1 capsule (100 mg total) by mouth 3 (three) times daily as needed.  Dispense: 20 capsule; Refill: 0 - fluticasone (FLONASE) 50 MCG/ACT nasal spray; Place 2 sprays into both nostrils daily.  Dispense: 16 g; Refill: 6  - Take meds as prescribed - Use a cool mist humidifier  -Use saline nose sprays frequently -Force fluids -For any cough or congestion  Use plain Mucinex- regular strength or max strength is fine -For fever or aces or pains- take tylenol or ibuprofen. -Throat lozenges if help -Follow up if symptoms worsen or do not improve   Follow  Up Instructions: I discussed the assessment and treatment plan with the patient. The patient was provided an opportunity to ask questions and all were answered. The patient agreed with the plan and demonstrated an understanding of the instructions.  A copy of instructions were sent to the patient via MyChart unless otherwise noted below.    The patient was advised to call back or seek an in-person evaluation if the symptoms worsen or if the condition fails to improve as anticipated.  Time:  I spent 8 minutes with the patient via telehealth technology discussing the above problems/concerns.    Jannifer Rodney, FNP

## 2023-05-13 ENCOUNTER — Encounter: Payer: Self-pay | Admitting: Sports Medicine

## 2023-05-14 ENCOUNTER — Ambulatory Visit (HOSPITAL_BASED_OUTPATIENT_CLINIC_OR_DEPARTMENT_OTHER): Payer: Commercial Managed Care - HMO | Attending: Orthopedic Surgery | Admitting: Physical Therapy

## 2023-05-14 DIAGNOSIS — M5459 Other low back pain: Secondary | ICD-10-CM | POA: Insufficient documentation

## 2023-05-14 DIAGNOSIS — M62838 Other muscle spasm: Secondary | ICD-10-CM | POA: Insufficient documentation

## 2023-05-14 NOTE — Therapy (Signed)
OUTPATIENT PHYSICAL THERAPY THORACOLUMBAR EVALUATION   Patient Name: NAMINE ECCLESTON MRN: [id number] DOB:1971/11/07, 52 y.o., female Today's Date: 05/15/2023  END OF SESSION:  PT End of Session - 05/14/23 1627     Visit Number 1    Number of Visits 14    Date for PT Re-Evaluation 08/06/23    Authorization Type [employer]    PT Start Time 1540    PT Stop Time 1626    PT Time Calculation (min) 46 min    Activity Tolerance Patient tolerated treatment well    Behavior During Therapy WFL for tasks assessed/performed             Past Medical History:  Diagnosis Date   Anxiety    Arthritis    Depression    Gallbladder problem    GERD (gastroesophageal reflux disease)    High blood pressure    Hypertension    Migraine    Obesity    Obstructive sleep apnea    2016 was cleared from having to use CPAP at night. Gastric bypass surgery 2015.   Vitamin D deficiency    Past Surgical History:  Procedure Laterality Date   ANTERIOR CERVICAL DECOMP/DISCECTOMY FUSION N/A 10/02/2022   Procedure: ANTERIOR CERVICAL DISCECTOMY AND FUSION CERVICAL FOUR TO SEVEN;  Surgeon: Venita Lick, MD;  Location: [facility] OR;  Service: Orthopedics;  Laterality: N/A;  4hrs 3 C-Bed   CHOLECYSTECTOMY     FOOT SURGERY Left 07/26/2022   5th metatarsal   SLEEVE GASTROPLASTY     TUBAL LIGATION     Patient Active Problem List   Diagnosis Date Noted   Bronchitis 05/15/2023   Back pain 04/14/2023   Symptomatic mammary hypertrophy 04/14/2023   Bilateral carpal tunnel syndrome 12/12/2022   Tendonitis 12/12/2022   Cervical myelopathy (HCC) 10/02/2022   Preop examination 09/16/2022   COVID-19 08/20/2022   Acute nonintractable headache 08/20/2022   Pain in right shoulder 06/27/2022   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/07/2022   Primary hypertension 05/07/2022   Neck pain 05/07/2022   Acute left-sided low back pain without sciatica 02/24/2022   Bursitis of left shoulder 01/10/2022   Dry mouth 01/10/2022   History of  bariatric surgery 01/10/2022   Elevated blood pressure reading 01/10/2022   Anxiety and depression 01/10/2022   Gastroesophageal reflux disease 01/10/2022   Chronic migraine without aura without status migrainosus, not intractable 08/31/2018   Prediabetes 07/13/2017   At risk for diabetes mellitus 07/02/2017   Insulin resistance 07/02/2017   Vitamin D deficiency 07/02/2017   Other hyperlipidemia 07/02/2017   Morbid obesity (HCC) 06/18/2017   Chronic pain of both shoulders 11/02/2014   Arthritis of both knees 10/04/2014     REFERRING PROVIDER: Venita Lick, MD  REFERRING DIAG: M54.51 (ICD-10-CM) - Vertebrogenic low back pain  Rationale for Evaluation and Treatment: Rehabilitation  THERAPY DIAG:  Other low back pain  Other muscle spasm  ONSET DATE: Chronic / Exacerbation March 2023  SUBJECTIVE:  SUBJECTIVE STATEMENT: Pt has has back pain for approx 10 years with no specific MOI.  Pt reports her back improved after bariatric surgery in 2015.  Pt used heat and ice to manage pain.  Pt sneezed in March 2023 and had significant pain. She then began dragging her L LE.  Pt had PT in April 2023 and reports improved sx's.  She wasn't having groin pain anymore and had reduced excruciating pain.  Pt states she lifted an abcoaster about 1.5 months ago and had increased pain.  Pt saw Dr. Shon Baton and had x rays.  Pt was informed her vertebrae have not slipped any further.  PT order on 03/25/23 indicates aquatic therapy.  Pt states her L LE occasionally wants to give way.  Pt has pain with holding her grandson.  Pt has pain with bending.  Pt has pain with car transfers, not her SUV.  She has pain with getting OOB.  Pt does have increased pain with ambulation.  Pt is limited with activities with her grandchildren.  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  She has increased pain after Wednesday shifts.   Pt is planning on having another bariatric surgery and breast reduction surgery.   PERTINENT HISTORY:  MRI findings including grade 1 anterolisthesis of L4 on L5 Arthritis in bilat knees, Thoracic pain, obesity, migraines, anxiety, and depression HTN, R thumb [facility] OA, Bilat carpal tunnel syndrome PSHx:  Anterior cervical fusion (C4-C7) in 10/2022, Bariatric surgery, L foot surgery 5th MT in 2023     PAIN:  Are you having pain? Yes Location:  L sided lower lumbar pain which travels to L glute and proximal HS NPRS:  4/10 current, 9/10 worst, 4/10 best Type:  Constant pain Pt does have some N/T in L thigh  PRECAUTIONS: Other: Grade 1 anterolisthesis of L4-L5, C4-C7 fusion  WEIGHT BEARING RESTRICTIONS: No  FALLS:  Has patient fallen in last 6 months? 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   PATIENT GOALS: to be able to active with grandkids, to be pain free   OBJECTIVE:   DIAGNOSTIC FINDINGS:  Lumbar MRI in 2023: IMPRESSION: 1. Slight progression of previously noted grade 1 anterolisthesis of L4 on L5, with new right paracentral disc extrusion with 6 mm cranial migration and new mild-to-moderate spinal canal stenosis. Unchanged mild left and mild-to-moderate right neural foraminal narrowing. Effacement of the lateral recesses at this level likely affects the descending L5 nerve roots. 2. Multilevel facet arthropathy, which is moderate at L3-L4 and severe at L4-L5, which can also be a source of back pain.  PATIENT SURVEYS:  FOTO 26 with a goal of 42 at visit #13   COGNITION: Overall cognitive status: Within functional limits for tasks assessed      PALPATION: Lumbar paraspinal soft  tissue mobility:  R:  WFL, L:  moderate tightness Pt is very tender to palpate L glute and piriformis  LUMBAR ROM:   AROM eval  Flexion WFL  Extension   Right lateral flexion 80%  Left lateral flexion 80%  Right rotation WFL  Left rotation WFL   (Blank rows = not tested)   LOWER EXTREMITY MMT:    MMT Right eval Left eval  Hip flexion 5/5 5/5  Hip extension    Hip abduction    Hip  adduction    Hip internal rotation    Hip external rotation    Knee flexion 5/5 seated 5/5 seated  Knee extension 5/5 5/5  Ankle dorsiflexion 5/5 5/5  Ankle plantarflexion WFL seated WFL seated  Ankle inversion    Ankle eversion     (Blank rows = not tested)  LUMBAR SPECIAL TESTS:  Supine SLR test:  R:  negative, L: positive   TODAY'S TREATMENT:                                                                                                                               See below for pt education    PATIENT EDUCATION:  Education details: PT educated pt in the aquatic therapy process and aquatic benefits, purpose, and properties.  PT educated pt in objective findings, dx, POC, rationale of interventions, relevant anatomy, and prognosis.   Person educated: Patient Education method: Explanation Education comprehension: verbalized understanding  HOME EXERCISE PROGRAM: Pt has a HEP.  Will give further HEP at a later date.   ASSESSMENT:  CLINICAL IMPRESSION: Patient is a 52 y.o. female with a dx of pain in lumbar spine/vertebrogenic LBP.  Pt received PT last year and had improved sx's.  She presents to PT today with a MD order for aquatic therapy.  Pt has pain with daily activities including bending, getting OOB, car transfers, and ambulation.  Pt states her L LE occasionally wants to give way.  Pt is limited with activities with her grandchildren and has pain with holding her grandson.  She has increased pain with working in a stooped position preparing food at work for a prolonged amount of  time.  Pt has moderate soft tissue tightness in L sided lumbar paraspinals and is very tender to palpate L glute and piriformis.  Pt should benefit from skilled PT services including aquatic therapy to address impairments and improve overall function.    OBJECTIVE IMPAIRMENTS: decreased activity tolerance, decreased mobility, difficulty walking, decreased ROM, decreased strength, hypomobility, increased fascial restrictions, increased muscle spasms, impaired flexibility, and pain.   ACTIVITY LIMITATIONS: bending, transfers, bed mobility, and locomotion level  PARTICIPATION LIMITATIONS: occupation and taking care of granchildren  PERSONAL FACTORS: Time since onset of injury/illness/exacerbation and 3+ comorbidities: Arthritis in bilat knees, Thoracic pain, obesity, cervical fusion, anxiety, and depression  are also affecting patient's functional outcome.   REHAB POTENTIAL: Good  CLINICAL DECISION MAKING: Evolving/moderate complexity  EVALUATION COMPLEXITY: Moderate   GOALS:  SHORT TERM GOALS:  Pt will tolerate aquatic therapy without adverse effects for improved core strength, pain, function, and tolerance to activity.  Baseline: Goal status: INITIAL Target date:  06/04/2023  2.  Pt will demonstrate improved soft tissue tightness in L sided lumbar paraspinals and reduced tenderness in glute and piriformis for improved pain, tension, mobility, and ease with daily activities.  Baseline:  Goal status: INITIAL Target date:  06/18/2023   3.  Pt will report at least a 25%  improvement in pain and sx's overall.  Baseline:  Goal status: INITIAL Target date:  06/11/2023  4.  Pt will progress with aquatic exercises without adverse effects for improved core strength, mobility and improved tolerance with daily activities and work activities Baseline:  Goal status: INITIAL    LONG TERM GOALS: Target date: 08/06/2023  Pt will be able to ambulate her normal community distance and perform her  normal work activities without her L LE wanting to give way.  Baseline:  Goal status: INITIAL  2.  Pt will report at least a 70% improvement in pain with work activities.  Baseline:  Goal status: INITIAL  3.  Pt will demo proper body mechanics with hip hinge/squatting to lift instead of bending to reduce lumbar stress and strain with functional activities at home and at work.  Baseline:  Goal status: INITIAL  4.  Pt will be independent with aquatic and land based HEP for improved core strength, pain, tolerance to activity, and function.   Baseline:  Goal status: INITIAL  5.  Pt will be able to perform her ADLs and IADLs without significant pain and difficulty.  Baseline:  Goal status: INITIAL    PLAN:  PT FREQUENCY: 2x/week  PT DURATION: 12 weeks  PLANNED INTERVENTIONS: Therapeutic exercises, Therapeutic activity, Neuromuscular re-education, Balance training, Gait training, Patient/Family education, Self Care, Joint mobilization, Stair training, Aquatic Therapy, Dry Needling, Electrical stimulation, Spinal mobilization, Cryotherapy, Moist heat, Taping, Ultrasound, Manual therapy, and Re-evaluation.  PLAN FOR NEXT SESSION: Cont with aquatic therapy.  Core strengthening and STW in land therapy.  PT will be interrupted due to Pt planning on having  bariatric surgery and breast reduction surgery.  PT instructed she will but put on hold during those time and will need to be cleared by MD to return to PT.  Pt agreeable.   Audie Clear III PT, DPT 05/15/23 3:57 PM

## 2023-05-15 ENCOUNTER — Other Ambulatory Visit: Payer: Self-pay

## 2023-05-15 ENCOUNTER — Encounter: Payer: Self-pay | Admitting: Nurse Practitioner

## 2023-05-15 ENCOUNTER — Ambulatory Visit (INDEPENDENT_AMBULATORY_CARE_PROVIDER_SITE_OTHER): Payer: Commercial Managed Care - HMO | Admitting: Nurse Practitioner

## 2023-05-15 ENCOUNTER — Encounter (HOSPITAL_BASED_OUTPATIENT_CLINIC_OR_DEPARTMENT_OTHER): Payer: Self-pay | Admitting: Physical Therapy

## 2023-05-15 ENCOUNTER — Other Ambulatory Visit: Payer: Self-pay | Admitting: Sports Medicine

## 2023-05-15 VITALS — BP 134/76 | HR 75 | Temp 97.5°F | Ht 66.0 in | Wt 280.4 lb

## 2023-05-15 DIAGNOSIS — L920 Granuloma annulare: Secondary | ICD-10-CM

## 2023-05-15 DIAGNOSIS — J40 Bronchitis, not specified as acute or chronic: Secondary | ICD-10-CM | POA: Diagnosis not present

## 2023-05-15 MED ORDER — DOXYCYCLINE HYCLATE 100 MG PO TABS
100.0000 mg | ORAL_TABLET | Freq: Two times a day (BID) | ORAL | 0 refills | Status: DC
Start: 2023-05-15 — End: 2023-09-10

## 2023-05-15 MED ORDER — CLOTRIMAZOLE-BETAMETHASONE 1-0.05 % EX CREA: 1.0000 | TOPICAL_CREAM | Freq: Every day | CUTANEOUS | 0 refills | Status: DC

## 2023-05-15 MED ORDER — PREDNISONE 10 MG PO TABS: ORAL_TABLET | ORAL | 0 refills | Status: DC

## 2023-05-15 MED ORDER — GABAPENTIN 300 MG PO CAPS: 300.0000 mg | ORAL_CAPSULE | Freq: Two times a day (BID) | ORAL | 1 refills | Status: DC

## 2023-05-15 NOTE — Assessment & Plan Note (Addendum)
Continues to have coughing symptoms from bronchitis. Cough is productive and course. Continue tesselon perels, robitussin cough syrup as needed. Continue  to drink plenty of fluids to help thin mucus. Prescribed additional  prednisone  10 mg taper starting with 6 tablets today, then 5 tablets tomorrow, then decrease by 1 tablet every day until gone. Start Doxycyline 100 mg 1 tablet twice per day secondary to history of pneumonia. Please follow-up if no improvement or symptoms worsens.

## 2023-05-15 NOTE — Assessment & Plan Note (Signed)
Skin rash on legs and elbows bilaterally. Rash on legs has increased in size and has become a darker red in color. Rash is not itchy. Prescribed Clotrimazole -betamethasone cream daily. Follow-up if rash does not Improve or become worse.

## 2023-05-15 NOTE — Patient Instructions (Addendum)
It was great to see you!  Start doxycycline 1 tablet twice a day for 7 days.  Start another prednisone taper, 6 tablets today, 5 tomorrow, 4 the next day, then keep decreasing by 1 until gone.   I have sent in some cream to use once a day on your granuloma annulare.   You can keep taking the cough medicine and the sinus medicine.   Let's follow-up if your symptoms worsen or don't improve.   Take care,  Rodman Pickle, NP

## 2023-05-15 NOTE — Progress Notes (Signed)
Acute Office Visit  Subjective:     Patient ID: Kim Watson, female    DOB: July 20, 1971, 52 y.o.   MRN: [id number]  Chief Complaint  Patient presents with   Bronchitis    Burning sensation in chest, coughing a lot, finished medication-Prednisone    HPI Patient is in today for lingering cough that started 6/7. Patient was seen on 6/9 via tele visit and was given diagnosis of bronchitis. She was prescribed a prednisone taper and completed the course. Patient states she continues to have a productive cough in conjunction with a burning sensation in her chest when she coughs. She describes the mucus as thick  and yellow in color and consistency. She expressed having some ear pain bilaterally with cough. She reveals she has been out side lots working on her house and when symptoms started her eyes were extremely itchy however that has subsided. She denies fever, sore throat or being around any sick contacts.   In addition, she has concerns about a skin rash on her legs and elbows bilaterally. The rashes are not itchy however they are becoming darker and larger since her last visit and she has notice the rash is  spreading to lower anterior  portion of her left leg.    Review of Systems  Constitutional:  Negative for fever.  HENT:  Positive for ear pain and tinnitus. Negative for sore throat.   Eyes:  Positive for pain and redness.       Eyes were red and itchy for first two days of cough.  Has resolved.   Respiratory:  Positive for cough. Negative for sputum production, shortness of breath and wheezing.   Cardiovascular:  Negative for chest pain and palpitations.  Gastrointestinal: Negative.   Genitourinary: Negative.   Musculoskeletal: Negative.   Skin:  Positive for rash. Negative for itching.  Neurological:  Positive for headaches.  Psychiatric/Behavioral: Negative.          Objective:    BP 134/76 (BP Location: Left Arm)   Pulse 75   Temp (!) 97.5 F (36.4 C)   Ht 5\' 6"   (1.676 m)   Wt 280 lb 6.4 oz (127.2 kg)   SpO2 97%   BMI 45.26 kg/m    Physical Exam Constitutional:      Appearance: Normal appearance. She is obese. She is not ill-appearing.  HENT:     Head: Normocephalic and atraumatic.     Right Ear: Ear canal and external ear normal.     Left Ear: Tympanic membrane, ear canal and external ear normal.     Ears:     Comments: Right TM mildly irritated     Nose: Congestion and rhinorrhea present.     Mouth/Throat:     Mouth: Mucous membranes are moist.     Pharynx: No oropharyngeal exudate or posterior oropharyngeal erythema.  Eyes:     Extraocular Movements: Extraocular movements intact.     Conjunctiva/sclera: Conjunctivae normal.  Cardiovascular:     Rate and Rhythm: Normal rate and regular rhythm.     Pulses: Normal pulses.     Heart sounds: Normal heart sounds.     No friction rub. No gallop.  Pulmonary:     Effort: Pulmonary effort is normal. No respiratory distress.     Breath sounds: Rhonchi present. No wheezing.     Comments: Burning sensation in chest secondary to frequent coughing. Musculoskeletal:        General: Normal range of motion.  Cervical back: Normal range of motion.  Skin:    General: Skin is warm and dry.     Capillary Refill: Capillary refill takes less than 2 seconds.     Findings: Erythema and rash present.     Comments: Rash on bilateral legs and elbows.   Neurological:     Mental Status: She is alert and oriented to person, place, and time.  Psychiatric:        Mood and Affect: Mood normal.        Behavior: Behavior normal.        Thought Content: Thought content normal.        Judgment: Judgment normal.     No results found for any visits on 05/15/23.      Assessment & Plan:   Problem List Items Addressed This Visit       Respiratory   Bronchitis - Primary    Continues to have coughing symptoms from bronchitis. Cough is productive and course. Continue tesselon perels, robitussin cough  syrup as needed. Continue  to drink plenty of fluids to help thin mucus. Prescribed additional  prednisone  10 mg taper starting with 6 tablets today, then 5 tablets tomorrow, then decrease by 1 tablet every day until gone. Start Doxycyline 100 mg 1 tablet twice per day secondary to history of pneumonia. Please follow-up if no improvement or symptoms worsens.        Musculoskeletal and Integument   Granuloma annulare    Skin rash on legs and elbows bilaterally. Rash on legs has increased in size and has become a darker red in color. Rash is not itchy. Prescribed Clotrimazole -betamethasone cream daily. Follow-up if rash does not Improve or become worse.       Meds ordered this encounter  Medications   doxycycline (VIBRA-TABS) 100 MG tablet    Sig: Take 1 tablet (100 mg total) by mouth 2 (two) times daily.    Dispense:  14 tablet    Refill:  0   predniSONE (DELTASONE) 10 MG tablet    Sig: Take 6 tablets today, then 5 tablets tomorrow, then decrease by 1 tablet every day until gone    Dispense:  21 tablet    Refill:  0   clotrimazole-betamethasone (LOTRISONE) cream    Sig: Apply 1 Application topically daily.    Dispense:  30 g    Refill:  0    Return if symptoms worsen or fail to improve.  Bishop Dublin, RN

## 2023-05-20 ENCOUNTER — Encounter (HOSPITAL_BASED_OUTPATIENT_CLINIC_OR_DEPARTMENT_OTHER): Payer: Self-pay | Admitting: Physical Therapy

## 2023-05-20 ENCOUNTER — Ambulatory Visit (HOSPITAL_BASED_OUTPATIENT_CLINIC_OR_DEPARTMENT_OTHER): Payer: Commercial Managed Care - HMO | Admitting: Physical Therapy

## 2023-05-20 DIAGNOSIS — M5459 Other low back pain: Secondary | ICD-10-CM

## 2023-05-20 DIAGNOSIS — M62838 Other muscle spasm: Secondary | ICD-10-CM

## 2023-05-20 NOTE — Therapy (Signed)
OUTPATIENT PHYSICAL THERAPY THORACOLUMBAR TREATMENT   Patient Name: Kim Watson MRN: [id number] DOB:1971-01-16, 52 y.o., female Today's Date: 05/20/2023  END OF SESSION:  PT End of Session - 05/20/23 1333     Visit Number 2    Number of Visits 14    Date for PT Re-Evaluation 08/06/23    Authorization Type [employer]    PT Start Time 1332    PT Stop Time 1410    PT Time Calculation (min) 38 min    Activity Tolerance Patient tolerated treatment well    Behavior During Therapy WFL for tasks assessed/performed             Past Medical History:  Diagnosis Date   Anxiety    Arthritis    Depression    Gallbladder problem    GERD (gastroesophageal reflux disease)    High blood pressure    Hypertension    Migraine    Obesity    Obstructive sleep apnea    2016 was cleared from having to use CPAP at night. Gastric bypass surgery 2015.   Vitamin D deficiency    Past Surgical History:  Procedure Laterality Date   ANTERIOR CERVICAL DECOMP/DISCECTOMY FUSION N/A 10/02/2022   Procedure: ANTERIOR CERVICAL DISCECTOMY AND FUSION CERVICAL FOUR TO SEVEN;  Surgeon: Venita Lick, MD;  Location: [facility] OR;  Service: Orthopedics;  Laterality: N/A;  4hrs 3 C-Bed   CHOLECYSTECTOMY     FOOT SURGERY Left 07/26/2022   5th metatarsal   SLEEVE GASTROPLASTY     TUBAL LIGATION     Patient Active Problem List   Diagnosis Date Noted   Bronchitis 05/15/2023   Back pain 04/14/2023   Symptomatic mammary hypertrophy 04/14/2023   Bilateral carpal tunnel syndrome 12/12/2022   Tendonitis 12/12/2022   Cervical myelopathy (HCC) 10/02/2022   Preop examination 09/16/2022   COVID-19 08/20/2022   Acute nonintractable headache 08/20/2022   Pain in right shoulder 06/27/2022   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/07/2022   Primary hypertension 05/07/2022   Neck pain 05/07/2022   Acute left-sided low back pain without sciatica 02/24/2022   Bursitis of left shoulder 01/10/2022   Dry mouth 01/10/2022   History of  bariatric surgery 01/10/2022   Elevated blood pressure reading 01/10/2022   Anxiety and depression 01/10/2022   Gastroesophageal reflux disease 01/10/2022   Chronic migraine without aura without status migrainosus, not intractable 08/31/2018   Prediabetes 07/13/2017   At risk for diabetes mellitus 07/02/2017   Insulin resistance 07/02/2017   Vitamin D deficiency 07/02/2017   Other hyperlipidemia 07/02/2017   Morbid obesity (HCC) 06/18/2017   Chronic pain of both shoulders 11/02/2014   Arthritis of both knees 10/04/2014     REFERRING PROVIDER: Venita Lick, MD  REFERRING DIAG: M54.51 (ICD-10-CM) - Vertebrogenic low back pain  Rationale for Evaluation and Treatment: Rehabilitation  THERAPY DIAG:  Other low back pain  Other muscle spasm  ONSET DATE: Chronic / Exacerbation March 2023  SUBJECTIVE:  SUBJECTIVE STATEMENT: 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 Pt reports she doesn't know how to swim,but is not afraid of the water.     From eval:  Pt has has back pain for approx 10 years with no specific MOI.  Pt reports her back improved after bariatric surgery in 2015.  Pt used heat and ice to manage pain.  Pt sneezed in March 2023 and had significant pain. She then began dragging her L LE.  Pt had PT in April 2023 and reports improved sx's.  She wasn't having groin pain anymore and had reduced excruciating pain.  Pt states she lifted an abcoaster about 1.5 months ago and had increased pain.  Pt saw Dr. Shon Baton and had x rays.  Pt was informed her vertebrae have not slipped any further.  PT order on 03/25/23 indicates aquatic therapy.  Pt states her L LE occasionally wants to give way.  Pt has pain with holding her grandson.  Pt has pain with bending.  Pt has pain  with car transfers, not her SUV.  She has pain with getting OOB.  Pt does have increased pain with ambulation.  Pt is limited with activities with her grandchildren.  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  She has increased pain after Wednesday shifts.   Pt is planning on having another bariatric surgery and breast reduction surgery.   PERTINENT HISTORY:  MRI findings including grade 1 anterolisthesis of L4 on L5 Arthritis in bilat knees, Thoracic pain, obesity, migraines, anxiety, and depression HTN, R thumb CMC OA, Bilat carpal tunnel syndrome PSHx:  Anterior cervical fusion (C4-C7) in 10/2022, Bariatric surgery, L foot surgery 5th MT in 2023     PAIN:  Are you having pain? Yes Location:  L sided lower lumbar pain which travels to L glute  NPRS:  6/10 Type:  Constant pain Pt does have some N/T in L thigh  PRECAUTIONS: Other: Grade 1 anterolisthesis of L4-L5, C4-C7 fusion  WEIGHT BEARING RESTRICTIONS: No  FALLS:  Has patient fallen in last 6 months? 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   PATIENT GOALS: to be able to active with grandkids, to be pain free   OBJECTIVE:   DIAGNOSTIC FINDINGS:  Lumbar MRI in 2023: IMPRESSION: 1. Slight progression of previously noted grade 1 anterolisthesis of L4 on L5, with new right paracentral disc extrusion with 6 mm cranial migration and new mild-to-moderate spinal canal stenosis. Unchanged mild left and mild-to-moderate right neural foraminal narrowing. Effacement of the lateral recesses at this level likely affects the descending L5 nerve roots. 2. Multilevel facet arthropathy, which is moderate at L3-L4 and severe at L4-L5, which can also be a source of back pain.  PATIENT SURVEYS:  FOTO 26 with a goal of 42 at visit  #13   COGNITION: Overall cognitive status: Within functional limits for tasks assessed      PALPATION: Lumbar paraspinal soft tissue mobility:  R:  WFL, L:  moderate tightness Pt is very tender to palpate L glute and piriformis  LUMBAR ROM:   AROM eval  Flexion WFL  Extension   Right lateral flexion 80%  Left lateral flexion 80%  Right rotation WFL  Left rotation WFL   (Blank rows = not tested)  LOWER EXTREMITY MMT:    MMT Right eval Left eval  Hip flexion 5/5 5/5  Hip extension    Hip abduction    Hip adduction    Hip internal rotation    Hip external rotation    Knee flexion 5/5 seated 5/5 seated  Knee extension 5/5 5/5  Ankle dorsiflexion 5/5 5/5  Ankle plantarflexion WFL seated WFL seated  Ankle inversion    Ankle eversion     (Blank rows = not tested)  LUMBAR SPECIAL TESTS:  Supine SLR test:  R:  negative, L: positive   TODAY'S TREATMENT:                                                                                                                              Pt seen for aquatic therapy today.  Treatment took place in water 3.5-4.75 ft in depth at the [facility] pool. Temp of water was 91.  Pt entered/exited the pool via stairs independently with bilat rail. * intro to aquatic therapy principles * unsupported: walking forward / backwards with reciprocal arm swing , side stepping  * high knee marching with alternating row with rainbow hand floats;  side stepping with arm addct with rainbow floats * TrA set with blue noodle (with hole) pull downs to leg (in standard stance x 10, staggered stance x 5)  * staggered stance with bilat horiz abdct/ addct with rainbow hand floats, then kick board row  * straddling noodle, holding corner: cycling ->  holding yellow hand floats and cycling  Pt requires the buoyancy and hydrostatic pressure of water for support, and to offload joints by unweighting joint load by at least 50 % in navel deep water and by  at least 75-80% in chest to neck deep water.  Viscosity of the water is needed for resistance of strengthening. Water current perturbations provides challenge to standing balance requiring increased core activation.     PATIENT EDUCATION:  Education details: aquatic therapy introduction    Person educated: Patient Education method: Explanation Education comprehension: verbalized understanding  HOME EXERCISE PROGRAM: Pt has a HEP.  Will give further HEP at a later date.   ASSESSMENT:  CLINICAL IMPRESSION: Pt is confident in aquatic setting and able to take direction from therapist on deck.  Pt reports gradual reduction of pain during session.  She requires minor cues for neutral head and upright posture. She gets most relief with lumbar ext and scap retraction.  She can ambulate without UE support, but needs UE support if vertically suspended on noodle. Pt should benefit from skilled PT services including aquatic therapy to address impairments and improve overall function.  Goals are ongoing.    OBJECTIVE IMPAIRMENTS: decreased activity tolerance, decreased mobility, difficulty walking, decreased ROM, decreased strength, hypomobility, increased fascial restrictions, increased muscle spasms, impaired flexibility, and pain.   ACTIVITY LIMITATIONS: bending, transfers, bed mobility, and locomotion level  PARTICIPATION LIMITATIONS: occupation and taking care of granchildren  PERSONAL  FACTORS: Time since onset of injury/illness/exacerbation and 3+ comorbidities: Arthritis in bilat knees, Thoracic pain, obesity, cervical fusion, anxiety, and depression  are also affecting patient's functional outcome.   REHAB POTENTIAL: Good  CLINICAL DECISION MAKING: Evolving/moderate complexity  EVALUATION COMPLEXITY: Moderate   GOALS:  SHORT TERM GOALS:  Pt will tolerate aquatic therapy without adverse effects for improved core strength, pain, function, and tolerance to activity.  Baseline: Goal  status: INITIAL Target date:  06/04/2023  2.  Pt will demonstrate improved soft tissue tightness in L sided lumbar paraspinals and reduced tenderness in glute and piriformis for improved pain, tension, mobility, and ease with daily activities.  Baseline:  Goal status: INITIAL Target date:  06/18/2023   3.  Pt will report at least a 25% improvement in pain and sx's overall.  Baseline:  Goal status: INITIAL Target date:  06/11/2023  4.  Pt will progress with aquatic exercises without adverse effects for improved core strength, mobility and improved tolerance with daily activities and work activities Baseline:  Goal status: INITIAL    LONG TERM GOALS: Target date: 08/06/2023  Pt will be able to ambulate her normal community distance and perform her normal work activities without her L LE wanting to give way.  Baseline:  Goal status: INITIAL  2.  Pt will report at least a 70% improvement in pain with work activities.  Baseline:  Goal status: INITIAL  3.  Pt will demo proper body mechanics with hip hinge/squatting to lift instead of bending to reduce lumbar stress and strain with functional activities at home and at work.  Baseline:  Goal status: INITIAL  4.  Pt will be independent with aquatic and land based HEP for improved core strength, pain, tolerance to activity, and function.   Baseline:  Goal status: INITIAL  5.  Pt will be able to perform her ADLs and IADLs without significant pain and difficulty.  Baseline:  Goal status: INITIAL    PLAN:  PT FREQUENCY: 2x/week  PT DURATION: 12 weeks  PLANNED INTERVENTIONS: Therapeutic exercises, Therapeutic activity, Neuromuscular re-education, Balance training, Gait training, Patient/Family education, Self Care, Joint mobilization, Stair training, Aquatic Therapy, Dry Needling, Electrical stimulation, Spinal mobilization, Cryotherapy, Moist heat, Taping, Ultrasound, Manual therapy, and Re-evaluation.  PLAN FOR NEXT SESSION: Cont  with aquatic therapy.  Core strengthening and STW in land therapy.  PT will be interrupted due to Pt planning on having  bariatric surgery and breast reduction surgery.  PT instructed she will but put on hold during those time and will need to be cleared by MD to return to PT.  Pt agreeable.  Mayer Camel, PTA 05/20/23 2:13 PM [facility] [address] [place], [place], [address] Phone: [phone]   Fax:  [phone]

## 2023-05-22 ENCOUNTER — Ambulatory Visit (HOSPITAL_BASED_OUTPATIENT_CLINIC_OR_DEPARTMENT_OTHER): Payer: Commercial Managed Care - HMO | Admitting: Physical Therapy

## 2023-05-22 ENCOUNTER — Telehealth (HOSPITAL_BASED_OUTPATIENT_CLINIC_OR_DEPARTMENT_OTHER): Payer: Self-pay | Admitting: Physical Therapy

## 2023-05-22 ENCOUNTER — Ambulatory Visit (HOSPITAL_BASED_OUTPATIENT_CLINIC_OR_DEPARTMENT_OTHER): Payer: Self-pay | Admitting: Physical Therapy

## 2023-05-22 NOTE — Telephone Encounter (Signed)
Patient did not show for aquatic PT appointment.  Called and left message regarding missed visit, and requested patient return phone call to confirm next upcoming appointment.  [phone].  Mayer Camel, PTA 05/22/23 1:24 PM [facility] [address] [place], [place], [address] Phone: [phone]   Fax:  [phone]

## 2023-05-26 NOTE — Therapy (Signed)
OUTPATIENT PHYSICAL THERAPY THORACOLUMBAR TREATMENT   Patient Name: Kim Watson MRN: [id number] DOB:Nov 01, 1971, 52 y.o., female Today's Date: 05/28/2023  END OF SESSION:  PT End of Session - 05/27/23 1033     Visit Number 3    Number of Visits 14    Date for PT Re-Evaluation 08/06/23    Authorization Type [employer]    PT Start Time 1032    PT Stop Time 1111    PT Time Calculation (min) 39 min    Activity Tolerance Patient tolerated treatment well    Behavior During Therapy WFL for tasks assessed/performed              Past Medical History:  Diagnosis Date   Anxiety    Arthritis    Depression    Gallbladder problem    GERD (gastroesophageal reflux disease)    High blood pressure    Hypertension    Migraine    Obesity    Obstructive sleep apnea    2016 was cleared from having to use CPAP at night. Gastric bypass surgery 2015.   Vitamin D deficiency    Past Surgical History:  Procedure Laterality Date   ANTERIOR CERVICAL DECOMP/DISCECTOMY FUSION N/A 10/02/2022   Procedure: ANTERIOR CERVICAL DISCECTOMY AND FUSION CERVICAL FOUR TO SEVEN;  Surgeon: Venita Lick, MD;  Location: [facility] OR;  Service: Orthopedics;  Laterality: N/A;  4hrs 3 C-Bed   CHOLECYSTECTOMY     FOOT SURGERY Left 07/26/2022   5th metatarsal   SLEEVE GASTROPLASTY     TUBAL LIGATION     Patient Active Problem List   Diagnosis Date Noted   Bronchitis 05/15/2023   Back pain 04/14/2023   Symptomatic mammary hypertrophy 04/14/2023   Bilateral carpal tunnel syndrome 12/12/2022   Tendonitis 12/12/2022   Cervical myelopathy (HCC) 10/02/2022   Preop examination 09/16/2022   COVID-19 08/20/2022   Acute nonintractable headache 08/20/2022   Pain in right shoulder 06/27/2022   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/07/2022   Primary hypertension 05/07/2022   Neck pain 05/07/2022   Acute left-sided low back pain without sciatica 02/24/2022   Bursitis of left shoulder 01/10/2022   Dry mouth 01/10/2022   History of  bariatric surgery 01/10/2022   Elevated blood pressure reading 01/10/2022   Anxiety and depression 01/10/2022   Gastroesophageal reflux disease 01/10/2022   Chronic migraine without aura without status migrainosus, not intractable 08/31/2018   Prediabetes 07/13/2017   At risk for diabetes mellitus 07/02/2017   Insulin resistance 07/02/2017   Vitamin D deficiency 07/02/2017   Other hyperlipidemia 07/02/2017   Morbid obesity (HCC) 06/18/2017   Chronic pain of both shoulders 11/02/2014   Arthritis of both knees 10/04/2014     REFERRING PROVIDER: Venita Lick, MD  REFERRING DIAG: M54.51 (ICD-10-CM) - Vertebrogenic low back pain  Rationale for Evaluation and Treatment: Rehabilitation  THERAPY DIAG:  Other low back pain  Other muscle spasm  ONSET DATE: Chronic / Exacerbation March 2023  SUBJECTIVE:  SUBJECTIVE STATEMENT: Pt reports having some soreness after prior Rx though no adverse effects.  Pt states she hurt a lot last Friday and missed work.  She had to take a mm relaxer.  Pt reports she felt better Saturday and Sunday.  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  Pt reports she doesn't know how to swim.  Pt performed the stairs yesterday instead of the elevator in [facility] and her back and knees did ok.  Pt was excited about how her knees felt doing the stairs.  Pt is performing her T-band home exercises from her prior PT.   PERTINENT HISTORY:  MRI findings including grade 1 anterolisthesis of L4 on L5 Arthritis in bilat knees, Thoracic pain, obesity, migraines, anxiety, and depression HTN, R thumb CMC OA, Bilat carpal tunnel syndrome PSHx:  Anterior cervical fusion (C4-C7) in 10/2022, Bariatric surgery, L foot surgery 5th MT in 2023      PAIN:  Are you having pain? Yes Location:  L sided lower lumbar pain which travels to L glute  NPRS:  8/10 Type:  Constant pain; feels a pinch Pt does have some N/T in L thigh  PRECAUTIONS: Other: Grade 1 anterolisthesis of L4-L5, C4-C7 fusion  WEIGHT BEARING RESTRICTIONS: No  FALLS:  Has patient fallen in last 6 months? 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   PATIENT GOALS: to be able to active with grandkids, to be pain free   OBJECTIVE:   DIAGNOSTIC FINDINGS:  Lumbar MRI in 2023: IMPRESSION: 1. Slight progression of previously noted grade 1 anterolisthesis of L4 on L5, with new right paracentral disc extrusion with 6 mm cranial migration and new mild-to-moderate spinal canal stenosis. Unchanged mild left and mild-to-moderate right neural foraminal narrowing. Effacement of the lateral recesses at this level likely affects the descending L5 nerve roots. 2. Multilevel facet arthropathy, which is moderate at L3-L4 and severe at L4-L5, which can also be a source of back pain.  TODAY'S TREATMENT:                                                                                                                              Pt seen for aquatic therapy today.  Treatment took place in water 3.5-4.75 ft in depth at the [facility] pool. Temp of water was 91.  Pt entered/exited the pool via stairs independently with bilat rail.  * unsupported: walking forward / backwards with reciprocal arm swing , side stepping with shoulder abd/add * high knee marching with alternating row with rainbow hand floats * TrA set with blue noodle (with hole) pull downs to leg (in standard stance x 10, staggered stance 2 x 5)  *Standing hip abd with Tra, Standing hip flexion with TrA 2x10 each bilat  with UE support on  wall Standing HS stretch at stairs 3x20 sec bilat * staggered stance with bilat horiz abdct/ addct with rainbow hand floats 2x10  * straddling noodle and holding hand floats: stationary cycling and cycling 1 lap   Pt requires the buoyancy and hydrostatic pressure of water for support, and to offload joints by unweighting joint load by at least 50 % in navel deep water and by at least 75-80% in chest to neck deep water.  Viscosity of the water is needed for resistance of strengthening. Water current perturbations provides challenge to standing balance requiring increased core activation.   PATIENT EDUCATION:  Education details: aquatic therapy properties and benefits, exercise form  Person educated: Patient Education method: Explanation, demonstration Education comprehension: verbalized understanding, returned demonstration  HOME EXERCISE PROGRAM: Pt has a HEP.  Will give further HEP at a later date.   ASSESSMENT:  CLINICAL IMPRESSION: Pt is confident in aquatic setting and able to take direction from therapist on deck.  Pt performed aquatic exercises well with cuing and instruction in correct form.  Pt required cuing for reciprocal arm swing and posture with ambulating fwd/bwd in the pool.  Pt responded well to Rx reporting improved pain from 8/10 before Rx to 4/10 after Rx and stating she was not having the pinching pain anymore.  Pt should benefit from continued skilled PT services including aquatic therapy to address impairments and goals and improve overall function.     OBJECTIVE IMPAIRMENTS: decreased activity tolerance, decreased mobility, difficulty walking, decreased ROM, decreased strength, hypomobility, increased fascial restrictions, increased muscle spasms, impaired flexibility, and pain.   ACTIVITY LIMITATIONS: bending, transfers, bed mobility, and locomotion level  PARTICIPATION LIMITATIONS: occupation and taking care of granchildren  PERSONAL FACTORS: Time since onset of  injury/illness/exacerbation and 3+ comorbidities: Arthritis in bilat knees, Thoracic pain, obesity, cervical fusion, anxiety, and depression  are also affecting patient's functional outcome.   REHAB POTENTIAL: Good  CLINICAL DECISION MAKING: Evolving/moderate complexity  EVALUATION COMPLEXITY: Moderate   GOALS:  SHORT TERM GOALS:  Pt will tolerate aquatic therapy without adverse effects for improved core strength, pain, function, and tolerance to activity.  Baseline: Goal status: INITIAL Target date:  06/04/2023  2.  Pt will demonstrate improved soft tissue tightness in L sided lumbar paraspinals and reduced tenderness in glute and piriformis for improved pain, tension, mobility, and ease with daily activities.  Baseline:  Goal status: INITIAL Target date:  06/18/2023   3.  Pt will report at least a 25% improvement in pain and sx's overall.  Baseline:  Goal status: INITIAL Target date:  06/11/2023  4.  Pt will progress with aquatic exercises without adverse effects for improved core strength, mobility and improved tolerance with daily activities and work activities Baseline:  Goal status: INITIAL    LONG TERM GOALS: Target date: 08/06/2023  Pt will be able to ambulate her normal community distance and perform her normal work activities without her L LE wanting to give way.  Baseline:  Goal status: INITIAL  2.  Pt will report at least a 70% improvement in pain with work activities.  Baseline:  Goal status: INITIAL  3.  Pt will demo proper body mechanics with hip hinge/squatting to lift instead of bending to reduce lumbar stress and strain with functional activities at home and at work.  Baseline:  Goal status: INITIAL  4.  Pt will be independent with aquatic and land based HEP for improved core strength, pain, tolerance to activity, and function.  Baseline:  Goal status: INITIAL  5.  Pt will be able to perform her ADLs and IADLs without significant pain and difficulty.   Baseline:  Goal status: INITIAL    PLAN:  PT FREQUENCY: 2x/week  PT DURATION: 12 weeks  PLANNED INTERVENTIONS: Therapeutic exercises, Therapeutic activity, Neuromuscular re-education, Balance training, Gait training, Patient/Family education, Self Care, Joint mobilization, Stair training, Aquatic Therapy, Dry Needling, Electrical stimulation, Spinal mobilization, Cryotherapy, Moist heat, Taping, Ultrasound, Manual therapy, and Re-evaluation.  PLAN FOR NEXT SESSION: Cont with aquatic therapy.  Core strengthening and STW in land therapy.  PT will be interrupted due to Pt planning on having  bariatric surgery and breast reduction surgery.  PT instructed she will but put on hold during those time and will need to be cleared by MD to return to PT.  Pt agreeable.  Audie Clear III PT, DPT 05/28/23 6:17 PM

## 2023-05-27 ENCOUNTER — Ambulatory Visit (HOSPITAL_BASED_OUTPATIENT_CLINIC_OR_DEPARTMENT_OTHER): Payer: Commercial Managed Care - HMO | Admitting: Physical Therapy

## 2023-05-27 DIAGNOSIS — M5459 Other low back pain: Secondary | ICD-10-CM | POA: Diagnosis not present

## 2023-05-27 DIAGNOSIS — M62838 Other muscle spasm: Secondary | ICD-10-CM

## 2023-05-28 ENCOUNTER — Encounter (HOSPITAL_BASED_OUTPATIENT_CLINIC_OR_DEPARTMENT_OTHER): Payer: Self-pay | Admitting: Physical Therapy

## 2023-05-29 ENCOUNTER — Ambulatory Visit (HOSPITAL_BASED_OUTPATIENT_CLINIC_OR_DEPARTMENT_OTHER): Payer: Commercial Managed Care - HMO | Admitting: Physical Therapy

## 2023-05-29 ENCOUNTER — Encounter (HOSPITAL_BASED_OUTPATIENT_CLINIC_OR_DEPARTMENT_OTHER): Payer: Self-pay | Admitting: Physical Therapy

## 2023-05-29 DIAGNOSIS — M62838 Other muscle spasm: Secondary | ICD-10-CM

## 2023-05-29 DIAGNOSIS — M5459 Other low back pain: Secondary | ICD-10-CM | POA: Diagnosis not present

## 2023-05-29 NOTE — Therapy (Signed)
OUTPATIENT PHYSICAL THERAPY THORACOLUMBAR TREATMENT   Patient Name: Kim Watson MRN: [id number] DOB:02-23-1971, 52 y.o., female Today's Date: 05/29/2023  END OF SESSION:  PT End of Session - 05/29/23 1107     Visit Number 4    Number of Visits 14    Date for PT Re-Evaluation 08/06/23    Authorization Type [employer]    PT Start Time 1106    PT Stop Time 1144    PT Time Calculation (min) 38 min    Activity Tolerance Patient tolerated treatment well;No increased pain    Behavior During Therapy WFL for tasks assessed/performed              Past Medical History:  Diagnosis Date   Anxiety    Arthritis    Depression    Gallbladder problem    GERD (gastroesophageal reflux disease)    High blood pressure    Hypertension    Migraine    Obesity    Obstructive sleep apnea    2016 was cleared from having to use CPAP at night. Gastric bypass surgery 2015.   Vitamin D deficiency    Past Surgical History:  Procedure Laterality Date   ANTERIOR CERVICAL DECOMP/DISCECTOMY FUSION N/A 10/02/2022   Procedure: ANTERIOR CERVICAL DISCECTOMY AND FUSION CERVICAL FOUR TO SEVEN;  Surgeon: Venita Lick, MD;  Location: [facility] OR;  Service: Orthopedics;  Laterality: N/A;  4hrs 3 C-Bed   CHOLECYSTECTOMY     FOOT SURGERY Left 07/26/2022   5th metatarsal   SLEEVE GASTROPLASTY     TUBAL LIGATION     Patient Active Problem List   Diagnosis Date Noted   Bronchitis 05/15/2023   Back pain 04/14/2023   Symptomatic mammary hypertrophy 04/14/2023   Bilateral carpal tunnel syndrome 12/12/2022   Tendonitis 12/12/2022   Cervical myelopathy (HCC) 10/02/2022   Preop examination 09/16/2022   COVID-19 08/20/2022   Acute nonintractable headache 08/20/2022   Pain in right shoulder 06/27/2022   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/07/2022   Primary hypertension 05/07/2022   Neck pain 05/07/2022   Acute left-sided low back pain without sciatica 02/24/2022   Bursitis of left shoulder 01/10/2022   Dry mouth 01/10/2022    History of bariatric surgery 01/10/2022   Elevated blood pressure reading 01/10/2022   Anxiety and depression 01/10/2022   Gastroesophageal reflux disease 01/10/2022   Chronic migraine without aura without status migrainosus, not intractable 08/31/2018   Prediabetes 07/13/2017   At risk for diabetes mellitus 07/02/2017   Insulin resistance 07/02/2017   Vitamin D deficiency 07/02/2017   Other hyperlipidemia 07/02/2017   Morbid obesity (HCC) 06/18/2017   Chronic pain of both shoulders 11/02/2014   Arthritis of both knees 10/04/2014     REFERRING PROVIDER: Venita Lick, MD  REFERRING DIAG: M54.51 (ICD-10-CM) - Vertebrogenic low back pain  Rationale for Evaluation and Treatment: Rehabilitation  THERAPY DIAG:  Other low back pain  Other muscle spasm  ONSET DATE: Chronic / Exacerbation March 2023  SUBJECTIVE:  SUBJECTIVE STATEMENT: Pt states she felt fine after prior Rx.  She reports having a little soreness after prior Rx though no pain.  Pt feels pretty good today.  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    PERTINENT HISTORY:  MRI findings including grade 1 anterolisthesis of L4 on L5 Arthritis in bilat knees, Thoracic pain, obesity, migraines, anxiety, and depression HTN, R thumb CMC OA, Bilat carpal tunnel syndrome PSHx:  Anterior cervical fusion (C4-C7) in 10/2022, Bariatric surgery, L foot surgery 5th MT in 2023     PAIN:  Are you having pain? Yes Location:  R sided lumbar pain  NPRS:  2/10 Type:  Constant pain; feels a pinch Pt does have some N/T in L thigh  PRECAUTIONS: Other: Grade 1 anterolisthesis of L4-L5, C4-C7 fusion  WEIGHT BEARING RESTRICTIONS: No  FALLS:  Has patient fallen in last 6 months? No   OCCUPATION: Conservation officer, nature and also  preparing food at [employer]  PLOF: IndependentPt has chronic pain though was able to perform her daily activities, daily mobility, and work activities with less pain.   PATIENT GOALS: to be able to active with grandkids, to be pain free   OBJECTIVE:   DIAGNOSTIC FINDINGS:  Lumbar MRI in 2023: IMPRESSION: 1. Slight progression of previously noted grade 1 anterolisthesis of L4 on L5, with new right paracentral disc extrusion with 6 mm cranial migration and new mild-to-moderate spinal canal stenosis. Unchanged mild left and mild-to-moderate right neural foraminal narrowing. Effacement of the lateral recesses at this level likely affects the descending L5 nerve roots. 2. Multilevel facet arthropathy, which is moderate at L3-L4 and severe at L4-L5, which can also be a source of back pain.   TODAY'S TREATMENT:                                                                                                                              Pt seen for aquatic therapy today.  Treatment took place in water 3.5-4.75 ft in depth at the [facility] pool. Temp of water was 91.  Pt entered/exited the pool via stairs independently with bilat rail.  * unsupported: walking forward / backwards with reciprocal arm swing , side stepping with shoulder abd/add * high knee marching with alternating row with rainbow hand floats * TrA set with blue noodle (with hole) pull downs to leg (in standard stance x 15, staggered stance 2 x 5 each)  *Standing hip abd with Tra while holding on yellow noodle 2x10 *Standing hip flexion with TrA 2x10 with UE support on wall *Standing HS stretch at stairs 3x20 sec bilat * staggered stance with bilat horiz abdct/ addct with rainbow hand floats 2x10  *Ambulating while holding yellow hand floats by side to improve core strength x 2 laps fwd and 2 laps bwd * straddling noodle and holding hand floats: stationary cycling and cycling 1 lap  *seated piriformis stretch 3x20  sec  bilat   Pt requires the buoyancy and hydrostatic pressure of water for support, and to offload joints by unweighting joint load by at least 50 % in navel deep water and by at least 75-80% in chest to neck deep water.  Viscosity of the water is needed for resistance of strengthening. Water current perturbations provides challenge to standing balance requiring increased core activation.   PATIENT EDUCATION:  Education details: aquatic therapy properties and benefits, exercise form  Person educated: Patient Education method: Explanation, demonstration Education comprehension: verbalized understanding, returned demonstration  HOME EXERCISE PROGRAM: Pt has a HEP.  Will give further HEP at a later date.   ASSESSMENT:  CLINICAL IMPRESSION: Pt presents to Rx with less pain today.  Pt was working the window instead of preparing food this AM at work.  Pt performed aquatic exercises well with cuing and instruction in correct form.  Pt was challenged with balance and form with hip abd while holding onto yellow noodle.  Pt is improving with control and form with cycling on noodle.  Pt seems to be tolerating aquatic therapy well.  Pt responded well to Rx reporting no increased pain after Rx. Pt should benefit from continued skilled PT services including aquatic therapy to address impairments and goals and improve overall function.    OBJECTIVE IMPAIRMENTS: decreased activity tolerance, decreased mobility, difficulty walking, decreased ROM, decreased strength, hypomobility, increased fascial restrictions, increased muscle spasms, impaired flexibility, and pain.   ACTIVITY LIMITATIONS: bending, transfers, bed mobility, and locomotion level  PARTICIPATION LIMITATIONS: occupation and taking care of granchildren  PERSONAL FACTORS: Time since onset of injury/illness/exacerbation and 3+ comorbidities: Arthritis in bilat knees, Thoracic pain, obesity, cervical fusion, anxiety, and depression  are also  affecting patient's functional outcome.   REHAB POTENTIAL: Good  CLINICAL DECISION MAKING: Evolving/moderate complexity  EVALUATION COMPLEXITY: Moderate   GOALS:  SHORT TERM GOALS:  Pt will tolerate aquatic therapy without adverse effects for improved core strength, pain, function, and tolerance to activity.  Baseline: Goal status: INITIAL Target date:  06/04/2023  2.  Pt will demonstrate improved soft tissue tightness in L sided lumbar paraspinals and reduced tenderness in glute and piriformis for improved pain, tension, mobility, and ease with daily activities.  Baseline:  Goal status: INITIAL Target date:  06/18/2023   3.  Pt will report at least a 25% improvement in pain and sx's overall.  Baseline:  Goal status: INITIAL Target date:  06/11/2023  4.  Pt will progress with aquatic exercises without adverse effects for improved core strength, mobility and improved tolerance with daily activities and work activities Baseline:  Goal status: INITIAL    LONG TERM GOALS: Target date: 08/06/2023  Pt will be able to ambulate her normal community distance and perform her normal work activities without her L LE wanting to give way.  Baseline:  Goal status: INITIAL  2.  Pt will report at least a 70% improvement in pain with work activities.  Baseline:  Goal status: INITIAL  3.  Pt will demo proper body mechanics with hip hinge/squatting to lift instead of bending to reduce lumbar stress and strain with functional activities at home and at work.  Baseline:  Goal status: INITIAL  4.  Pt will be independent with aquatic and land based HEP for improved core strength, pain, tolerance to activity, and function.   Baseline:  Goal status: INITIAL  5.  Pt will be able to perform her ADLs and IADLs without significant pain and difficulty.  Baseline:  Goal status:  INITIAL    PLAN:  PT FREQUENCY: 2x/week  PT DURATION: 12 weeks  PLANNED INTERVENTIONS: Therapeutic exercises,  Therapeutic activity, Neuromuscular re-education, Balance training, Gait training, Patient/Family education, Self Care, Joint mobilization, Stair training, Aquatic Therapy, Dry Needling, Electrical stimulation, Spinal mobilization, Cryotherapy, Moist heat, Taping, Ultrasound, Manual therapy, and Re-evaluation.  PLAN FOR NEXT SESSION: Cont with aquatic therapy.  Core strengthening and STW in land therapy.  PT will be interrupted due to Pt planning on having  bariatric surgery and breast reduction surgery.  PT instructed she will but put on hold during those time and will need to be cleared by MD to return to PT.  Pt agreeable.  Audie Clear III PT, DPT 05/29/23 6:13 PM

## 2023-06-01 ENCOUNTER — Ambulatory Visit (HOSPITAL_BASED_OUTPATIENT_CLINIC_OR_DEPARTMENT_OTHER): Payer: Commercial Managed Care - HMO | Attending: Orthopedic Surgery | Admitting: Physical Therapy

## 2023-06-01 DIAGNOSIS — R262 Difficulty in walking, not elsewhere classified: Secondary | ICD-10-CM | POA: Insufficient documentation

## 2023-06-01 DIAGNOSIS — M5459 Other low back pain: Secondary | ICD-10-CM | POA: Diagnosis present

## 2023-06-01 DIAGNOSIS — M62838 Other muscle spasm: Secondary | ICD-10-CM | POA: Diagnosis present

## 2023-06-01 DIAGNOSIS — M25552 Pain in left hip: Secondary | ICD-10-CM | POA: Insufficient documentation

## 2023-06-01 NOTE — Therapy (Signed)
OUTPATIENT PHYSICAL THERAPY THORACOLUMBAR TREATMENT   Patient Name: Kim Watson MRN: [id number] DOB:06-07-1971, 52 y.o., female Today's Date: 06/02/2023  END OF SESSION:  PT End of Session - 06/01/23 1545     Visit Number 5    Number of Visits 14    Date for PT Re-Evaluation 08/06/23    Authorization Type [employer]    PT Start Time 1541    PT Stop Time 1622    PT Time Calculation (min) 41 min    Activity Tolerance Patient tolerated treatment well;No increased pain    Behavior During Therapy WFL for tasks assessed/performed              Past Medical History:  Diagnosis Date   Anxiety    Arthritis    Depression    Gallbladder problem    GERD (gastroesophageal reflux disease)    High blood pressure    Hypertension    Migraine    Obesity    Obstructive sleep apnea    2016 was cleared from having to use CPAP at night. Gastric bypass surgery 2015.   Vitamin D deficiency    Past Surgical History:  Procedure Laterality Date   ANTERIOR CERVICAL DECOMP/DISCECTOMY FUSION N/A 10/02/2022   Procedure: ANTERIOR CERVICAL DISCECTOMY AND FUSION CERVICAL FOUR TO SEVEN;  Surgeon: Venita Lick, MD;  Location: [facility] OR;  Service: Orthopedics;  Laterality: N/A;  4hrs 3 C-Bed   CHOLECYSTECTOMY     FOOT SURGERY Left 07/26/2022   5th metatarsal   SLEEVE GASTROPLASTY     TUBAL LIGATION     Patient Active Problem List   Diagnosis Date Noted   Bronchitis 05/15/2023   Back pain 04/14/2023   Symptomatic mammary hypertrophy 04/14/2023   Bilateral carpal tunnel syndrome 12/12/2022   Tendonitis 12/12/2022   Cervical myelopathy (HCC) 10/02/2022   Preop examination 09/16/2022   COVID-19 08/20/2022   Acute nonintractable headache 08/20/2022   Pain in right shoulder 06/27/2022   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/07/2022   Primary hypertension 05/07/2022   Neck pain 05/07/2022   Acute left-sided low back pain without sciatica 02/24/2022   Bursitis of left shoulder 01/10/2022   Dry mouth 01/10/2022    History of bariatric surgery 01/10/2022   Elevated blood pressure reading 01/10/2022   Anxiety and depression 01/10/2022   Gastroesophageal reflux disease 01/10/2022   Chronic migraine without aura without status migrainosus, not intractable 08/31/2018   Prediabetes 07/13/2017   At risk for diabetes mellitus 07/02/2017   Insulin resistance 07/02/2017   Vitamin D deficiency 07/02/2017   Other hyperlipidemia 07/02/2017   Morbid obesity (HCC) 06/18/2017   Chronic pain of both shoulders 11/02/2014   Arthritis of both knees 10/04/2014     REFERRING PROVIDER: Venita Lick, MD  REFERRING DIAG: M54.51 (ICD-10-CM) - Vertebrogenic low back pain  Rationale for Evaluation and Treatment: Rehabilitation  THERAPY DIAG:  Other low back pain  Other muscle spasm  ONSET DATE: Chronic / Exacerbation March 2023  SUBJECTIVE:  SUBJECTIVE STATEMENT: Pt denies any adverse effects after prior Rx, just a little soreness.  Pt states work was a little slow today so she did some cleaning.  Pt performs bending when she is cleaning at work which bothers her back.   PERTINENT HISTORY:  MRI findings including grade 1 anterolisthesis of L4 on L5 Arthritis in bilat knees, Thoracic pain, obesity, migraines, anxiety, and depression HTN, R thumb CMC OA, Bilat carpal tunnel syndrome PSHx:  Anterior cervical fusion (C4-C7) in 10/2022, Bariatric surgery, L foot surgery 5th MT in 2023     PAIN:  Are you having pain? Yes Location:  R sided lumbar / L sided lumbar and L glute  NPRS:  5/10 / 4/10 Type:  Constant pain; feels a pinch Pt does have some N/T in L thigh  PRECAUTIONS: Other: Grade 1 anterolisthesis of L4-L5, C4-C7 fusion  WEIGHT BEARING RESTRICTIONS: No  FALLS:  Has patient fallen in last 6 months?  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   PATIENT GOALS: to be able to active with grandkids, to be pain free   OBJECTIVE:   DIAGNOSTIC FINDINGS:  Lumbar MRI in 2023: IMPRESSION: 1. Slight progression of previously noted grade 1 anterolisthesis of L4 on L5, with new right paracentral disc extrusion with 6 mm cranial migration and new mild-to-moderate spinal canal stenosis. Unchanged mild left and mild-to-moderate right neural foraminal narrowing. Effacement of the lateral recesses at this level likely affects the descending L5 nerve roots. 2. Multilevel facet arthropathy, which is moderate at L3-L4 and severe at L4-L5, which can also be a source of back pain.   TODAY'S TREATMENT:                                                                                                                              Pt seen for aquatic therapy today.  Treatment took place in water 3.5-4.75 ft in depth at the [facility] pool. Temp of water was 90.  Pt entered/exited the pool via stairs independently with bilat rail.  * unsupported: walking forward / backwards with reciprocal arm swing , side stepping with shoulder abd/add * high knee marching with alternating row with rainbow hand floats * TrA set with blue noodle (with hole) pull downs to leg (in standard stance x 15, staggered stance 2 x 5 each)  *Standing hip abd with Tra while holding on yellow noodle 2x10 *Standing hip flexion with TrA 2x10 with UE support on wall *Standing HS stretch at stairs 3x20 sec bilat * staggered stance with bilat horiz abdct/ addct with rainbow hand floats 2x10  *Kickboard push/pull with TrA 2x10 *Ambulating while holding yellow hand floats by side to improve core strength x 2 laps fwd and 2 laps bwd *seated piriformis stretch 2x20 sec  bilat * straddling noodle and  holding yellow hand floats: cycling 2 laps, scissors 2x15   Pt requires the buoyancy and hydrostatic pressure of water for support, and to offload joints by unweighting joint load by at least 50 % in navel deep water and by at least 75-80% in chest to neck deep water.  Viscosity of the water is needed for resistance of strengthening. Water current perturbations provides challenge to standing balance requiring increased core activation.   PATIENT EDUCATION:  Education details: aquatic therapy properties and benefits, exercise form  Person educated: Patient Education method: Explanation, demonstration Education comprehension: verbalized understanding, returned demonstration  HOME EXERCISE PROGRAM: Pt has a HEP.  Will give further HEP at a later date.   ASSESSMENT:  CLINICAL IMPRESSION: Pt presents to Rx having not only R sided lumbar pain but L sided lumbar and glute pain also.  Pt was performing bending while cleaning at work today.  Pt performed aquatic exercises well with cuing and instruction in correct form.  PT is progressing aquatic exercises and pt has good tolerance.  Pt was challenged with balance and form with hip abd while holding onto yellow noodle and demonstrates improved form compared to prior Rx.  Pt is improving with control and form with cycling on noodle as evidenced by performance and increasing the distance.  Pt responded well to Rx reporting improved pain to 2/10 after Rx.  Pt should benefit from continued skilled PT services including aquatic therapy to address impairments and goals and improve overall function.     OBJECTIVE IMPAIRMENTS: decreased activity tolerance, decreased mobility, difficulty walking, decreased ROM, decreased strength, hypomobility, increased fascial restrictions, increased muscle spasms, impaired flexibility, and pain.   ACTIVITY LIMITATIONS: bending, transfers, bed mobility, and locomotion level  PARTICIPATION  LIMITATIONS: occupation and taking care of granchildren  PERSONAL FACTORS: Time since onset of injury/illness/exacerbation and 3+ comorbidities: Arthritis in bilat knees, Thoracic pain, obesity, cervical fusion, anxiety, and depression  are also affecting patient's functional outcome.   REHAB POTENTIAL: Good  CLINICAL DECISION MAKING: Evolving/moderate complexity  EVALUATION COMPLEXITY: Moderate   GOALS:  SHORT TERM GOALS:  Pt will tolerate aquatic therapy without adverse effects for improved core strength, pain, function, and tolerance to activity.  Baseline: Goal status: INITIAL Target date:  06/04/2023  2.  Pt will demonstrate improved soft tissue tightness in L sided lumbar paraspinals and reduced tenderness in glute and piriformis for improved pain, tension, mobility, and ease with daily activities.  Baseline:  Goal status: INITIAL Target date:  06/18/2023   3.  Pt will report at least a 25% improvement in pain and sx's overall.  Baseline:  Goal status: INITIAL Target date:  06/11/2023  4.  Pt will progress with aquatic exercises without adverse effects for improved core strength, mobility and improved tolerance with daily activities and work activities Baseline:  Goal status: INITIAL    LONG TERM GOALS: Target date: 08/06/2023  Pt will be able to ambulate her normal community distance and perform her normal work activities without her L LE wanting to give way.  Baseline:  Goal status: INITIAL  2.  Pt will report at least a 70% improvement in pain with work activities.  Baseline:  Goal status: INITIAL  3.  Pt will demo proper body mechanics with hip hinge/squatting to lift instead of bending to reduce lumbar stress and strain with functional activities at home and at work.  Baseline:  Goal status: INITIAL  4.  Pt will be independent with aquatic and land based HEP for improved core strength,  pain, tolerance to activity, and function.   Baseline:  Goal status:  INITIAL  5.  Pt will be able to perform her ADLs and IADLs without significant pain and difficulty.  Baseline:  Goal status: INITIAL    PLAN:  PT FREQUENCY: 2x/week  PT DURATION: 12 weeks  PLANNED INTERVENTIONS: Therapeutic exercises, Therapeutic activity, Neuromuscular re-education, Balance training, Gait training, Patient/Family education, Self Care, Joint mobilization, Stair training, Aquatic Therapy, Dry Needling, Electrical stimulation, Spinal mobilization, Cryotherapy, Moist heat, Taping, Ultrasound, Manual therapy, and Re-evaluation.  PLAN FOR NEXT SESSION: Cont with aquatic therapy.  Core strengthening and STW in land therapy.  PT will be interrupted due to Pt planning on having  bariatric surgery and breast reduction surgery.  PT instructed she will but put on hold during those time and will need to be cleared by MD to return to PT.  Pt agreeable.  Audie Clear III PT, DPT 06/02/23 11:03 PM

## 2023-06-02 ENCOUNTER — Encounter (HOSPITAL_BASED_OUTPATIENT_CLINIC_OR_DEPARTMENT_OTHER): Payer: Self-pay | Admitting: Physical Therapy

## 2023-06-03 ENCOUNTER — Encounter (HOSPITAL_BASED_OUTPATIENT_CLINIC_OR_DEPARTMENT_OTHER): Payer: Self-pay | Admitting: Physical Therapy

## 2023-06-03 ENCOUNTER — Ambulatory Visit (HOSPITAL_BASED_OUTPATIENT_CLINIC_OR_DEPARTMENT_OTHER): Payer: Commercial Managed Care - HMO | Admitting: Physical Therapy

## 2023-06-03 DIAGNOSIS — M62838 Other muscle spasm: Secondary | ICD-10-CM

## 2023-06-03 DIAGNOSIS — M5459 Other low back pain: Secondary | ICD-10-CM | POA: Diagnosis not present

## 2023-06-03 NOTE — Therapy (Signed)
OUTPATIENT PHYSICAL THERAPY THORACOLUMBAR TREATMENT   Patient Name: Kim Watson MRN: [id number] DOB:21-Jul-1971, 52 y.o., female Today's Date: 06/04/2023  END OF SESSION:  PT End of Session - 06/03/23 1231     Visit Number 6    Number of Visits 14    Date for PT Re-Evaluation 08/06/23    Authorization Type [employer]    PT Start Time 1155    PT Stop Time 1240    PT Time Calculation (min) 45 min    Activity Tolerance Patient tolerated treatment well;No increased pain    Behavior During Therapy WFL for tasks assessed/performed               Past Medical History:  Diagnosis Date   Anxiety    Arthritis    Depression    Gallbladder problem    GERD (gastroesophageal reflux disease)    High blood pressure    Hypertension    Migraine    Obesity    Obstructive sleep apnea    2016 was cleared from having to use CPAP at night. Gastric bypass surgery 2015.   Vitamin D deficiency    Past Surgical History:  Procedure Laterality Date   ANTERIOR CERVICAL DECOMP/DISCECTOMY FUSION N/A 10/02/2022   Procedure: ANTERIOR CERVICAL DISCECTOMY AND FUSION CERVICAL FOUR TO SEVEN;  Surgeon: Venita Lick, MD;  Location: [facility] OR;  Service: Orthopedics;  Laterality: N/A;  4hrs 3 C-Bed   CHOLECYSTECTOMY     FOOT SURGERY Left 07/26/2022   5th metatarsal   SLEEVE GASTROPLASTY     TUBAL LIGATION     Patient Active Problem List   Diagnosis Date Noted   Bronchitis 05/15/2023   Back pain 04/14/2023   Symptomatic mammary hypertrophy 04/14/2023   Bilateral carpal tunnel syndrome 12/12/2022   Tendonitis 12/12/2022   Cervical myelopathy (HCC) 10/02/2022   Preop examination 09/16/2022   COVID-19 08/20/2022   Acute nonintractable headache 08/20/2022   Pain in right shoulder 06/27/2022   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/07/2022   Primary hypertension 05/07/2022   Neck pain 05/07/2022   Acute left-sided low back pain without sciatica 02/24/2022   Bursitis of left shoulder 01/10/2022   Dry mouth 01/10/2022    History of bariatric surgery 01/10/2022   Elevated blood pressure reading 01/10/2022   Anxiety and depression 01/10/2022   Gastroesophageal reflux disease 01/10/2022   Chronic migraine without aura without status migrainosus, not intractable 08/31/2018   Prediabetes 07/13/2017   At risk for diabetes mellitus 07/02/2017   Insulin resistance 07/02/2017   Vitamin D deficiency 07/02/2017   Other hyperlipidemia 07/02/2017   Morbid obesity (HCC) 06/18/2017   Chronic pain of both shoulders 11/02/2014   Arthritis of both knees 10/04/2014     REFERRING PROVIDER: Venita Lick, MD  REFERRING DIAG: M54.51 (ICD-10-CM) - Vertebrogenic low back pain  Rationale for Evaluation and Treatment: Rehabilitation  THERAPY DIAG:  Other low back pain  Other muscle spasm  ONSET DATE: Chronic / Exacerbation March 2023  SUBJECTIVE:  SUBJECTIVE STATEMENT: Pt denies any adverse effects after prior Rx.  Pt states her soreness from the exercises is less.  Pt has noticed improved mid back/thoracic pain.  Pt is more aware of her posture.  Pt typically has increased pain on Wednesdays due to work activities.  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  Pt reports having pain with work though less than normal today.     PERTINENT HISTORY:  MRI findings including grade 1 anterolisthesis of L4 on L5 Arthritis in bilat knees, Thoracic pain, obesity, migraines, anxiety, and depression HTN, R thumb CMC OA, Bilat carpal tunnel syndrome PSHx:  Anterior cervical fusion (C4-C7) in 10/2022, Bariatric surgery, L foot surgery 5th MT in 2023     PAIN:  Are you having pain? Yes Location:  R sided lumbar / L glute  NPRS:  7/10 / 4/10 Type:  Constant pain; feels a pinch Pt does have some N/T in L  thigh  PRECAUTIONS: Other: Grade 1 anterolisthesis of L4-L5, C4-C7 fusion  WEIGHT BEARING RESTRICTIONS: No  FALLS:  Has patient fallen in last 6 months? 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   PATIENT GOALS: to be able to active with grandkids, to be pain free   OBJECTIVE:   DIAGNOSTIC FINDINGS:  Lumbar MRI in 2023: IMPRESSION: 1. Slight progression of previously noted grade 1 anterolisthesis of L4 on L5, with new right paracentral disc extrusion with 6 mm cranial migration and new mild-to-moderate spinal canal stenosis. Unchanged mild left and mild-to-moderate right neural foraminal narrowing. Effacement of the lateral recesses at this level likely affects the descending L5 nerve roots. 2. Multilevel facet arthropathy, which is moderate at L3-L4 and severe at L4-L5, which can also be a source of back pain.   TODAY'S TREATMENT:                                                                                                                              Pt seen for aquatic therapy today.  Treatment took place in water 3.5-4.75 ft in depth at the [facility] pool. Temp of water was 90.  Pt entered/exited the pool via stairs independently with bilat rail.  * unsupported: walking forward / backwards with reciprocal arm swing , side stepping with shoulder abd/add * high knee marching with alternating row with rainbow hand floats * TrA set with blue square noodle pull downs to leg (staggered stance 2 x 10 each)  *Standing hip abd with Tra while holding on yellow noodle 2x10 *Standing hip flexion with TrA 2x10 with UE support on wall *Standing HS stretch at stairs 3x30 sec bilat * staggered stance with bilat horiz abdct/ addct with rainbow hand floats 2x10-12  *Kickboard push/pull with TrA 2x15 * high knee  marching with alternating row  with rainbow hand floats  x 2 laps *seated piriformis stretch 2x20 sec bilat * straddling noodle and holding yellow hand floats: cycling 2 laps, scissors 2x15   Pt requires the buoyancy and hydrostatic pressure of water for support, and to offload joints by unweighting joint load by at least 50 % in navel deep water and by at least 75-80% in chest to neck deep water.  Viscosity of the water is needed for resistance of strengthening. Water current perturbations provides challenge to standing balance requiring increased core activation.   PATIENT EDUCATION:  Education details: aquatic therapy properties and benefits, exercise form  Person educated: Patient Education method: Explanation, demonstration, verbal cues Education comprehension: verbalized understanding, returned demonstration, verbal cues required  HOME EXERCISE PROGRAM: Pt has a HEP.  Will give further HEP at a later date.   ASSESSMENT:  CLINICAL IMPRESSION: Pt is improving with pain and sx's.  She reports having improved thoracic pain and had less pain today at work.  Pt performed aquatic exercises well with cuing and instruction in correct form.  Pt has good tolerance with aquatic exercises.  Pt is improving with control and form with cycling on noodle as evidenced by performance.  Pt responded well to Rx reporting improved pain to 3/10 after Rx.  Pt should benefit from continued skilled PT services including aquatic therapy to address impairments and goals and improve overall function.     OBJECTIVE IMPAIRMENTS: decreased activity tolerance, decreased mobility, difficulty walking, decreased ROM, decreased strength, hypomobility, increased fascial restrictions, increased muscle spasms, impaired flexibility, and pain.   ACTIVITY LIMITATIONS: bending, transfers, bed mobility, and locomotion level  PARTICIPATION LIMITATIONS: occupation and taking care of granchildren  PERSONAL FACTORS: Time since  onset of injury/illness/exacerbation and 3+ comorbidities: Arthritis in bilat knees, Thoracic pain, obesity, cervical fusion, anxiety, and depression  are also affecting patient's functional outcome.   REHAB POTENTIAL: Good  CLINICAL DECISION MAKING: Evolving/moderate complexity  EVALUATION COMPLEXITY: Moderate   GOALS:  SHORT TERM GOALS:  Pt will tolerate aquatic therapy without adverse effects for improved core strength, pain, function, and tolerance to activity.  Baseline: Goal status: INITIAL Target date:  06/04/2023  2.  Pt will demonstrate improved soft tissue tightness in L sided lumbar paraspinals and reduced tenderness in glute and piriformis for improved pain, tension, mobility, and ease with daily activities.  Baseline:  Goal status: INITIAL Target date:  06/18/2023   3.  Pt will report at least a 25% improvement in pain and sx's overall.  Baseline:  Goal status: INITIAL Target date:  06/11/2023  4.  Pt will progress with aquatic exercises without adverse effects for improved core strength, mobility and improved tolerance with daily activities and work activities Baseline:  Goal status: INITIAL    LONG TERM GOALS: Target date: 08/06/2023  Pt will be able to ambulate her normal community distance and perform her normal work activities without her L LE wanting to give way.  Baseline:  Goal status: INITIAL  2.  Pt will report at least a 70% improvement in pain with work activities.  Baseline:  Goal status: INITIAL  3.  Pt will demo proper body mechanics with hip hinge/squatting to lift instead of bending to reduce lumbar stress and strain with functional activities at home and at work.  Baseline:  Goal status: INITIAL  4.  Pt will be independent with aquatic and land based HEP for improved core strength, pain, tolerance to activity, and function.   Baseline:  Goal status: INITIAL  5.  Pt will be able to perform her ADLs and IADLs without significant pain and  difficulty.  Baseline:  Goal status: INITIAL    PLAN:  PT FREQUENCY: 2x/week  PT DURATION: 12 weeks  PLANNED INTERVENTIONS: Therapeutic exercises, Therapeutic activity, Neuromuscular re-education, Balance training, Gait training, Patient/Family education, Self Care, Joint mobilization, Stair training, Aquatic Therapy, Dry Needling, Electrical stimulation, Spinal mobilization, Cryotherapy, Moist heat, Taping, Ultrasound, Manual therapy, and Re-evaluation.  PLAN FOR NEXT SESSION: Cont with aquatic therapy.  Core strengthening and STW in land therapy.  PT will be interrupted due to Pt planning on having  bariatric surgery and breast reduction surgery.  PT will be put on hold at that time and will need to be cleared by MD to return to PT.   Audie Clear III PT, DPT 06/04/23 8:48 AM

## 2023-06-04 ENCOUNTER — Other Ambulatory Visit: Payer: Self-pay | Admitting: Nurse Practitioner

## 2023-06-05 NOTE — Telephone Encounter (Signed)
Called patient to see if refill is needed

## 2023-06-06 ENCOUNTER — Other Ambulatory Visit: Payer: Self-pay | Admitting: Nurse Practitioner

## 2023-06-06 ENCOUNTER — Encounter: Payer: Self-pay | Admitting: Nurse Practitioner

## 2023-06-06 DIAGNOSIS — L920 Granuloma annulare: Secondary | ICD-10-CM

## 2023-06-08 ENCOUNTER — Encounter (HOSPITAL_BASED_OUTPATIENT_CLINIC_OR_DEPARTMENT_OTHER): Payer: Self-pay | Admitting: Physical Therapy

## 2023-06-08 ENCOUNTER — Ambulatory Visit (HOSPITAL_BASED_OUTPATIENT_CLINIC_OR_DEPARTMENT_OTHER): Payer: Commercial Managed Care - HMO | Admitting: Physical Therapy

## 2023-06-08 DIAGNOSIS — M5459 Other low back pain: Secondary | ICD-10-CM

## 2023-06-08 DIAGNOSIS — M25552 Pain in left hip: Secondary | ICD-10-CM

## 2023-06-08 DIAGNOSIS — M62838 Other muscle spasm: Secondary | ICD-10-CM

## 2023-06-08 NOTE — Therapy (Signed)
OUTPATIENT PHYSICAL THERAPY THORACOLUMBAR TREATMENT   Patient Name: Kim Watson MRN: [id number] DOB:1971-08-19, 52 y.o., female Today's Date: 06/08/2023  END OF SESSION:  PT End of Session - 06/08/23 1122     Visit Number 7    Number of Visits 14    Date for PT Re-Evaluation 08/06/23    Authorization Type [employer]    PT Start Time 1118    PT Stop Time 1158    PT Time Calculation (min) 40 min    Activity Tolerance Patient tolerated treatment well    Behavior During Therapy WFL for tasks assessed/performed               Past Medical History:  Diagnosis Date   Anxiety    Arthritis    Depression    Gallbladder problem    GERD (gastroesophageal reflux disease)    High blood pressure    Hypertension    Migraine    Obesity    Obstructive sleep apnea    2016 was cleared from having to use CPAP at night. Gastric bypass surgery 2015.   Vitamin D deficiency    Past Surgical History:  Procedure Laterality Date   ANTERIOR CERVICAL DECOMP/DISCECTOMY FUSION N/A 10/02/2022   Procedure: ANTERIOR CERVICAL DISCECTOMY AND FUSION CERVICAL FOUR TO SEVEN;  Surgeon: Venita Lick, MD;  Location: [facility] OR;  Service: Orthopedics;  Laterality: N/A;  4hrs 3 C-Bed   CHOLECYSTECTOMY     FOOT SURGERY Left 07/26/2022   5th metatarsal   SLEEVE GASTROPLASTY     TUBAL LIGATION     Patient Active Problem List   Diagnosis Date Noted   Bronchitis 05/15/2023   Back pain 04/14/2023   Symptomatic mammary hypertrophy 04/14/2023   Bilateral carpal tunnel syndrome 12/12/2022   Tendonitis 12/12/2022   Cervical myelopathy (HCC) 10/02/2022   Preop examination 09/16/2022   COVID-19 08/20/2022   Acute nonintractable headache 08/20/2022   Pain in right shoulder 06/27/2022   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/07/2022   Primary hypertension 05/07/2022   Neck pain 05/07/2022   Acute left-sided low back pain without sciatica 02/24/2022   Bursitis of left shoulder 01/10/2022   Dry mouth 01/10/2022   History of  bariatric surgery 01/10/2022   Elevated blood pressure reading 01/10/2022   Anxiety and depression 01/10/2022   Gastroesophageal reflux disease 01/10/2022   Chronic migraine without aura without status migrainosus, not intractable 08/31/2018   Prediabetes 07/13/2017   At risk for diabetes mellitus 07/02/2017   Insulin resistance 07/02/2017   Vitamin D deficiency 07/02/2017   Other hyperlipidemia 07/02/2017   Morbid obesity (HCC) 06/18/2017   Chronic pain of both shoulders 11/02/2014   Arthritis of both knees 10/04/2014     REFERRING PROVIDER: Venita Lick, MD  REFERRING DIAG: M54.51 (ICD-10-CM) - Vertebrogenic low back pain  Rationale for Evaluation and Treatment: Rehabilitation  THERAPY DIAG:  Other low back pain  Other muscle spasm  Pain in left hip  ONSET DATE: Chronic / Exacerbation March 2023  SUBJECTIVE:  SUBJECTIVE STATEMENT: Pt denies any adverse effects after prior Rx. She complains of a knot in her Lt shoulder blade; unsure how it got there.     PERTINENT HISTORY:  MRI findings including grade 1 anterolisthesis of L4 on L5 Arthritis in bilat knees, Thoracic pain, obesity, migraines, anxiety, and depression HTN, R thumb CMC OA, Bilat carpal tunnel syndrome PSHx:  Anterior cervical fusion (C4-C7) in 10/2022, Bariatric surgery, L foot surgery 5th MT in 2023     PAIN:  Are you having pain? Yes Location:  R sided lumbar / L glute  NPRS:  6/10 Type:  Constant pain; feels a pinch Pt does have some N/T in L thigh  PRECAUTIONS: Other: Grade 1 anterolisthesis of L4-L5, C4-C7 fusion  WEIGHT BEARING RESTRICTIONS: No  FALLS:  Has patient fallen in last 6 months? 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   PATIENT GOALS: to be able to active with grandkids, to be pain free   OBJECTIVE:   DIAGNOSTIC FINDINGS:  Lumbar MRI in 2023: IMPRESSION: 1. Slight progression of previously noted grade 1 anterolisthesis of L4 on L5, with new right paracentral disc extrusion with 6 mm cranial migration and new mild-to-moderate spinal canal stenosis. Unchanged mild left and mild-to-moderate right neural foraminal narrowing. Effacement of the lateral recesses at this level likely affects the descending L5 nerve roots. 2. Multilevel facet arthropathy, which is moderate at L3-L4 and severe at L4-L5, which can also be a source of back pain.  PATIENT SURVEYS:  Eval: FOTO 26 with a goal of 42 at visit #13 06/08/23: 44  TODAY'S TREATMENT:                                                                                                                              Self care:  pt instructed in self massage with ball to Lt periscapular and Lt hip musculature. Pt returned demo with cues.   Pt seen for aquatic therapy today.  Treatment took place in water 3.5-4.75 ft in depth at the [facility] pool. Temp of water was 90.  Pt entered/exited the pool via stairs independently with bilat rail.  * unsupported: walking forward / backwards with reciprocal arm swing ,side stepping with shoulder abd/add * marching with alternating row with rainbow hand floats * farmers carry with bilat / single rainbow hand float at side; switched to solid noodle at side * tricep press downs with yellow noodle at side x 8 each side * SLS with forward lean, UE on yellow noodle * staggered stance with bilat horiz abdct x 5 (UE on rainbow hand floats) *Kickboard push/pull with TrA x10 in staggered stance each leg forward x 2 * straddling noodle and holding yellow hand floats: cycling 3 laps  Pt requires the buoyancy and hydrostatic pressure  of  water for support, and to offload joints by unweighting joint load by at least 50 % in navel deep water and by at least 75-80% in chest to neck deep water.  Viscosity of the water is needed for resistance of strengthening. Water current perturbations provides challenge to standing balance requiring increased core activation.   PATIENT EDUCATION:  Education details: aquatic therapy exercise form  Person educated: Patient Education method: Explanation, demonstration, verbal cues Education comprehension: verbalized understanding, returned demonstration, verbal cues required  HOME EXERCISE PROGRAM: Pt has a HEP.  Will give further HEP at a later date.   ASSESSMENT:  CLINICAL IMPRESSION: Pt reported some relief of pain with self massage with instruction and pain significantly reduced (to 0/10) when exercising in the water.  Pt to look into pool options for intent to transfer to when d/c from aquatic therapy.   FOTO improved to 44; she has met this goal.  Pt should benefit from continued skilled PT services including aquatic therapy to address impairments and goals and improve overall function.     OBJECTIVE IMPAIRMENTS: decreased activity tolerance, decreased mobility, difficulty walking, decreased ROM, decreased strength, hypomobility, increased fascial restrictions, increased muscle spasms, impaired flexibility, and pain.   ACTIVITY LIMITATIONS: bending, transfers, bed mobility, and locomotion level  PARTICIPATION LIMITATIONS: occupation and taking care of granchildren  PERSONAL FACTORS: Time since onset of injury/illness/exacerbation and 3+ comorbidities: Arthritis in bilat knees, Thoracic pain, obesity, cervical fusion, anxiety, and depression  are also affecting patient's functional outcome.   REHAB POTENTIAL: Good  CLINICAL DECISION MAKING: Evolving/moderate complexity  EVALUATION COMPLEXITY: Moderate   GOALS:  SHORT TERM GOALS:  Pt will tolerate aquatic therapy without  adverse effects for improved core strength, pain, function, and tolerance to activity.  Baseline: Goal status:MET - 06/08/23 Target date:  06/04/2023  2.  Pt will demonstrate improved soft tissue tightness in L sided lumbar paraspinals and reduced tenderness in glute and piriformis for improved pain, tension, mobility, and ease with daily activities.  Baseline:  Goal status: INITIAL Target date:  06/18/2023   3.  Pt will report at least a 25% improvement in pain and sx's overall.  Baseline:  Goal status: INITIAL Target date:  06/11/2023  4.  Pt will progress with aquatic exercises without adverse effects for improved core strength, mobility and improved tolerance with daily activities and work activities Baseline:  Goal status: INITIAL    LONG TERM GOALS: Target date: 08/06/2023  Pt will be able to ambulate her normal community distance and perform her normal work activities without her L LE wanting to give way.  Baseline:  Goal status: INITIAL  2.  Pt will report at least a 70% improvement in pain with work activities.  Baseline:  Goal status: INITIAL  3.  Pt will demo proper body mechanics with hip hinge/squatting to lift instead of bending to reduce lumbar stress and strain with functional activities at home and at work.  Baseline:  Goal status: INITIAL  4.  Pt will be independent with aquatic and land based HEP for improved core strength, pain, tolerance to activity, and function.   Baseline:  Goal status: INITIAL  5.  Pt will be able to perform her ADLs and IADLs without significant pain and difficulty.  Baseline:  Goal status: INITIAL    PLAN:  PT FREQUENCY: 2x/week  PT DURATION: 12 weeks  PLANNED INTERVENTIONS: Therapeutic exercises, Therapeutic activity, Neuromuscular re-education, Balance training, Gait training, Patient/Family education, Self Care, Joint mobilization, Stair training, Aquatic Therapy, Dry Needling, Electrical  stimulation, Spinal mobilization,  Cryotherapy, Moist heat, Taping, Ultrasound, Manual therapy, and Re-evaluation.  PLAN FOR NEXT SESSION: Cont with aquatic therapy.  Core strengthening and STW in land therapy.  PT will be interrupted due to Pt planning on having  bariatric surgery and breast reduction surgery.  PT will be put on hold at that time and will need to be cleared by MD to return to PT.   Mayer Camel, PTA 06/08/23 2:05 PM [facility] [address] [place], [place], [address] Phone: [phone]   Fax:  [phone]

## 2023-06-09 NOTE — Telephone Encounter (Signed)
I called andnd spoke with patient and she is requesting refill of Valacyclovir 500mg  and Clotrimazole-Betametasone cream. Patient said that she is doing aquatic therapy classes and uses this before getting in the pool. She also expressed that she wanted a bigger size if possible. I called [facility] pharmacy and the cream comes in 45g and a lotion.  Requesting: Valacyclovir 500mg  and Clotrimazole-Betametasone cream Last Visit: 05/15/2023 Next Visit: NO FOV Last Refill: 05/15/2023  Please Advise

## 2023-06-10 ENCOUNTER — Other Ambulatory Visit: Payer: Self-pay | Admitting: Nurse Practitioner

## 2023-06-10 ENCOUNTER — Other Ambulatory Visit: Payer: Self-pay | Admitting: Sports Medicine

## 2023-06-10 ENCOUNTER — Encounter (HOSPITAL_BASED_OUTPATIENT_CLINIC_OR_DEPARTMENT_OTHER): Payer: Self-pay | Admitting: Physical Therapy

## 2023-06-10 ENCOUNTER — Ambulatory Visit (HOSPITAL_BASED_OUTPATIENT_CLINIC_OR_DEPARTMENT_OTHER): Payer: Commercial Managed Care - HMO | Admitting: Physical Therapy

## 2023-06-10 ENCOUNTER — Encounter: Payer: Self-pay | Admitting: Nurse Practitioner

## 2023-06-10 DIAGNOSIS — M5459 Other low back pain: Secondary | ICD-10-CM

## 2023-06-10 DIAGNOSIS — M25552 Pain in left hip: Secondary | ICD-10-CM

## 2023-06-10 DIAGNOSIS — R262 Difficulty in walking, not elsewhere classified: Secondary | ICD-10-CM

## 2023-06-10 DIAGNOSIS — M62838 Other muscle spasm: Secondary | ICD-10-CM

## 2023-06-10 MED ORDER — GABAPENTIN 300 MG PO CAPS: 300.0000 mg | ORAL_CAPSULE | Freq: Two times a day (BID) | ORAL | 0 refills | Status: DC

## 2023-06-10 MED ORDER — VALACYCLOVIR HCL 500 MG PO TABS
500.0000 mg | ORAL_TABLET | Freq: Two times a day (BID) | ORAL | 1 refills | Status: DC | PRN
Start: 2023-06-10 — End: 2024-03-29

## 2023-06-10 NOTE — Therapy (Signed)
OUTPATIENT PHYSICAL THERAPY THORACOLUMBAR TREATMENT   Patient Name: Kim Watson MRN: [id number] DOB:08/10/1971, 52 y.o., female Today's Date: 06/10/2023  END OF SESSION:  PT End of Session - 06/10/23 1214     Visit Number 8    Number of Visits 14    Date for PT Re-Evaluation 08/06/23    Authorization Type [employer]    PT Start Time 1158    PT Stop Time 1240    PT Time Calculation (min) 42 min    Activity Tolerance Patient tolerated treatment well    Behavior During Therapy WFL for tasks assessed/performed               Past Medical History:  Diagnosis Date   Anxiety    Arthritis    Depression    Gallbladder problem    GERD (gastroesophageal reflux disease)    High blood pressure    Hypertension    Migraine    Obesity    Obstructive sleep apnea    2016 was cleared from having to use CPAP at night. Gastric bypass surgery 2015.   Vitamin D deficiency    Past Surgical History:  Procedure Laterality Date   ANTERIOR CERVICAL DECOMP/DISCECTOMY FUSION N/A 10/02/2022   Procedure: ANTERIOR CERVICAL DISCECTOMY AND FUSION CERVICAL FOUR TO SEVEN;  Surgeon: Venita Lick, MD;  Location: [facility] OR;  Service: Orthopedics;  Laterality: N/A;  4hrs 3 C-Bed   CHOLECYSTECTOMY     FOOT SURGERY Left 07/26/2022   5th metatarsal   SLEEVE GASTROPLASTY     TUBAL LIGATION     Patient Active Problem List   Diagnosis Date Noted   Bronchitis 05/15/2023   Back pain 04/14/2023   Symptomatic mammary hypertrophy 04/14/2023   Bilateral carpal tunnel syndrome 12/12/2022   Tendonitis 12/12/2022   Cervical myelopathy (HCC) 10/02/2022   Preop examination 09/16/2022   COVID-19 08/20/2022   Acute nonintractable headache 08/20/2022   Pain in right shoulder 06/27/2022   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/07/2022   Primary hypertension 05/07/2022   Neck pain 05/07/2022   Acute left-sided low back pain without sciatica 02/24/2022   Bursitis of left shoulder 01/10/2022   Dry mouth 01/10/2022   History of  bariatric surgery 01/10/2022   Elevated blood pressure reading 01/10/2022   Anxiety and depression 01/10/2022   Gastroesophageal reflux disease 01/10/2022   Chronic migraine without aura without status migrainosus, not intractable 08/31/2018   Prediabetes 07/13/2017   At risk for diabetes mellitus 07/02/2017   Insulin resistance 07/02/2017   Vitamin D deficiency 07/02/2017   Other hyperlipidemia 07/02/2017   Morbid obesity (HCC) 06/18/2017   Chronic pain of both shoulders 11/02/2014   Arthritis of both knees 10/04/2014     REFERRING PROVIDER: Venita Lick, MD  REFERRING DIAG: M54.51 (ICD-10-CM) - Vertebrogenic low back pain  Rationale for Evaluation and Treatment: Rehabilitation  THERAPY DIAG:  Other low back pain  Other muscle spasm  Pain in left hip  Difficulty in walking, not elsewhere classified  ONSET DATE: Chronic / Exacerbation March 2023  SUBJECTIVE:  SUBJECTIVE STATEMENT: Pt reports she used ball for self massage to Lt shoulder and that pain subsided.  She plans to look at [facility], but plans to go to daughter's community pool in the meantime. She bought 2 noodles to use.      PERTINENT HISTORY:  MRI findings including grade 1 anterolisthesis of L4 on L5 Arthritis in bilat knees, Thoracic pain, obesity, migraines, anxiety, and depression HTN, R thumb CMC OA, Bilat carpal tunnel syndrome PSHx:  Anterior cervical fusion (C4-C7) in 10/2022, Bariatric surgery, L foot surgery 5th MT in 2023     PAIN:  Are you having pain? Yes Location:  L sided lumbar / L glute  NPRS:  6/10 Type:  Constant pain; sharp   PRECAUTIONS: Other: Grade 1 anterolisthesis of L4-L5, C4-C7 fusion  WEIGHT BEARING RESTRICTIONS: No  FALLS:  Has patient fallen in last 6 months? No   OCCUPATION:  Conservation officer, nature and also preparing food at [employer]  PLOF: IndependentPt has chronic pain though was able to perform her daily activities, daily mobility, and work activities with less pain.   PATIENT GOALS: to be able to active with grandkids, to be pain free   OBJECTIVE:   DIAGNOSTIC FINDINGS:  Lumbar MRI in 2023: IMPRESSION: 1. Slight progression of previously noted grade 1 anterolisthesis of L4 on L5, with new right paracentral disc extrusion with 6 mm cranial migration and new mild-to-moderate spinal canal stenosis. Unchanged mild left and mild-to-moderate right neural foraminal narrowing. Effacement of the lateral recesses at this level likely affects the descending L5 nerve roots. 2. Multilevel facet arthropathy, which is moderate at L3-L4 and severe at L4-L5, which can also be a source of back pain.  PATIENT SURVEYS:  Eval: FOTO 26 with a goal of 42 at visit #13 06/08/23: 44  TODAY'S TREATMENT:                                                                                                                              Pt seen for aquatic therapy today.  Treatment took place in water 3.5-4.75 ft in depth at the [facility] pool. Temp of water was 90.  Pt entered/exited the pool via stairs independently with bilat rail.  * unsupported: walking forward * straddling noodle and holding yellow hand floats: cycling 3 laps, cross country ski x 20, suspended jumping jack LEs x 20 * holding noodle:  hip abdct/ addct x 10; leg swings into hip flex/ext to toe touch x 10; hip crosses/ hip openers x 10 each * fig 4 stretch holding wall 15s x 2 each LE,  glute stretch (knee to opp shoulder) seated on bench  * farmers carry with bilat / single rainbow hand float at side * side stepping with shoulder abd/add with rainbow hand floats *Kickboard push/pull with TrA x10 in staggered stance each leg forward * Warrior 1 lifting noodle towards ceiling x 5 each LE forward * plank position with  hands on bench,  allowing hips to sag forward for lumbar extension, then plank with alternating hip ext * staggered stance with bilat horiz abdct x 5 (UE on rainbow hand floats) * supported supine with yellow noodle under arms and noodle at knees x 3 min for decompression  PATIENT EDUCATION:  Education details: aquatic therapy exercise form  Person educated: Patient Education method: Explanation, demonstration, verbal cues Education comprehension: verbalized understanding, returned demonstration, verbal cues required  HOME EXERCISE PROGRAM: Pt has a HEP.  Will give further HEP at a later date.   ASSESSMENT:  CLINICAL IMPRESSION: Pt arrived with pain flared up from working this morning.  She gets the most relief with lumbar extension.  Pain reduced to 5/10 during session with all exercises tolerated well. She requires only minor cues for form.   Pt should benefit from continued skilled PT services including aquatic therapy to address impairments and goals and improve overall function.     OBJECTIVE IMPAIRMENTS: decreased activity tolerance, decreased mobility, difficulty walking, decreased ROM, decreased strength, hypomobility, increased fascial restrictions, increased muscle spasms, impaired flexibility, and pain.   ACTIVITY LIMITATIONS: bending, transfers, bed mobility, and locomotion level  PARTICIPATION LIMITATIONS: occupation and taking care of granchildren  PERSONAL FACTORS: Time since onset of injury/illness/exacerbation and 3+ comorbidities: Arthritis in bilat knees, Thoracic pain, obesity, cervical fusion, anxiety, and depression  are also affecting patient's functional outcome.   REHAB POTENTIAL: Good  CLINICAL DECISION MAKING: Evolving/moderate complexity  EVALUATION COMPLEXITY: Moderate   GOALS:  SHORT TERM GOALS:  Pt will tolerate aquatic therapy without adverse effects for improved core strength, pain, function, and tolerance to activity.  Baseline: Goal status:MET  - 06/08/23 Target date:  06/04/2023  2.  Pt will demonstrate improved soft tissue tightness in L sided lumbar paraspinals and reduced tenderness in glute and piriformis for improved pain, tension, mobility, and ease with daily activities.  Baseline:  Goal status: INITIAL Target date:  06/18/2023   3.  Pt will report at least a 25% improvement in pain and sx's overall.  Baseline:  Goal status: INITIAL Target date:  06/11/2023  4.  Pt will progress with aquatic exercises without adverse effects for improved core strength, mobility and improved tolerance with daily activities and work activities Baseline:  Goal status: INITIAL    LONG TERM GOALS: Target date: 08/06/2023  Pt will be able to ambulate her normal community distance and perform her normal work activities without her L LE wanting to give way.  Baseline:  Goal status: INITIAL  2.  Pt will report at least a 70% improvement in pain with work activities.  Baseline:  Goal status: INITIAL  3.  Pt will demo proper body mechanics with hip hinge/squatting to lift instead of bending to reduce lumbar stress and strain with functional activities at home and at work.  Baseline:  Goal status: INITIAL  4.  Pt will be independent with aquatic and land based HEP for improved core strength, pain, tolerance to activity, and function.   Baseline:  Goal status: INITIAL  5.  Pt will be able to perform her ADLs and IADLs without significant pain and difficulty.  Baseline:  Goal status: INITIAL    PLAN:  PT FREQUENCY: 2x/week  PT DURATION: 12 weeks  PLANNED INTERVENTIONS: Therapeutic exercises, Therapeutic activity, Neuromuscular re-education, Balance training, Gait training, Patient/Family education, Self Care, Joint mobilization, Stair training, Aquatic Therapy, Dry Needling, Electrical stimulation, Spinal mobilization, Cryotherapy, Moist heat, Taping, Ultrasound, Manual therapy, and Re-evaluation.  PLAN FOR NEXT SESSION: Cont with  aquatic  therapy.  Core strengthening and STW in land therapy.  PT will be interrupted due to Pt planning on having  bariatric surgery and breast reduction surgery.  PT will be put on hold at that time and will need to be cleared by MD to return to PT.    Mayer Camel, PTA 06/10/23 12:55 PM [facility] [address] [place], [place], [address] Phone: [phone]   Fax:  [phone]

## 2023-06-10 NOTE — Telephone Encounter (Signed)
Requesting: PANTOPRAZOLE 40MG  TABLETS  Last Visit: 05/15/2023 Next Visit: Visit date not found Last Refill: 03/12/2023  Please Advise

## 2023-06-10 NOTE — Telephone Encounter (Signed)
LVM that Rx was sent to pharmacy

## 2023-06-16 ENCOUNTER — Encounter (HOSPITAL_BASED_OUTPATIENT_CLINIC_OR_DEPARTMENT_OTHER): Payer: Self-pay | Admitting: Physical Therapy

## 2023-06-16 ENCOUNTER — Ambulatory Visit (HOSPITAL_BASED_OUTPATIENT_CLINIC_OR_DEPARTMENT_OTHER): Payer: Commercial Managed Care - HMO | Admitting: Physical Therapy

## 2023-06-16 DIAGNOSIS — M5459 Other low back pain: Secondary | ICD-10-CM

## 2023-06-16 DIAGNOSIS — M25552 Pain in left hip: Secondary | ICD-10-CM

## 2023-06-16 DIAGNOSIS — M62838 Other muscle spasm: Secondary | ICD-10-CM

## 2023-06-16 NOTE — Therapy (Signed)
OUTPATIENT PHYSICAL THERAPY THORACOLUMBAR TREATMENT   Patient Name: Kim Watson MRN: [id number] DOB:[birth year], 52 y.o., female Today's Date: 06/16/2023  END OF SESSION:  PT End of Session - 06/16/23 1243     Visit Number 9    Number of Visits 14    Date for PT Re-Evaluation 08/06/23    Authorization Type [employer]    PT Start Time 1206    PT Stop Time 1245    PT Time Calculation (min) 39 min    Activity Tolerance Patient tolerated treatment well    Behavior During Therapy WFL for tasks assessed/performed                Past Medical History:  Diagnosis Date   Anxiety    Arthritis    Depression    Gallbladder problem    GERD (gastroesophageal reflux disease)    High blood pressure    Hypertension    Migraine    Obesity    Obstructive sleep apnea    2016 was cleared from having to use CPAP at night. Gastric bypass surgery 2015.   Vitamin D deficiency    Past Surgical History:  Procedure Laterality Date   ANTERIOR CERVICAL DECOMP/DISCECTOMY FUSION N/A 10/02/2022   Procedure: ANTERIOR CERVICAL DISCECTOMY AND FUSION CERVICAL FOUR TO SEVEN;  Surgeon: Venita Lick, MD;  Location: [facility] OR;  Service: Orthopedics;  Laterality: N/A;  4hrs 3 C-Bed   CHOLECYSTECTOMY     FOOT SURGERY Left 07/26/2022   5th metatarsal   SLEEVE GASTROPLASTY     TUBAL LIGATION     Patient Active Problem List   Diagnosis Date Noted   Bronchitis 05/15/2023   Back pain 04/14/2023   Symptomatic mammary hypertrophy 04/14/2023   Bilateral carpal tunnel syndrome 12/12/2022   Tendonitis 12/12/2022   Cervical myelopathy (HCC) 10/02/2022   Preop examination 09/16/2022   COVID-19 08/20/2022   Acute nonintractable headache 08/20/2022   Pain in right shoulder 06/27/2022   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/07/2022   Primary hypertension 05/07/2022   Neck pain 05/07/2022   Acute left-sided low back pain without sciatica 02/24/2022   Bursitis of left shoulder 01/10/2022   Dry mouth 01/10/2022   History of  bariatric surgery 01/10/2022   Elevated blood pressure reading 01/10/2022   Anxiety and depression 01/10/2022   Gastroesophageal reflux disease 01/10/2022   Chronic migraine without aura without status migrainosus, not intractable 08/31/2018   Prediabetes 07/13/2017   At risk for diabetes mellitus 07/02/2017   Insulin resistance 07/02/2017   Vitamin D deficiency 07/02/2017   Other hyperlipidemia 07/02/2017   Morbid obesity (HCC) 06/18/2017   Chronic pain of both shoulders 11/02/2014   Arthritis of both knees 10/04/2014     REFERRING PROVIDER: Venita Lick, MD  REFERRING DIAG: M54.51 (ICD-10-CM) - Vertebrogenic low back pain  Rationale for Evaluation and Treatment: Rehabilitation  THERAPY DIAG:  Other low back pain  Other muscle spasm  Pain in left hip  ONSET DATE: Chronic / Exacerbation March 2023  SUBJECTIVE:  SUBJECTIVE STATEMENT: Pt reports she is using the self massage ball which is controlling her shoulder pain. Hoping to modify job on Weds to avoid increased pain on Thursdays.    PERTINENT HISTORY:  MRI findings including grade 1 anterolisthesis of L4 on L5 Arthritis in bilat knees, Thoracic pain, obesity, migraines, anxiety, and depression HTN, R thumb CMC OA, Bilat carpal tunnel syndrome PSHx:  Anterior cervical fusion (C4-C7) in 10/2022, Bariatric surgery, L foot surgery 5th MT in 2023     PAIN:  Are you having pain? Yes Location:  L sided lumbar / L glute  NPRS:  4/10 Type:  Constant pain; sharp   PRECAUTIONS: Other: Grade 1 anterolisthesis of L4-L5, C4-C7 fusion  WEIGHT BEARING RESTRICTIONS: No  FALLS:  Has patient fallen in last 6 months? No   OCCUPATION: Conservation officer, nature and also preparing food at [employer]  PLOF: IndependentPt has chronic pain though was able to  perform her daily activities, daily mobility, and work activities with less pain.   PATIENT GOALS: to be able to active with grandkids, to be pain free   OBJECTIVE:   DIAGNOSTIC FINDINGS:  Lumbar MRI in 2023: IMPRESSION: 1. Slight progression of previously noted grade 1 anterolisthesis of L4 on L5, with new right paracentral disc extrusion with 6 mm cranial migration and new mild-to-moderate spinal canal stenosis. Unchanged mild left and mild-to-moderate right neural foraminal narrowing. Effacement of the lateral recesses at this level likely affects the descending L5 nerve roots. 2. Multilevel facet arthropathy, which is moderate at L3-L4 and severe at L4-L5, which can also be a source of back pain.  PATIENT SURVEYS:  Eval: FOTO 26 with a goal of 42 at visit #13 06/08/23: 44  TODAY'S TREATMENT:                                                                                                                              Pt seen for aquatic therapy today.  Treatment took place in water 3.5-4.75 ft in depth at the [facility] pool. Temp of water was 90.  Pt entered/exited the pool via stairs independently with bilat rail.  * unsupported: walking forward * straddling noodle unsupported: cycling 3 laps, cross country ski x 20, suspended jumping jack LEs x 20 * farmers carry with bilat / single rainbow hand float at side * Warrior 1 lifting noodle towards ceiling x 5 each LE forward *TrA sets: Solid noodle push down le staggered then wide stance x 10 ea  - kick board press x 5 20s hold *"old man stretch" *Bow and arrow x 10 R/L * side stepping with shoulder abd/add with rainbow hand floats *decompression using noodle  Pt requires the buoyancy and hydrostatic pressure of water for support, and to offload joints by unweighting joint load by at least 50 % in navel deep water and by at least 75-80% in chest to neck deep water.  Viscosity of the water is needed for resistance of  strengthening. Water current perturbations provides challenge to standing balance requiring increased core activation.    PATIENT EDUCATION:  Education details: aquatic therapy exercise form  Person educated: Patient Education method: Explanation, demonstration, verbal cues Education comprehension: verbalized understanding, returned demonstration, verbal cues required  HOME EXERCISE PROGRAM: Pt has a HEP.  Will give further HEP at a later date.   ASSESSMENT:  CLINICAL IMPRESSION: Pt reports at least a 25% improvement in overall pain since beginning of therapy with specific improvement in LB right side and hip. Left sided discomfort continues but is managed with modifying work positioning. Lumbar extension continues to provide greatest relief from LBP. She reports she has gotten stronger and does not tire as quickly.  STG addressed 3/4 met. Goals ongoing.    OBJECTIVE IMPAIRMENTS: decreased activity tolerance, decreased mobility, difficulty walking, decreased ROM, decreased strength, hypomobility, increased fascial restrictions, increased muscle spasms, impaired flexibility, and pain.   ACTIVITY LIMITATIONS: bending, transfers, bed mobility, and locomotion level  PARTICIPATION LIMITATIONS: occupation and taking care of granchildren  PERSONAL FACTORS: Time since onset of injury/illness/exacerbation and 3+ comorbidities: Arthritis in bilat knees, Thoracic pain, obesity, cervical fusion, anxiety, and depression  are also affecting patient's functional outcome.   REHAB POTENTIAL: Good  CLINICAL DECISION MAKING: Evolving/moderate complexity  EVALUATION COMPLEXITY: Moderate   GOALS:  SHORT TERM GOALS:  Pt will tolerate aquatic therapy without adverse effects for improved core strength, pain, function, and tolerance to activity.  Baseline: Goal status:MET - 06/08/23 Target date:  06/04/2023  2.  Pt will demonstrate improved soft tissue tightness in L sided lumbar paraspinals and  reduced tenderness in glute and piriformis for improved pain, tension, mobility, and ease with daily activities.  Baseline:  Goal status: INITIAL Target date:  06/18/2023   3.  Pt will report at least a 25% improvement in pain and sx's overall.  Baseline:  Goal status: Met 06/16/23 Target date:  06/11/2023  4.  Pt will progress with aquatic exercises without adverse effects for improved core strength, mobility and improved tolerance with daily activities and work activities Baseline:  Goal status: Met 06/16/23    LONG TERM GOALS: Target date: 08/06/2023  Pt will be able to ambulate her normal community distance and perform her normal work activities without her L LE wanting to give way.  Baseline:  Goal status: INITIAL  2.  Pt will report at least a 70% improvement in pain with work activities.  Baseline:  Goal status: INITIAL  3.  Pt will demo proper body mechanics with hip hinge/squatting to lift instead of bending to reduce lumbar stress and strain with functional activities at home and at work.  Baseline:  Goal status: INITIAL  4.  Pt will be independent with aquatic and land based HEP for improved core strength, pain, tolerance to activity, and function.   Baseline:  Goal status: INITIAL  5.  Pt will be able to perform her ADLs and IADLs without significant pain and difficulty.  Baseline:  Goal status: INITIAL    PLAN:  PT FREQUENCY: 2x/week  PT DURATION: 12 weeks  PLANNED INTERVENTIONS: Therapeutic exercises, Therapeutic activity, Neuromuscular re-education, Balance training, Gait training, Patient/Family education, Self Care, Joint mobilization, Stair training, Aquatic Therapy, Dry Needling, Electrical stimulation, Spinal mobilization, Cryotherapy, Moist heat, Taping, Ultrasound, Manual therapy, and Re-evaluation.  PLAN FOR NEXT SESSION: Cont with aquatic therapy.  Core strengthening and STW in land therapy.  PT will be interrupted due to Pt planning on having   bariatric surgery and breast reduction surgery.  PT will be put on hold at that time and will need to be cleared by MD to return to PT.   Corrie Dandy [place]) Dennie Vecchio MPT 06/16/23 1:17 PM [facility] [address] [place], [place], [address] Phone: [phone]   Fax:  [phone]

## 2023-06-19 ENCOUNTER — Encounter (HOSPITAL_BASED_OUTPATIENT_CLINIC_OR_DEPARTMENT_OTHER): Payer: Self-pay | Admitting: Physical Therapy

## 2023-06-19 ENCOUNTER — Ambulatory Visit (HOSPITAL_BASED_OUTPATIENT_CLINIC_OR_DEPARTMENT_OTHER): Payer: Commercial Managed Care - HMO | Admitting: Physical Therapy

## 2023-06-19 DIAGNOSIS — R262 Difficulty in walking, not elsewhere classified: Secondary | ICD-10-CM

## 2023-06-19 DIAGNOSIS — M5459 Other low back pain: Secondary | ICD-10-CM | POA: Diagnosis not present

## 2023-06-19 DIAGNOSIS — M62838 Other muscle spasm: Secondary | ICD-10-CM

## 2023-06-19 DIAGNOSIS — M25552 Pain in left hip: Secondary | ICD-10-CM

## 2023-06-19 NOTE — Therapy (Signed)
OUTPATIENT PHYSICAL THERAPY THORACOLUMBAR TREATMENT   Patient Name: Kim Watson MRN: [id number] DOB:06-Jan-1971, 52 y.o., female Today's Date: 06/19/2023  END OF SESSION:  PT End of Session - 06/19/23 1444     Visit Number 10    Number of Visits 14    Date for PT Re-Evaluation 08/06/23    Authorization Type [employer]    PT Start Time 1436   pt arrived late   PT Stop Time 1515    PT Time Calculation (min) 39 min    Activity Tolerance Patient tolerated treatment well    Behavior During Therapy WFL for tasks assessed/performed                Past Medical History:  Diagnosis Date   Anxiety    Arthritis    Depression    Gallbladder problem    GERD (gastroesophageal reflux disease)    High blood pressure    Hypertension    Migraine    Obesity    Obstructive sleep apnea    2016 was cleared from having to use CPAP at night. Gastric bypass surgery 2015.   Vitamin D deficiency    Past Surgical History:  Procedure Laterality Date   ANTERIOR CERVICAL DECOMP/DISCECTOMY FUSION N/A 10/02/2022   Procedure: ANTERIOR CERVICAL DISCECTOMY AND FUSION CERVICAL FOUR TO SEVEN;  Surgeon: Venita Lick, MD;  Location: [facility] OR;  Service: Orthopedics;  Laterality: N/A;  4hrs 3 C-Bed   CHOLECYSTECTOMY     FOOT SURGERY Left 07/26/2022   5th metatarsal   SLEEVE GASTROPLASTY     TUBAL LIGATION     Patient Active Problem List   Diagnosis Date Noted   Bronchitis 05/15/2023   Back pain 04/14/2023   Symptomatic mammary hypertrophy 04/14/2023   Bilateral carpal tunnel syndrome 12/12/2022   Tendonitis 12/12/2022   Cervical myelopathy (HCC) 10/02/2022   Preop examination 09/16/2022   COVID-19 08/20/2022   Acute nonintractable headache 08/20/2022   Pain in right shoulder 06/27/2022   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/07/2022   Primary hypertension 05/07/2022   Neck pain 05/07/2022   Acute left-sided low back pain without sciatica 02/24/2022   Bursitis of left shoulder 01/10/2022   Dry mouth  01/10/2022   History of bariatric surgery 01/10/2022   Elevated blood pressure reading 01/10/2022   Anxiety and depression 01/10/2022   Gastroesophageal reflux disease 01/10/2022   Chronic migraine without aura without status migrainosus, not intractable 08/31/2018   Prediabetes 07/13/2017   At risk for diabetes mellitus 07/02/2017   Insulin resistance 07/02/2017   Vitamin D deficiency 07/02/2017   Other hyperlipidemia 07/02/2017   Morbid obesity (HCC) 06/18/2017   Chronic pain of both shoulders 11/02/2014   Arthritis of both knees 10/04/2014     REFERRING PROVIDER: Venita Lick, MD  REFERRING DIAG: M54.51 (ICD-10-CM) - Vertebrogenic low back pain  Rationale for Evaluation and Treatment: Rehabilitation  THERAPY DIAG:  Other low back pain  Other muscle spasm  Pain in left hip  Difficulty in walking, not elsewhere classified  ONSET DATE: Chronic / Exacerbation March 2023  SUBJECTIVE:  SUBJECTIVE STATEMENT: Pt reports she spoke to her boss about possibly switching positions. 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     PERTINENT HISTORY:  MRI findings including grade 1 anterolisthesis of L4 on L5 Arthritis in bilat knees, Thoracic pain, obesity, migraines, anxiety, and depression HTN, R thumb CMC OA, Bilat carpal tunnel syndrome PSHx:  Anterior cervical fusion (C4-C7) in 10/2022, Bariatric surgery, L foot surgery 5th MT in 2023     PAIN:  Are you having pain? Yes Location:  L sided lumbar / L glute  NPRS:  4/10 Type:  Constant pain; sharp   PRECAUTIONS: Other: Grade 1 anterolisthesis of L4-L5, C4-C7 fusion  WEIGHT BEARING RESTRICTIONS: No  FALLS:  Has patient fallen in last 6 months?  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   PATIENT GOALS: to be able to active with grandkids, to be pain free   OBJECTIVE:   DIAGNOSTIC FINDINGS:  Lumbar MRI in 2023: IMPRESSION: 1. Slight progression of previously noted grade 1 anterolisthesis of L4 on L5, with new right paracentral disc extrusion with 6 mm cranial migration and new mild-to-moderate spinal canal stenosis. Unchanged mild left and mild-to-moderate right neural foraminal narrowing. Effacement of the lateral recesses at this level likely affects the descending L5 nerve roots. 2. Multilevel facet arthropathy, which is moderate at L3-L4 and severe at L4-L5, which can also be a source of back pain.  PATIENT SURVEYS:  Eval: FOTO 26 with a goal of 42 at visit #13 06/08/23: 44  TODAY'S TREATMENT:                                                                                                                              Pt seen for aquatic therapy today.  Treatment took place in water 3.5-4.75 ft in depth at the [facility] pool. Temp of water was 90.  Pt entered/exited the pool via stairs independently with bilat rail. * straddling noodle unsupported: cycling 3 laps * unsupported: walking forward/backward with reciprocal arm swing * plank with hands on bench - trunk ext then neutral and hip ext alternating (5x each LE), cues for neutral head  * plank to/from superman with UE on yellow noodle 2 x 5 * side stepping with shoulder abd/add with yellow hand floats * holding wall: alternating single leg clams x 10 *Bow and arrow x 10 R/L, then alternating moving across width of pool  * 3 way LE stretch with ankle on solid noodle x 10sec each position, 2 reps each LE  Pt requires the buoyancy and hydrostatic pressure of water for support, and to offload joints  by unweighting joint load by at least 50 % in navel deep water and by at  least 75-80% in chest to neck deep water.  Viscosity of the water is needed for resistance of strengthening. Water current perturbations provides challenge to standing balance requiring increased core activation.    PATIENT EDUCATION:  Education details: aquatic therapy exercise form  Person educated: Patient Education method: Explanation, demonstration, verbal cues Education comprehension: verbalized understanding, returned demonstration, verbal cues required  HOME EXERCISE PROGRAM: Pt has a HEP.  Will give further HEP at a later date.   ASSESSMENT:  CLINICAL IMPRESSION: Pt tolerated plank / pushup position in water well; able to attain position on noodle without difficulty.  She tolerated 3 way LE stretch well.  Pt remained a 4/10 throughout session. Lumbar extension continues to provide greatest relief from LBP. Pt is progressing gradually towards remaining goals. Plan to create and issue aquatic HEP in next 2 sessions - pt considering joining [employer] or [employer].     OBJECTIVE IMPAIRMENTS: decreased activity tolerance, decreased mobility, difficulty walking, decreased ROM, decreased strength, hypomobility, increased fascial restrictions, increased muscle spasms, impaired flexibility, and pain.   ACTIVITY LIMITATIONS: bending, transfers, bed mobility, and locomotion level  PARTICIPATION LIMITATIONS: occupation and taking care of granchildren  PERSONAL FACTORS: Time since onset of injury/illness/exacerbation and 3+ comorbidities: Arthritis in bilat knees, Thoracic pain, obesity, cervical fusion, anxiety, and depression  are also affecting patient's functional outcome.   REHAB POTENTIAL: Good  CLINICAL DECISION MAKING: Evolving/moderate complexity  EVALUATION COMPLEXITY: Moderate   GOALS:  SHORT TERM GOALS:  Pt will tolerate aquatic therapy without adverse effects for improved core strength, pain, function,  and tolerance to activity.  Baseline: Goal status:MET - 06/08/23 Target date:  06/04/2023  2.  Pt will demonstrate improved soft tissue tightness in L sided lumbar paraspinals and reduced tenderness in glute and piriformis for improved pain, tension, mobility, and ease with daily activities.  Baseline:  Goal status: INITIAL Target date:  06/18/2023   3.  Pt will report at least a 25% improvement in pain and sx's overall.  Baseline:  Goal status: Met 06/16/23 Target date:  06/11/2023  4.  Pt will progress with aquatic exercises without adverse effects for improved core strength, mobility and improved tolerance with daily activities and work activities Baseline:  Goal status: Met 06/16/23    LONG TERM GOALS: Target date: 08/06/2023  Pt will be able to ambulate her normal community distance and perform her normal work activities without her L LE wanting to give way.  Baseline:  Goal status: INITIAL  2.  Pt will report at least a 70% improvement in pain with work activities.  Baseline:  Goal status: INITIAL  3.  Pt will demo proper body mechanics with hip hinge/squatting to lift instead of bending to reduce lumbar stress and strain with functional activities at home and at work.  Baseline:  Goal status: INITIAL  4.  Pt will be independent with aquatic and land based HEP for improved core strength, pain, tolerance to activity, and function.   Baseline:  Goal status: INITIAL  5.  Pt will be able to perform her ADLs and IADLs without significant pain and difficulty.  Baseline:  Goal status: INITIAL    PLAN:  PT FREQUENCY: 2x/week  PT DURATION: 12 weeks  PLANNED INTERVENTIONS: Therapeutic exercises, Therapeutic activity, Neuromuscular re-education, Balance training, Gait training, Patient/Family education, Self Care, Joint mobilization, Stair training, Aquatic Therapy, Dry Needling, Electrical stimulation, Spinal mobilization, Cryotherapy, Moist heat, Taping, Ultrasound, Manual  therapy, and Re-evaluation.  PLAN FOR NEXT SESSION: Cont with aquatic therapy.  Core strengthening  and STW in land therapy.  PT will be interrupted due to Pt planning on having  bariatric surgery and breast reduction surgery.  PT will be put on hold at that time and will need to be cleared by MD to return to PT.   Mayer Camel, PTA 06/19/23 5:07 PM [facility] [address] [place], [place], [address] Phone: [phone]   Fax:  [phone]

## 2023-06-23 ENCOUNTER — Encounter (HOSPITAL_BASED_OUTPATIENT_CLINIC_OR_DEPARTMENT_OTHER): Payer: Self-pay

## 2023-06-23 ENCOUNTER — Ambulatory Visit (HOSPITAL_BASED_OUTPATIENT_CLINIC_OR_DEPARTMENT_OTHER): Payer: Commercial Managed Care - HMO | Admitting: Physical Therapy

## 2023-06-25 ENCOUNTER — Ambulatory Visit (HOSPITAL_BASED_OUTPATIENT_CLINIC_OR_DEPARTMENT_OTHER): Payer: Commercial Managed Care - HMO | Admitting: Physical Therapy

## 2023-06-30 ENCOUNTER — Ambulatory Visit (HOSPITAL_BASED_OUTPATIENT_CLINIC_OR_DEPARTMENT_OTHER): Payer: Commercial Managed Care - HMO | Admitting: Physical Therapy

## 2023-06-30 ENCOUNTER — Telehealth (HOSPITAL_BASED_OUTPATIENT_CLINIC_OR_DEPARTMENT_OTHER): Payer: Self-pay | Admitting: Physical Therapy

## 2023-06-30 NOTE — Telephone Encounter (Signed)
Patient did not show for Aquatic PT appointment.  Called and left voicemail to return phone call to [phone] regarding missed appointment and to confirm next upcoming appointment.    Mayer Camel, PTA 06/30/23 2:21 PM [facility] [address] [place], [place], [address] Phone: [phone]   Fax:  [phone]

## 2023-07-02 ENCOUNTER — Ambulatory Visit (HOSPITAL_BASED_OUTPATIENT_CLINIC_OR_DEPARTMENT_OTHER): Payer: Commercial Managed Care - HMO | Admitting: Physical Therapy

## 2023-07-03 ENCOUNTER — Encounter (HOSPITAL_BASED_OUTPATIENT_CLINIC_OR_DEPARTMENT_OTHER): Payer: Self-pay | Admitting: Physical Therapy

## 2023-07-03 ENCOUNTER — Ambulatory Visit (HOSPITAL_BASED_OUTPATIENT_CLINIC_OR_DEPARTMENT_OTHER): Payer: Commercial Managed Care - HMO | Attending: Orthopedic Surgery | Admitting: Physical Therapy

## 2023-07-03 DIAGNOSIS — M62838 Other muscle spasm: Secondary | ICD-10-CM | POA: Diagnosis present

## 2023-07-03 DIAGNOSIS — M5459 Other low back pain: Secondary | ICD-10-CM | POA: Diagnosis present

## 2023-07-03 NOTE — Therapy (Addendum)
OUTPATIENT PHYSICAL THERAPY THORACOLUMBAR TREATMENT   Patient Name: Kim Watson MRN: [id number] DOB:21-Dec-1970, 52 y.o., female Today's Date: 07/03/2023  END OF SESSION:  PT End of Session - 07/03/23 1123     Visit Number 11    Number of Visits 14    Date for PT Re-Evaluation 08/03/23    Authorization Type [employer]    PT Start Time 1116    PT Stop Time 1201    PT Time Calculation (min) 45 min    Activity Tolerance Patient tolerated treatment well    Behavior During Therapy WFL for tasks assessed/performed                Past Medical History:  Diagnosis Date   Anxiety    Arthritis    Depression    Gallbladder problem    GERD (gastroesophageal reflux disease)    High blood pressure    Hypertension    Migraine    Obesity    Obstructive sleep apnea    2016 was cleared from having to use CPAP at night. Gastric bypass surgery 2015.   Vitamin D deficiency    Past Surgical History:  Procedure Laterality Date   ANTERIOR CERVICAL DECOMP/DISCECTOMY FUSION N/A 10/02/2022   Procedure: ANTERIOR CERVICAL DISCECTOMY AND FUSION CERVICAL FOUR TO SEVEN;  Surgeon: Venita Lick, MD;  Location: [facility] OR;  Service: Orthopedics;  Laterality: N/A;  4hrs 3 C-Bed   CHOLECYSTECTOMY     FOOT SURGERY Left 07/26/2022   5th metatarsal   SLEEVE GASTROPLASTY     TUBAL LIGATION     Patient Active Problem List   Diagnosis Date Noted   Bronchitis 05/15/2023   Back pain 04/14/2023   Symptomatic mammary hypertrophy 04/14/2023   Bilateral carpal tunnel syndrome 12/12/2022   Tendonitis 12/12/2022   Cervical myelopathy (HCC) 10/02/2022   Preop examination 09/16/2022   COVID-19 08/20/2022   Acute nonintractable headache 08/20/2022   Pain in right shoulder 06/27/2022   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/07/2022   Primary hypertension 05/07/2022   Neck pain 05/07/2022   Acute left-sided low back pain without sciatica 02/24/2022   Bursitis of left shoulder 01/10/2022   Dry mouth 01/10/2022   History of  bariatric surgery 01/10/2022   Elevated blood pressure reading 01/10/2022   Anxiety and depression 01/10/2022   Gastroesophageal reflux disease 01/10/2022   Chronic migraine without aura without status migrainosus, not intractable 08/31/2018   Prediabetes 07/13/2017   At risk for diabetes mellitus 07/02/2017   Insulin resistance 07/02/2017   Vitamin D deficiency 07/02/2017   Other hyperlipidemia 07/02/2017   Morbid obesity (HCC) 06/18/2017   Chronic pain of both shoulders 11/02/2014   Arthritis of both knees 10/04/2014     REFERRING PROVIDER: Venita Lick, MD  REFERRING DIAG: M54.51 (ICD-10-CM) - Vertebrogenic low back pain  Rationale for Evaluation and Treatment: Rehabilitation  THERAPY DIAG:  Other low back pain  Other muscle spasm  ONSET DATE: Chronic / Exacerbation March 2023  SUBJECTIVE:  SUBJECTIVE STATEMENT: Pt states her back is feeling ok.  Pt states she was sick last week and had to lay down a lot.  She typically has pain with increased lying though didn't bother her as much.  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  Pt joined [employer] yesterday and plans to use the pool here.  Pt states her surgery is scheduled for 9/3.   HEP COMPLIANCE:  Pt reports she performs the theraband exercises at home from her prior PT.  FUNCTIONAL IMPROVEMENTS:  Pt reported 60% improvement in pain with work activities. Pt is able to bend over and pick up objects at home.  Pt was unable to bend prior to PT.  Pt was able to tie her sister's shoe.  Cleaning home.  Pt able to sweep and mop without significant pain.  Sitting on bleachers at grandchild's football and soccer games.  Work activities. FUNCTIONAL LIMITATIONS:  Increased pain with standing 15-20 mins.  Ambulation  distance.  Work activities.  L LE wanting to give way with primarily standing.  Pt states she pulled something in her L thigh when she took a step yesterday.      PERTINENT HISTORY:  MRI findings including grade 1 anterolisthesis of L4 on L5 Arthritis in bilat knees, Thoracic pain, obesity, migraines, anxiety, and depression HTN, R thumb CMC OA, Bilat carpal tunnel syndrome PSHx:  Anterior cervical fusion (C4-C7) in 10/2022, Bariatric surgery, L foot surgery 5th MT in 2023     PAIN:  Are you having pain? Yes Location:  L sided lumbar   NPRS:  4-5/10 current and best, worst 7/10 Type:  Constant pain; sharp  Location:  L glute NPRS:  7/10 current, 5/10 best, 10/10 worst   PRECAUTIONS: Other: Grade 1 anterolisthesis of L4-L5, C4-C7 fusion  WEIGHT BEARING RESTRICTIONS: No  FALLS:  Has patient fallen in last 6 months? 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   PATIENT GOALS: to be able to active with grandkids, to be pain free   OBJECTIVE:   DIAGNOSTIC FINDINGS:  Lumbar MRI in 2023: IMPRESSION: 1. Slight progression of previously noted grade 1 anterolisthesis of L4 on L5, with new right paracentral disc extrusion with 6 mm cranial migration and new mild-to-moderate spinal canal stenosis. Unchanged mild left and mild-to-moderate right neural foraminal narrowing. Effacement of the lateral recesses at this level likely affects the descending L5 nerve roots. 2. Multilevel facet arthropathy, which is moderate at L3-L4 and severe at L4-L5, which can also be a source of back pain.   TODAY'S TREATMENT:        Therapeutic Exercise:  -Reviewed reported functional progress/deficits, pain levels, and HEP compliance.   -See below for pt education  PATIENT SURVEYS:  Modified Oswestry:  15=30%               LUMBAR ROM:    AROM eval 8/2  Flexion [facility] WFL  Extension     Right lateral flexion 80% WFL  Left lateral flexion 80% 80%  Right rotation [facility] WFL  Left rotation WFL WFL   (Blank rows = not tested)     LUMBAR SPECIAL TESTS:  Supine SLR test:  R:  negative, L: positive  GAIT:  Non-antalgic gait.  Good reciprocal arm swing.  Pt has no limp.  Pt has  increased knee IR with bilat knees touching.                                                                                                                            Manual Therapy: PALPATION: Lumbar paraspinal soft tissue mobility:  R:  WFL, L:  moderate tightness Pt is very tender to palpate L glute and piriformis  Pt received STM to L sided lumbar paraspinals and STM with TPR and to L glute in prone to improve soft tissue tightness and mobility and pain.  PT also used the roller on L glute in prone to improve soft tissue mobility and pain.    PATIENT EDUCATION:  Education details:  POC and discharge planning, objective findings, relevant anatomy.  PT answered pt's questions including exercises to avoid when transitioning to the gym.  PT instructed pt in using a tennis ball to glute to improve soft tissue mobility and tightness. Person educated: Patient Education method: Explanation Education comprehension: verbalized understanding  HOME EXERCISE PROGRAM: Pt has a HEP.  Will give further HEP at a later date.   ASSESSMENT:  CLINICAL IMPRESSION: Pt is making good progress in PT.  She reports being able to bend now which she could not do prior to PT.  Pt is able to perform increased household chores including cleaning with less pain.  She still has pain with work activities though has less pain at work.  She has recently changed her job duties which also helps.  She continues to have pain with standing activities and increased ambulation distance.  She continues to have constant lumbar and L glute pain, though her worst pain in lumbar  has improved.  Pt is progressing well with aquatic exercises and has good tolerance with aquatic therapy.  Pt continues to have soft tissue tightness in L sided lumbar paraspinals and trigger points and tenderness in L glute.  Pt states her L hip/glute felt better after STW.  Pt has met STG's #1,3,4 and partially met LTG #2.  Pt would benefit from cont skilled PT services to establish independence with aquatic program, improve pain, and address ongoing goals.    OBJECTIVE IMPAIRMENTS: decreased activity tolerance, decreased mobility, difficulty walking, decreased ROM, decreased strength, hypomobility, increased fascial restrictions, increased muscle spasms, impaired flexibility, and pain.   ACTIVITY LIMITATIONS: bending, transfers, bed mobility, and locomotion level  PARTICIPATION LIMITATIONS: occupation and taking care of granchildren  PERSONAL FACTORS: Time since onset of injury/illness/exacerbation and 3+ comorbidities: Arthritis in bilat knees, Thoracic pain, obesity, cervical fusion, anxiety, and depression  are also affecting patient's functional outcome.   REHAB POTENTIAL: Good  CLINICAL DECISION MAKING: Evolving/moderate complexity  EVALUATION COMPLEXITY: Moderate   GOALS:  SHORT TERM GOALS:  Pt will tolerate aquatic therapy without adverse effects for improved core strength, pain, function, and tolerance to activity.  Baseline: Goal status:MET - 06/08/23 Target date:  06/04/2023  2.  Pt will demonstrate improved soft tissue tightness in L sided lumbar paraspinals  and reduced tenderness in glute and piriformis for improved pain, tension, mobility, and ease with daily activities.  Baseline:  Goal status: NOT MET  8/2 Target date:  06/18/2023   3.  Pt will report at least a 25% improvement in pain and sx's overall.  Baseline:  Goal status: Met 06/16/23 Target date:  06/11/2023  4.  Pt will progress with aquatic exercises without adverse effects for improved core strength,  mobility and improved tolerance with daily activities and work activities Baseline:  Goal status: Met 06/16/23    LONG TERM GOALS: Target date: 08/06/2023  Pt will be able to ambulate her normal community distance and perform her normal work activities without her L LE wanting to give way.  Baseline:  Goal status: NOT MET   8/2  2.  Pt will report at least a 70% improvement in pain with work activities.  Baseline:  Goal status:  85% MET   3.  Pt will demo proper body mechanics with hip hinge/squatting to lift instead of bending to reduce lumbar stress and strain with functional activities at home and at work.  Baseline:  Goal status: ONGOING--NOT ASSESSED  4.  Pt will be independent with aquatic HEP for improved core strength, pain, tolerance to activity, and function.   Baseline:  Goal status:  PROGRESSING 8/2.  Modified goal  5.  Pt will be able to perform her ADLs and IADLs without significant pain and difficulty.  Baseline:  Goal status: PROGRESSING  8/2    PLAN:  PT FREQUENCY: 1x/week  PT DURATION: 2-4 weeks  PLANNED INTERVENTIONS: Therapeutic exercises, Therapeutic activity, Neuromuscular re-education, Balance training, Gait training, Patient/Family education, Self Care, Joint mobilization, Stair training, Aquatic Therapy, Dry Needling, Electrical stimulation, Spinal mobilization, Cryotherapy, Moist heat, Taping, Ultrasound, Manual therapy, and Re-evaluation.  PLAN FOR NEXT SESSION: Cont with aquatic therapy for 2-4 visits.  Establish independence with aquatic program.  Pt's surgery is scheduled for 9/3.   Audie Clear III PT, DPT 07/03/23 12:38 PM

## 2023-07-14 ENCOUNTER — Ambulatory Visit: Payer: Commercial Managed Care - HMO | Admitting: Sports Medicine

## 2023-07-14 ENCOUNTER — Encounter: Payer: Self-pay | Admitting: Sports Medicine

## 2023-07-14 DIAGNOSIS — M1811 Unilateral primary osteoarthritis of first carpometacarpal joint, right hand: Secondary | ICD-10-CM

## 2023-07-14 DIAGNOSIS — G5603 Carpal tunnel syndrome, bilateral upper limbs: Secondary | ICD-10-CM

## 2023-07-14 MED ORDER — BETAMETHASONE SOD PHOS & ACET 6 (3-3) MG/ML IJ SUSP
6.0000 mg | INTRAMUSCULAR | Status: AC | PRN
Start: 2023-07-14 — End: 2023-07-14
  Administered 2023-07-14: 6 mg via INTRA_ARTICULAR

## 2023-07-14 MED ORDER — LIDOCAINE HCL 1 % IJ SOLN
0.5000 mL | INTRAMUSCULAR | Status: AC | PRN
Start: 2023-07-14 — End: 2023-07-14
  Administered 2023-07-14: .5 mL

## 2023-07-14 MED ORDER — METHYLPREDNISOLONE 4 MG PO TBPK: ORAL_TABLET | ORAL | 0 refills | Status: DC

## 2023-07-14 NOTE — Progress Notes (Signed)
Kim Watson - 52 y.o. female MRN [id number]  Date of birth: Aug 08, 1971  Office Visit Note: Visit Date: 07/14/2023 PCP: Gerre Scull, NP Referred by: Gerre Scull, NP  Subjective: Chief Complaint  Patient presents with   Right Hand - Pain, Numbness    C/o numbness in the 3rd and 4th digits, pain in CMC joints, Left worse than Right   Left Hand - Pain, Numbness    C/o numbness in the 3rd and 4th digits, pain in CMC joints, Left worse than Right   HPI: Kim Watson is a pleasant 52 y.o. female who presents today for right thumb CMC pain, bilateral carpal tunnel.  Carpal tunnel - had great relief post ultrasound-guided injection bilaterally 03/10/2023.  Remains in a cock-up wrist braces at nighttime.  Continues on Lyrica 75 mg twice daily.  The left carpal tunnel has bothered her more than the right.  Getting numbness and tingling in the third and fourth digit.  Will have to shake it out which does improve.  CMC right thumb -had a one-time injection back in February which gave her excellent relief until the last few weeks.  Your last 2 weeks gripping and using this hand has become painful at the thumb.  Denies any inciting event or injury.  Does note some swelling around the thumb.  Does have a cool comfort brace but lost this initially but did just find it yesterday.   Pertinent ROS were reviewed with the patient and found to be negative unless otherwise specified above in HPI.   Assessment & Plan: Visit Diagnoses:  1. Osteoarthritis of carpometacarpal (CMC) joint of right thumb, unspecified osteoarthritis type   2. Carpal tunnel syndrome, bilateral    Plan: Discussed with Kim Watson that she is dealing with an exacerbation of her right CMC thumb osteoarthritis.  She does have a cool comfort brace that she lost which did just fine yesterday, I would like her to resume this.  Through shared decision-making, did elect to proceed with [facility] joint CS injection, tolerated well.  May use  ice or over-the-counter anti-inflammatory/Tylenol as well.  She does have moderate carpal tunnel based on symptoms and with nerve conduction study confirmation, the left is bothering her more than the right side.  She does have her cock-up wrist braces which she will continue at nighttime.  We will start her on a 6-day methylprednisolone taper course and attempt to settle this down.  I will have her follow-up in about 3 weeks for reevaluation.  If her symptoms are not significantly improved, we could always consider an ultrasound-guided carpal tunnel injection on the left.  She will continue her Lyrica 75 mg twice daily.  Follow-up: Return for f/u 2.5-3 weeks for carpal tunnel poss US-inj.   Meds & Orders:  Meds ordered this encounter  Medications   methylPREDNISolone (MEDROL DOSEPAK) 4 MG TBPK tablet    Sig: Take per packet instructions. Taper dosing.    Dispense:  1 each    Refill:  0    Orders Placed This Encounter  Procedures   Small Joint Inj     Procedures: Small Joint Inj on 07/14/2023 9:30 AM Indications: pain Details: 25 G needle, radial approach Medications: 0.5 mL lidocaine 1 %; 6 mg betamethasone acetate-betamethasone sodium phosphate 6 (3-3) MG/ML Outcome: tolerated well, no immediate complications Procedure, treatment alternatives, risks and benefits explained, specific risks discussed. Consent was given by the patient. Immediately prior to procedure a time out was called to verify the  correct patient, procedure, equipment, support staff and site/side marked as required. Patient was prepped and draped in the usual sterile fashion.          Clinical History: No specialty comments available.  She reports that she has never smoked. She has never used smokeless tobacco.  Recent Labs    07/14/22 1345 12/12/22 1527  HGBA1C 5.8 5.5    Objective:   Vital Signs: There were no vitals taken for this visit.  Physical Exam  Gen: Well-appearing, in no acute distress;  non-toxic CV: Well-perfused. Warm.  Resp: Breathing unlabored on room air; no wheezing. Psych: Fluid speech in conversation; appropriate affect; normal thought process Neuro: Sensation intact throughout. No gross coordination deficits.   Ortho Exam - Right thumb/hand: There is some mild swelling without redness or warmth over the right thumb and CMC joint.  Positive CMC grind test.  Okay sign intact.  Neurovascular intact distally.  - Left wrist/hand: Full range of motion with flexion and extension.  Negative Tinel's test at the wrist today.  Pain reproducible with prolonged Durkan's.  Imaging:  XR 12/23/22:  3 views of the right hand including AP, oblique and lateral femoral  ordered and reviewed by myself.  X-rays demonstrate moderate to severe  osteoarthritic change with joint space narrowing at the [facility] joint of the  thumb, there is some sclerosis noted on the ulnar side of the joint.  Some  mild arthritic change throughout the carpal row, although no significant  bony abnormality or fracture.   3 views of the left hand including AP, oblique and lateral film were  ordered and reviewed by myself.  X-rays demonstrate moderate arthritic  change of the [facility] joint.  Otherwise no acute bony fracture or bony  abnormality noted.   Past Medical/Family/Surgical/Social History: Medications & Allergies reviewed per EMR, new medications updated. Patient Active Problem List   Diagnosis Date Noted   Bronchitis 05/15/2023   Back pain 04/14/2023   Symptomatic mammary hypertrophy 04/14/2023   Bilateral carpal tunnel syndrome 12/12/2022   Tendonitis 12/12/2022   Cervical myelopathy (HCC) 10/02/2022   Preop examination 09/16/2022   COVID-19 08/20/2022   Acute nonintractable headache 08/20/2022   Pain in right shoulder 06/27/2022   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/07/2022   Primary hypertension 05/07/2022   Neck pain 05/07/2022   Acute left-sided low back pain without sciatica 02/24/2022   Bursitis of  left shoulder 01/10/2022   Dry mouth 01/10/2022   History of bariatric surgery 01/10/2022   Elevated blood pressure reading 01/10/2022   Anxiety and depression 01/10/2022   Gastroesophageal reflux disease 01/10/2022   Chronic migraine without aura without status migrainosus, not intractable 08/31/2018   Prediabetes 07/13/2017   At risk for diabetes mellitus 07/02/2017   Insulin resistance 07/02/2017   Vitamin D deficiency 07/02/2017   Other hyperlipidemia 07/02/2017   Morbid obesity (HCC) 06/18/2017   Chronic pain of both shoulders 11/02/2014   Arthritis of both knees 10/04/2014   Past Medical History:  Diagnosis Date   Anxiety    Arthritis    Depression    Gallbladder problem    GERD (gastroesophageal reflux disease)    High blood pressure    Hypertension    Migraine    Obesity    Obstructive sleep apnea    2016 was cleared from having to use CPAP at night. Gastric bypass surgery 2015.   Vitamin D deficiency    Family History  Problem Relation Age of Onset   Diabetes Maternal Grandmother  Heart disease Maternal Grandmother    Diabetes Other    Hyperlipidemia Other    Hypertension Other    BRCA 1/2 Neg Hx    Past Surgical History:  Procedure Laterality Date   ANTERIOR CERVICAL DECOMP/DISCECTOMY FUSION N/A 10/02/2022   Procedure: ANTERIOR CERVICAL DISCECTOMY AND FUSION CERVICAL FOUR TO SEVEN;  Surgeon: Venita Lick, MD;  Location: [facility] OR;  Service: Orthopedics;  Laterality: N/A;  4hrs 3 C-Bed   CHOLECYSTECTOMY     FOOT SURGERY Left 07/26/2022   5th metatarsal   SLEEVE GASTROPLASTY     TUBAL LIGATION     Social History   Occupational History   Occupation: Waitress  Tobacco Use   Smoking status: Never   Smokeless tobacco: Never  Vaping Use   Vaping status: Never Used  Substance and Sexual Activity   Alcohol use: Not Currently    Comment: socially   Drug use: No   Sexual activity: Yes    Birth control/protection: Surgical

## 2023-07-16 ENCOUNTER — Other Ambulatory Visit: Payer: Self-pay | Admitting: Nurse Practitioner

## 2023-07-17 NOTE — Telephone Encounter (Signed)
Requesting: LOSARTAN/HCTZ 50/12.5MG  TABLETS  Last Visit: 05/15/2023 Next Visit: Visit date not found Last Refill: 01/06/2023  Please Advise

## 2023-07-22 ENCOUNTER — Encounter: Payer: Self-pay | Admitting: Nurse Practitioner

## 2023-07-28 ENCOUNTER — Encounter: Payer: Self-pay | Admitting: Sports Medicine

## 2023-07-28 ENCOUNTER — Ambulatory Visit (INDEPENDENT_AMBULATORY_CARE_PROVIDER_SITE_OTHER): Payer: Commercial Managed Care - HMO | Admitting: Sports Medicine

## 2023-07-28 ENCOUNTER — Other Ambulatory Visit: Payer: Self-pay

## 2023-07-28 DIAGNOSIS — M1811 Unilateral primary osteoarthritis of first carpometacarpal joint, right hand: Secondary | ICD-10-CM

## 2023-07-28 DIAGNOSIS — M79642 Pain in left hand: Secondary | ICD-10-CM | POA: Diagnosis not present

## 2023-07-28 DIAGNOSIS — G5603 Carpal tunnel syndrome, bilateral upper limbs: Secondary | ICD-10-CM | POA: Diagnosis not present

## 2023-07-28 DIAGNOSIS — Z981 Arthrodesis status: Secondary | ICD-10-CM

## 2023-07-28 DIAGNOSIS — G5602 Carpal tunnel syndrome, left upper limb: Secondary | ICD-10-CM | POA: Diagnosis not present

## 2023-07-28 NOTE — Progress Notes (Signed)
Bilateral hand pain Requesting injection Referral to Dr. Fara Boros for potential surgery first of the year

## 2023-07-28 NOTE — Progress Notes (Signed)
Kim Watson - 52 y.o. female MRN [id number]  Date of birth: 12-25-1970  Office Visit Note: Visit Date: 07/28/2023 PCP: Gerre Scull, NP Referred by: Gerre Scull, NP  Subjective: Chief Complaint  Patient presents with   Left Hand - Numbness   Right Hand - Numbness   HPI: Kim Watson is a pleasant 52 y.o. female who presents today for bilateral carpal tunnel syndrome.  L > R.   Back on 07/14/23 did do a 6-day prednisone taper - this certainly helped her symptoms, but left is still bothersome. Right essentially nearly resolved itself with this medicine. She is interested in discussing possible surgery for carpal tunnel release. She is managed on Gabapentin 300mg  BID and Cymbalta 60mg  every day which does help control symptoms, but not take them away. Having some mild swelling in hands, L > R.   Pertinent ROS were reviewed with the patient and found to be negative unless otherwise specified above in HPI.   Assessment & Plan: Visit Diagnoses:  1. Carpal tunnel syndrome, bilateral   2. Pain in left hand   3. Osteoarthritis of carpometacarpal (CMC) joint of right thumb, unspecified osteoarthritis type   4. S/P cervical spinal fusion     Plan: Had a discussion with Kim Watson regarding her bilateral carpal tunnel as well as her [facility] joint arthritis of the thumbs.  She has had some relief of her symptoms with nighttime bracing, gabapentin and very infrequent oral prednisone.  She did respond well in the past with carpal tunnel injection which was back about 5 months ago.  We did proceed through shared decision making with ultrasound-guided left carpal tunnel injection, patient tolerated well.  Her nerve does appear more swollen today on ultrasound compared to ultrasound examination in the past.  Given this and her persisting symptoms, she is interested in having discussion about carpal tunnel release surgery.  We will have her follow-up with Dr. Michae Kava to have this discussion.  In  the meantime she will continue her gabapentin 300 mg twice daily and her Cymbalta 60 mg daily.  Recommended ice for the carpal tunnel inlet/outlet as well as her CMC joint.  Continue nighttime splinting.  She will follow-up with me as needed, her right carpal tunnel was doing okay today, did discuss we could always consider bridging the gap before her surgery with a injection in this area but would need to wait 3 months before planned surgical intervention, she is aware.  Follow-up: Return for Make appt with Dr. Michae Kava [facility]) for discussion on b/l carpal tunnel.   Meds & Orders: No orders of the defined types were placed in this encounter.   Orders Placed This Encounter  Procedures   US Guided Needle Placement - No Linked Charges     Procedures: Procedure: US-guided Carpal Tunnel Injection, Left Wrist After informed verbal consent and discussion on R/B/I, a timeout was performed, patient was seated on exam table with the affected arm placed in supine position on table. The area overlying the patient's carpal tunnel was prepped with Betadine and alcohol swabs then utilizing ultrasound guidance via an in-plane approach, the patient's carpal tunnel was injected with a mixture of 1:1:1:1 lidocaine:bupivicaine:D5W:betamethasone with hydrodissection of the median nerve from the flexor retinaculum in 360 degree fashion. Visualization of the needle was achieved with a transverse, in-plane approach. Patient tolerated procedure well without immediate complications.       Clinical History: No specialty comments available.  She reports that she has never smoked. She  has never used smokeless tobacco.  Recent Labs    12/12/22 1527  HGBA1C 5.5   EMG & NCV Findings: Evaluation of the left median motor and the right median motor nerves showed prolonged distal onset latency (L5.0, R5.3 ms) and decreased conduction velocity (Elbow-Wrist, L49, R46 m/s).  The left median (across palm) sensory nerve showed no  response (Palm) and prolonged distal peak latency (5.4 ms).  The right median (across palm) sensory nerve showed prolonged distal peak latency (Wrist, 5.0 ms) and prolonged distal peak latency (Palm, 5.6 ms).  All remaining nerves (as indicated in the following tables) were within normal limits.  All left vs. right side differences were within normal limits.     All examined muscles (as indicated in the following table) showed no evidence of electrical instability.     Impression: The above electrodiagnostic study is ABNORMAL and reveals evidence of a moderate bilateral median nerve entrapment at the wrist (carpal tunnel syndrome) affecting sensory and motor components.    There is no significant electrodiagnostic evidence of any other focal nerve entrapment, brachial plexopathy or cervical radiculopathy.   Objective:   Vital Signs: There were no vitals taken for this visit.  Physical Exam  Gen: Well-appearing, in no acute distress; non-toxic CV: Well-perfused. Warm.  Resp: Breathing unlabored on room air; no wheezing. Psych: Fluid speech in conversation; appropriate affect; normal thought process Neuro: Sensation intact throughout. No gross coordination deficits.   Ortho Exam - Bilateral hands/wrists: There is some mild swelling in the ring and middle finger bilaterally without redness or warmth.  Positive CMC grind test on the right but mild.  Okay sign intact.  Positive Tinel's and reproduction of symptoms with Durkan's test on the left.  Full range of motion of bilateral wrists.  Imaging: No results found.  Past Medical/Family/Surgical/Social History: Medications & Allergies reviewed per EMR, new medications updated. Patient Active Problem List   Diagnosis Date Noted   Bronchitis 05/15/2023   Back pain 04/14/2023   Symptomatic mammary hypertrophy 04/14/2023   Bilateral carpal tunnel syndrome 12/12/2022   Tendonitis 12/12/2022   Cervical myelopathy (HCC) 10/02/2022   Preop  examination 09/16/2022   COVID-19 08/20/2022   Acute nonintractable headache 08/20/2022   Pain in right shoulder 06/27/2022   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/07/2022   Primary hypertension 05/07/2022   Neck pain 05/07/2022   Acute left-sided low back pain without sciatica 02/24/2022   Bursitis of left shoulder 01/10/2022   Dry mouth 01/10/2022   History of bariatric surgery 01/10/2022   Elevated blood pressure reading 01/10/2022   Anxiety and depression 01/10/2022   Gastroesophageal reflux disease 01/10/2022   Chronic migraine without aura without status migrainosus, not intractable 08/31/2018   Prediabetes 07/13/2017   At risk for diabetes mellitus 07/02/2017   Insulin resistance 07/02/2017   Vitamin D deficiency 07/02/2017   Other hyperlipidemia 07/02/2017   Morbid obesity (HCC) 06/18/2017   Chronic pain of both shoulders 11/02/2014   Arthritis of both knees 10/04/2014   Past Medical History:  Diagnosis Date   Anxiety    Arthritis    Depression    Gallbladder problem    GERD (gastroesophageal reflux disease)    High blood pressure    Hypertension    Migraine    Obesity    Obstructive sleep apnea    2016 was cleared from having to use CPAP at night. Gastric bypass surgery 2015.   Vitamin D deficiency    Family History  Problem Relation Age of Onset  Diabetes Maternal Grandmother    Heart disease Maternal Grandmother    Diabetes Other    Hyperlipidemia Other    Hypertension Other    BRCA 1/2 Neg Hx    Past Surgical History:  Procedure Laterality Date   ANTERIOR CERVICAL DECOMP/DISCECTOMY FUSION N/A 10/02/2022   Procedure: ANTERIOR CERVICAL DISCECTOMY AND FUSION CERVICAL FOUR TO SEVEN;  Surgeon: Venita Lick, MD;  Location: [facility] OR;  Service: Orthopedics;  Laterality: N/A;  4hrs 3 C-Bed   CHOLECYSTECTOMY     FOOT SURGERY Left 07/26/2022   5th metatarsal   SLEEVE GASTROPLASTY     TUBAL LIGATION     Social History   Occupational History   Occupation: Waitress   Tobacco Use   Smoking status: Never   Smokeless tobacco: Never  Vaping Use   Vaping status: Never Used  Substance and Sexual Activity   Alcohol use: Not Currently    Comment: socially   Drug use: No   Sexual activity: Yes    Birth control/protection: Surgical

## 2023-08-06 ENCOUNTER — Telehealth: Payer: Self-pay | Admitting: Nurse Practitioner

## 2023-08-06 NOTE — Telephone Encounter (Signed)
9.5.24 - Pt called wanting to get the telephone numbers of the dermatologist and weight management office she was referred to, as it's been over 2 months she's been referred to them and never got a call from them.

## 2023-08-07 NOTE — Telephone Encounter (Signed)
I called and spoke with patient and she said that she received the phone numbers yesterday.

## 2023-08-12 ENCOUNTER — Other Ambulatory Visit: Payer: Self-pay | Admitting: Nurse Practitioner

## 2023-08-17 NOTE — Progress Notes (Deleted)
Established Patient Office Visit  Subjective   Patient ID: Kim Watson, female    DOB: September 12, 1971  Age: 52 y.o. MRN: [id number]  No chief complaint on file.   HPI  RAELY HIBBERT is here to follow-up on anxiety and hot flashes. She has also noticed some blisters on her legs.   {History (Optional):23778}  ROS    Objective:     There were no vitals taken for this visit. {Vitals History (Optional):23777}  Physical Exam   No results found for any visits on 08/18/23.  {Labs (Optional):23779}  The 10-year ASCVD risk score (Arnett DK, et al., 2019) is: 2%    Assessment & Plan:   Problem List Items Addressed This Visit   None   No follow-ups on file.    Gerre Scull, NP

## 2023-08-18 ENCOUNTER — Ambulatory Visit: Payer: Commercial Managed Care - HMO | Admitting: Nurse Practitioner

## 2023-08-18 ENCOUNTER — Telehealth: Payer: Self-pay | Admitting: Nurse Practitioner

## 2023-08-18 NOTE — Telephone Encounter (Signed)
No show / no show letter sent ( 9.17.24 pt called today @ 10:21 to cancel . pt stated she fell and had to go to the dr office . )

## 2023-08-18 NOTE — Telephone Encounter (Signed)
Noted  

## 2023-08-25 ENCOUNTER — Other Ambulatory Visit: Payer: Self-pay | Admitting: Nurse Practitioner

## 2023-09-10 ENCOUNTER — Encounter: Payer: Self-pay | Admitting: Nurse Practitioner

## 2023-09-10 ENCOUNTER — Ambulatory Visit: Payer: Commercial Managed Care - HMO | Admitting: Nurse Practitioner

## 2023-09-10 VITALS — BP 128/82 | HR 53 | Temp 97.2°F | Ht 66.0 in | Wt 283.2 lb

## 2023-09-10 DIAGNOSIS — F419 Anxiety disorder, unspecified: Secondary | ICD-10-CM

## 2023-09-10 DIAGNOSIS — F5101 Primary insomnia: Secondary | ICD-10-CM | POA: Diagnosis not present

## 2023-09-10 DIAGNOSIS — E559 Vitamin D deficiency, unspecified: Secondary | ICD-10-CM

## 2023-09-10 DIAGNOSIS — R232 Flushing: Secondary | ICD-10-CM

## 2023-09-10 DIAGNOSIS — F32A Depression, unspecified: Secondary | ICD-10-CM

## 2023-09-10 DIAGNOSIS — L821 Other seborrheic keratosis: Secondary | ICD-10-CM

## 2023-09-10 LAB — CBC WITH DIFFERENTIAL/PLATELET
Basophils Absolute: 0 10*3/uL (ref 0.0–0.1)
Basophils Relative: 0.6 % (ref 0.0–3.0)
Eosinophils Absolute: 0.2 10*3/uL (ref 0.0–0.7)
Eosinophils Relative: 3.2 % (ref 0.0–5.0)
HCT: 43 % (ref 36.0–46.0)
Hemoglobin: 13.7 g/dL (ref 12.0–15.0)
Lymphocytes Relative: 41.6 % (ref 12.0–46.0)
Lymphs Abs: 2.6 10*3/uL (ref 0.7–4.0)
MCHC: 31.9 g/dL (ref 30.0–36.0)
MCV: 85.2 fL (ref 78.0–100.0)
Monocytes Absolute: 0.4 10*3/uL (ref 0.1–1.0)
Monocytes Relative: 7.2 % (ref 3.0–12.0)
Neutro Abs: 2.9 10*3/uL (ref 1.4–7.7)
Neutrophils Relative %: 47.4 % (ref 43.0–77.0)
Platelets: 248 10*3/uL (ref 150.0–400.0)
RBC: 5.05 Mil/uL (ref 3.87–5.11)
RDW: 15.1 % (ref 11.5–15.5)
WBC: 6.2 10*3/uL (ref 4.0–10.5)

## 2023-09-10 LAB — COMPREHENSIVE METABOLIC PANEL
ALT: 44 U/L — ABNORMAL HIGH (ref 0–35)
AST: 26 U/L (ref 0–37)
Albumin: 4.1 g/dL (ref 3.5–5.2)
Alkaline Phosphatase: 71 U/L (ref 39–117)
BUN: 21 mg/dL (ref 6–23)
CO2: 26 meq/L (ref 19–32)
Calcium: 9.2 mg/dL (ref 8.4–10.5)
Chloride: 106 meq/L (ref 96–112)
Creatinine, Ser: 0.78 mg/dL (ref 0.40–1.20)
GFR: 87.55 mL/min (ref >60.00)
Glucose, Bld: 103 mg/dL — ABNORMAL HIGH (ref 70–99)
Potassium: 3.9 meq/L (ref 3.5–5.1)
Sodium: 141 meq/L (ref 135–145)
Total Bilirubin: 0.4 mg/dL (ref 0.2–1.2)
Total Protein: 6.2 g/dL (ref 6.0–8.3)

## 2023-09-10 LAB — TSH: TSH: 2.93 u[IU]/mL (ref 0.35–5.50)

## 2023-09-10 LAB — VITAMIN D 25 HYDROXY (VIT D DEFICIENCY, FRACTURES): VITD: 28.86 ng/mL — ABNORMAL LOW (ref 30.00–100.00)

## 2023-09-10 MED ORDER — TRAZODONE HCL 50 MG PO TABS: 25.0000 mg | ORAL_TABLET | Freq: Every evening | ORAL | 1 refills | Status: DC | PRN

## 2023-09-10 MED ORDER — VENLAFAXINE HCL ER 75 MG PO CP24: 75.0000 mg | ORAL_CAPSULE | Freq: Every day | ORAL | 1 refills | Status: DC

## 2023-09-10 NOTE — Progress Notes (Signed)
Established Patient Office Visit  Subjective   Patient ID: Kim Watson, female    DOB: 06/19/1971  Age: 52 y.o. MRN: [id number]  Chief Complaint  Patient presents with   Anxiety and Sleep issues    Anxiety and stress for weeks, check skin on left side of face    HPI  Kim Watson is here to follow-up on anxiety and depression.  She states that her anxiety has worsened significantly in the last month.  She is having mood swings, feeling like she gets irritable easily and has been having crying spells.  She is also having trouble sleeping and has been getting only about 4 hours of sleep every night.  She has been taking her Cymbalta daily.  She states that she is also having hot flashes that go from her chest up to the top of her head with sweating.  She states that her last menstrual period was over a year ago.  She also notes that she has a spot on the side of her left cheek.  She cannot see it and does not remember this being there before.    ROS See pertinent positives and negatives per HPI.    Objective:     BP 128/82 (BP Location: Left Arm)   Pulse (!) 53   Temp (!) 97.2 F (36.2 C)   Ht 5\' 6"  (1.676 m)   Wt 283 lb 3.2 oz (128.5 kg)   SpO2 99%   BMI 45.71 kg/m  BP Readings from Last 3 Encounters:  09/10/23 128/82  05/15/23 134/76  04/14/23 (!) 149/88   Wt Readings from Last 3 Encounters:  09/10/23 283 lb 3.2 oz (128.5 kg)  05/15/23 280 lb 6.4 oz (127.2 kg)  04/14/23 281 lb 9.6 oz (127.7 kg)      Physical Exam Vitals and nursing note reviewed.  Constitutional:      General: She is not in acute distress.    Appearance: Normal appearance.  HENT:     Head: Normocephalic.  Eyes:     Conjunctiva/sclera: Conjunctivae normal.  Cardiovascular:     Rate and Rhythm: Normal rate and regular rhythm.     Pulses: Normal pulses.     Heart sounds: Normal heart sounds.  Pulmonary:     Effort: Pulmonary effort is normal.     Breath sounds: Normal breath sounds.   Musculoskeletal:     Cervical back: Normal range of motion.  Skin:    General: Skin is warm.     Comments: Small, flat nevi to left cheek  Neurological:     General: No focal deficit present.     Mental Status: She is alert and oriented to person, place, and time.  Psychiatric:        Mood and Affect: Mood normal. Affect is tearful.        Behavior: Behavior normal.        Thought Content: Thought content normal.        Judgment: Judgment normal.    The 10-year ASCVD risk score (Arnett DK, et al., 2019) is: 2.4%    Assessment & Plan:   Problem List Items Addressed This Visit       Cardiovascular and Mediastinum   Hot flashes    She has been experiencing hot flashes for the last month.  She states her last menstrual period was over a year ago.  Will check FSH, LH, estradiol today.  This is most likely from menopausal symptoms.  We are switching  her Cymbalta to Effexor 75 mg daily as noted below.  Follow-up in 4 weeks.      Relevant Orders   CBC with Differential/Platelet   Comprehensive metabolic panel   TSH   FSH/LH   Estradiol     Other   Vitamin D deficiency    Check vitamin D levels and treat based on results      Relevant Orders   VITAMIN D 25 Hydroxy (Vit-D Deficiency, Fractures)   Anxiety and depression - Primary    Chronic, not controlled.  Her anxiety and depression has worsened over the last 4 to 6 weeks.  She is having crying spells, frequent panic attacks, and trouble sleeping.  Will switch her Cymbalta to venlafaxine 75 mg daily.  She can continue Klonopin 0.5 mg twice a day as needed.  She denies SI/HI.  Check CMP, CBC, TSH today. Follow-up in 4 to 6 weeks.      Relevant Medications   venlafaxine XR (EFFEXOR XR) 75 MG 24 hr capsule   traZODone (DESYREL) 50 MG tablet   Other Relevant Orders   Ambulatory referral to Psychology   Primary insomnia    Chronic, ongoing.  She has been having a hard time sleeping up for the last month.  She had tried  melatonin in the past, however this caused her to have nightmares.  Will have her start trazodone 25 to 50 mg daily at bedtime as needed.      Other Visit Diagnoses     Seborrheic keratosis       Noted to left cheek.  Reassured patient       Return in about 4 weeks (around 10/08/2023) for Depression, Anxiety.    Gerre Scull, NP

## 2023-09-10 NOTE — Patient Instructions (Signed)
It was great to see you!  Stop the cymbalta and start effexor once a day  Start trazodone 1 tablet daily at bedtime.   I have a placed a referral to a therapist.   Let's follow-up in 4-6 weeks, sooner if you have concerns.  If a referral was placed today, you will be contacted for an appointment. Please note that routine referrals can sometimes take up to 3-4 weeks to process. Please call our office if you haven't heard anything after this time frame.  Take care,  Rodman Pickle, NP

## 2023-09-10 NOTE — Assessment & Plan Note (Signed)
Chronic, ongoing.  She has been having a hard time sleeping up for the last month.  She had tried melatonin in the past, however this caused her to have nightmares.  Will have her start trazodone 25 to 50 mg daily at bedtime as needed.

## 2023-09-10 NOTE — Assessment & Plan Note (Addendum)
Chronic, not controlled.  Her anxiety and depression has worsened over the last 4 to 6 weeks.  She is having crying spells, frequent panic attacks, and trouble sleeping.  Will switch her Cymbalta to venlafaxine 75 mg daily.  She can continue Klonopin 0.5 mg twice a day as needed.  She denies SI/HI.  Check CMP, CBC, TSH today. Follow-up in 4 to 6 weeks.

## 2023-09-10 NOTE — Assessment & Plan Note (Signed)
Check vitamin D levels and treat based on results.

## 2023-09-10 NOTE — Assessment & Plan Note (Signed)
She has been experiencing hot flashes for the last month.  She states her last menstrual period was over a year ago.  Will check FSH, LH, estradiol today.  This is most likely from menopausal symptoms.  We are switching her Cymbalta to Effexor 75 mg daily as noted below.  Follow-up in 4 weeks.

## 2023-09-11 ENCOUNTER — Encounter: Payer: Self-pay | Admitting: Nurse Practitioner

## 2023-09-11 LAB — ESTRADIOL: Estradiol: <15 pg/mL

## 2023-09-11 LAB — FSH/LH
FSH: 57.1 m[IU]/mL
LH: 33.7 m[IU]/mL

## 2023-09-17 ENCOUNTER — Encounter: Payer: Self-pay | Admitting: Nurse Practitioner

## 2023-09-22 ENCOUNTER — Encounter: Payer: Self-pay | Admitting: Nurse Practitioner

## 2023-09-22 ENCOUNTER — Ambulatory Visit (INDEPENDENT_AMBULATORY_CARE_PROVIDER_SITE_OTHER): Payer: Managed Care, Other (non HMO) | Admitting: Nurse Practitioner

## 2023-09-22 VITALS — BP 138/90 | HR 76 | Temp 97.3°F | Ht 66.0 in | Wt 282.4 lb

## 2023-09-22 DIAGNOSIS — Z01818 Encounter for other preprocedural examination: Secondary | ICD-10-CM

## 2023-09-22 DIAGNOSIS — M5416 Radiculopathy, lumbar region: Secondary | ICD-10-CM | POA: Insufficient documentation

## 2023-09-22 LAB — MAGNESIUM: Magnesium: 1.9 mg/dL (ref 1.5–2.5)

## 2023-09-22 LAB — FOLATE: Folate: 19.2 ng/mL (ref >5.9)

## 2023-09-22 LAB — CBC WITH DIFFERENTIAL/PLATELET
Basophils Absolute: 0 10*3/uL (ref 0.0–0.1)
Basophils Relative: 0.4 % (ref 0.0–3.0)
Eosinophils Absolute: 0.1 10*3/uL (ref 0.0–0.7)
Eosinophils Relative: 2.6 % (ref 0.0–5.0)
HCT: 42.3 % (ref 36.0–46.0)
Hemoglobin: 13.5 g/dL (ref 12.0–15.0)
Lymphocytes Relative: 38.2 % (ref 12.0–46.0)
Lymphs Abs: 1.7 10*3/uL (ref 0.7–4.0)
MCHC: 31.8 g/dL (ref 30.0–36.0)
MCV: 85.8 fL (ref 78.0–100.0)
Monocytes Absolute: 0.4 10*3/uL (ref 0.1–1.0)
Monocytes Relative: 9.1 % (ref 3.0–12.0)
Neutro Abs: 2.2 10*3/uL (ref 1.4–7.7)
Neutrophils Relative %: 49.7 % (ref 43.0–77.0)
Platelets: 226 10*3/uL (ref 150.0–400.0)
RBC: 4.93 Mil/uL (ref 3.87–5.11)
RDW: 15.5 % (ref 11.5–15.5)
WBC: 4.4 10*3/uL (ref 4.0–10.5)

## 2023-09-22 LAB — COMPREHENSIVE METABOLIC PANEL
ALT: 44 U/L — ABNORMAL HIGH (ref 0–35)
AST: 33 U/L (ref 0–37)
Albumin: 4 g/dL (ref 3.5–5.2)
Alkaline Phosphatase: 69 U/L (ref 39–117)
BUN: 10 mg/dL (ref 6–23)
CO2: 27 meq/L (ref 19–32)
Calcium: 9.4 mg/dL (ref 8.4–10.5)
Chloride: 103 meq/L (ref 96–112)
Creatinine, Ser: 0.71 mg/dL (ref 0.40–1.20)
GFR: 97.98 mL/min (ref >60.00)
Glucose, Bld: 106 mg/dL — ABNORMAL HIGH (ref 70–99)
Potassium: 4 meq/L (ref 3.5–5.1)
Sodium: 139 meq/L (ref 135–145)
Total Bilirubin: 0.5 mg/dL (ref 0.2–1.2)
Total Protein: 6.7 g/dL (ref 6.0–8.3)

## 2023-09-22 LAB — T4, FREE: Free T4: 0.62 ng/dL (ref 0.60–1.60)

## 2023-09-22 LAB — LIPID PANEL
Cholesterol: 214 mg/dL — ABNORMAL HIGH (ref 0–200)
HDL: 43.4 mg/dL (ref >39.00)
LDL Cholesterol: 117 mg/dL — ABNORMAL HIGH (ref 0–99)
NonHDL: 170.97
Total CHOL/HDL Ratio: 5
Triglycerides: 272 mg/dL — ABNORMAL HIGH (ref 0.0–149.0)
VLDL: 54.4 mg/dL — ABNORMAL HIGH (ref 0.0–40.0)

## 2023-09-22 LAB — VITAMIN B12: Vitamin B-12: 562 pg/mL (ref 211–911)

## 2023-09-22 LAB — TSH: TSH: 2.17 u[IU]/mL (ref 0.35–5.50)

## 2023-09-22 LAB — HEMOGLOBIN A1C: Hgb A1c MFr Bld: 6 % (ref 4.6–6.5)

## 2023-09-22 LAB — PHOSPHORUS: Phosphorus: 4.2 mg/dL (ref 2.3–4.6)

## 2023-09-22 NOTE — Progress Notes (Signed)
EKG interpreted by me on 09/22/23 showed normal sinus rhythm with a heart rate of 70. No ST or T wave changes.

## 2023-09-22 NOTE — Progress Notes (Signed)
Established Patient Office Visit  Subjective   Patient ID: Kim Watson, female    DOB: 09-24-1971  Age: 52 y.o. MRN: [id number]  Chief Complaint  Patient presents with   Surgery Preparation    EKG and lab work    HPI  Discussed the use of AI scribe software for clinical note transcription with the patient, who gave verbal consent to proceed.  History of Present Illness   The patient, with a history of obesity and prior bariatric surgery, is preparing for a duodenal switch bariatric surgery. She has undergone various tests and consultations in preparation for the surgery, including an endoscopy scheduled for the 21st of November. She denies chest pain and shortness of breath. She has done preparation for surgery at [facility], but then found out her insurance won't cover surgery at that location. She is hoping to have the surgery in the next 2 months.        ROS See pertinent positives and negatives per HPI.    Objective:     BP (!) 138/90   Pulse 76   Temp (!) 97.3 F (36.3 C)   Ht 5\' 6"  (1.676 m)   Wt 282 lb 6.4 oz (128.1 kg)   SpO2 99%   BMI 45.58 kg/m    Physical Exam Vitals and nursing note reviewed.  Constitutional:      General: She is not in acute distress.    Appearance: Normal appearance.  HENT:     Head: Normocephalic.  Eyes:     Conjunctiva/sclera: Conjunctivae normal.  Cardiovascular:     Rate and Rhythm: Normal rate and regular rhythm.     Pulses: Normal pulses.     Heart sounds: Normal heart sounds.  Pulmonary:     Effort: Pulmonary effort is normal.     Breath sounds: Normal breath sounds.  Musculoskeletal:     Cervical back: Normal range of motion.  Skin:    General: Skin is warm.  Neurological:     General: No focal deficit present.     Mental Status: She is alert and oriented to person, place, and time.  Psychiatric:        Mood and Affect: Mood normal.        Behavior: Behavior normal.        Thought Content: Thought content  normal.        Judgment: Judgment normal.    The 10-year ASCVD risk score (Arnett DK, et al., 2019) is: 2.8%    Assessment & Plan:   Problem List Items Addressed This Visit       Other   Preop examination - Primary    She is preparing for a duodenal switch procedure and has an endoscopy scheduled for November 21st. We will complete the necessary blood work today and write a letter of medical clearance for the surgery.  She also picked up a copy of her records from [facility] with her original bariatric surgery.           Relevant Orders   Lactate dehydrogenase   Comprehensive metabolic panel   CBC with Differential/Platelet   Folate   Iron, TIBC and Ferritin Panel   Lipid panel   Vitamin A   Vitamin B1   Hemoglobin A1c   TSH   T4, free   PTH, Intact and Calcium   Vitamin B12   Zinc   Vitamin K1, Serum   Ceruloplasmin   Magnesium   Phosphorus   Nicotine screen, urine  EKG 12-Lead (Completed)    Return if symptoms worsen or fail to improve.    Gerre Scull, NP

## 2023-09-22 NOTE — Assessment & Plan Note (Addendum)
She is preparing for a duodenal switch procedure and has an endoscopy scheduled for November 21st. We will complete the necessary blood work today and write a letter of medical clearance for the surgery.  She also picked up a copy of her records from [facility] with her original bariatric surgery.

## 2023-09-22 NOTE — Patient Instructions (Signed)
It was great to see you!  We are checking your labs today and will let you know the results via mychart/phone.   Your EKG looks good  Let's follow-up with any concerns   Take care,  Rodman Pickle, NP

## 2023-09-23 ENCOUNTER — Ambulatory Visit: Payer: Commercial Managed Care - HMO | Admitting: Sports Medicine

## 2023-09-23 ENCOUNTER — Other Ambulatory Visit: Payer: Self-pay

## 2023-09-23 ENCOUNTER — Encounter: Payer: Self-pay | Admitting: Sports Medicine

## 2023-09-23 DIAGNOSIS — M79641 Pain in right hand: Secondary | ICD-10-CM

## 2023-09-23 DIAGNOSIS — G5603 Carpal tunnel syndrome, bilateral upper limbs: Secondary | ICD-10-CM

## 2023-09-23 DIAGNOSIS — M180 Bilateral primary osteoarthritis of first carpometacarpal joints: Secondary | ICD-10-CM | POA: Diagnosis not present

## 2023-09-23 DIAGNOSIS — M5451 Vertebrogenic low back pain: Secondary | ICD-10-CM | POA: Diagnosis not present

## 2023-09-23 LAB — LACTATE DEHYDROGENASE: LDH: 272 U/L — ABNORMAL HIGH (ref 120–250)

## 2023-09-23 LAB — SPECIMEN COMPROMISED

## 2023-09-23 NOTE — Progress Notes (Signed)
Kim Watson - 52 y.o. female MRN [id number]  Date of birth: 04-25-1971  Office Visit Note: Visit Date: 09/23/2023 PCP: Gerre Scull, NP Referred by: Gerre Scull, NP  Subjective: Chief Complaint  Patient presents with   Right Hand - Pain    Injection   HPI: Kim Watson is a pleasant 52 y.o. female who presents today for R > L carpal tunnel syndrome.   She is managed on Gabapentin 300mg  BID and Cymbalta 60mg  every day which does help control symptoms, but not take them away. Recently she has increased her Gabapentin to 600mg  BID due to her symptoms. We did inject her left carpal tunnel back in August and this is doing well still. She does wear her cock-up wrist braces at nighttime.   She is interested in carpal tunnel release surgery, but would like to do this at the start of the New Year of 2025.   Pertinent ROS were reviewed with the patient and found to be negative unless otherwise specified above in HPI.   Assessment & Plan: Visit Diagnoses:  1. Pain in right hand   2. Carpal tunnel syndrome, bilateral   3. Arthritis of carpometacarpal [facility]) joint of both thumbs    Plan: [name] has chronic bilateral carpal tunnel syndrome and is experiencing an exacerbation of her right hand/wrist. At this point she is interested in pursuing carpal tunnel release surgery, but would like to have this set up at the beginning of 2025.  For symptom relief today, she is interested in carpal tunnel injection.  We did perform this under ultrasound guidance for the right median nerve today, patient tolerated well.  She may increase her gabapentin to 600 mg twice daily.  She will continue her Cymbalta 60 mg daily as well.  She will return in her cock-up wrist braces at nighttime.  Hopefully if this settles down her thumbs will also settle down, but we could consider a CMC joint injection in the future as well. She will make an appointment with Dr. Michae Kava Fara Boros) for discussion on surgery. She  will follow-up with me as needed.  Follow-up: Return for make appt with Dr. Fara Boros to discuss b/l carpal tunnel surgery.   Meds & Orders: No orders of the defined types were placed in this encounter.   Orders Placed This Encounter  Procedures   US Guided Needle Placement - No Linked Charges     Procedures: Procedure: US-guided Carpal Tunnel Injection, Right Wrist After informed verbal consent and discussion on R/B/I, a timeout was performed, patient was seated on exam table with the affected arm placed in supine position on table. The area overlying the patient's carpal tunnel was prepped with Chloraprep and alcohol swabs then utilizing ultrasound guidance via an in-plane approach, the patient's carpal tunnel was injected with a mixture of 1:1:1:1 lidocaine:bupivicaine:D5W:betamethasone with hydrodissection of the median nerve from the flexor retinaculum in 360 degree fashion. Visualization of the needle was achieved with a transverse, in-plane approach. Patient tolerated procedure well without immediate complications.      Clinical History: No specialty comments available.  She reports that she has never smoked. She has never used smokeless tobacco.  Recent Labs    12/12/22 1527 09/22/23 1043  HGBA1C 5.5 6.0    EMG & NCV Findings: Evaluation of the left median motor and the right median motor nerves showed prolonged distal onset latency (L5.0, R5.3 ms) and decreased conduction velocity (Elbow-Wrist, L49, R46 m/s).  The left median (across palm) sensory  nerve showed no response (Palm) and prolonged distal peak latency (5.4 ms).  The right median (across palm) sensory nerve showed prolonged distal peak latency (Wrist, 5.0 ms) and prolonged distal peak latency (Palm, 5.6 ms).  All remaining nerves (as indicated in the following tables) were within normal limits.  All left vs. right side differences were within normal limits.     All examined muscles (as indicated in the following table)  showed no evidence of electrical instability.     Impression: The above electrodiagnostic study is ABNORMAL and reveals evidence of a moderate bilateral median nerve entrapment at the wrist (carpal tunnel syndrome) affecting sensory and motor components.    There is no significant electrodiagnostic evidence of any other focal nerve entrapment, brachial plexopathy or cervical radiculopathy.  Objective:    Physical Exam  Gen: Well-appearing, in no acute distress; non-toxic CV: Well-perfused. Warm.  Resp: Breathing unlabored on room air; no wheezing. Psych: Fluid speech in conversation; appropriate affect; normal thought process Neuro: Sensation intact throughout. No gross coordination deficits.   Ortho Exam - Right wrist/hand: There is mild swelling of the fingers bilaterally without warmth or redness.  Positive Tinel's and reproduction of symptoms with Durkan's test.  - Left thumb: There is some mild swelling over the thenar eminence.  Pain with CMC grind and movement about the thumb joint.  Imaging:  - 12/23/22:  3 views of the left hand including AP, oblique and lateral film were  ordered and reviewed by myself.  X-rays demonstrate moderate arthritic  change of the [facility] joint.  Otherwise no acute bony fracture or bony  abnormality noted.   3 views of the right hand including AP, oblique and lateral femoral  ordered and reviewed by myself.  X-rays demonstrate moderate to severe  osteoarthritic change with joint space narrowing at the [facility] joint of the  thumb, there is some sclerosis noted on the ulnar side of the joint.  Some  mild arthritic change throughout the carpal row, although no significant  bony abnormality or fracture.    Past Medical/Family/Surgical/Social History: Medications & Allergies reviewed per EMR, new medications updated. Patient Active Problem List   Diagnosis Date Noted   Hot flashes 09/10/2023   Primary insomnia 09/10/2023   Bronchitis 05/15/2023   Back  pain 04/14/2023   Symptomatic mammary hypertrophy 04/14/2023   Bilateral carpal tunnel syndrome 12/12/2022   Tendonitis 12/12/2022   Cervical myelopathy (HCC) 10/02/2022   Preop examination 09/16/2022   COVID-19 08/20/2022   Acute nonintractable headache 08/20/2022   Pain in right shoulder 06/27/2022   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/07/2022   Primary hypertension 05/07/2022   Neck pain 05/07/2022   Acute left-sided low back pain without sciatica 02/24/2022   Bursitis of left shoulder 01/10/2022   Dry mouth 01/10/2022   History of bariatric surgery 01/10/2022   Elevated blood pressure reading 01/10/2022   Anxiety and depression 01/10/2022   Gastroesophageal reflux disease 01/10/2022   Chronic migraine without aura without status migrainosus, not intractable 08/31/2018   Prediabetes 07/13/2017   At risk for diabetes mellitus 07/02/2017   Insulin resistance 07/02/2017   Vitamin D deficiency 07/02/2017   Other hyperlipidemia 07/02/2017   Morbid obesity (HCC) 06/18/2017   Chronic pain of both shoulders 11/02/2014   Arthritis of both knees 10/04/2014   Past Medical History:  Diagnosis Date   Anxiety    Arthritis    Depression    Gallbladder problem    GERD (gastroesophageal reflux disease)    High blood pressure  Hypertension    Migraine    Obesity    Obstructive sleep apnea    2016 was cleared from having to use CPAP at night. Gastric bypass surgery 2015.   Vitamin D deficiency    Family History  Problem Relation Age of Onset   Diabetes Maternal Grandmother    Heart disease Maternal Grandmother    Diabetes Other    Hyperlipidemia Other    Hypertension Other    BRCA 1/2 Neg Hx    Past Surgical History:  Procedure Laterality Date   ANTERIOR CERVICAL DECOMP/DISCECTOMY FUSION N/A 10/02/2022   Procedure: ANTERIOR CERVICAL DISCECTOMY AND FUSION CERVICAL FOUR TO SEVEN;  Surgeon: Venita Lick, MD;  Location: [facility] OR;  Service: Orthopedics;  Laterality: N/A;  4hrs 3 C-Bed    CHOLECYSTECTOMY     FOOT SURGERY Left 07/26/2022   5th metatarsal   SLEEVE GASTROPLASTY     TUBAL LIGATION     Social History   Occupational History   Occupation: Waitress  Tobacco Use   Smoking status: Never   Smokeless tobacco: Never  Vaping Use   Vaping status: Never Used  Substance and Sexual Activity   Alcohol use: Not Currently    Comment: socially   Drug use: No   Sexual activity: Yes    Birth control/protection: Surgical

## 2023-09-24 ENCOUNTER — Ambulatory Visit: Payer: Commercial Managed Care - HMO | Admitting: Sports Medicine

## 2023-09-25 ENCOUNTER — Ambulatory Visit: Payer: Commercial Managed Care - HMO | Admitting: Sports Medicine

## 2023-09-25 LAB — NICOTINE SCREEN, URINE: Cotinine Ql Scrn, Ur: NEGATIVE ng/mL

## 2023-09-28 LAB — VITAMIN K1, SERUM

## 2023-10-01 LAB — EXTRA SPECIMEN

## 2023-10-01 LAB — IRON,TIBC AND FERRITIN PANEL
%SAT: 31 % (ref 16–45)
Ferritin: 41 ng/mL (ref 16–232)
Iron: 99 ug/dL (ref 45–160)
TIBC: 316 ug/dL (ref 250–450)

## 2023-10-01 LAB — CERULOPLASMIN: Ceruloplasmin: 27 mg/dL (ref 14–48)

## 2023-10-01 LAB — VITAMIN A: Vitamin A (Retinoic Acid): 36 ug/dL — ABNORMAL LOW (ref 38–98)

## 2023-10-01 LAB — VITAMIN B1

## 2023-10-01 LAB — ZINC: Zinc: 77 ug/dL (ref 60–130)

## 2023-10-01 LAB — PTH, INTACT AND CALCIUM
Calcium: 9.6 mg/dL (ref 8.6–10.4)
PTH: 28 pg/mL (ref 16–77)

## 2023-10-01 NOTE — Telephone Encounter (Signed)
I called and spoke with patient and scheduled her for a lab visit.

## 2023-10-02 ENCOUNTER — Other Ambulatory Visit: Payer: Commercial Managed Care - HMO

## 2023-10-02 DIAGNOSIS — Z01812 Encounter for preprocedural laboratory examination: Secondary | ICD-10-CM | POA: Insufficient documentation

## 2023-10-09 ENCOUNTER — Other Ambulatory Visit: Payer: Self-pay | Admitting: Nurse Practitioner

## 2023-10-09 ENCOUNTER — Encounter: Payer: Self-pay | Admitting: Podiatry

## 2023-10-09 ENCOUNTER — Ambulatory Visit (INDEPENDENT_AMBULATORY_CARE_PROVIDER_SITE_OTHER): Payer: Managed Care, Other (non HMO) | Admitting: Podiatry

## 2023-10-09 DIAGNOSIS — Q828 Other specified congenital malformations of skin: Secondary | ICD-10-CM

## 2023-10-09 NOTE — Progress Notes (Signed)
Subjective:  Patient ID: Kim Watson, female    DOB: [birth year],  MRN: [id number]  Chief Complaint  Patient presents with   Foot Pain    Patient states has a spot on the bottom of her foot causing pain thinks it is a corn.    52 y.o. female presents with the above complaint.  Patient presents with new complaint of left midfoot porokeratotic lesion/painful lesion.  Patient states is hurts with ambulation worse with pressure she has not seen anyone else prior to seeing me she would like to discuss treatment options for this pain scale 7 out of 10 dull aching nature   Review of Systems: Negative except as noted in the HPI. Denies N/V/F/Ch.  Past Medical History:  Diagnosis Date   Anxiety    Arthritis    Depression    Gallbladder problem    GERD (gastroesophageal reflux disease)    High blood pressure    Hypertension    Migraine    Obesity    Obstructive sleep apnea    2016 was cleared from having to use CPAP at night. Gastric bypass surgery 2015.   Vitamin D deficiency     Current Outpatient Medications:    clonazePAM (KLONOPIN) 0.5 MG disintegrating tablet, DISSOLVE 1 TABLET(0.5 MG) ON THE TONGUE TWICE DAILY, Disp: 30 tablet, Rfl: 0   clotrimazole-betamethasone (LOTRISONE) cream, APPLY TOPICALLY TO THE AFFECTED AREA DAILY, Disp: 45 g, Rfl: 0   fluticasone (FLONASE) 50 MCG/ACT nasal spray, Place 2 sprays into both nostrils daily., Disp: 16 g, Rfl: 6   losartan-hydrochlorothiazide (HYZAAR) 50-12.5 MG tablet, TAKE 1 TABLET BY MOUTH DAILY, Disp: 90 tablet, Rfl: 1   methocarbamol (ROBAXIN) 500 MG tablet, , Disp: , Rfl:    nystatin (MYCOSTATIN/NYSTOP) powder, Apply 1 Application topically 3 (three) times daily., Disp: 60 g, Rfl: 3   ondansetron (ZOFRAN) 4 MG tablet, Take 1 tablet (4 mg total) by mouth every 8 (eight) hours as needed for nausea or vomiting., Disp: 20 tablet, Rfl: 0   pantoprazole (PROTONIX) 40 MG tablet, TAKE 1 TABLET(40 MG) BY MOUTH DAILY, Disp: 90 tablet, Rfl: 1    traZODone (DESYREL) 50 MG tablet, Take 0.5-1 tablets (25-50 mg total) by mouth at bedtime as needed for sleep., Disp: 30 tablet, Rfl: 1   valACYclovir (VALTREX) 500 MG tablet, Take 1 tablet (500 mg total) by mouth 2 (two) times daily as needed (flare-up). Take 1 tablet twice a day for 3 days as needed for flare-up, Disp: 30 tablet, Rfl: 1   gabapentin (NEURONTIN) 300 MG capsule, Take 1 capsule (300 mg total) by mouth 2 (two) times daily., Disp: 180 capsule, Rfl: 0   venlafaxine XR (EFFEXOR-XR) 75 MG 24 hr capsule, TAKE 1 CAPSULE(75 MG) BY MOUTH DAILY WITH BREAKFAST, Disp: 90 capsule, Rfl: 0  Social History   Tobacco Use  Smoking Status Never  Smokeless Tobacco Never    Allergies  Allergen Reactions   Nsaids Other (See Comments)    History of gastric bypass   Zestril [Lisinopril] Cough        Objective:  There were no vitals filed for this visit. There is no height or weight on file to calculate BMI. Constitutional Well developed. Well nourished.  Vascular Dorsalis pedis pulses palpable bilaterally. Posterior tibial pulses palpable bilaterally. Capillary refill normal to all digits.  No cyanosis or clubbing noted. Pedal hair growth normal.  Neurologic Normal speech. Oriented to person, place, and time. Epicritic sensation to light touch grossly present bilaterally.  Dermatologic Left  midfoot porokeratotic lesion with central nucleated core.  Pain on palpation to the lesion.  No painful bleeding noted upon debridement  Orthopedic: Normal joint ROM without pain or crepitus bilaterally. No visible deformities. No bony tenderness.   Radiographs: None Assessment:  No diagnosis found. Plan:  Patient was evaluated and treated and all questions answered.  Left midfoot porokeratosis -All questions and concerns were discussed with the hand patient in extensive detail -Using chisel blade handle the lesion was debrided down to healthy dry tissue.  No complication noted no pinpoint  bleeding noted.  If it continues to bother her we will discuss surgical excision.  No follow-ups on file.

## 2023-10-12 DIAGNOSIS — M792 Neuralgia and neuritis, unspecified: Secondary | ICD-10-CM | POA: Insufficient documentation

## 2023-10-12 NOTE — Telephone Encounter (Signed)
Requesting: VENLAFAXINE ER 75MG  CAPSULES  Last Visit: 09/22/2023 Next Visit: Visit date not found Last Refill: 09/10/2023  Please Advise

## 2023-10-13 ENCOUNTER — Encounter: Payer: Self-pay | Admitting: Nurse Practitioner

## 2023-10-20 DIAGNOSIS — Z9884 Bariatric surgery status: Secondary | ICD-10-CM | POA: Insufficient documentation

## 2023-10-28 ENCOUNTER — Ambulatory Visit: Payer: Commercial Managed Care - HMO | Admitting: Orthopedic Surgery

## 2023-10-28 DIAGNOSIS — G5603 Carpal tunnel syndrome, bilateral upper limbs: Secondary | ICD-10-CM | POA: Diagnosis not present

## 2023-10-28 DIAGNOSIS — M180 Bilateral primary osteoarthritis of first carpometacarpal joints: Secondary | ICD-10-CM | POA: Diagnosis not present

## 2023-10-28 NOTE — Progress Notes (Signed)
Kim Watson - 52 y.o. female MRN [id number]  Date of birth: 1971/08/28  Office Visit Note: Visit Date: 10/28/2023 PCP: Gerre Scull, NP Referred by: Gerre Scull, NP  Subjective: No chief complaint on file.  HPI: Kim Watson is a pleasant 52 y.o. female who presents today for evaluation of bilateral carpal tunnel syndrome that has been refractory to conservative care.  Her symptoms have been present for years, worsening in nature.  She has ongoing numbness and tingling bilaterally with associated nocturnal symptoms.  She has trialed bracing, activity modification as well as injections bilaterally to the carpal tunnels without lasting relief.  She does have an EMG from February of this year which confirms diagnosis of bilateral carpal tunnel syndrome.  Pertinent ROS were reviewed with the patient and found to be negative unless otherwise specified above in HPI.   Visit Reason: bilateral carpal tunnel syndrome Duration of symptoms: years Hand dominance: right Occupation:Cashier Diabetic: No Smoking: No Heart/Lung History:none Blood Thinners: none  Prior Testing/EMG: xrays 12/23/22, EMG 01/16/23 Injections (Date): September 2024 left carpal tunnel, October 2024 right carpal tunnel Prior Surgery: none  Assessment & Plan: Visit Diagnoses: No diagnosis found.  Plan: Extensive discussion was had with the patient today about her ongoing bilateral carpal tunnel syndrome that is refractory to conservative care.  Patient has both clinical and electrodiagnostic evidence to confirm this diagnosis.  At this juncture, she is indicated for bilateral, staged open versus endoscopic carpal tunnel release.  Risks and benefits of both operations were discussed in detail today.  Understanding all risks and benefits, patient would like to have surgery done in the form of bilateral, staged open carpal tunnel release under local anesthesia.  Risks include but not limited to infection,  bleeding, scarring, stiffness, nerve injury or vascular, tendon injury, risk of recurrence and need for subsequent operation were all discussed in detail.  Patient consented understanding the above.  Will move forward surgical scheduling.  Will begin with the left side.  Surgical scheduling will contact her regarding scheduling of a left open carpal tunnel release under local anesthesia for the next available date.  We also did discuss her bilateral thumb CMC osteoarthritis.  For the time being, she will continue with bracing, activity modification.  We did discuss the possibility for cortisone injection in the future.  She will wait until after her carpal tunnel surgeries have been addressed.   Follow-up: No follow-ups on file.   Meds & Orders: No orders of the defined types were placed in this encounter.  No orders of the defined types were placed in this encounter.    Procedures: No procedures performed      Clinical History: No specialty comments available.  She reports that she has never smoked. She has never used smokeless tobacco.  Recent Labs    12/12/22 1527 09/22/23 1043  HGBA1C 5.5 6.0    Objective:   Vital Signs: There were no vitals taken for this visit.  Physical Exam  Gen: Well-appearing, in no acute distress; non-toxic CV: Regular Rate. Well-perfused. Warm.  Resp: Breathing unlabored on room air; no wheezing. Psych: Fluid speech in conversation; appropriate affect; normal thought process  Ortho Exam PHYSICAL EXAM:  General: Patient is well appearing and in no distress. Cervical spine mobility is full in all directions:  Skin and Muscle: No significant skin changes are apparent to upper extremities.   Range of Motion and Palpation Tests: Mobility is full about the elbows with flexion and extension.  Forearm supination and pronation are 85/85 bilaterally.  Wrist flexion/extension is 75/65 bilaterally.  Digital flexion and extension are full.  Thumb opposition  is full to the base of the small fingers bilaterally.    No cords or nodules are palpated.  No triggering is observed.    Moderate tenderness over the thumb CMC articulation bilaterally is observed.  CMC grind is positive for pain, mild crepitus bilaterally.  Scaphoid shift test is negative bilaterally.  Finklestein test is negative bilaterally.  Ulnar impingement test is negative bilaterally.  No evidence of radiocarpal, midcarpal or intercarpal joint instability with provocation.  Neurologic, Vascular, Motor: Sensation is diminished to light touch in the bilateral median nerve distributions.  2-point discrimination is intact between 5 and 6 mm bilateral median nerve distributions.   Tinel's testing positive bilateral carpal tunnel Phalen's positive bilateral, Derkan's compression positive bilateral Thenar atrophy: Negative bilateral APB: 5/5 bilateral  Fingers pink and well perfused.  Capillary refill is brisk.     Lab Results  Component Value Date   HGBA1C 6.0 09/22/2023     Imaging: No results found.  Past Medical/Family/Surgical/Social History: Medications & Allergies reviewed per EMR, new medications updated. 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/07/2022   Primary hypertension 05/07/2022   Neck pain 05/07/2022   Acute left-sided low back pain without sciatica 02/24/2022   Bursitis of left shoulder 01/10/2022   Dry mouth 01/10/2022   History of bariatric surgery 01/10/2022   Elevated blood pressure reading  01/10/2022   Anxiety and depression 01/10/2022   Gastroesophageal reflux disease 01/10/2022   Chronic migraine without aura without status migrainosus, not intractable 08/31/2018   Prediabetes 07/13/2017   At risk for diabetes mellitus 07/02/2017   Insulin resistance 07/02/2017   Vitamin D deficiency 07/02/2017   Other hyperlipidemia 07/02/2017   Morbid obesity (HCC) 06/18/2017   Chronic pain of both shoulders 11/02/2014   Arthritis of both knees 10/04/2014   Past Medical History:  Diagnosis Date   Anxiety    Arthritis    Depression    Gallbladder problem    GERD (gastroesophageal reflux disease)    High blood pressure    Hypertension    Migraine    Obesity    Obstructive sleep apnea    2016 was cleared from having to use CPAP at night. Gastric bypass surgery 2015.   Vitamin D deficiency    Family History  Problem Relation Age of Onset   Diabetes Maternal Grandmother    Heart disease Maternal Grandmother    Diabetes Other    Hyperlipidemia Other    Hypertension Other    BRCA 1/2 Neg Hx    Past Surgical History:  Procedure Laterality Date   ANTERIOR CERVICAL DECOMP/DISCECTOMY FUSION N/A 10/02/2022   Procedure: ANTERIOR CERVICAL DISCECTOMY AND FUSION CERVICAL FOUR TO SEVEN;  Surgeon: Venita Lick, MD;  Location: [facility] OR;  Service: Orthopedics;  Laterality: N/A;  4hrs 3 C-Bed   CHOLECYSTECTOMY     FOOT SURGERY Left 07/26/2022   5th metatarsal   SLEEVE GASTROPLASTY  TUBAL LIGATION     Social History   Occupational History   Occupation: Waitress  Tobacco Use   Smoking status: Never   Smokeless tobacco: Never  Vaping Use   Vaping status: Never Used  Substance and Sexual Activity   Alcohol use: Not Currently    Comment: socially   Drug use: No   Sexual activity: Yes    Birth control/protection: Surgical    Jamilya Sarrazin Fara Boros) Denese Killings, M.D. [facility] 11:39 AM

## 2023-11-18 ENCOUNTER — Other Ambulatory Visit: Payer: Self-pay | Admitting: Nurse Practitioner

## 2023-11-19 ENCOUNTER — Other Ambulatory Visit: Payer: Self-pay | Admitting: Sports Medicine

## 2023-11-19 NOTE — Telephone Encounter (Signed)
Requesting:  VENLAFAXINE ER 75MG  CAPSULES  Last Visit: 09/22/2023 Next Visit: Visit date not found Last Refill: 10/12/2023 Qty 90 with no refill sent in  Please Advise

## 2023-11-24 ENCOUNTER — Other Ambulatory Visit: Payer: Self-pay | Admitting: Nurse Practitioner

## 2023-11-26 NOTE — Telephone Encounter (Signed)
Requesting:  VENLAFAXINE ER 75MG  CAPSULES Last Visit: 09/22/2023 Next Visit: Visit date not found Last Refill: 11/19/2023 qty 90 with 1 refill  Please Advise

## 2023-12-06 ENCOUNTER — Other Ambulatory Visit: Payer: Self-pay | Admitting: Family

## 2023-12-06 DIAGNOSIS — J209 Acute bronchitis, unspecified: Secondary | ICD-10-CM

## 2023-12-07 ENCOUNTER — Telehealth: Payer: Self-pay | Admitting: Plastic Surgery

## 2023-12-07 DIAGNOSIS — Z6841 Body Mass Index (BMI) 40.0 and over, adult: Secondary | ICD-10-CM | POA: Diagnosis not present

## 2023-12-07 DIAGNOSIS — Z713 Dietary counseling and surveillance: Secondary | ICD-10-CM | POA: Diagnosis not present

## 2023-12-07 NOTE — Telephone Encounter (Signed)
 Weather hour delay moving pt with the dalay, pt called and agreed to come in later

## 2023-12-08 ENCOUNTER — Ambulatory Visit: Payer: Commercial Managed Care - HMO | Admitting: Plastic Surgery

## 2023-12-08 ENCOUNTER — Encounter: Payer: Self-pay | Admitting: Plastic Surgery

## 2023-12-08 VITALS — BP 149/75 | HR 81 | Wt 284.0 lb

## 2023-12-08 DIAGNOSIS — M546 Pain in thoracic spine: Secondary | ICD-10-CM

## 2023-12-08 DIAGNOSIS — Z6841 Body Mass Index (BMI) 40.0 and over, adult: Secondary | ICD-10-CM | POA: Diagnosis not present

## 2023-12-08 DIAGNOSIS — N62 Hypertrophy of breast: Secondary | ICD-10-CM | POA: Diagnosis not present

## 2023-12-08 DIAGNOSIS — G8929 Other chronic pain: Secondary | ICD-10-CM

## 2023-12-08 DIAGNOSIS — M542 Cervicalgia: Secondary | ICD-10-CM

## 2023-12-08 DIAGNOSIS — Z9884 Bariatric surgery status: Secondary | ICD-10-CM | POA: Diagnosis not present

## 2023-12-08 NOTE — Progress Notes (Signed)
 Patient ID: ASENETH HACK, female    DOB: February 16, 1971, 53 y.o.   MRN: [id number]   Chief Complaint  Patient presents with   Breast Problem    The patient is a 53 y.o. female here for a follow-up.  She has a history of mammary hyperplasia.  Her breasts are extremely large and the cause of much discomfort in her back and neck area.  She complains of having to use pins or having to make adjustments to her bra in order to relieve the pain and lift her breasts. She notices relief by holding her breast up manually.  Her shoulder straps cause grooves and pain and pressure that requires padding for relief. Pain medication is sometimes required with motrin  and tylenol .  Activities that are hindered by enlarged breasts include: exercise and running.  She has tried supportive clothing as well as fitted bras without improvement.  Her breasts are extremely large and fairly symmetric.  She has hyperpigmentation of the inframammary area on both sides. She is 5 feet 6 inches tall and weighs 284 pounds.  The BMI = 45.8 kg/m.  She has been trying to undergo gastric surgery for help with low weight loss.  She had a sleeve but that did not help.  She has been at [facility] but now is at [facility] for a workup.  The estimated excess breast tissue to be removed at the time of surgery = 1050 grams on the left and 1050 grams on the right.  We would definitely want her to have the gastric surgery prior to a breast reduction so that she will have a better long-term result on size and shape.    Review of Systems  Constitutional: Negative.   Eyes: Negative.   Respiratory: Negative.  Negative for chest tightness and shortness of breath.   Cardiovascular: Negative.   Gastrointestinal: Negative.   Endocrine: Negative.   Genitourinary: Negative.   Musculoskeletal:  Positive for back pain and neck pain.  Skin:  Positive for rash.    Past Medical History:  Diagnosis Date   Anxiety    Arthritis    Depression    Gallbladder  problem    GERD (gastroesophageal reflux disease)    High blood pressure    Hypertension    Migraine    Obesity    Obstructive sleep apnea    2016 was cleared from having to use CPAP at night. Gastric bypass surgery 2015.   Vitamin D  deficiency     Past Surgical History:  Procedure Laterality Date   ANTERIOR CERVICAL DECOMP/DISCECTOMY FUSION N/A 10/02/2022   Procedure: ANTERIOR CERVICAL DISCECTOMY AND FUSION CERVICAL FOUR TO SEVEN;  Surgeon: Burnetta Aures, MD;  Location: [facility] OR;  Service: Orthopedics;  Laterality: N/A;  4hrs 3 C-Bed   CHOLECYSTECTOMY     FOOT SURGERY Left 07/26/2022   5th metatarsal   SLEEVE GASTROPLASTY     TUBAL LIGATION        Current Outpatient Medications:    clonazePAM  (KLONOPIN ) 0.5 MG disintegrating tablet, DISSOLVE 1 TABLET(0.5 MG) ON THE TONGUE TWICE DAILY, Disp: 30 tablet, Rfl: 0   clotrimazole -betamethasone  (LOTRISONE ) cream, APPLY TOPICALLY TO THE AFFECTED AREA DAILY, Disp: 45 g, Rfl: 0   fluticasone  (FLONASE ) 50 MCG/ACT nasal spray, Place 2 sprays into both nostrils daily., Disp: 16 g, Rfl: 6   gabapentin  (NEURONTIN ) 300 MG capsule, TAKE 1 CAPSULE(300 MG) BY MOUTH TWICE DAILY, Disp: 180 capsule, Rfl: 0   losartan -hydrochlorothiazide  (HYZAAR) 50-12.5 MG tablet, TAKE 1 TABLET  BY MOUTH DAILY, Disp: 90 tablet, Rfl: 1   nystatin  (MYCOSTATIN /NYSTOP ) powder, Apply 1 Application topically 3 (three) times daily., Disp: 60 g, Rfl: 3   ondansetron  (ZOFRAN ) 4 MG tablet, Take 1 tablet (4 mg total) by mouth every 8 (eight) hours as needed for nausea or vomiting., Disp: 20 tablet, Rfl: 0   pantoprazole  (PROTONIX ) 40 MG tablet, TAKE 1 TABLET(40 MG) BY MOUTH DAILY, Disp: 90 tablet, Rfl: 1   traZODone  (DESYREL ) 50 MG tablet, Take 0.5-1 tablets (25-50 mg total) by mouth at bedtime as needed for sleep., Disp: 30 tablet, Rfl: 1   valACYclovir  (VALTREX ) 500 MG tablet, Take 1 tablet (500 mg total) by mouth 2 (two) times daily as needed (flare-up). Take 1 tablet twice a day for  3 days as needed for flare-up, Disp: 30 tablet, Rfl: 1   venlafaxine  XR (EFFEXOR -XR) 75 MG 24 hr capsule, TAKE 1 CAPSULE(75 MG) BY MOUTH DAILY WITH BREAKFAST, Disp: 90 capsule, Rfl: 1   Objective:   Vitals:   12/08/23 1020  BP: (!) 149/75  Pulse: 81  SpO2: 95%    Physical Exam Vitals reviewed.  Constitutional:      Appearance: Normal appearance.  HENT:     Head: Normocephalic and atraumatic.  Cardiovascular:     Rate and Rhythm: Normal rate.     Pulses: Normal pulses.  Pulmonary:     Effort: Pulmonary effort is normal.  Abdominal:     General: There is no distension.     Palpations: Abdomen is soft.  Musculoskeletal:        General: No swelling or deformity.  Skin:    General: Skin is warm.     Capillary Refill: Capillary refill takes less than 2 seconds.     Coloration: Skin is not jaundiced or pale.     Findings: No bruising.  Neurological:     Mental Status: She is alert and oriented to person, place, and time.  Psychiatric:        Mood and Affect: Mood normal.        Behavior: Behavior normal.        Thought Content: Thought content normal.        Judgment: Judgment normal.     Assessment & Plan:  BMI 45.0-49.9, adult (HCC) - Plan: Amb Ref to Medical Weight Management  Symptomatic mammary hypertrophy  Chronic bilateral thoracic back pain  Patient knows to give us  a call once she finds out what will happen with her gastric surgery and then we can move forward with her breast reduction.  Estefana RAMAN Caroll Cunnington, DO

## 2023-12-14 ENCOUNTER — Other Ambulatory Visit: Payer: Self-pay | Admitting: Nurse Practitioner

## 2023-12-14 NOTE — Telephone Encounter (Signed)
 Requesting: PANTOPRAZOLE 40MG  TABLETS  Last Visit: 09/22/2023 Next Visit: Visit date not found Last Refill: 06/10/2023  Please Advise

## 2023-12-23 ENCOUNTER — Other Ambulatory Visit: Payer: Self-pay | Admitting: Nurse Practitioner

## 2023-12-23 NOTE — Telephone Encounter (Signed)
Requesting: CLONAZEPAM ODT 0.5MG  TABLETS , CLOTRIMAZOLE-BETAMETHASONE CRM 45GM  Last Visit: 09/22/2023 Next Visit: Visit date not found Last Refill: 08/26/23, 06/10/23  Please Advise

## 2023-12-25 DIAGNOSIS — I10 Essential (primary) hypertension: Secondary | ICD-10-CM | POA: Diagnosis not present

## 2023-12-25 DIAGNOSIS — Z9884 Bariatric surgery status: Secondary | ICD-10-CM | POA: Diagnosis not present

## 2023-12-25 DIAGNOSIS — Z713 Dietary counseling and surveillance: Secondary | ICD-10-CM | POA: Diagnosis not present

## 2023-12-25 DIAGNOSIS — Z6841 Body Mass Index (BMI) 40.0 and over, adult: Secondary | ICD-10-CM | POA: Diagnosis not present

## 2023-12-31 ENCOUNTER — Other Ambulatory Visit: Payer: Self-pay | Admitting: Orthopedic Surgery

## 2023-12-31 DIAGNOSIS — G5602 Carpal tunnel syndrome, left upper limb: Secondary | ICD-10-CM | POA: Diagnosis not present

## 2023-12-31 MED ORDER — ACETAMINOPHEN-CODEINE 300-30 MG PO TABS: 1.0000 | ORAL_TABLET | Freq: Four times a day (QID) | ORAL | 0 refills | Status: DC | PRN

## 2024-01-01 ENCOUNTER — Telehealth: Payer: Self-pay

## 2024-01-01 ENCOUNTER — Other Ambulatory Visit (HOSPITAL_COMMUNITY): Payer: Self-pay

## 2024-01-01 DIAGNOSIS — F32A Depression, unspecified: Secondary | ICD-10-CM | POA: Diagnosis not present

## 2024-01-01 DIAGNOSIS — F419 Anxiety disorder, unspecified: Secondary | ICD-10-CM | POA: Diagnosis not present

## 2024-01-01 NOTE — Telephone Encounter (Signed)
Pharmacy Patient Advocate Encounter   Received notification from Pt Calls Messages that prior authorization for Clonazepam 0.5mg  ODT  is required/requested.   Insurance verification completed.   The patient is insured through [facility] .   Per test claim: PA required; PA submitted to above mentioned insurance via CoverMyMeds Key/confirmation #/EOC [id number] Status is pending

## 2024-01-01 NOTE — Telephone Encounter (Signed)
Can we get a prior auth for Clonazepam ODT 0.5mg  Tablets?  Patient ID# [id number]

## 2024-01-03 DIAGNOSIS — F32A Depression, unspecified: Secondary | ICD-10-CM | POA: Diagnosis not present

## 2024-01-03 DIAGNOSIS — F419 Anxiety disorder, unspecified: Secondary | ICD-10-CM | POA: Diagnosis not present

## 2024-01-04 NOTE — Telephone Encounter (Signed)
Pharmacy Patient Advocate Encounter  Received notification from [facility] that Prior Authorization for Clonazepam 0.5mg  ODT has been CANCELLED due to this member has alternative pharmacy benefits.    PA #/Case ID/Reference #: [id number]

## 2024-01-05 ENCOUNTER — Encounter: Payer: Self-pay | Admitting: Dermatology

## 2024-01-05 ENCOUNTER — Other Ambulatory Visit (HOSPITAL_COMMUNITY): Payer: Self-pay

## 2024-01-05 ENCOUNTER — Ambulatory Visit (INDEPENDENT_AMBULATORY_CARE_PROVIDER_SITE_OTHER): Payer: Medicaid Other | Admitting: Dermatology

## 2024-01-05 VITALS — BP 134/86 | HR 72

## 2024-01-05 DIAGNOSIS — R5382 Chronic fatigue, unspecified: Secondary | ICD-10-CM | POA: Insufficient documentation

## 2024-01-05 DIAGNOSIS — D485 Neoplasm of uncertain behavior of skin: Secondary | ICD-10-CM

## 2024-01-05 DIAGNOSIS — D492 Neoplasm of unspecified behavior of bone, soft tissue, and skin: Secondary | ICD-10-CM

## 2024-01-05 DIAGNOSIS — N898 Other specified noninflammatory disorders of vagina: Secondary | ICD-10-CM | POA: Insufficient documentation

## 2024-01-05 DIAGNOSIS — L920 Granuloma annulare: Secondary | ICD-10-CM | POA: Diagnosis not present

## 2024-01-05 DIAGNOSIS — M2550 Pain in unspecified joint: Secondary | ICD-10-CM | POA: Insufficient documentation

## 2024-01-05 DIAGNOSIS — F419 Anxiety disorder, unspecified: Secondary | ICD-10-CM | POA: Insufficient documentation

## 2024-01-05 DIAGNOSIS — F330 Major depressive disorder, recurrent, mild: Secondary | ICD-10-CM | POA: Insufficient documentation

## 2024-01-05 DIAGNOSIS — N951 Menopausal and female climacteric states: Secondary | ICD-10-CM | POA: Insufficient documentation

## 2024-01-05 DIAGNOSIS — R6882 Decreased libido: Secondary | ICD-10-CM | POA: Insufficient documentation

## 2024-01-05 DIAGNOSIS — R5383 Other fatigue: Secondary | ICD-10-CM | POA: Insufficient documentation

## 2024-01-05 NOTE — Progress Notes (Signed)
 New Patient Visit   Subjective  Kim Watson is a 53 y.o. female who presents for the following: Skin Irritaion  Patient states she has skin irritation located at the scattered that she would like to have examined. Patient reports the areas have been there for several years. She reports the areas are not bothersome.Patient rates irritation 0 out of 10. She states that the areas have spread. Patient reports she has previously been treated for these areas (Patient has tried and failed Clotrimazole -betamethasone  and TMC. She last applied Clotrimazole -betamethasone  on Saturday.)  Patient denies Hx of bx. Patient denies family history of skin cancer(s).  The following portions of the chart were reviewed this encounter and updated as appropriate: medications, allergies, medical history  Review of Systems:  No other skin or systemic complaints except as noted in HPI or Assessment and Plan.  Objective  Well appearing patient in no apparent distress; mood and affect are within normal limits.  A focused examination was performed of the following areas: B/L Arms and legs  Relevant exam findings are noted in the Assessment and Plan.               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 Previous treatments, including OTC tonic and recent application of clotrimazole -betamethasone , have been ineffective. Differential diagnosis previously included tinea, which was ruled out through skin scrapings.  Plan: Perform 3mm punch biopsy to confirm diagnosis. Continue betamethasone  application: 2 weeks on, 2 weeks off. Discontinue clotrimazole  component of current topical medication. Provide patient education on GA, including  potential for spontaneous resolution and limited treatment options. Send MyChart message with biopsy report and updated treatment plan.  Possible Psoriasis vs Granuloma Annulare on Right Elbow Assessment: Patient has a long-standing plaque on the right elbow, present for over a decade, with some scaling and partial response to topical steroids. The lesion becomes less inflamed but scabby, suggesting the possibility of psoriasis, although GA remains in the differential diagnosis due to the lack of silvery scale and variable response to topical steroids.  Plan: Include right elbow lesion in 3mm punch biopsy to differentiate between psoriasis and GA. Continue betamethasone  application: 2 weeks on, 2 weeks off. Reassess treatment plan based on biopsy results. NEOPLASM OF UNCERTAIN BEHAVIOR OF SKIN Right Elbow - Posterior Skin / nail biopsy Type of biopsy: punch   Informed consent: discussed and consent obtained   Timeout: patient name, date of birth, surgical site, and procedure verified   Procedure prep:  Patient was prepped and draped in usual sterile fashion Prep type:  Isopropyl alcohol Anesthesia: the lesion was anesthetized in a standard fashion   Anesthetic:  1% lidocaine  w/ epinephrine  1-100,000 buffered w/ 8.4% NaHCO3 Punch size:  3 mm Suture size:  4-0 Suture type: nylon   Suture removal (days):  14 Hemostasis achieved with: suture and aluminum chloride   Outcome: patient tolerated procedure well   Post-procedure details: sterile dressing applied and wound care instructions given   Dressing type: petrolatum gauze   Specimen 1 - Surgical pathology Differential Diagnosis: Granuloma Annulare vs Psoriasis vs Rash  Check Margins: No  Return in about 2 weeks (around 01/19/2024) for Bx results OV & Suture Removal F/U.  Documentation: I have reviewed the above documentation for accuracy and completeness, and I  agree with the above.  I, Jetta Ager, am acting as scribe for Delon Lenis,  DO.  Delon Lenis, DO

## 2024-01-05 NOTE — Telephone Encounter (Signed)
 I called [facility] pharmacy and was speaking with a pharmacist and then the phone went to silent but the call was still connected.  I called the pharmacy back a few times and an automated message that the pharmacy is having phone issues.  I will try again later.

## 2024-01-05 NOTE — Telephone Encounter (Signed)
 I called patient and spoke with patient and she said that she has had issues contacting someone from [facility] pharmacy for 8 days now. She said that she will stop by there and ask them about her medication and I told patient I will try to keep calling them myself.

## 2024-01-05 NOTE — Patient Instructions (Addendum)
 Hello Kim Watson,  Thank you for visiting my office today.   Here is a summary of the key instructions and next steps from our consultation:  Biopsy Procedure: We  performed a 3mm punch biopsy on the affected area on right elbow to ascertain whether the condition is Granuloma Annulare (GA) or psoriasis.   Medication for areas on inner thighs: Please continue with your current regimen of Clotrimazole -Betamethasone  cream with the following instructions:   Application Cycle: Apply the cream for 2 weeks on, followed by 2 weeks off. This cycle is to manage symptoms effectively while minimizing potential side effects, such as stretch marks.  Follow-Up: We will communicate the biopsy results to you via MyChart. Based on these findings, we will update your treatment plan accordingly.  Please do not hesitate to reach out if you have any questions or concerns before our next meeting.  Best regards,  Dr. Delon Lenis, Dermatology   Patient Handout: Wound Care for Skin Biopsy Site  Taking Care of Your Skin Biopsy Site  Proper care of the biopsy site is essential for promoting healing and minimizing scarring. This handout provides instructions on how to care for your biopsy site to ensure optimal recovery.  1. Cleaning the Wound:  Clean the biopsy site daily with gentle soap and water . Gently pat the area dry with a clean, soft towel. Avoid harsh scrubbing or rubbing the area, as this can irritate the skin and delay healing.  2. Applying Aquaphor and Bandage:  After cleaning the wound, apply a thin layer of Aquaphor ointment to the biopsy site. Cover the area with a sterile bandage to protect it from dirt, bacteria, and friction. Change the bandage daily or as needed if it becomes soiled or wet.  3. Continued Care for One Week:  Repeat the cleaning, Aquaphor application, and bandaging process daily for one week following the biopsy procedure. Keeping the wound clean and moist during this  initial healing period will help prevent infection and promote optimal healing.  4. Massaging Aquaphor into the Area:  ---After one week, discontinue the use of bandages but continue to apply Aquaphor to the biopsy site. ----Gently massage the Aquaphor into the area using circular motions. ---Massaging the skin helps to promote circulation and prevent the formation of scar tissue.   Additional Tips:  Avoid exposing the biopsy site to direct sunlight during the healing process, as this can cause hyperpigmentation or worsen scarring. If you experience any signs of infection, such as increased redness, swelling, warmth, or drainage from the wound, contact your healthcare provider immediately. Follow any additional instructions provided by your healthcare provider for caring for the biopsy site and managing any discomfort. Conclusion:  Taking proper care of your skin biopsy site is crucial for ensuring optimal healing and minimizing scarring. By following these instructions for cleaning, applying Aquaphor, and massaging the area, you can promote a smooth and successful recovery. If you have any questions or concerns about caring for your biopsy site, don't hesitate to contact your healthcare provider for guidance.    Important Information   Due to recent changes in healthcare laws, you may see results of your pathology and/or laboratory studies on MyChart before the doctors have had a chance to review them. We understand that in some cases there may be results that are confusing or concerning to you. Please understand that not all results are received at the same time and often the doctors may need to interpret multiple results in order to provide you with  the best plan of care or course of treatment. Therefore, we ask that you please give us  2 business days to thoroughly review all your results before contacting the office for clarification. Should we see a critical lab result, you will be contacted  sooner.     If You Need Anything After Your Visit   If you have any questions or concerns for your doctor, please call our main line at [phone]. If no one answers, please leave a voicemail as directed and we will return your call as soon as possible. Messages left after 4 pm will be answered the following business day.    You may also send us  a message via MyChart. We typically respond to MyChart messages within 1-2 business days.  For prescription refills, please ask your pharmacy to contact our office. Our fax number is [phone].  If you have an urgent issue when the clinic is closed that cannot wait until the next business day, you can page your doctor at the number below.     Please note that while we do our best to be available for urgent issues outside of office hours, we are not available 24/7.    If you have an urgent issue and are unable to reach us , you may choose to seek medical care at your doctor's office, retail clinic, urgent care center, or emergency room.   If you have a medical emergency, please immediately call 911 or go to the emergency department. In the event of inclement weather, please call our main line at [phone] for an update on the status of any delays or closures.  Dermatology Medication Tips: Please keep the boxes that topical medications come in in order to help keep track of the instructions about where and how to use these. Pharmacies typically print the medication instructions only on the boxes and not directly on the medication tubes.   If your medication is too expensive, please contact our office at [phone] or send us  a message through MyChart.    We are unable to tell what your co-pay for medications will be in advance as this is different depending on your insurance coverage. However, we may be able to find a substitute medication at lower cost or fill out paperwork to get insurance to cover a needed medication.    If a prior  authorization is required to get your medication covered by your insurance company, please allow us  1-2 business days to complete this process.   Drug prices often vary depending on where the prescription is filled and some pharmacies may offer cheaper prices.   The website www.goodrx.com contains coupons for medications through different pharmacies. The prices here do not account for what the cost may be with help from insurance (it may be cheaper with your insurance), but the website can give you the price if you did not use any insurance.  - You can print the associated coupon and take it with your prescription to the pharmacy.  - You may also stop by our office during regular business hours and pick up a GoodRx coupon card.  - If you need your prescription sent electronically to a different pharmacy, notify our office through [facility] or by phone at [phone]

## 2024-01-06 NOTE — Telephone Encounter (Signed)
I called patient to follow up with medication and no answer and no voicemail. I will ry to call again later.

## 2024-01-07 LAB — SURGICAL PATHOLOGY

## 2024-01-08 ENCOUNTER — Other Ambulatory Visit (HOSPITAL_BASED_OUTPATIENT_CLINIC_OR_DEPARTMENT_OTHER): Payer: Self-pay

## 2024-01-11 ENCOUNTER — Encounter: Payer: Self-pay | Admitting: Dermatology

## 2024-01-11 NOTE — Progress Notes (Signed)
 Hello Kim Watson,  You biopsy of your elbow confirmed a diagnosis of granuloma annulare.   I reocmmened you continue to follow the treatment regiment outlined at your last visit until your next follow up.  The full report is available on your MyChart.  Have a great evening!  ~Dr. Alm

## 2024-01-14 ENCOUNTER — Other Ambulatory Visit (INDEPENDENT_AMBULATORY_CARE_PROVIDER_SITE_OTHER): Payer: Self-pay

## 2024-01-14 ENCOUNTER — Ambulatory Visit (INDEPENDENT_AMBULATORY_CARE_PROVIDER_SITE_OTHER): Payer: Medicaid Other | Admitting: Orthopedic Surgery

## 2024-01-14 DIAGNOSIS — M1811 Unilateral primary osteoarthritis of first carpometacarpal joint, right hand: Secondary | ICD-10-CM | POA: Diagnosis not present

## 2024-01-14 DIAGNOSIS — G5603 Carpal tunnel syndrome, bilateral upper limbs: Secondary | ICD-10-CM

## 2024-01-14 NOTE — Progress Notes (Signed)
   Kim Watson - 53 y.o. female MRN [id number]  Date of birth: 11-02-1971  Office Visit Note: Visit Date: 01/14/2024 PCP: Gerre Scull, NP Referred by: Gerre Scull, NP  Subjective:  HPI: Kim Watson is a 53 y.o. female who presents today for follow up 2 weeks status post left wrist open carpal tunnel release.  She is doing well status post carpal tunnel release, numbness and tingling is improving in the left hand.  Today, she is describing increasing pain at the right thumb CMC interval.  She does have a known history of prior CMC arthritis of the right thumb which has been treated conservatively with bracing so far.  No prior injections.  Pertinent ROS were reviewed with the patient and found to be negative unless otherwise specified above in HPI.   Assessment & Plan: Visit Diagnoses:  1. Arthritis of carpometacarpal (CMC) joint of right thumb   2. Carpal tunnel syndrome, bilateral     Plan: I am pleased to see that she is healing well from the left carpal tunnel release.  Given that she has been primarily utilizing her right hand over the past couple weeks, she feels this may have exacerbated her right thumb CMC arthritis which is previously known.  New x-rays were taken today which do show some progression of the [facility] arthritis from her most recent x-rays taken over 1 year prior.  We discussed etiology and pathophysiology of CMC arthritis of the thumb as well as treat modalities ranging from conservative to surgical.  From a conservative standpoint, we discussed ongoing bracing, augmentation with cortisone injection, activity modification, over-the-counter anti-inflammatory medicine both oral and topical.  We also discussed the possibility for [facility] arthroplasty in the future should symptoms remain refractory to conservative care.  Understanding the above, she would like to proceed with an injection of cortisone to the right thumb CMC interval today which was performed.  Patient  tolerated injection well, all risks and benefits were described to the patient prior to injection.  I will have her follow-up with myself in approximate 6 weeks time to recheck both the carpal tunnel as well as the right thumb CMC arthritis.  She does have a [employer]fort Cool brace which she utilizes and have encouraged her to keep using.  Follow-up: No follow-ups on file.   Meds & Orders: No orders of the defined types were placed in this encounter.   Orders Placed This Encounter  Procedures   Hand/UE Inj   XR Wrist Complete Right   Ambulatory referral to Occupational Therapy     Procedures: Hand/UE Inj: R thumb CMC for osteoarthritis on 01/16/2024 8:59 AM Details: 25 G needle Medications: 1 mL lidocaine 1 %; 6 mg betamethasone acetate-betamethasone sodium phosphate 6 (3-3) MG/ML Outcome: tolerated well, no immediate complications Procedure, treatment alternatives, risks and benefits explained, specific risks discussed.           Objective:   Vital Signs: There were no vitals taken for this visit.  Ortho Exam Left hand: - Well-healing palmar incision, sutures removed today, skin edges well-approximated without erythema or drainage - Composite fist without restriction - Sensation intact to light touch in all distributions including median nerve - 5/5 APB, no significant thenar atrophy   Imaging: X-rays of the right wrist including pantrapezial view taken today   Arrion Burruel Fara Boros) Denese Killings, M.D. [facility]

## 2024-01-16 DIAGNOSIS — M1811 Unilateral primary osteoarthritis of first carpometacarpal joint, right hand: Secondary | ICD-10-CM | POA: Diagnosis not present

## 2024-01-16 MED ORDER — LIDOCAINE HCL 1 % IJ SOLN
1.0000 mL | INTRAMUSCULAR | Status: AC | PRN
  Administered 2024-01-16: 1 mL

## 2024-01-16 MED ORDER — BETAMETHASONE SOD PHOS & ACET 6 (3-3) MG/ML IJ SUSP
6.0000 mg | INTRAMUSCULAR | Status: AC | PRN
Start: 2024-01-16 — End: 2024-01-16
  Administered 2024-01-16: 6 mg via INTRA_ARTICULAR

## 2024-01-17 ENCOUNTER — Emergency Department (HOSPITAL_BASED_OUTPATIENT_CLINIC_OR_DEPARTMENT_OTHER): Payer: Medicaid Other

## 2024-01-17 ENCOUNTER — Emergency Department (HOSPITAL_BASED_OUTPATIENT_CLINIC_OR_DEPARTMENT_OTHER)
Admission: EM | Admit: 2024-01-17 | Discharge: 2024-01-17 | Disposition: A | Discharge status: Home / Self Care | Payer: Medicaid Other | Attending: Emergency Medicine | Admitting: Emergency Medicine

## 2024-01-17 ENCOUNTER — Other Ambulatory Visit: Payer: Self-pay

## 2024-01-17 ENCOUNTER — Encounter (HOSPITAL_BASED_OUTPATIENT_CLINIC_OR_DEPARTMENT_OTHER): Payer: Self-pay

## 2024-01-17 DIAGNOSIS — M5030 Other cervical disc degeneration, unspecified cervical region: Secondary | ICD-10-CM | POA: Diagnosis not present

## 2024-01-17 DIAGNOSIS — I10 Essential (primary) hypertension: Secondary | ICD-10-CM | POA: Diagnosis not present

## 2024-01-17 DIAGNOSIS — M4802 Spinal stenosis, cervical region: Secondary | ICD-10-CM | POA: Diagnosis not present

## 2024-01-17 DIAGNOSIS — Z79899 Other long term (current) drug therapy: Secondary | ICD-10-CM | POA: Diagnosis not present

## 2024-01-17 DIAGNOSIS — S20212A Contusion of left front wall of thorax, initial encounter: Secondary | ICD-10-CM | POA: Insufficient documentation

## 2024-01-17 DIAGNOSIS — Y92410 Unspecified street and highway as the place of occurrence of the external cause: Secondary | ICD-10-CM | POA: Diagnosis not present

## 2024-01-17 DIAGNOSIS — S80811A Abrasion, right lower leg, initial encounter: Secondary | ICD-10-CM | POA: Insufficient documentation

## 2024-01-17 DIAGNOSIS — R079 Chest pain, unspecified: Secondary | ICD-10-CM | POA: Diagnosis not present

## 2024-01-17 DIAGNOSIS — M47812 Spondylosis without myelopathy or radiculopathy, cervical region: Secondary | ICD-10-CM | POA: Diagnosis not present

## 2024-01-17 DIAGNOSIS — S161XXA Strain of muscle, fascia and tendon at neck level, initial encounter: Secondary | ICD-10-CM | POA: Insufficient documentation

## 2024-01-17 DIAGNOSIS — R9431 Abnormal electrocardiogram [ECG] [EKG]: Secondary | ICD-10-CM | POA: Diagnosis not present

## 2024-01-17 DIAGNOSIS — M542 Cervicalgia: Secondary | ICD-10-CM | POA: Diagnosis present

## 2024-01-17 DIAGNOSIS — S199XXA Unspecified injury of neck, initial encounter: Secondary | ICD-10-CM | POA: Diagnosis not present

## 2024-01-17 DIAGNOSIS — S0990XA Unspecified injury of head, initial encounter: Secondary | ICD-10-CM | POA: Diagnosis not present

## 2024-01-17 MED ORDER — CYCLOBENZAPRINE HCL 10 MG PO TABS: 10.0000 mg | ORAL_TABLET | Freq: Two times a day (BID) | ORAL | 0 refills | Status: AC | PRN

## 2024-01-17 MED ORDER — ACETAMINOPHEN 500 MG PO TABS
1000.0000 mg | ORAL_TABLET | Freq: Once | ORAL | Status: AC
  Administered 2024-01-17: 1000 mg via ORAL
  Filled 2024-01-17: qty 2
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
## 2024-01-18 ENCOUNTER — Ambulatory Visit: Payer: Medicaid Other | Admitting: Occupational Therapy

## 2024-01-18 ENCOUNTER — Telehealth: Payer: Self-pay

## 2024-01-18 ENCOUNTER — Other Ambulatory Visit: Payer: Self-pay | Admitting: Nurse Practitioner

## 2024-01-18 NOTE — Transitions of Care (Post Inpatient/ED Visit) (Signed)
   01/18/2024  Name: Kim Watson MRN: [id number] DOB: 1971/02/02  Today's TOC FU Call Status: Today's TOC FU Call Status:: Successful TOC FU Call Completed TOC FU Call Complete Date: 01/18/24 Patient's Name and Date of Birth confirmed.  Transition Care Management Follow-up Telephone Call Date of Discharge: 01/18/24 Discharge Facility: [facility] ([facility]) Type of Discharge: Emergency Department Reason for ED Visit: Other: How have you been since you were released from the hospital?: Same Any questions or concerns?: No  Items Reviewed: Did you receive and understand the discharge instructions provided?: Yes Medications obtained,verified, and reconciled?: Yes (Medications Reviewed) Any new allergies since your discharge?: No Dietary orders reviewed?: NA Do you have support at home?: Yes People in Home: spouse  Medications Reviewed Today: Medications Reviewed Today   Medications were not reviewed in this encounter     Home Care and Equipment/Supplies: Were Home Health Services Ordered?: NA Any new equipment or medical supplies ordered?: NA  Functional Questionnaire: Do you need assistance with bathing/showering or dressing?: Yes Do you need assistance with meal preparation?: Yes Do you need assistance with eating?: No Do you have difficulty maintaining continence: No Do you need assistance with getting out of bed/getting out of a chair/moving?: Yes Do you have difficulty managing or taking your medications?: No  Follow up appointments reviewed: PCP Follow-up appointment confirmed?: No MD Provider Line [phone] Given: Yes Specialist Hospital Follow-up appointment confirmed?: NA Do you need transportation to your follow-up appointment?: No Do you understand care options if your condition(s) worsen?: Yes-patient verbalized understanding    SIGNATURE Georgina Krist D, CMA

## 2024-01-18 NOTE — Therapy (Incomplete)
OUTPATIENT OCCUPATIONAL THERAPY ORTHO EVALUATION  Patient Name: Kim Watson MRN: [id number] DOB:Jan 09, 1971, 53 y.o., female Today's Date: 01/18/2024  PCP: *** REFERRING PROVIDER: ***  END OF SESSION:   Past Medical History:  Diagnosis Date   Anxiety    Arthritis    Depression    Gallbladder problem    GERD (gastroesophageal reflux disease)    High blood pressure    Hypertension    Migraine    Obesity    Obstructive sleep apnea    2016 was cleared from having to use CPAP at night. Gastric bypass surgery 2015.   Vitamin D deficiency    Past Surgical History:  Procedure Laterality Date   ANTERIOR CERVICAL DECOMP/DISCECTOMY FUSION N/A 10/02/2022   Procedure: ANTERIOR CERVICAL DISCECTOMY AND FUSION CERVICAL FOUR TO SEVEN;  Surgeon: Venita Lick, MD;  Location: [facility] OR;  Service: Orthopedics;  Laterality: N/A;  4hrs 3 C-Bed   CHOLECYSTECTOMY     FOOT SURGERY Left 07/26/2022   5th metatarsal   SLEEVE GASTROPLASTY     TUBAL LIGATION     Patient Active Problem List   Diagnosis Date Noted   Vaginal dryness 01/05/2024   Menopausal and female climacteric states 01/05/2024   Major depressive disorder, recurrent, mild (HCC) 01/05/2024   Low libido 01/05/2024   Other fatigue 01/05/2024   Chronic fatigue 01/05/2024   Arthralgia 01/05/2024   Anxiety 01/05/2024   S/P laparoscopic sleeve gastrectomy 10/20/2023   Neuropathic pain 10/12/2023   Pre-procedure lab exam 10/02/2023   Lumbar radiculopathy 09/22/2023   Hot flashes 09/10/2023   Primary insomnia 09/10/2023   Bronchitis 05/15/2023   Lumbar pain 04/20/2023   Back pain 04/14/2023   Symptomatic mammary hypertrophy 04/14/2023   Bilateral carpal tunnel syndrome 12/12/2022   Tendonitis 12/12/2022   Cervical myelopathy (HCC) 10/02/2022   Preop examination 09/16/2022   COVID-19 08/20/2022   Acute nonintractable headache 08/20/2022   Pain in right shoulder 06/27/2022   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/07/2022   Primary hypertension  05/07/2022   Neck pain 05/07/2022   Acute left-sided low back pain without sciatica 02/24/2022   Bursitis of left shoulder 01/10/2022   Dry mouth 01/10/2022   History of bariatric surgery 01/10/2022   Elevated blood pressure reading 01/10/2022   Anxiety and depression 01/10/2022   Gastroesophageal reflux disease 01/10/2022   Pain in throat 07/18/2020   Spondylolisthesis at L4-L5 level 01/27/2020   Chronic migraine without aura without status migrainosus, not intractable 08/31/2018   Recurrent oral herpes simplex 08/31/2018   Environmental and seasonal allergies 08/31/2018   Herpes simplex 05/21/2018   Glenohumeral arthritis, left 05/13/2018   Iron deficiency 03/26/2018   History of colonic polyps 12/23/2017   Prediabetes 07/13/2017   At risk for diabetes mellitus 07/02/2017   Insulin resistance 07/02/2017   Vitamin D deficiency 07/02/2017   Other hyperlipidemia 07/02/2017   Metabolic syndrome 07/02/2017   Morbid obesity (HCC) 06/18/2017   Moderate recurrent major depression (HCC) 01/02/2017   History of sleeve gastrectomy 02/27/2016   Chronic pain of both shoulders 11/02/2014   Arthritis of both knees 10/04/2014   OSA (obstructive sleep apnea) 01/30/2014    ONSET DATE: ***  REFERRING DIAG: ***  THERAPY DIAG:  No diagnosis found.  Rationale for Evaluation and Treatment: {HABREHAB:27488}  SUBJECTIVE:   SUBJECTIVE STATEMENT: *** Pt accompanied by: {accompnied:27141}  PERTINENT HISTORY: ***  PRECAUTIONS: {Therapy precautions:24002}  RED FLAGS: {PT Red Flags:29287}   WEIGHT BEARING RESTRICTIONS: {Yes ***/No:24003}  PAIN:  Are you having pain? {OPRCPAIN:27236}  FALLS: Has  patient fallen in last 6 months? {fallsyesno:27318}  LIVING ENVIRONMENT: Lives with: {OPRC lives with:25569::"lives with their family"} Lives in: {Lives in:25570} Stairs: {opstairs:27293} Has following equipment at home: {Assistive devices:23999}  PLOF: {PLOF:24004}  PATIENT GOALS:  ***  NEXT MD VISIT: ***  OBJECTIVE:  Note: Objective measures were completed at Evaluation unless otherwise noted.  HAND DOMINANCE: {MISC; OT HAND [phone]}  ADLs: {ADLs OT:31716}  FUNCTIONAL OUTCOME MEASURES: {OTFUNCTIONALMEASURES:27238}  UPPER EXTREMITY ROM:     {AROM/PROM:27142} ROM Right eval Left eval  Shoulder flexion    Shoulder abduction    Shoulder adduction    Shoulder extension    Shoulder internal rotation    Shoulder external rotation    Elbow flexion    Elbow extension    Wrist flexion    Wrist extension    Wrist ulnar deviation    Wrist radial deviation    Wrist pronation    Wrist supination    (Blank rows = not tested)  {AROM/PROM:27142} ROM Right eval Left eval  Thumb MCP (0-60)    Thumb IP (0-80)    Thumb Radial abd/add (0-55)     Thumb Palmar abd/add (0-45)     Thumb Opposition to Small Finger     Index MCP (0-90)     Index PIP (0-100)     Index DIP (0-70)      Long MCP (0-90)      Long PIP (0-100)      Long DIP (0-70)      Ring MCP (0-90)      Ring PIP (0-100)      Ring DIP (0-70)      Little MCP (0-90)      Little PIP (0-100)      Little DIP (0-70)      (Blank rows = not tested)   UPPER EXTREMITY MMT:     MMT Right eval Left eval  Shoulder flexion    Shoulder abduction    Shoulder adduction    Shoulder extension    Shoulder internal rotation    Shoulder external rotation    Middle trapezius    Lower trapezius    Elbow flexion    Elbow extension    Wrist flexion    Wrist extension    Wrist ulnar deviation    Wrist radial deviation    Wrist pronation    Wrist supination    (Blank rows = not tested)  HAND FUNCTION: {handfunction:27230}  COORDINATION: {otcoordination:27237}  SENSATION: {sensation:27233}  EDEMA: ***  COGNITION: Overall cognitive status: {cognition:24006} Areas of impairment: {impairedcognition:27234}  OBSERVATIONS: ***   TREATMENT DATE: ***                                                                                                                             Modalities: {OPRCMODALITIES:31717}     PATIENT EDUCATION: Education details: *** Person educated: {Person educated:25204} Education method: {Education Method:25205} Education comprehension: {Education Comprehension:25206}  HOME EXERCISE PROGRAM: ***  GOALS:  Goals reviewed with patient? {yes/no:20286}  SHORT TERM GOALS: Target date: ***  *** Baseline: Goal status: INITIAL  2.  *** Baseline:  Goal status: INITIAL  3.  *** Baseline:  Goal status: INITIAL  4.  *** Baseline:  Goal status: INITIAL  5.  *** Baseline:  Goal status: INITIAL  6.  *** Baseline:  Goal status: INITIAL  LONG TERM GOALS: Target date: ***  *** Baseline:  Goal status: INITIAL  2.  *** Baseline:  Goal status: INITIAL  3.  *** Baseline:  Goal status: INITIAL  4.  *** Baseline:  Goal status: INITIAL  5.  *** Baseline:  Goal status: INITIAL  6.  *** Baseline:  Goal status: INITIAL  ASSESSMENT:  CLINICAL IMPRESSION: Patient is a *** y.o. *** who was seen today for occupational therapy evaluation for ***.   PERFORMANCE DEFICITS: in functional skills including {OT physical skills:25468}, cognitive skills including {OT cognitive skills:25469}, and psychosocial skills including {OT psychosocial skills:25470}.   IMPAIRMENTS: are limiting patient from {OT performance deficits:25471}.   COMORBIDITIES: {Comorbidities:25485} that affects occupational performance. Patient will benefit from skilled OT to address above impairments and improve overall function.  MODIFICATION OR ASSISTANCE TO COMPLETE EVALUATION: {OT modification:25474}  OT OCCUPATIONAL PROFILE AND HISTORY: {OT PROFILE AND HISTORY:25484}  CLINICAL DECISION MAKING: {OT CDM:25475}  REHAB POTENTIAL: {rehabpotential:25112}  EVALUATION COMPLEXITY: {Evaluation complexity:25115}      PLAN:  OT FREQUENCY: {rehab  frequency:25116}  OT DURATION: {rehab duration:25117}  PLANNED INTERVENTIONS: {OT Interventions:25467}  RECOMMENDED OTHER SERVICES: ***  CONSULTED AND AGREED WITH PLAN OF CARE: {ZOX:09604}  PLAN FOR NEXT SESSION: ***   Sheran Lawless, OT 01/18/2024, 9:41 AM

## 2024-01-21 ENCOUNTER — Ambulatory Visit: Payer: Medicaid Other | Admitting: Dermatology

## 2024-01-25 DIAGNOSIS — Z9884 Bariatric surgery status: Secondary | ICD-10-CM | POA: Diagnosis not present

## 2024-01-25 DIAGNOSIS — K219 Gastro-esophageal reflux disease without esophagitis: Secondary | ICD-10-CM | POA: Diagnosis not present

## 2024-01-25 DIAGNOSIS — Z6841 Body Mass Index (BMI) 40.0 and over, adult: Secondary | ICD-10-CM | POA: Diagnosis not present

## 2024-01-25 DIAGNOSIS — Z713 Dietary counseling and surveillance: Secondary | ICD-10-CM | POA: Diagnosis not present

## 2024-01-28 DIAGNOSIS — F32A Depression, unspecified: Secondary | ICD-10-CM | POA: Diagnosis not present

## 2024-01-28 DIAGNOSIS — F418 Other specified anxiety disorders: Secondary | ICD-10-CM | POA: Diagnosis not present

## 2024-02-01 DIAGNOSIS — M545 Low back pain, unspecified: Secondary | ICD-10-CM | POA: Diagnosis not present

## 2024-02-01 DIAGNOSIS — F0781 Postconcussional syndrome: Secondary | ICD-10-CM | POA: Diagnosis not present

## 2024-02-01 DIAGNOSIS — M542 Cervicalgia: Secondary | ICD-10-CM | POA: Diagnosis not present

## 2024-02-01 DIAGNOSIS — M7918 Myalgia, other site: Secondary | ICD-10-CM | POA: Diagnosis not present

## 2024-02-02 ENCOUNTER — Ambulatory Visit: Payer: Medicaid Other | Attending: Orthopedic Surgery | Admitting: Occupational Therapy

## 2024-02-02 ENCOUNTER — Other Ambulatory Visit: Payer: Self-pay

## 2024-02-02 ENCOUNTER — Other Ambulatory Visit: Payer: Self-pay | Admitting: Nurse Practitioner

## 2024-02-02 DIAGNOSIS — M5459 Other low back pain: Secondary | ICD-10-CM | POA: Insufficient documentation

## 2024-02-02 DIAGNOSIS — M6281 Muscle weakness (generalized): Secondary | ICD-10-CM | POA: Diagnosis not present

## 2024-02-02 DIAGNOSIS — M7552 Bursitis of left shoulder: Secondary | ICD-10-CM | POA: Insufficient documentation

## 2024-02-02 DIAGNOSIS — G5603 Carpal tunnel syndrome, bilateral upper limbs: Secondary | ICD-10-CM | POA: Insufficient documentation

## 2024-02-02 DIAGNOSIS — R278 Other lack of coordination: Secondary | ICD-10-CM | POA: Diagnosis not present

## 2024-02-02 DIAGNOSIS — R41841 Cognitive communication deficit: Secondary | ICD-10-CM | POA: Insufficient documentation

## 2024-02-02 DIAGNOSIS — R29898 Other symptoms and signs involving the musculoskeletal system: Secondary | ICD-10-CM | POA: Insufficient documentation

## 2024-02-02 DIAGNOSIS — R208 Other disturbances of skin sensation: Secondary | ICD-10-CM | POA: Diagnosis not present

## 2024-02-02 DIAGNOSIS — M542 Cervicalgia: Secondary | ICD-10-CM | POA: Insufficient documentation

## 2024-02-02 DIAGNOSIS — S060X0D Concussion without loss of consciousness, subsequent encounter: Secondary | ICD-10-CM | POA: Diagnosis not present

## 2024-02-02 DIAGNOSIS — R519 Headache, unspecified: Secondary | ICD-10-CM | POA: Diagnosis not present
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
## 2024-02-02 NOTE — Patient Instructions (Signed)
   WEBSITE: [email or web address]

## 2024-02-03 ENCOUNTER — Encounter (INDEPENDENT_AMBULATORY_CARE_PROVIDER_SITE_OTHER): Payer: Self-pay

## 2024-02-03 NOTE — Telephone Encounter (Signed)
 Requesting: LOSARTAN/HCTZ 50/12.5MG  TABLETS  Last Visit: 09/22/2023 Next Visit: 02/04/2024 Last Refill: 01/18/2024  Please Advise

## 2024-02-04 ENCOUNTER — Telehealth: Payer: Self-pay | Admitting: Occupational Therapy

## 2024-02-04 ENCOUNTER — Ambulatory Visit: Payer: Self-pay | Admitting: Occupational Therapy

## 2024-02-04 ENCOUNTER — Ambulatory Visit: Admitting: Nurse Practitioner

## 2024-02-04 ENCOUNTER — Encounter: Payer: Self-pay | Admitting: Nurse Practitioner

## 2024-02-04 VITALS — BP 138/86 | HR 85 | Temp 97.9°F | Ht 66.0 in | Wt 290.8 lb

## 2024-02-04 DIAGNOSIS — M542 Cervicalgia: Secondary | ICD-10-CM

## 2024-02-04 DIAGNOSIS — S060X0D Concussion without loss of consciousness, subsequent encounter: Secondary | ICD-10-CM

## 2024-02-04 DIAGNOSIS — S060X0A Concussion without loss of consciousness, initial encounter: Secondary | ICD-10-CM | POA: Insufficient documentation

## 2024-02-04 DIAGNOSIS — M5416 Radiculopathy, lumbar region: Secondary | ICD-10-CM

## 2024-02-04 DIAGNOSIS — R208 Other disturbances of skin sensation: Secondary | ICD-10-CM | POA: Diagnosis not present

## 2024-02-04 DIAGNOSIS — R41841 Cognitive communication deficit: Secondary | ICD-10-CM | POA: Diagnosis not present

## 2024-02-04 DIAGNOSIS — M7552 Bursitis of left shoulder: Secondary | ICD-10-CM

## 2024-02-04 DIAGNOSIS — B029 Zoster without complications: Secondary | ICD-10-CM

## 2024-02-04 DIAGNOSIS — G5603 Carpal tunnel syndrome, bilateral upper limbs: Secondary | ICD-10-CM

## 2024-02-04 DIAGNOSIS — M6281 Muscle weakness (generalized): Secondary | ICD-10-CM | POA: Diagnosis not present

## 2024-02-04 DIAGNOSIS — R278 Other lack of coordination: Secondary | ICD-10-CM

## 2024-02-04 DIAGNOSIS — M5459 Other low back pain: Secondary | ICD-10-CM | POA: Diagnosis not present

## 2024-02-04 DIAGNOSIS — R519 Headache, unspecified: Secondary | ICD-10-CM | POA: Diagnosis not present

## 2024-02-04 DIAGNOSIS — G8929 Other chronic pain: Secondary | ICD-10-CM

## 2024-02-04 DIAGNOSIS — R29898 Other symptoms and signs involving the musculoskeletal system: Secondary | ICD-10-CM

## 2024-02-04 MED ORDER — GABAPENTIN 300 MG PO CAPS: 300.0000 mg | ORAL_CAPSULE | Freq: Three times a day (TID) | ORAL | 0 refills | Status: DC

## 2024-02-04 MED ORDER — VALACYCLOVIR HCL 1 G PO TABS: 1000.0000 mg | ORAL_TABLET | Freq: Three times a day (TID) | ORAL | 0 refills | Status: AC
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
## 2024-02-04 NOTE — Telephone Encounter (Signed)
 I called and spoke with patient and notified her that a referral was placed regarding her concerns today at OT and that someone will be in contact with her in the next week or 2 to get scheduled.

## 2024-02-04 NOTE — Telephone Encounter (Signed)
 Good afternoon,  Pt was seen by OT today. The patient would benefit from SLP evaluation d/t pt expressing concerns about memory, recall, and word finding difficulties.  If you agree, please place an order in [facility] workque in [facility] or fax the order to [phone].  Thank you, Carilyn Goodpasture, OTR/L  [facility] [address] [place], [place]  [address] Phone:  [phone] Fax:  [phone]

## 2024-02-04 NOTE — Patient Instructions (Signed)
 It was great to see you!  I have placed a referral to PT  I have ordered a MRI of your back  Start valtrex 1 tablet 3 times a day for 7 days  Increase your gabapentin to 1 capsule 3 times a day  Let's follow-up in 3 months, sooner if you have concerns.  If a referral was placed today, you will be contacted for an appointment. Please note that routine referrals can sometimes take up to 3-4 weeks to process. Please call our office if you haven't heard anything after this time frame.  Take care,  Rodman Pickle, NP

## 2024-02-04 NOTE — Assessment & Plan Note (Signed)
 She is experiencing post-concussion syndrome with cognitive, memory, and balance issues affecting daily function. Monitor symptoms and provide supportive care.
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
## 2024-02-09 ENCOUNTER — Ambulatory Visit: Payer: Self-pay | Admitting: Occupational Therapy

## 2024-02-09 DIAGNOSIS — M6281 Muscle weakness (generalized): Secondary | ICD-10-CM | POA: Diagnosis not present

## 2024-02-09 DIAGNOSIS — S060X0D Concussion without loss of consciousness, subsequent encounter: Secondary | ICD-10-CM | POA: Diagnosis not present

## 2024-02-09 DIAGNOSIS — R208 Other disturbances of skin sensation: Secondary | ICD-10-CM | POA: Diagnosis not present

## 2024-02-09 DIAGNOSIS — R519 Headache, unspecified: Secondary | ICD-10-CM | POA: Diagnosis not present

## 2024-02-09 DIAGNOSIS — M7552 Bursitis of left shoulder: Secondary | ICD-10-CM | POA: Diagnosis not present

## 2024-02-09 DIAGNOSIS — R278 Other lack of coordination: Secondary | ICD-10-CM | POA: Diagnosis not present

## 2024-02-09 DIAGNOSIS — R41841 Cognitive communication deficit: Secondary | ICD-10-CM | POA: Diagnosis not present

## 2024-02-09 DIAGNOSIS — G5603 Carpal tunnel syndrome, bilateral upper limbs: Secondary | ICD-10-CM

## 2024-02-09 DIAGNOSIS — M542 Cervicalgia: Secondary | ICD-10-CM | POA: Diagnosis not present

## 2024-02-09 DIAGNOSIS — R29898 Other symptoms and signs involving the musculoskeletal system: Secondary | ICD-10-CM | POA: Diagnosis not present

## 2024-02-09 DIAGNOSIS — M5459 Other low back pain: Secondary | ICD-10-CM | POA: Diagnosis not present

## 2024-02-09 NOTE — Patient Instructions (Addendum)
 WEBSITE: [email or web address]    Memory Compensation Strategies  Use "WARM" strategy. W= write it down A=  associate it R=  repeat it M=  make a mental picture  You can keep a Memory Notebook. Use a 3-ring notebook with sections for the following:  calendar, important names and phone numbers, medications, doctors' names/phone numbers, "to do list"/reminders, and a section to journal what you did each day  Use a calendar to write appointments down.  Write yourself a schedule for the day.  This can be placed on the calendar or in a separate section of the Memory Notebook.  Keeping a regular schedule can help memory.  Use medication organizer with sections for each day or morning/evening pills  You may need help loading it  Keep a basket, or pegboard by the door.   Place items that you need to take out with you in the basket or on the pegboard.  You may also want to include a message board for reminders.  Use sticky notes. Place sticky notes with reminders in a place where the task is performed.  For example:  "turn off the stove" placed by the stove, "lock the door" placed on the door at eye level, "take your medications" on the bathroom mirror or by the place where you normally take your medications  Use alarms, timers, and/or a reminder app. Use while cooking to remind yourself to check on food or as a reminder to take your medicine, or as a reminder to make a call, or as a reminder to perform another task, etc.  Use a voice recorder app or small tape recorder to record important information and notes for yourself. Go back at the end of the day and listen to these.
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
## 2024-02-10 ENCOUNTER — Ambulatory Visit

## 2024-02-10 ENCOUNTER — Other Ambulatory Visit: Payer: Self-pay

## 2024-02-10 ENCOUNTER — Ambulatory Visit: Admitting: Occupational Therapy

## 2024-02-10 DIAGNOSIS — R278 Other lack of coordination: Secondary | ICD-10-CM

## 2024-02-10 DIAGNOSIS — M5459 Other low back pain: Secondary | ICD-10-CM

## 2024-02-10 DIAGNOSIS — M7552 Bursitis of left shoulder: Secondary | ICD-10-CM | POA: Diagnosis not present

## 2024-02-10 DIAGNOSIS — S060X0D Concussion without loss of consciousness, subsequent encounter: Secondary | ICD-10-CM | POA: Diagnosis not present

## 2024-02-10 DIAGNOSIS — M6281 Muscle weakness (generalized): Secondary | ICD-10-CM | POA: Diagnosis not present

## 2024-02-10 DIAGNOSIS — R29898 Other symptoms and signs involving the musculoskeletal system: Secondary | ICD-10-CM | POA: Diagnosis not present

## 2024-02-10 DIAGNOSIS — M542 Cervicalgia: Secondary | ICD-10-CM

## 2024-02-10 DIAGNOSIS — R519 Headache, unspecified: Secondary | ICD-10-CM | POA: Diagnosis not present

## 2024-02-10 DIAGNOSIS — R208 Other disturbances of skin sensation: Secondary | ICD-10-CM | POA: Diagnosis not present

## 2024-02-10 DIAGNOSIS — G5603 Carpal tunnel syndrome, bilateral upper limbs: Secondary | ICD-10-CM | POA: Diagnosis not present

## 2024-02-10 DIAGNOSIS — R41841 Cognitive communication deficit: Secondary | ICD-10-CM | POA: Diagnosis not present
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
## 2024-02-12 ENCOUNTER — Encounter: Payer: Self-pay | Admitting: Occupational Therapy
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
## 2024-02-16 ENCOUNTER — Ambulatory Visit: Admitting: Physical Therapy

## 2024-02-16 ENCOUNTER — Ambulatory Visit

## 2024-02-16 ENCOUNTER — Ambulatory Visit: Payer: Self-pay | Admitting: Occupational Therapy

## 2024-02-16 ENCOUNTER — Encounter: Payer: Self-pay | Admitting: Physical Therapy

## 2024-02-16 DIAGNOSIS — M6281 Muscle weakness (generalized): Secondary | ICD-10-CM

## 2024-02-16 DIAGNOSIS — R41841 Cognitive communication deficit: Secondary | ICD-10-CM | POA: Diagnosis not present

## 2024-02-16 DIAGNOSIS — R29898 Other symptoms and signs involving the musculoskeletal system: Secondary | ICD-10-CM | POA: Diagnosis not present

## 2024-02-16 DIAGNOSIS — M7552 Bursitis of left shoulder: Secondary | ICD-10-CM | POA: Diagnosis not present

## 2024-02-16 DIAGNOSIS — M5459 Other low back pain: Secondary | ICD-10-CM

## 2024-02-16 DIAGNOSIS — R208 Other disturbances of skin sensation: Secondary | ICD-10-CM | POA: Diagnosis not present

## 2024-02-16 DIAGNOSIS — R278 Other lack of coordination: Secondary | ICD-10-CM | POA: Diagnosis not present

## 2024-02-16 DIAGNOSIS — G5603 Carpal tunnel syndrome, bilateral upper limbs: Secondary | ICD-10-CM

## 2024-02-16 DIAGNOSIS — S060X0D Concussion without loss of consciousness, subsequent encounter: Secondary | ICD-10-CM | POA: Diagnosis not present

## 2024-02-16 DIAGNOSIS — M542 Cervicalgia: Secondary | ICD-10-CM | POA: Diagnosis not present

## 2024-02-16 DIAGNOSIS — R519 Headache, unspecified: Secondary | ICD-10-CM | POA: Diagnosis not present

## 2024-02-16 NOTE — Patient Instructions (Signed)
 Access Code: [email or web address] URL: [email or web address] Date: 02/16/2024 Prepared by: Amada Kingfisher  Exercises - Putty Squeezes  - 1-2 x daily - 10 reps - Rolling Putty on Table  - 1-2 x daily - 10 reps - Finger Pinch and Pull with Putty  - 1-2 x daily - 10 reps - 3-Point Pinch with Putty  - 1-2 x daily - 10 reps - Tip PUSH with Putty  - 1-2 x daily - 10 reps - Key Pinch with Putty  - 1-2 x daily - 10 reps - Finger Extension with Putty  - 1-2 x daily - 10 reps - Finger Adduction with Putty  - 1-2 x daily - 10 reps - Removing Marbles from Putty  - 1-2 x daily - 10 reps

## 2024-02-16 NOTE — Therapy (Signed)
 OUTPATIENT PHYSICAL THERAPY THORACOLUMBAR EVALUATION   Patient Name: Kim Watson MRN: [id number] DOB:02/12/1971, 53 y.o., female Today's Date: 02/16/2024  END OF SESSION:  PT End of Session - 02/16/24 1106     Visit Number 2    Number of Visits 9    Date for PT Re-Evaluation 03/09/24    Authorization Type [employer]    PT Start Time 1105   handoff from ST   PT Stop Time 1143    PT Time Calculation (min) 38 min    Activity Tolerance Patient tolerated treatment well    Behavior During Therapy WFL for tasks assessed/performed             Past Medical History:  Diagnosis Date   Anxiety    Arthritis    Depression    Gallbladder problem    GERD (gastroesophageal reflux disease)    High blood pressure    Hypertension    Migraine    Obesity    Obstructive sleep apnea    2016 was cleared from having to use CPAP at night. Gastric bypass surgery 2015.   Vitamin D deficiency    Past Surgical History:  Procedure Laterality Date   ANTERIOR CERVICAL DECOMP/DISCECTOMY FUSION N/A 10/02/2022   Procedure: ANTERIOR CERVICAL DISCECTOMY AND FUSION CERVICAL FOUR TO SEVEN;  Surgeon: Venita Lick, MD;  Location: [facility] OR;  Service: Orthopedics;  Laterality: N/A;  4hrs 3 C-Bed   CHOLECYSTECTOMY     FOOT SURGERY Left 07/26/2022   5th metatarsal   SLEEVE GASTROPLASTY     TUBAL LIGATION     Patient Active Problem List   Diagnosis Date Noted   Concussion with no loss of consciousness 02/04/2024   Vaginal dryness 01/05/2024   Menopausal and female climacteric states 01/05/2024   Major depressive disorder, recurrent, mild (HCC) 01/05/2024   Low libido 01/05/2024   Other fatigue 01/05/2024   Chronic fatigue 01/05/2024   Arthralgia 01/05/2024   Anxiety 01/05/2024   S/P laparoscopic sleeve gastrectomy 10/20/2023   Neuropathic pain 10/12/2023   Pre-procedure lab exam 10/02/2023   Lumbar radiculopathy 09/22/2023   Hot flashes 09/10/2023   Primary insomnia 09/10/2023   Bronchitis  05/15/2023   Lumbar pain 04/20/2023   Back pain 04/14/2023   Symptomatic mammary hypertrophy 04/14/2023   Bilateral carpal tunnel syndrome 12/12/2022   Tendonitis 12/12/2022   Cervical myelopathy (HCC) 10/02/2022   Preop examination 09/16/2022   COVID-19 08/20/2022   Acute nonintractable headache 08/20/2022   Pain in right shoulder 06/27/2022   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/07/2022   Primary hypertension 05/07/2022   Chronic neck pain 05/07/2022   Acute left-sided low back pain without sciatica 02/24/2022   Bursitis of left shoulder 01/10/2022   Dry mouth 01/10/2022   History of bariatric surgery 01/10/2022   Elevated blood pressure reading 01/10/2022   Anxiety and depression 01/10/2022   Gastroesophageal reflux disease 01/10/2022   Pain in throat 07/18/2020   Spondylolisthesis at L4-L5 level 01/27/2020   Chronic migraine without aura without status migrainosus, not intractable 08/31/2018   Recurrent oral herpes simplex 08/31/2018   Environmental and seasonal allergies 08/31/2018   Herpes simplex 05/21/2018   Glenohumeral arthritis, left 05/13/2018   Iron deficiency 03/26/2018   History of colonic polyps 12/23/2017   Prediabetes 07/13/2017   At risk for diabetes mellitus 07/02/2017   Insulin resistance 07/02/2017   Vitamin D deficiency 07/02/2017   Other hyperlipidemia 07/02/2017   Metabolic syndrome 07/02/2017   Morbid obesity (HCC) 06/18/2017   Moderate recurrent major depression (HCC)  01/02/2017   History of sleeve gastrectomy 02/27/2016   Chronic pain of both shoulders 11/02/2014   Arthritis of both knees 10/04/2014   OSA (obstructive sleep apnea) 01/30/2014    PCP: Gerre Scull, NP  REFERRING PROVIDER: Gerre Scull, NP  REFERRING DIAG: M54.16 (ICD-10-CM) - Lumbar radiculopathy M54.2,G89.29 (ICD-10-CM) - Chronic neck pain  Rationale for Evaluation and Treatment: Rehabilitation  THERAPY DIAG:  Neck pain  Other low back pain  ONSET DATE:  01/17/24  SUBJECTIVE:                                                                                                                                                                                           SUBJECTIVE STATEMENT: Having headaches since the accident that they have been lingering. Has neck pain at the base of her skull and has headaches that goes through her forehead. Has MRI of her low back on 02/19/24. By the time the afternoon comes, she doesn't really function anymore. And gets dizzy in the afternoon. Legs feel a little weak with the back hurting. Can't bend over and sit up straight.    PERTINENT HISTORY:  L-5 disc issue per patient  PAIN:  Are you having pain? Yes: NPRS scale: Head is about a 7/10  Pain location: back of the head (headache), base of the head; bil lower back Pain description: sharp, throbbing, painful Aggravating factors: prolonged standing/walking Relieving factors: sitting, ice pack  PRECAUTIONS: C4-7 fusion (Nov 2023)  RED FLAGS: None   WEIGHT BEARING RESTRICTIONS: No  FALLS:  Has patient fallen in last 6 months? No   OCCUPATION: cashier  PLOF: Independent  PATIENT GOALS: improve headache and back pain   OBJECTIVE:  Note: Objective measures were completed at Evaluation unless otherwise noted.  DIAGNOSTIC FINDINGS:  CT of neck 01/17/24: IMPRESSION: 1. No acute fracture or listhesis of the cervical spine. 2. Anterior cervical discectomy and fusion with instrumentation of C4-C7. 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 3. Multilevel degenerative disc and degenerative joint disease resulting in moderate central canal stenosis at C4-5 and C6-7 and severe neuroforaminal narrowing on the right at C4-5 and C5-6 and bilaterally at C6-7.  MRI of lumbar spine: 04/07/2022 IMPRESSION: 1. Slight progression of previously noted grade 1 anterolisthesis of L4 on L5, with new right paracentral disc  extrusion with 6 mm cranial migration and new mild-to-moderate spinal canal stenosis. Unchanged mild left and mild-to-moderate right neural foraminal narrowing. Effacement of the lateral recesses at this level likely affects the descending L5 nerve roots. 2. Multilevel facet arthropathy, which is moderate at L3-L4 and severe at  L4-L5, which can also be a source of back pain.   PATIENT SURVEYS:  Modified Oswestry : 56% Headache disability index: 34  COGNITION: Overall cognitive status: Within functional limits for tasks assessed      PALPATION: Tender over bil suboccipital region  LUMBAR ROM:   AROM eval  Flexion 70% with neural pull in bil LE  Extension 100%  Right lateral flexion 100% with pain in back  Left lateral flexion 100% with pain in back  Right rotation   Left rotation    (Blank rows = not tested)  Cerival ROM:   AROM eval  Flexion 45  Extension 45  Right lateral flexion   Left lateral flexion   Right rotation WNL  Left rotation WNL   (Blank rows = not tested)  LOWER EXTREMITY ROM:     Active  Right eval Left eval  Hip flexion    Hip extension    Hip abduction    Hip adduction    Hip internal rotation    Hip external rotation    Knee flexion    Knee extension    Ankle dorsiflexion    Ankle plantarflexion    Ankle inversion    Ankle eversion     (Blank rows = not tested)  LOWER EXTREMITY MMT:    MMT Right eval Left eval  Hip flexion 5 5  Hip extension 5 5  Hip abduction 5 5  Hip adduction    Hip internal rotation    Hip external rotation    Knee flexion 5 5  Knee extension 5 5  Ankle dorsiflexion    Ankle plantarflexion    Ankle inversion    Ankle eversion     (Blank rows = not tested)  LUMBAR SPECIAL TESTS:  Straight leg raise test: Positive bil    TREATMENT DATE:   Therapeutic Exercise: Reviewed cervical side bending exercise (pt reports that she has been doing this at home), discussed holding for 30 seconds each side  for a stretch  Levator scap stretch each side 2 x 30 seconds bilat, pt reporting feeling really good with this stretch and provided as HEP         Showed Chirp Wheel for suboccipitals/cervical paraspinals  Showed TheraCane Pt really enjoying both the TheraCane and the ChirpWheel and plans to purchase both  Seated cat/cow 10 reps  Thread the needle at countertop 10 reps  Lower trunk rotations 10 reps, performed an additional 10 reps with arms out in a T and doing opposite head motion, pt prefers these instead of thread the needle                                                                                                    PATIENT EDUCATION:  Education details: Additions to HEP for low back and neck mobility, showed where to purchase TheraCane and ChirpWheel  Person educated: Patient Education method: Explanation Education comprehension: verbalized understanding  HOME EXERCISE PROGRAM: Self massage over suboccipital region; standing pelvis tilts (02/10/24)  Access Code: [email or web address] URL: [email or web address] Date: 02/16/2024 Prepared by: Sherlie Ban  Exercises -  Supine Lower Trunk Rotation  - 1 x daily - 7 x weekly - 2 sets - 10 reps - Cat Cow  - 1 x daily - 7 x weekly - 2 sets - 10 reps - Gentle Levator Scapulae Stretch  - 2 x daily - 7 x weekly - 3 sets - 30 hold  ASSESSMENT:  CLINICAL IMPRESSION: Today's skilled session focused on adding in gentle neck and low back stretches to HEP. Pt overall very stiff and guarded in parascapular and cervical muscles. Pt reported relief from levator scap stretch and also really liked the ChirpWheel and Theracane. Pt planned on purchasing as soon as she left the clinic. At end of session, pt reporting a decr in her headache and less pain. Will continue per POC.   OBJECTIVE IMPAIRMENTS: decreased endurance, decreased ROM, hypomobility, increased fascial restrictions, increased muscle spasms, impaired flexibility, impaired UE  functional use, postural dysfunction, and pain.   ACTIVITY LIMITATIONS: carrying, lifting, bending, standing, and squatting  PARTICIPATION LIMITATIONS: meal prep, cleaning, laundry, shopping, community activity, and occupation  PERSONAL FACTORS: Time since onset of injury/illness/exacerbation and 1-2 comorbidities: hx of LBP, cervical fusion  are also affecting patient's functional outcome.   REHAB POTENTIAL: Good  CLINICAL DECISION MAKING: Evolving/moderate complexity  EVALUATION COMPLEXITY: Moderate   GOALS: Goals reviewed with patient? Yes  SHORT TERM GOALS= Long term goals   LONG TERM GOALS: Target date: 03/09/2024    Pt will report of headache frequency to 1-2/week to improve overall function. Baseline: Daily headaches (02/10/24) Goal status: INITIAL  2.  Pt will report headaches to be <2-3/10 at worst to improve function/concentration. Baseline: 8-9/10 (02/10/24) Goal status: INITIAL  3.  Pt will be able to stand for 5 hours with LBP no worst than 3/10 to improve ability to perform her job duties. Baseline: 8/10 (02/10/24) Goal status: INITIAL  4.  Pt will report of 25 points improvement in headache disability index to improve function. Baseline: 34 (02/10/24) Goal status: INITIAL  5.  Pt will demo 20% improvement on Modified Oswestry to improve function. Baseline: 54% Goal status: INITIAL   PLAN:  PT FREQUENCY: 2x/week  PT DURATION: 4 weeks  PLANNED INTERVENTIONS: [id number]- PT Re-evaluation, 97110-Therapeutic exercises, 97530- Therapeutic activity, 97112- Neuromuscular re-education, 97535- Self Care, [id number]- Manual therapy, [phone]- Gait training, [phone]- Electrical stimulation (unattended), Patient/Family education, Balance training, Stair training, Dry Needling, Joint mobilization, Joint manipulation, Spinal manipulation, Spinal mobilization, Cryotherapy, and Moist heat.  PLAN FOR NEXT SESSION: Review and progress HEP as tolerated  Perform further vestibular  assessment?    Drake Leach, PT, DPT 02/16/2024, 11:48 AM
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
## 2024-02-18 ENCOUNTER — Encounter: Payer: Self-pay | Admitting: Nurse Practitioner

## 2024-02-18 ENCOUNTER — Ambulatory Visit: Payer: Self-pay | Admitting: Occupational Therapy

## 2024-02-18 DIAGNOSIS — R29898 Other symptoms and signs involving the musculoskeletal system: Secondary | ICD-10-CM | POA: Diagnosis not present

## 2024-02-18 DIAGNOSIS — M542 Cervicalgia: Secondary | ICD-10-CM | POA: Diagnosis not present

## 2024-02-18 DIAGNOSIS — S060X0D Concussion without loss of consciousness, subsequent encounter: Secondary | ICD-10-CM | POA: Diagnosis not present

## 2024-02-18 DIAGNOSIS — M5459 Other low back pain: Secondary | ICD-10-CM | POA: Diagnosis not present

## 2024-02-18 DIAGNOSIS — G5603 Carpal tunnel syndrome, bilateral upper limbs: Secondary | ICD-10-CM | POA: Diagnosis not present

## 2024-02-18 DIAGNOSIS — M7552 Bursitis of left shoulder: Secondary | ICD-10-CM | POA: Diagnosis not present

## 2024-02-18 DIAGNOSIS — R208 Other disturbances of skin sensation: Secondary | ICD-10-CM

## 2024-02-18 DIAGNOSIS — R278 Other lack of coordination: Secondary | ICD-10-CM | POA: Diagnosis not present

## 2024-02-18 DIAGNOSIS — R519 Headache, unspecified: Secondary | ICD-10-CM | POA: Diagnosis not present

## 2024-02-18 DIAGNOSIS — M6281 Muscle weakness (generalized): Secondary | ICD-10-CM | POA: Diagnosis not present

## 2024-02-18 DIAGNOSIS — R41841 Cognitive communication deficit: Secondary | ICD-10-CM | POA: Diagnosis not present
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
## 2024-02-18 NOTE — Patient Instructions (Signed)
 Kim Watson

## 2024-02-19 ENCOUNTER — Other Ambulatory Visit

## 2024-02-23 ENCOUNTER — Ambulatory Visit

## 2024-02-23 ENCOUNTER — Ambulatory Visit: Payer: Self-pay | Admitting: Occupational Therapy

## 2024-02-24 ENCOUNTER — Telehealth: Payer: Self-pay

## 2024-02-24 NOTE — Telephone Encounter (Signed)
 I called and spoke with patient and she said that medication was prescribed by a physician from [facility]. She said that she called on Monday but has not heard back from them. I told her per Lauren she will need to follow up with prescribing physician and she will.

## 2024-02-24 NOTE — Telephone Encounter (Signed)
 Copied from CRM [id number]. Topic: Clinical - Medical Advice >> Feb 24, 2024 11:12 AM Alcus Dad wrote: Reason for CRM: Patient stated that she has been on [facility] and has been having really bad stomach pain a few days after the first injection. Patient needs advice on what to do next. Has cramping and diarrhea

## 2024-02-24 NOTE — Telephone Encounter (Signed)
 Forwarding message below. Pt does have a upcoming appointment on 02/26/2024.

## 2024-02-25 ENCOUNTER — Encounter: Payer: Self-pay | Admitting: Occupational Therapy

## 2024-02-25 ENCOUNTER — Ambulatory Visit

## 2024-02-25 ENCOUNTER — Encounter: Payer: Medicaid Other | Admitting: Orthopedic Surgery

## 2024-02-26 ENCOUNTER — Ambulatory Visit: Admitting: Nurse Practitioner

## 2024-03-01 ENCOUNTER — Ambulatory Visit: Attending: Orthopedic Surgery

## 2024-03-01 DIAGNOSIS — M5459 Other low back pain: Secondary | ICD-10-CM | POA: Diagnosis not present

## 2024-03-01 DIAGNOSIS — R41841 Cognitive communication deficit: Secondary | ICD-10-CM | POA: Insufficient documentation

## 2024-03-01 DIAGNOSIS — M6281 Muscle weakness (generalized): Secondary | ICD-10-CM | POA: Insufficient documentation

## 2024-03-01 DIAGNOSIS — R519 Headache, unspecified: Secondary | ICD-10-CM | POA: Insufficient documentation

## 2024-03-01 DIAGNOSIS — M542 Cervicalgia: Secondary | ICD-10-CM | POA: Insufficient documentation

## 2024-03-01 NOTE — Therapy (Signed)
 OUTPATIENT PHYSICAL THERAPY THORACOLUMBAR EVALUATION   Patient Name: DASIA GUERRIER MRN: [id number] DOB:Jan 13, 1971, 53 y.o., female Today's Date: 03/01/2024  END OF SESSION:  PT End of Session - 03/01/24 1021     Visit Number 3    Number of Visits 9    Date for PT Re-Evaluation 03/09/24    Authorization Type [employer]    PT Start Time 1017    PT Stop Time 1100    PT Time Calculation (min) 43 min    Activity Tolerance Patient tolerated treatment well    Behavior During Therapy WFL for tasks assessed/performed             Past Medical History:  Diagnosis Date   Anxiety    Arthritis    Depression    Gallbladder problem    GERD (gastroesophageal reflux disease)    High blood pressure    Hypertension    Migraine    Obesity    Obstructive sleep apnea    2016 was cleared from having to use CPAP at night. Gastric bypass surgery 2015.   Vitamin D deficiency    Past Surgical History:  Procedure Laterality Date   ANTERIOR CERVICAL DECOMP/DISCECTOMY FUSION N/A 10/02/2022   Procedure: ANTERIOR CERVICAL DISCECTOMY AND FUSION CERVICAL FOUR TO SEVEN;  Surgeon: Venita Lick, MD;  Location: [facility] OR;  Service: Orthopedics;  Laterality: N/A;  4hrs 3 C-Bed   CHOLECYSTECTOMY     FOOT SURGERY Left 07/26/2022   5th metatarsal   SLEEVE GASTROPLASTY     TUBAL LIGATION     Patient Active Problem List   Diagnosis Date Noted   Concussion with no loss of consciousness 02/04/2024   Vaginal dryness 01/05/2024   Menopausal and female climacteric states 01/05/2024   Major depressive disorder, recurrent, mild (HCC) 01/05/2024   Low libido 01/05/2024   Other fatigue 01/05/2024   Chronic fatigue 01/05/2024   Arthralgia 01/05/2024   Anxiety 01/05/2024   S/P laparoscopic sleeve gastrectomy 10/20/2023   Neuropathic pain 10/12/2023   Pre-procedure lab exam 10/02/2023   Lumbar radiculopathy 09/22/2023   Hot flashes 09/10/2023   Primary insomnia 09/10/2023   Bronchitis 05/15/2023   Lumbar  pain 04/20/2023   Back pain 04/14/2023   Symptomatic mammary hypertrophy 04/14/2023   Bilateral carpal tunnel syndrome 12/12/2022   Tendonitis 12/12/2022   Cervical myelopathy (HCC) 10/02/2022   Preop examination 09/16/2022   COVID-19 08/20/2022   Acute nonintractable headache 08/20/2022   Pain in right shoulder 06/27/2022   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/07/2022   Primary hypertension 05/07/2022   Chronic neck pain 05/07/2022   Acute left-sided low back pain without sciatica 02/24/2022   Bursitis of left shoulder 01/10/2022   Dry mouth 01/10/2022   History of bariatric surgery 01/10/2022   Elevated blood pressure reading 01/10/2022   Anxiety and depression 01/10/2022   Gastroesophageal reflux disease 01/10/2022   Pain in throat 07/18/2020   Spondylolisthesis at L4-L5 level 01/27/2020   Chronic migraine without aura without status migrainosus, not intractable 08/31/2018   Recurrent oral herpes simplex 08/31/2018   Environmental and seasonal allergies 08/31/2018   Herpes simplex 05/21/2018   Glenohumeral arthritis, left 05/13/2018   Iron deficiency 03/26/2018   History of colonic polyps 12/23/2017   Prediabetes 07/13/2017   At risk for diabetes mellitus 07/02/2017   Insulin resistance 07/02/2017   Vitamin D deficiency 07/02/2017   Other hyperlipidemia 07/02/2017   Metabolic syndrome 07/02/2017   Morbid obesity (HCC) 06/18/2017   Moderate recurrent major depression (HCC) 01/02/2017   History  of sleeve gastrectomy 02/27/2016   Chronic pain of both shoulders 11/02/2014   Arthritis of both knees 10/04/2014   OSA (obstructive sleep apnea) 01/30/2014    PCP: Gerre Scull, NP  REFERRING PROVIDER: Gerre Scull, NP  REFERRING DIAG: M54.16 (ICD-10-CM) - Lumbar radiculopathy M54.2,G89.29 (ICD-10-CM) - Chronic neck pain  Rationale for Evaluation and Treatment: Rehabilitation  THERAPY DIAG:  Neck pain  Other low back pain  Acute nonintractable headache, unspecified headache  type  ONSET DATE: 01/17/24  SUBJECTIVE:                                                                                                                                                                                           SUBJECTIVE STATEMENT: Pt reports no headache. She had small bout of "start" of headache yesterday night but went away in 30 min after taking gabapentin. Neck pain is better. She has ordered theracane and it is useful. She has not ordered chirp wheel. I am feeling pain going down my R buttock and R posterior leg.  PERTINENT HISTORY:  L-5 disc issue per patient  PAIN:  Are you having pain? Yes: NPRS scale: Head is about a 7/10  Pain location: back of the head (headache), base of the head; bil lower back Pain description: sharp, throbbing, painful Aggravating factors: prolonged standing/walking Relieving factors: sitting, ice pack  PRECAUTIONS: C4-7 fusion (Nov 2023)  RED FLAGS: None   WEIGHT BEARING RESTRICTIONS: No  FALLS:  Has patient fallen in last 6 months? No   OCCUPATION: cashier  PLOF: Independent  PATIENT GOALS: improve headache and back pain   OBJECTIVE:  Note: Objective measures were completed at Evaluation unless otherwise noted.  DIAGNOSTIC FINDINGS:  CT of neck 01/17/24: IMPRESSION: 1. No acute fracture or listhesis of the cervical spine. 2. Anterior cervical discectomy and fusion with instrumentation of C4-C7. 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 3. Multilevel degenerative disc and degenerative joint disease resulting in moderate central canal stenosis at C4-5 and C6-7 and severe neuroforaminal narrowing on the right at C4-5 and C5-6 and bilaterally at C6-7.  MRI of lumbar spine: 04/07/2022 IMPRESSION: 1. Slight progression of previously noted grade 1 anterolisthesis of L4 on L5, with new right paracentral disc extrusion with 6 mm cranial migration and new mild-to-moderate spinal canal  stenosis. Unchanged mild left and mild-to-moderate right neural foraminal narrowing. Effacement of the lateral recesses at this level likely affects the descending L5 nerve roots. 2. Multilevel facet arthropathy, which is moderate at L3-L4 and severe at L4-L5, which can also be a source of back pain.   PATIENT  SURVEYS:  Modified Oswestry : 56% Headache disability index: 34  COGNITION: Overall cognitive status: Within functional limits for tasks assessed      PALPATION: Tender over bil suboccipital region  LUMBAR ROM:   AROM eval  Flexion 70% with neural pull in bil LE  Extension 100%  Right lateral flexion 100% with pain in back  Left lateral flexion 100% with pain in back  Right rotation   Left rotation    (Blank rows = not tested)  Cerival ROM:   AROM eval  Flexion 45  Extension 45  Right lateral flexion   Left lateral flexion   Right rotation WNL  Left rotation WNL   (Blank rows = not tested)  LOWER EXTREMITY ROM:     Active  Right eval Left eval  Hip flexion    Hip extension    Hip abduction    Hip adduction    Hip internal rotation    Hip external rotation    Knee flexion    Knee extension    Ankle dorsiflexion    Ankle plantarflexion    Ankle inversion    Ankle eversion     (Blank rows = not tested)  LOWER EXTREMITY MMT:    MMT Right eval Left eval  Hip flexion 5 5  Hip extension 5 5  Hip abduction 5 5  Hip adduction    Hip internal rotation    Hip external rotation    Knee flexion 5 5  Knee extension 5 5  Ankle dorsiflexion    Ankle plantarflexion    Ankle inversion    Ankle eversion     (Blank rows = not tested)  LUMBAR SPECIAL TESTS:  Straight leg raise test: Positive bil    TREATMENT DATE:   Therapeutic Exercise: Reviewed how to use theracane and gave different suggestions using active release therapy (holding the trigger point with theracane as she performs cervical flexion and extension). Also educated on how to use  theracane on her lower back and hips. Educated pt on how to find trigger point for piriformis with theracane and apply active release therapy with clamshells in L sidelying position for R piriformis.  Lower trunk rotations: 15x R and L Grade V neutral gaping mobs: R x 2, L x 1 Supine from flexed position: concentric hip extensions to neutral with manual resistance: 10x From neutral position to flexed position in supine: eccentric hip extensions: 10x against PT's resistance Unilateral Y clamshells: 3 x 10 R and L    PATIENT EDUCATION:  Education details: Additions to HEP for low back and neck mobility, showed where to purchase TheraCane and ChirpWheel  Person educated: Patient Education method: Explanation Education comprehension: verbalized understanding  HOME EXERCISE PROGRAM: Self massage over suboccipital region; standing pelvis tilts (02/10/24)  Access Code: [email or web address] URL: [email or web address] Date: 02/16/2024 Prepared by: Sherlie Ban  Exercises - Supine Lower Trunk Rotation  - 1 x daily - 7 x weekly - 2 sets - 10 reps - Cat Cow  - 1 x daily - 7 x weekly - 2 sets - 10 reps - Gentle Levator Scapulae Stretch  - 2 x daily - 7 x weekly - 3 sets - 30 hold  ASSESSMENT:  CLINICAL IMPRESSION: Pt is demo improved pain management in neck and upper back with reporting of decreasing pain in neck, shoulders, headaches. Back pain contin  OBJECTIVE IMPAIRMENTS: decreased endurance, decreased ROM, hypomobility, increased fascial restrictions, increased muscle spasms, impaired flexibility, impaired UE functional use, postural dysfunction, and pain.  ACTIVITY LIMITATIONS: carrying, lifting, bending, standing, and squatting  PARTICIPATION LIMITATIONS: meal prep, cleaning, laundry, shopping, community activity, and occupation  PERSONAL FACTORS: Time since onset of injury/illness/exacerbation and 1-2 comorbidities: hx of LBP, cervical fusion  are also affecting patient's  functional outcome.   REHAB POTENTIAL: Good  CLINICAL DECISION MAKING: Evolving/moderate complexity  EVALUATION COMPLEXITY: Moderate   GOALS: Goals reviewed with patient? Yes  SHORT TERM GOALS= Long term goals   LONG TERM GOALS: Target date: 03/09/2024    Pt will report of headache frequency to 1-2/week to improve overall function. Baseline: Daily headaches (02/10/24) Goal status: INITIAL  2.  Pt will report headaches to be <2-3/10 at worst to improve function/concentration. Baseline: 8-9/10 (02/10/24) Goal status: INITIAL  3.  Pt will be able to stand for 5 hours with LBP no worst than 3/10 to improve ability to perform her job duties. Baseline: 8/10 (02/10/24) Goal status: INITIAL  4.  Pt will report of 25 points improvement in headache disability index to improve function. Baseline: 34 (02/10/24) Goal status: INITIAL  5.  Pt will demo 20% improvement on Modified Oswestry to improve function. Baseline: 54% Goal status: INITIAL   PLAN:  PT FREQUENCY: 2x/week  PT DURATION: 4 weeks  PLANNED INTERVENTIONS: 97164- PT Re-evaluation, 97110-Therapeutic exercises, 97530- Therapeutic activity, 97112- Neuromuscular re-education, 97535- Self Care, 16109- Manual therapy, [phone]- Gait training, [phone]- Electrical stimulation (unattended), Patient/Family education, Balance training, Stair training, Dry Needling, Joint mobilization, Joint manipulation, Spinal manipulation, Spinal mobilization, Cryotherapy, and Moist heat.  PLAN FOR NEXT SESSION: Review and progress HEP as tolerated  Perform further vestibular assessment?    Ileana Ladd, PT, DPT 03/01/2024, 11:02 AM

## 2024-03-03 ENCOUNTER — Ambulatory Visit

## 2024-03-04 ENCOUNTER — Ambulatory Visit

## 2024-03-04 DIAGNOSIS — M6281 Muscle weakness (generalized): Secondary | ICD-10-CM

## 2024-03-04 DIAGNOSIS — R41841 Cognitive communication deficit: Secondary | ICD-10-CM | POA: Diagnosis not present

## 2024-03-04 DIAGNOSIS — M542 Cervicalgia: Secondary | ICD-10-CM

## 2024-03-04 DIAGNOSIS — M5459 Other low back pain: Secondary | ICD-10-CM

## 2024-03-04 DIAGNOSIS — R519 Headache, unspecified: Secondary | ICD-10-CM | POA: Diagnosis not present

## 2024-03-04 NOTE — Therapy (Signed)
 OUTPATIENT PHYSICAL THERAPY THORACOLUMBAR EVALUATION   Patient Name: Kim Watson MRN: [id number] DOB:[birth year], 53 y.o., female Today's Date: 03/04/2024  END OF SESSION:  PT End of Session - 03/04/24 1103     Visit Number 4    Number of Visits 9    Date for PT Re-Evaluation 03/09/24    Authorization Type [employer]    PT Start Time 1100    PT Stop Time 1145    PT Time Calculation (min) 45 min    Activity Tolerance Patient tolerated treatment well    Behavior During Therapy WFL for tasks assessed/performed             Past Medical History:  Diagnosis Date   Anxiety    Arthritis    Depression    Gallbladder problem    GERD (gastroesophageal reflux disease)    High blood pressure    Hypertension    Migraine    Obesity    Obstructive sleep apnea    2016 was cleared from having to use CPAP at night. Gastric bypass surgery 2015.   Vitamin D deficiency    Past Surgical History:  Procedure Laterality Date   ANTERIOR CERVICAL DECOMP/DISCECTOMY FUSION N/A 10/02/2022   Procedure: ANTERIOR CERVICAL DISCECTOMY AND FUSION CERVICAL FOUR TO SEVEN;  Surgeon: Venita Lick, MD;  Location: [facility] OR;  Service: Orthopedics;  Laterality: N/A;  4hrs 3 C-Bed   CHOLECYSTECTOMY     FOOT SURGERY Left 07/26/2022   5th metatarsal   SLEEVE GASTROPLASTY     TUBAL LIGATION     Patient Active Problem List   Diagnosis Date Noted   Concussion with no loss of consciousness 02/04/2024   Vaginal dryness 01/05/2024   Menopausal and female climacteric states 01/05/2024   Major depressive disorder, recurrent, mild (HCC) 01/05/2024   Low libido 01/05/2024   Other fatigue 01/05/2024   Chronic fatigue 01/05/2024   Arthralgia 01/05/2024   Anxiety 01/05/2024   S/P laparoscopic sleeve gastrectomy 10/20/2023   Neuropathic pain 10/12/2023   Pre-procedure lab exam 10/02/2023   Lumbar radiculopathy 09/22/2023   Hot flashes 09/10/2023   Primary insomnia 09/10/2023   Bronchitis 05/15/2023   Lumbar  pain 04/20/2023   Back pain 04/14/2023   Symptomatic mammary hypertrophy 04/14/2023   Bilateral carpal tunnel syndrome 12/12/2022   Tendonitis 12/12/2022   Cervical myelopathy (HCC) 10/02/2022   Preop examination 09/16/2022   COVID-19 08/20/2022   Acute nonintractable headache 08/20/2022   Pain in right shoulder 06/27/2022   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/07/2022   Primary hypertension 05/07/2022   Chronic neck pain 05/07/2022   Acute left-sided low back pain without sciatica 02/24/2022   Bursitis of left shoulder 01/10/2022   Dry mouth 01/10/2022   History of bariatric surgery 01/10/2022   Elevated blood pressure reading 01/10/2022   Anxiety and depression 01/10/2022   Gastroesophageal reflux disease 01/10/2022   Pain in throat 07/18/2020   Spondylolisthesis at L4-L5 level 01/27/2020   Chronic migraine without aura without status migrainosus, not intractable 08/31/2018   Recurrent oral herpes simplex 08/31/2018   Environmental and seasonal allergies 08/31/2018   Herpes simplex 05/21/2018   Glenohumeral arthritis, left 05/13/2018   Iron deficiency 03/26/2018   History of colonic polyps 12/23/2017   Prediabetes 07/13/2017   At risk for diabetes mellitus 07/02/2017   Insulin resistance 07/02/2017   Vitamin D deficiency 07/02/2017   Other hyperlipidemia 07/02/2017   Metabolic syndrome 07/02/2017   Morbid obesity (HCC) 06/18/2017   Moderate recurrent major depression (HCC) 01/02/2017   History  of sleeve gastrectomy 02/27/2016   Chronic pain of both shoulders 11/02/2014   Arthritis of both knees 10/04/2014   OSA (obstructive sleep apnea) 01/30/2014    PCP: Gerre Scull, NP  REFERRING PROVIDER: Gerre Scull, NP  REFERRING DIAG: M54.16 (ICD-10-CM) - Lumbar radiculopathy M54.2,G89.29 (ICD-10-CM) - Chronic neck pain  Rationale for Evaluation and Treatment: Rehabilitation  THERAPY DIAG:  Neck pain  Other low back pain  Muscle weakness (generalized)  ONSET DATE:  01/17/24  SUBJECTIVE:                                                                                                                                                                                           SUBJECTIVE STATEMENT: Pt reports back felt great after last session with almost no pain but she is feeling R sciatica symptoms.   PERTINENT HISTORY:  L-5 disc issue per patient  PAIN:  Are you having pain? Yes: NPRS scale: Head is about a 6/10  Pain location: back of the head (headache), base of the head; bil lower back Pain description: sharp, throbbing, painful Aggravating factors: prolonged standing/walking Relieving factors: sitting, ice pack  PRECAUTIONS: C4-7 fusion (Nov 2023)  RED FLAGS: None   WEIGHT BEARING RESTRICTIONS: No  FALLS:  Has patient fallen in last 6 months? No   OCCUPATION: cashier  PLOF: Independent  PATIENT GOALS: improve headache and back pain   OBJECTIVE:  Note: Objective measures were completed at Evaluation unless otherwise noted.  DIAGNOSTIC FINDINGS:  CT of neck 01/17/24: IMPRESSION: 1. No acute fracture or listhesis of the cervical spine. 2. Anterior cervical discectomy and fusion with instrumentation of C4-C7. 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 3. Multilevel degenerative disc and degenerative joint disease resulting in moderate central canal stenosis at C4-5 and C6-7 and severe neuroforaminal narrowing on the right at C4-5 and C5-6 and bilaterally at C6-7.  MRI of lumbar spine: 04/07/2022 IMPRESSION: 1. Slight progression of previously noted grade 1 anterolisthesis of L4 on L5, with new right paracentral disc extrusion with 6 mm cranial migration and new mild-to-moderate spinal canal stenosis. Unchanged mild left and mild-to-moderate right neural foraminal narrowing. Effacement of the lateral recesses at this level likely affects the descending L5 nerve roots. 2. Multilevel facet  arthropathy, which is moderate at L3-L4 and severe at L4-L5, which can also be a source of back pain.   PATIENT SURVEYS:  Modified Oswestry : 56% Headache disability index: 34  COGNITION: Overall cognitive status: Within functional limits for tasks assessed      PALPATION: Tender over bil suboccipital region  LUMBAR ROM:  AROM eval  Flexion 70% with neural pull in bil LE  Extension 100%  Right lateral flexion 100% with pain in back  Left lateral flexion 100% with pain in back  Right rotation   Left rotation    (Blank rows = not tested)  Cerival ROM:   AROM eval  Flexion 45  Extension 45  Right lateral flexion   Left lateral flexion   Right rotation WNL  Left rotation WNL   (Blank rows = not tested)  LOWER EXTREMITY ROM:     Active  Right eval Left eval  Hip flexion    Hip extension    Hip abduction    Hip adduction    Hip internal rotation    Hip external rotation    Knee flexion    Knee extension    Ankle dorsiflexion    Ankle plantarflexion    Ankle inversion    Ankle eversion     (Blank rows = not tested)  LOWER EXTREMITY MMT:    MMT Right eval Left eval  Hip flexion 5 5  Hip extension 5 5  Hip abduction 5 5  Hip adduction    Hip internal rotation    Hip external rotation    Knee flexion 5 5  Knee extension 5 5  Ankle dorsiflexion    Ankle plantarflexion    Ankle inversion    Ankle eversion     (Blank rows = not tested)  LUMBAR SPECIAL TESTS:  Straight leg raise test: Positive bil    TREATMENT DATE:   Therapeutic Exercise: Active release with trigger point hold over R piriformis as patient did clamshells on R only: 5 x 10 Passive stretching of R piriformis in supine figure 4: pushing knee away: 3 x 30" and pushing knee to opp shoulder: 3 x 30" Supine bridge: 2 x 10 Supine double knee to chest with slow eccentric lowering: 3 x 10 Prone external rotation of R hip manually resisted: 3 x 10 Deep tissue work to Proximal hamstrings  on R  PATIENT EDUCATION:  Education details: Additions to HEP for low back and neck mobility, showed where to purchase TheraCane and ChirpWheel  Person educated: Patient Education method: Explanation Education comprehension: verbalized understanding  HOME EXERCISE PROGRAM: Self massage over suboccipital region; standing pelvis tilts (02/10/24)  Access Code: [email or web address] URL: [email or web address] Date: 03/04/2024 Prepared by: Lavone Nian  Exercises - Supine Lower Trunk Rotation  - 1 x daily - 7 x weekly - 2 sets - 10 reps - Cat Cow  - 1 x daily - 7 x weekly - 2 sets - 10 reps - Gentle Levator Scapulae Stretch  - 2 x daily - 7 x weekly - 3 sets - 30 hold - Supine Double Bent Leg Lift  - 1 x daily - 7 x weekly - 3 sets - 10 reps - Supine Bridge  - 1 x daily - 7 x weekly - 2 sets - 10 reps  ASSESSMENT:  CLINICAL IMPRESSION: Pt is reporting improved back pain after last session. Sciatica symptoms improved at end of the session.  OBJECTIVE IMPAIRMENTS: decreased endurance, decreased ROM, hypomobility, increased fascial restrictions, increased muscle spasms, impaired flexibility, impaired UE functional use, postural dysfunction, and pain.   ACTIVITY LIMITATIONS: carrying, lifting, bending, standing, and squatting  PARTICIPATION LIMITATIONS: meal prep, cleaning, laundry, shopping, community activity, and occupation  PERSONAL FACTORS: Time since onset of injury/illness/exacerbation and 1-2 comorbidities: hx of LBP, cervical fusion  are also affecting patient's functional outcome.   REHAB  POTENTIAL: Good  CLINICAL DECISION MAKING: Evolving/moderate complexity  EVALUATION COMPLEXITY: Moderate   GOALS: Goals reviewed with patient? Yes  SHORT TERM GOALS= Long term goals   LONG TERM GOALS: Target date: 03/09/2024    Pt will report of headache frequency to 1-2/week to improve overall function. Baseline: Daily headaches (02/10/24) Goal status: INITIAL  2.  Pt will  report headaches to be <2-3/10 at worst to improve function/concentration. Baseline: 8-9/10 (02/10/24) Goal status: INITIAL  3.  Pt will be able to stand for 5 hours with LBP no worst than 3/10 to improve ability to perform her job duties. Baseline: 8/10 (02/10/24) Goal status: INITIAL  4.  Pt will report of 25 points improvement in headache disability index to improve function. Baseline: 34 (02/10/24) Goal status: INITIAL  5.  Pt will demo 20% improvement on Modified Oswestry to improve function. Baseline: 54% Goal status: INITIAL   PLAN:  PT FREQUENCY: 2x/week  PT DURATION: 4 weeks  PLANNED INTERVENTIONS: 97164- PT Re-evaluation, 97110-Therapeutic exercises, 97530- Therapeutic activity, 97112- Neuromuscular re-education, 97535- Self Care, 29528- Manual therapy, [phone]- Gait training, [phone]- Electrical stimulation (unattended), Patient/Family education, Balance training, Stair training, Dry Needling, Joint mobilization, Joint manipulation, Spinal manipulation, Spinal mobilization, Cryotherapy, and Moist heat.  PLAN FOR NEXT SESSION: Review and progress HEP as tolerated  Perform further vestibular assessment?    Ileana Ladd, PT, DPT 03/04/2024, 11:03 AM
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
## 2024-03-08 ENCOUNTER — Ambulatory Visit

## 2024-03-08 DIAGNOSIS — M5459 Other low back pain: Secondary | ICD-10-CM

## 2024-03-08 DIAGNOSIS — M542 Cervicalgia: Secondary | ICD-10-CM

## 2024-03-08 DIAGNOSIS — M6281 Muscle weakness (generalized): Secondary | ICD-10-CM

## 2024-03-08 DIAGNOSIS — R41841 Cognitive communication deficit: Secondary | ICD-10-CM | POA: Diagnosis not present

## 2024-03-08 DIAGNOSIS — R519 Headache, unspecified: Secondary | ICD-10-CM | POA: Diagnosis not present

## 2024-03-08 NOTE — Therapy (Signed)
 OUTPATIENT PHYSICAL THERAPY THORACOLUMBAR EVALUATION   Patient Name: Kim Watson MRN: [id number] DOB:1971/05/16, 53 y.o., female Today's Date: 03/08/2024  END OF SESSION:  PT End of Session - 03/08/24 1123     Visit Number 5    Number of Visits 9    Date for PT Re-Evaluation 03/09/24    Authorization Type [employer]    PT Start Time 1110    PT Stop Time 1145    PT Time Calculation (min) 35 min    Activity Tolerance Patient tolerated treatment well    Behavior During Therapy WFL for tasks assessed/performed             Past Medical History:  Diagnosis Date   Anxiety    Arthritis    Depression    Gallbladder problem    GERD (gastroesophageal reflux disease)    High blood pressure    Hypertension    Migraine    Obesity    Obstructive sleep apnea    2016 was cleared from having to use CPAP at night. Gastric bypass surgery 2015.   Vitamin D deficiency    Past Surgical History:  Procedure Laterality Date   ANTERIOR CERVICAL DECOMP/DISCECTOMY FUSION N/A 10/02/2022   Procedure: ANTERIOR CERVICAL DISCECTOMY AND FUSION CERVICAL FOUR TO SEVEN;  Surgeon: Venita Lick, MD;  Location: [facility] OR;  Service: Orthopedics;  Laterality: N/A;  4hrs 3 C-Bed   CHOLECYSTECTOMY     FOOT SURGERY Left 07/26/2022   5th metatarsal   SLEEVE GASTROPLASTY     TUBAL LIGATION     Patient Active Problem List   Diagnosis Date Noted   Concussion with no loss of consciousness 02/04/2024   Vaginal dryness 01/05/2024   Menopausal and female climacteric states 01/05/2024   Major depressive disorder, recurrent, mild (HCC) 01/05/2024   Low libido 01/05/2024   Other fatigue 01/05/2024   Chronic fatigue 01/05/2024   Arthralgia 01/05/2024   Anxiety 01/05/2024   S/P laparoscopic sleeve gastrectomy 10/20/2023   Neuropathic pain 10/12/2023   Pre-procedure lab exam 10/02/2023   Lumbar radiculopathy 09/22/2023   Hot flashes 09/10/2023   Primary insomnia 09/10/2023   Bronchitis 05/15/2023   Lumbar  pain 04/20/2023   Back pain 04/14/2023   Symptomatic mammary hypertrophy 04/14/2023   Bilateral carpal tunnel syndrome 12/12/2022   Tendonitis 12/12/2022   Cervical myelopathy (HCC) 10/02/2022   Preop examination 09/16/2022   COVID-19 08/20/2022   Acute nonintractable headache 08/20/2022   Pain in right shoulder 06/27/2022   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/07/2022   Primary hypertension 05/07/2022   Chronic neck pain 05/07/2022   Acute left-sided low back pain without sciatica 02/24/2022   Bursitis of left shoulder 01/10/2022   Dry mouth 01/10/2022   History of bariatric surgery 01/10/2022   Elevated blood pressure reading 01/10/2022   Anxiety and depression 01/10/2022   Gastroesophageal reflux disease 01/10/2022   Pain in throat 07/18/2020   Spondylolisthesis at L4-L5 level 01/27/2020   Chronic migraine without aura without status migrainosus, not intractable 08/31/2018   Recurrent oral herpes simplex 08/31/2018   Environmental and seasonal allergies 08/31/2018   Herpes simplex 05/21/2018   Glenohumeral arthritis, left 05/13/2018   Iron deficiency 03/26/2018   History of colonic polyps 12/23/2017   Prediabetes 07/13/2017   At risk for diabetes mellitus 07/02/2017   Insulin resistance 07/02/2017   Vitamin D deficiency 07/02/2017   Other hyperlipidemia 07/02/2017   Metabolic syndrome 07/02/2017   Morbid obesity (HCC) 06/18/2017   Moderate recurrent major depression (HCC) 01/02/2017   History  of sleeve gastrectomy 02/27/2016   Chronic pain of both shoulders 11/02/2014   Arthritis of both knees 10/04/2014   OSA (obstructive sleep apnea) 01/30/2014    PCP: Gerre Scull, NP  REFERRING PROVIDER: Gerre Scull, NP  REFERRING DIAG: M54.16 (ICD-10-CM) - Lumbar radiculopathy M54.2,G89.29 (ICD-10-CM) - Chronic neck pain  Rationale for Evaluation and Treatment: Rehabilitation  THERAPY DIAG:  Neck pain  Other low back pain  Muscle weakness (generalized)  Acute  nonintractable headache, unspecified headache type  ONSET DATE: 01/17/24  SUBJECTIVE:                                                                                                                                                                                           SUBJECTIVE STATEMENT: After last session, she felt good for rest of the day but next day she felt heavy in L leg and felt R leg pain going all the way down to the knee.   PERTINENT HISTORY:  L-5 disc issue per patient  PAIN:  Are you having pain? Yes: NPRS scale: Head is about a 6/10  Pain location: back of the head (headache), base of the head; bil lower back Pain description: sharp, throbbing, painful Aggravating factors: prolonged standing/walking Relieving factors: sitting, ice pack  PRECAUTIONS: C4-7 fusion (Nov 2023)  RED FLAGS: None   WEIGHT BEARING RESTRICTIONS: No  FALLS:  Has patient fallen in last 6 months? No   OCCUPATION: cashier  PLOF: Independent  PATIENT GOALS: improve headache and back pain   OBJECTIVE:  Note: Objective measures were completed at Evaluation unless otherwise noted.  DIAGNOSTIC FINDINGS:  CT of neck 01/17/24: IMPRESSION: 1. No acute fracture or listhesis of the cervical spine. 2. Anterior cervical discectomy and fusion with instrumentation of C4-C7. 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 3. Multilevel degenerative disc and degenerative joint disease resulting in moderate central canal stenosis at C4-5 and C6-7 and severe neuroforaminal narrowing on the right at C4-5 and C5-6 and bilaterally at C6-7.  MRI of lumbar spine: 04/07/2022 IMPRESSION: 1. Slight progression of previously noted grade 1 anterolisthesis of L4 on L5, with new right paracentral disc extrusion with 6 mm cranial migration and new mild-to-moderate spinal canal stenosis. Unchanged mild left and mild-to-moderate right neural foraminal narrowing. Effacement  of the lateral recesses at this level likely affects the descending L5 nerve roots. 2. Multilevel facet arthropathy, which is moderate at L3-L4 and severe at L4-L5, which can also be a source of back pain.   PATIENT SURVEYS:  Modified Oswestry : 56% Headache disability index: 34  COGNITION: Overall cognitive status: Within  functional limits for tasks assessed      PALPATION: Tender over bil suboccipital region  LUMBAR ROM:   AROM eval  Flexion 70% with neural pull in bil LE  Extension 100%  Right lateral flexion 100% with pain in back  Left lateral flexion 100% with pain in back  Right rotation   Left rotation    (Blank rows = not tested)  Cerival ROM:   AROM eval  Flexion 45  Extension 45  Right lateral flexion   Left lateral flexion   Right rotation WNL  Left rotation WNL   (Blank rows = not tested)  LOWER EXTREMITY ROM:     Active  Right eval Left eval  Hip flexion    Hip extension    Hip abduction    Hip adduction    Hip internal rotation    Hip external rotation    Knee flexion    Knee extension    Ankle dorsiflexion    Ankle plantarflexion    Ankle inversion    Ankle eversion     (Blank rows = not tested)  LOWER EXTREMITY MMT:    MMT Right eval Left eval  Hip flexion 5 5  Hip extension 5 5  Hip abduction 5 5  Hip adduction    Hip internal rotation    Hip external rotation    Knee flexion 5 5  Knee extension 5 5  Ankle dorsiflexion    Ankle plantarflexion    Ankle inversion    Ankle eversion     (Blank rows = not tested)  LUMBAR SPECIAL TESTS:  Straight leg raise test: Positive bil    TREATMENT DATE:   Lumbar flexion: 70% with neural tension in posterior R leg Q-ped unilateral piriformis stretch: 10 x R and L Grade IV extension closing mobs in thoracic spine- pt reported immediate improvement that L leg didn't feel heavy afterwards. No change in symptoms of R LE Self thoracic extension mobilization over foam roll:  5' Movement with mobilization: central PA at L3 with press ups: 10x Seated slump position neural flossing in R LE: 20x, pt reported improved intensity of radicular symptoms in L LE. Pt verbally given this to perform 2-3x/day for HEP  PATIENT EDUCATION:  Education details: Additions to HEP for low back and neck mobility, showed where to purchase TheraCane and ChirpWheel  Person educated: Patient Education method: Explanation Education comprehension: verbalized understanding  HOME EXERCISE PROGRAM: Self massage over suboccipital region; standing pelvis tilts (02/10/24)  Access Code: [email or web address] URL: [email or web address] Date: 03/04/2024 Prepared by: Lavone Nian  Exercises - Supine Lower Trunk Rotation  - 1 x daily - 7 x weekly - 2 sets - 10 reps - Cat Cow  - 1 x daily - 7 x weekly - 2 sets - 10 reps - Gentle Levator Scapulae Stretch  - 2 x daily - 7 x weekly - 3 sets - 30 hold - Supine Double Bent Leg Lift  - 1 x daily - 7 x weekly - 3 sets - 10 reps - Supine Bridge  - 1 x daily - 7 x weekly - 2 sets - 10 reps  ASSESSMENT:  CLINICAL IMPRESSION: Pt's L LE symptoms resolved after manual therapy. Pt reported improving R radicular symptoms with neural flossing.  OBJECTIVE IMPAIRMENTS: decreased endurance, decreased ROM, hypomobility, increased fascial restrictions, increased muscle spasms, impaired flexibility, impaired UE functional use, postural dysfunction, and pain.   ACTIVITY LIMITATIONS: carrying, lifting, bending, standing, and squatting  PARTICIPATION LIMITATIONS: meal prep, cleaning,  laundry, shopping, community activity, and occupation  PERSONAL FACTORS: Time since onset of injury/illness/exacerbation and 1-2 comorbidities: hx of LBP, cervical fusion  are also affecting patient's functional outcome.   REHAB POTENTIAL: Good  CLINICAL DECISION MAKING: Evolving/moderate complexity  EVALUATION COMPLEXITY: Moderate   GOALS: Goals reviewed with patient?  Yes  SHORT TERM GOALS= Long term goals   LONG TERM GOALS: Target date: 03/09/2024    Pt will report of headache frequency to 1-2/week to improve overall function. Baseline: Daily headaches (02/10/24) Goal status: INITIAL  2.  Pt will report headaches to be <2-3/10 at worst to improve function/concentration. Baseline: 8-9/10 (02/10/24) Goal status: INITIAL  3.  Pt will be able to stand for 5 hours with LBP no worst than 3/10 to improve ability to perform her job duties. Baseline: 8/10 (02/10/24) Goal status: INITIAL  4.  Pt will report of 25 points improvement in headache disability index to improve function. Baseline: 34 (02/10/24) Goal status: INITIAL  5.  Pt will demo 20% improvement on Modified Oswestry to improve function. Baseline: 54% Goal status: INITIAL   PLAN:  PT FREQUENCY: 2x/week  PT DURATION: 4 weeks  PLANNED INTERVENTIONS: 97164- PT Re-evaluation, 97110-Therapeutic exercises, 97530- Therapeutic activity, 97112- Neuromuscular re-education, 97535- Self Care, 60454- Manual therapy, [phone]- Gait training, [phone]- Electrical stimulation (unattended), Patient/Family education, Balance training, Stair training, Dry Needling, Joint mobilization, Joint manipulation, Spinal manipulation, Spinal mobilization, Cryotherapy, and Moist heat.  PLAN FOR NEXT SESSION: Did neural flossing help for R LE symptoms?   Ileana Ladd, PT, DPT 03/08/2024, 11:23 AM

## 2024-03-08 NOTE — Patient Instructions (Addendum)
 Memory Compensation Strategies  Use "WARM" strategy. W= write it down A=  associate it R=  repeat it M=  make a mental picture  Use sticky notes. Place sticky notes with reminders in a place where the task is performed.  For example:  "turn off the stove" placed by the stove, "lock the door" placed on the door at eye level, "take your medications" on the bathroom mirror or by the place where you normally take your medications  Use alarms, timers, and/or a reminder app. Use while cooking to remind yourself to check on food or as a reminder to take your medicine, or as a reminder to make a call, or as a reminder to perform another task, etc.  Strategies for Improving Your Attention and Memory  Use good eye-contact Give the speaker your undivided attention Look directly at the speaker  Complete one task at a time Avoid multitasking Complete one task before starting a new one Write a note to yourself if you think of something else that needs to be done Let others know when you need quiet time and can't be interrupted Don't answer the phone, texts, or emails while you are working on another task  Put aside distracting thoughts If you find your mind wandering, refocus your attention on the speaker Avoid off-topic comments or responses that may divert your attention  If something important comes to mind, let the speaker know and pause to write yourself a note: "Do you mind holding on a minute, I have to write something down." Put thoughts on hold and focus on salient information  Limit distractions in your environment Think about the environment around you Limit background noise by turning off the TV or music, putting your phone away Close the door and work in quiet  Use active listening Actively participate in the conversation to stay focused Paraphrase what you have heard to include the most important details Adding some associations may help you remember Ask questions to clarify  certain points Summarize the speaker's comments periodically Avoid nodding your head and using "mhm" responses as these are more passive and don't help your attention  Alert the other person/people It may be helpful to alert your listener to the fact that you may need reminders to keep on track Tell the speaker in advance that you may need to stop them and have them repeat salient information If you lose focus, interject and let the person know, "I'm sorry, I lost you, can you tell me again?"  Write down information Write down pertinent information as it comes up, such as telephone numbers, names of people, addresses, details from appointments and conversations, etc.

## 2024-03-10 ENCOUNTER — Ambulatory Visit

## 2024-03-11 ENCOUNTER — Ambulatory Visit

## 2024-03-14 ENCOUNTER — Ambulatory Visit

## 2024-03-14 DIAGNOSIS — M5459 Other low back pain: Secondary | ICD-10-CM | POA: Diagnosis not present

## 2024-03-14 DIAGNOSIS — R41841 Cognitive communication deficit: Secondary | ICD-10-CM | POA: Diagnosis not present

## 2024-03-14 DIAGNOSIS — M6281 Muscle weakness (generalized): Secondary | ICD-10-CM | POA: Diagnosis not present

## 2024-03-14 DIAGNOSIS — M542 Cervicalgia: Secondary | ICD-10-CM | POA: Diagnosis not present

## 2024-03-14 DIAGNOSIS — R519 Headache, unspecified: Secondary | ICD-10-CM | POA: Diagnosis not present
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
## 2024-03-15 ENCOUNTER — Encounter: Payer: Self-pay | Admitting: Family Medicine

## 2024-03-15 ENCOUNTER — Ambulatory Visit

## 2024-03-15 ENCOUNTER — Encounter: Payer: Self-pay | Admitting: Nurse Practitioner

## 2024-03-15 ENCOUNTER — Ambulatory Visit: Admitting: Nurse Practitioner

## 2024-03-15 VITALS — BP 129/82 | HR 78 | Temp 97.0°F | Resp 18 | Wt 279.0 lb

## 2024-03-15 DIAGNOSIS — Z1231 Encounter for screening mammogram for malignant neoplasm of breast: Secondary | ICD-10-CM | POA: Diagnosis not present

## 2024-03-15 DIAGNOSIS — R232 Flushing: Secondary | ICD-10-CM

## 2024-03-15 DIAGNOSIS — F32A Depression, unspecified: Secondary | ICD-10-CM | POA: Diagnosis not present

## 2024-03-15 DIAGNOSIS — G8929 Other chronic pain: Secondary | ICD-10-CM

## 2024-03-15 DIAGNOSIS — M546 Pain in thoracic spine: Secondary | ICD-10-CM | POA: Diagnosis not present

## 2024-03-15 DIAGNOSIS — F419 Anxiety disorder, unspecified: Secondary | ICD-10-CM | POA: Diagnosis not present

## 2024-03-15 MED ORDER — CLONIDINE 0.1 MG/24HR TD PTWK: 0.1000 mg | MEDICATED_PATCH | TRANSDERMAL | 2 refills | Status: DC

## 2024-03-15 MED ORDER — DULOXETINE HCL 60 MG PO CPEP: 60.0000 mg | ORAL_CAPSULE | Freq: Every day | ORAL | 1 refills | Status: DC
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
## 2024-03-15 NOTE — Assessment & Plan Note (Signed)
 Hot flashes, previously managed with venlafaxine, require an alternative due to anxiety and pain management needs. Start clonidine 0.1mg  patch once a week. Follow-up in 2 months.
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
## 2024-03-15 NOTE — Assessment & Plan Note (Signed)
 Chronic, not controlled. Her anxiety and panic attacks, exacerbated by chronic pain, are not controlled with venlafaxine. Discontinue venlafaxine and restart duloxetine 60 mg daily.

## 2024-03-15 NOTE — Patient Instructions (Signed)
 It was great to see you!  Stop the effexor and restart cymbalta (duloxetine)  Start clonidine patch once a week for hot flashes   I have ordered the MRI of your back   Let's follow-up at your next visit  Take care,  Rheba Cedar, NP

## 2024-03-15 NOTE — Assessment & Plan Note (Signed)
 She has lost 11 pounds on Wegovy, currently at a 0.25 mg dose. She will go up to 0.5mg  in 2 weeks. Continue focus on nutrition and exercise.

## 2024-03-16 ENCOUNTER — Ambulatory Visit

## 2024-03-17 ENCOUNTER — Encounter: Admitting: Orthopedic Surgery

## 2024-03-17 ENCOUNTER — Ambulatory Visit

## 2024-03-17 ENCOUNTER — Other Ambulatory Visit

## 2024-03-21 ENCOUNTER — Ambulatory Visit
Admission: RE | Admit: 2024-03-21 | Discharge: 2024-03-21 | Disposition: A | Discharge status: Home / Self Care | Source: Ambulatory Visit | Attending: Nurse Practitioner | Admitting: Nurse Practitioner

## 2024-03-21 DIAGNOSIS — Z1231 Encounter for screening mammogram for malignant neoplasm of breast: Secondary | ICD-10-CM

## 2024-03-22 ENCOUNTER — Encounter

## 2024-03-24 ENCOUNTER — Encounter: Admitting: Speech Pathology

## 2024-03-25 ENCOUNTER — Encounter: Payer: Self-pay | Admitting: Nurse Practitioner

## 2024-03-29 ENCOUNTER — Encounter: Payer: Self-pay | Admitting: Nurse Practitioner

## 2024-03-29 ENCOUNTER — Ambulatory Visit: Admitting: Neurology

## 2024-03-29 ENCOUNTER — Encounter

## 2024-03-29 ENCOUNTER — Encounter: Payer: Self-pay | Admitting: Neurology

## 2024-03-29 VITALS — BP 133/81 | HR 73 | Ht 66.0 in | Wt 279.0 lb

## 2024-03-29 DIAGNOSIS — M7918 Myalgia, other site: Secondary | ICD-10-CM | POA: Insufficient documentation

## 2024-03-29 DIAGNOSIS — G44309 Post-traumatic headache, unspecified, not intractable: Secondary | ICD-10-CM | POA: Diagnosis not present

## 2024-03-29 MED ORDER — GABAPENTIN 300 MG PO CAPS: 600.0000 mg | ORAL_CAPSULE | Freq: Three times a day (TID) | ORAL | 4 refills | Status: DC

## 2024-03-29 MED ORDER — LIDOCAINE 5 % EX PTCH: 1.0000 | MEDICATED_PATCH | CUTANEOUS | 0 refills | Status: DC

## 2024-03-29 NOTE — Patient Instructions (Addendum)
 Dry needling for cervical and lumbar myofascial pain, just finished PT for both but didn;t get dry needling at this time can refer back just for dry needling and/or any other intervention  Lidocaine  patch Increase gabapentin  Continue to Use TENS unit  Dr. [facility]) for evaluation of neck pain and lumbar pain possibly trigger point lideocain injections (patient has had them in the past and did well)  Can also increase Cymbalta  Can consider treating with migraine medications, we have some new medications such as Ajovy/Emgality.Nurtec/Ubrelvy and also botox or other treatments   Meds ordered this encounter  Medications   gabapentin  (NEURONTIN ) 300 MG capsule    Sig: Take 2 capsules (600 mg total) by mouth 3 (three) times daily.    Dispense:  540 capsule    Refill:  4   lidocaine  (LIDODERM ) 5 %    Sig: Place 1 patch onto the skin daily. Remove & Discard patch within 12 hours or as directed by MD    Dispense:  30 patch    Refill:  0   Gabapentin  Capsules or Tablets What is this medication? GABAPENTIN  (GA ba pen tin) treats nerve pain. It may also be used to prevent and control seizures in people with epilepsy. It works by calming overactive nerves in your body. This medicine may be used for other purposes; ask your health care provider or pharmacist if you have questions. COMMON BRAND NAME(S): Active-PAC with Gabapentin , Gabarone, Gralise , Neurontin  What should I tell my care team before I take this medication? They need to know if you have any of these conditions: Kidney disease Lung or breathing disease Substance use disorder Suicidal thoughts, plans, or attempt by you or a family member An unusual or allergic reaction to gabapentin , other medications, foods, dyes, or preservatives Pregnant or trying to get pregnant Breastfeeding How should I use this medication? Take this medication by mouth with a glass of water. Follow the directions on the prescription label. You  can take it with or without food. If it upsets your stomach, take it with food. Take your medication at regular intervals. Do not take it more often than directed. Do not stop taking except on your care team's advice. If you are directed to break the 600 or 800 mg tablets in half as part of your dose, the extra half tablet should be used for the next dose. If you have not used the extra half tablet within 28 days, it should be thrown away. A special MedGuide will be given to you by the pharmacist with each prescription and refill. Be sure to read this information carefully each time. Talk to your care team about the use of this medication in children. While this medication may be prescribed for children as young as 3 years for selected conditions, precautions do apply. Overdosage: If you think you have taken too much of this medicine contact a poison control center or emergency room at once. NOTE: This medicine is only for you. Do not share this medicine with others. What if I miss a dose? If you miss a dose, take it as soon as you can. If it is almost time for your next dose, take only that dose. Do not take double or extra doses. What may interact with this medication? Alcohol Antihistamines for allergy, cough, and cold Certain medications for anxiety or sleep Certain medications for depression like amitriptyline, fluoxetine, sertraline Certain medications for seizures like phenobarbital, primidone Certain medications for stomach problems General anesthetics like halothane, isoflurane, methoxyflurane,  propofol  Local anesthetics like lidocaine , pramoxine, tetracaine Medications that relax muscles for surgery Opioid medications for pain Phenothiazines like chlorpromazine, mesoridazine, prochlorperazine, thioridazine This list may not describe all possible interactions. Give your health care provider a list of all the medicines, herbs, non-prescription drugs, or dietary supplements you use. Also  tell them if you smoke, drink alcohol, or use illegal drugs. Some items may interact with your medicine. What should I watch for while using this medication? Visit your care team for regular checks on your progress. You may want to keep a record at home of how you feel your condition is responding to treatment. You may want to share this information with your care team at each visit. You should contact your care team if your seizures get worse or if you have any new types of seizures. Do not stop taking this medication or any of your seizure medications unless instructed by your care team. Stopping your medication suddenly can increase your seizures or their severity. This medication may cause serious skin reactions. They can happen weeks to months after starting the medication. Contact your care team right away if you notice fevers or flu-like symptoms with a rash. The rash may be red or purple and then turn into blisters or peeling of the skin. Or, you might notice a red rash with swelling of the face, lips or lymph nodes in your neck or under your arms. Wear a medical identification bracelet or chain if you are taking this medication for seizures. Carry a card that lists all your medications. This medication may affect your coordination, reaction time, or judgment. Do not drive or operate machinery until you know how this medication affects you. Sit up or stand slowly to reduce the risk of dizzy or fainting spells. Drinking alcohol with this medication can increase the risk of these side effects. Your mouth may get dry. Chewing sugarless gum or sucking hard candy, and drinking plenty of water may help. Watch for new or worsening thoughts of suicide or depression. This includes sudden changes in mood, behaviors, or thoughts. These changes can happen at any time but are more common in the beginning of treatment or after a change in dose. Call your care team right away if you experience these thoughts or  worsening depression. If you become pregnant while using this medication, you may enroll in the [place] [employer] Antiepileptic Drug Pregnancy Registry by calling [phone]. This registry collects information about the safety of antiepileptic medication use during pregnancy. What side effects may I notice from receiving this medication? Side effects that you should report to your care team as soon as possible: Allergic reactions or angioedema--skin rash, itching, hives, swelling of the face, eyes, lips, tongue, arms, or legs, trouble swallowing or breathing Rash, fever, and swollen lymph nodes Thoughts of suicide or self harm, worsening mood, feelings of depression Trouble breathing Unusual changes in mood or behavior in children after use such as difficulty concentrating, hostility, or restlessness Side effects that usually do not require medical attention (report to your care team if they continue or are bothersome): Dizziness Drowsiness Nausea Swelling of ankles, feet, or hands Vomiting This list may not describe all possible side effects. Call your doctor for medical advice about side effects. You may report side effects to FDA at 1-800-FDA-1088. Where should I keep my medication? Keep out of reach of children and pets. Store at room temperature between 15 and 30 degrees C (59 and 86 degrees F). Get rid of any unused  medication after the expiration date. This medication may cause accidental overdose and death if taken by other adults, children, or pets. To get rid of medications that are no longer needed or have expired: Take the medication to a medication take-back program. Check with your pharmacy or law enforcement to find a location. If you cannot return the medication, check the label or package insert to see if the medication should be thrown out in the garbage or flushed down the toilet. If you are not sure, ask your care team. If it is safe to put it in the trash, empty the  medication out of the container. Mix the medication with cat litter, dirt, coffee grounds, or other unwanted substance. Seal the mixture in a bag or container. Put it in the trash. NOTE: This sheet is a summary. It may not cover all possible information. If you have questions about this medicine, talk to your doctor, pharmacist, or health care provider.  2024 Elsevier/Gold Standard (2022-09-02 00:00:00)Lidocaine  Patches What is this medication? LIDOCAINE  (LYE doe kane) treats pain. It works by numbing a specific area of the body, which blocks pain signals going to the brain. It belongs to a group of medications called local anesthetics. This medicine may be used for other purposes; ask your health care provider or pharmacist if you have questions. COMMON BRAND NAME(S): ALOCANE, Aspercreme with Lidocaine , AsperFlex, Blue-Emu, DermacinRx Lidocan, DermacinRx Lidocan II, DermacinRx Lidocan III, DermacinRx Lidocan IV , DermacinRx Lidocan V , DermacinRx Lidocan VI, DermacinRx Lidocan VII, DermacinRx Lidotral , DermacinRx PHN Pak , DERMALID, GEN7T, LidaFlex, Lido King Maximum Strength, Lidocan, LIDOCANNA, Lidocare, Lidoderm , Lidofore, LidoPure, LidoReal-30, Lidotrode, Lidozo, Salonpas Lidocaine , Tridacaine , Ultra Lido, Xyliderm , ZTlido , Zylotrol What should I tell my care team before I take this medication? They need to know if you have any of these conditions: G6PD deficiency Heart disease Kidney disease Liver disease Skin conditions or sensitivity An unusual or allergic reaction to lidocaine , parabens, other medications, foods, dyes, or preservatives Pregnant or trying to get pregnant Breast-feeding How should I use this medication? This medication is for external use only. Use it as directed on the prescription label. Do not apply to burned or damaged skin. Do not use it more often than directed. Keep using it unless your care team tells you to stop. Talk to your care team about the use of this  medication in children. Special care may be needed. Overdosage: If you think you have taken too much of this medicine contact a poison control center or emergency room at once. NOTE: This medicine is only for you. Do not share this medicine with others. What if I miss a dose? If you miss a dose, use it as soon as you can. If it is almost time for your next dose, use only that dose. Do not use double or extra doses. What may interact with this medication? This medication may interact with the following: Acetaminophen  Certain antibiotics, such as dapsone, nitrofurantoin, aminosalicylic acid, sulfasalazine Certain medications for seizures, such as phenobarbital, phenytoin, valproic acid Chloroquine Cyclophosphamide Dofetilide Flutamide Hydroxyurea Ifosfamide Local anesthetics, such as lidocaine , pramoxine, tetracaine MAOIs, such as Carbex, Eldepryl, Marplan, Nardil, and Parnate Metoclopramide  Moricizine Nitroglycerin Primaquine Saquinavir Quinine This list may not describe all possible interactions. Give your health care provider a list of all the medicines, herbs, non-prescription drugs, or dietary supplements you use. Also tell them if you smoke, drink alcohol, or use illegal drugs. Some items may interact with your medicine. What should I watch for while using this medication? Visit  your care team for regular checks on your progress. Tell your care team if your symptoms do not start to get better or if they get worse. Be careful to avoid injury while the area is numb, and you are not aware of pain. If you are going to need surgery, an MRI, CT scan, or other procedure, tell your care team that you are using this medication. You may need to remove the patch before the procedure. What side effects may I notice from receiving this medication? Side effects that you should report to your care team as soon as possible: Allergic reactions--skin rash, itching, hives, swelling of the face, lips,  tongue, or throat Headache, unusual weakness or fatigue, shortness of breath, nausea, vomiting, rapid heartbeat, blue skin or lips, which may be signs of methemoglobinemia Heart rhythm changes--fast or irregular heartbeat, dizziness, feeling faint or lightheaded, chest pain, trouble breathing Side effects that usually do not require medical attention (report to your care team if they continue or are bothersome): Change in skin color Irritation at application site This list may not describe all possible side effects. Call your doctor for medical advice about side effects. You may report side effects to FDA at 1-800-FDA-1088. Where should I keep my medication? Keep out of the reach of children and pets. See product for storage information. Each product may have different instructions. Get rid of any unused medication after the expiration date. To get rid of medications that are no longer needed or have expired: Take the medication to a medication take-back program. Check with your pharmacy or law enforcement to find a location. If you cannot return the medication, ask your pharmacist or care team how to get rid of this medication safely. NOTE: This sheet is a summary. It may not cover all possible information. If you have questions about this medicine, talk to your doctor, pharmacist, or health care provider.  2024 Elsevier/Gold Standard (2023-01-06 00:00:00)
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
## 2024-03-30 ENCOUNTER — Telehealth: Payer: Self-pay | Admitting: Neurology

## 2024-03-30 NOTE — Telephone Encounter (Signed)
 Referral for physical therapy fax to [facility]. Phone: [phone], Fax: [phone]

## 2024-03-31 ENCOUNTER — Encounter: Admitting: Speech Pathology

## 2024-04-05 ENCOUNTER — Encounter

## 2024-04-06 ENCOUNTER — Other Ambulatory Visit: Payer: Self-pay

## 2024-04-06 MED ORDER — CLOTRIMAZOLE-BETAMETHASONE 1-0.05 % EX CREA: TOPICAL_CREAM | Freq: Two times a day (BID) | CUTANEOUS | 0 refills | Status: DC

## 2024-04-06 NOTE — Telephone Encounter (Signed)
 Requesting: Clotrimazole -Betamethasone  Cream Last Visit: 03/15/2024 Next Visit: 05/10/2024 Last Refill: 12/23/2023  Please Advise

## 2024-04-12 ENCOUNTER — Encounter

## 2024-04-14 ENCOUNTER — Encounter: Admitting: Speech Pathology

## 2024-04-18 ENCOUNTER — Ambulatory Visit
Admission: RE | Admit: 2024-04-18 | Discharge: 2024-04-18 | Disposition: A | Discharge status: Home / Self Care | Source: Ambulatory Visit | Attending: Nurse Practitioner | Admitting: Nurse Practitioner

## 2024-04-18 DIAGNOSIS — M48061 Spinal stenosis, lumbar region without neurogenic claudication: Secondary | ICD-10-CM | POA: Diagnosis not present

## 2024-04-18 DIAGNOSIS — Z9884 Bariatric surgery status: Secondary | ICD-10-CM | POA: Diagnosis not present

## 2024-04-18 DIAGNOSIS — Z6841 Body Mass Index (BMI) 40.0 and over, adult: Secondary | ICD-10-CM | POA: Diagnosis not present

## 2024-04-18 DIAGNOSIS — M5126 Other intervertebral disc displacement, lumbar region: Secondary | ICD-10-CM | POA: Diagnosis not present

## 2024-04-18 DIAGNOSIS — G8929 Other chronic pain: Secondary | ICD-10-CM

## 2024-04-18 DIAGNOSIS — Z713 Dietary counseling and surveillance: Secondary | ICD-10-CM | POA: Diagnosis not present

## 2024-04-18 DIAGNOSIS — M4316 Spondylolisthesis, lumbar region: Secondary | ICD-10-CM | POA: Diagnosis not present

## 2024-04-18 DIAGNOSIS — K912 Postsurgical malabsorption, not elsewhere classified: Secondary | ICD-10-CM | POA: Diagnosis not present

## 2024-04-18 MED ORDER — GADOPICLENOL 0.5 MMOL/ML IV SOLN
10.0000 mL | Freq: Once | INTRAVENOUS | Status: AC | PRN
  Administered 2024-04-18: 10 mL via INTRAVENOUS

## 2024-04-19 ENCOUNTER — Encounter

## 2024-04-19 ENCOUNTER — Ambulatory Visit: Admitting: Physical Therapy

## 2024-04-27 ENCOUNTER — Encounter: Payer: Self-pay | Admitting: Nurse Practitioner

## 2024-04-29 ENCOUNTER — Ambulatory Visit: Payer: Self-pay | Admitting: Nurse Practitioner

## 2024-05-03 ENCOUNTER — Other Ambulatory Visit: Payer: Self-pay

## 2024-05-03 ENCOUNTER — Other Ambulatory Visit: Payer: Self-pay | Admitting: Medical Genetics

## 2024-05-03 ENCOUNTER — Encounter: Payer: Self-pay | Admitting: Physical Therapy

## 2024-05-03 ENCOUNTER — Ambulatory Visit: Attending: Orthopedic Surgery | Admitting: Physical Therapy

## 2024-05-03 DIAGNOSIS — M5459 Other low back pain: Secondary | ICD-10-CM | POA: Insufficient documentation

## 2024-05-03 DIAGNOSIS — M542 Cervicalgia: Secondary | ICD-10-CM | POA: Insufficient documentation

## 2024-05-03 DIAGNOSIS — M6281 Muscle weakness (generalized): Secondary | ICD-10-CM | POA: Insufficient documentation

## 2024-05-03 DIAGNOSIS — R29898 Other symptoms and signs involving the musculoskeletal system: Secondary | ICD-10-CM | POA: Insufficient documentation

## 2024-05-03 DIAGNOSIS — M7918 Myalgia, other site: Secondary | ICD-10-CM | POA: Insufficient documentation
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
## 2024-05-04 ENCOUNTER — Telehealth: Payer: Self-pay | Admitting: Neurology

## 2024-05-04 DIAGNOSIS — M545 Low back pain, unspecified: Secondary | ICD-10-CM

## 2024-05-04 NOTE — Telephone Encounter (Signed)
 Contacted patient to schedule an appointment. Relayed Dr. Harding Li next available. Patient said need earlier appointment than October 28. Patient stated, PCP messaged to get in touch with neurologist and schedule appointment due to significant changes since last MRI;shoiws compression of nerve in spine.  Would like a call back.

## 2024-05-04 NOTE — Telephone Encounter (Signed)
 LVM at 4:36 pm 05/03/24. Calling to make an appointment with Dr. Aldona Amel. I had a MRI done and my doctor referred me. I already have her as a neurologist and my doctor just recommended that I speak to my neurologist and let her review the MRI.

## 2024-05-04 NOTE — Telephone Encounter (Signed)
 I reviewed the pt's chart. She saw pcp in April and reported issues with back pain and radicular symptoms. MRI lumbar spine ordered by pcp. Pt's provider notified pt in mychart that MRI lumbar spine showed pinched nerve and that neurosurgery is recommended. I called the pt. She misunderstood and thought neurology is who she needed to see. Pt said Dr Vaughn Georges (ortho) had performed surgery on her neck. I advised her to get back in touch with Ms Ricke Charleston to arrange for referral to either Dr Vaughn Georges or neurosurgery if pcp felt that was the recommendation. Patient was very appreciative for the call and clarification.

## 2024-05-05 NOTE — Telephone Encounter (Addendum)
 I am Referral Coordinator  have a referral for neurosurgery created by your provider in [facility] workqueue.I think your office sends this referral.  I do not know how to send back to your office.

## 2024-05-05 NOTE — Telephone Encounter (Signed)
 I called and spoke with patient and notified her that referral was placed and if no call in the the next week to call the office back to check on referral.

## 2024-05-05 NOTE — Telephone Encounter (Signed)
 Left message for patient to return call.

## 2024-05-05 NOTE — Addendum Note (Signed)
 Addended by: Abran Gavigan A on: 05/05/2024 01:08 PM   Modules accepted: Orders

## 2024-05-05 NOTE — Telephone Encounter (Signed)
 Copied from CRM [id number]. Topic: General - Other >> May 05, 2024  9:39 AM Allyne Areola wrote: Reason for CRM: Patient is returning a call she received from Cherylann Corpus, Winfred Hausen, I asked if patient needed the referral to neurosurgery for her back and she said yes.

## 2024-05-07 ENCOUNTER — Other Ambulatory Visit: Payer: Self-pay | Admitting: Nurse Practitioner

## 2024-05-09 ENCOUNTER — Encounter: Payer: Self-pay | Admitting: Nurse Practitioner

## 2024-05-09 NOTE — Telephone Encounter (Signed)
 Noted

## 2024-05-10 ENCOUNTER — Encounter: Payer: Self-pay | Admitting: Nurse Practitioner

## 2024-05-10 ENCOUNTER — Ambulatory Visit (INDEPENDENT_AMBULATORY_CARE_PROVIDER_SITE_OTHER): Admitting: Nurse Practitioner

## 2024-05-10 VITALS — BP 122/82 | HR 80 | Temp 96.8°F | Ht 66.0 in | Wt 272.0 lb

## 2024-05-10 DIAGNOSIS — Z9884 Bariatric surgery status: Secondary | ICD-10-CM

## 2024-05-10 DIAGNOSIS — Z0000 Encounter for general adult medical examination without abnormal findings: Secondary | ICD-10-CM | POA: Diagnosis not present

## 2024-05-10 DIAGNOSIS — F419 Anxiety disorder, unspecified: Secondary | ICD-10-CM

## 2024-05-10 DIAGNOSIS — R232 Flushing: Secondary | ICD-10-CM | POA: Diagnosis not present

## 2024-05-10 DIAGNOSIS — I10 Essential (primary) hypertension: Secondary | ICD-10-CM | POA: Diagnosis not present

## 2024-05-10 DIAGNOSIS — R7303 Prediabetes: Secondary | ICD-10-CM | POA: Diagnosis not present

## 2024-05-10 DIAGNOSIS — M5416 Radiculopathy, lumbar region: Secondary | ICD-10-CM

## 2024-05-10 DIAGNOSIS — F32A Depression, unspecified: Secondary | ICD-10-CM | POA: Diagnosis not present

## 2024-05-10 DIAGNOSIS — Z01812 Encounter for preprocedural laboratory examination: Secondary | ICD-10-CM | POA: Diagnosis not present

## 2024-05-10 DIAGNOSIS — E559 Vitamin D deficiency, unspecified: Secondary | ICD-10-CM | POA: Diagnosis not present

## 2024-05-10 DIAGNOSIS — K219 Gastro-esophageal reflux disease without esophagitis: Secondary | ICD-10-CM

## 2024-05-10 MED ORDER — CLONIDINE 0.1 MG/24HR TD PTWK: 0.1000 mg | MEDICATED_PATCH | TRANSDERMAL | 1 refills | Status: DC

## 2024-05-10 NOTE — Assessment & Plan Note (Signed)
 Chronic, ongoing. She is following with neurosurgery and has plan for injection tomorrow.

## 2024-05-10 NOTE — Assessment & Plan Note (Signed)
 History of gastric sleeve in 2015. She is following with [facility]. Will update vitamin levels today: B12, folate, D, zinc , vitamin A , vitamin K today.

## 2024-05-10 NOTE — Assessment & Plan Note (Signed)
Health maintenance reviewed and updated. Discussed nutrition, exercise. Follow-up 1 year.

## 2024-05-10 NOTE — Patient Instructions (Signed)
 It was great to see you!  Start pepcid once a day   We are checking your labs today and will let you know the results via mychart/phone.   I have refilled the clonidine  patch   Let's follow-up in 6 months, sooner if you have concerns.  If a referral was placed today, you will be contacted for an appointment. Please note that routine referrals can sometimes take up to 3-4 weeks to process. Please call our office if you haven't heard anything after this time frame.  Take care,  Rheba Cedar, NP

## 2024-05-10 NOTE — Assessment & Plan Note (Signed)
 Chronic, ongoing. Continue cymbalta  60mg  daily and klonopin  0.5mg  daily at bedtime as needed.

## 2024-05-10 NOTE — Assessment & Plan Note (Signed)
Check vitamin D levels and treat based on results.

## 2024-05-10 NOTE — Addendum Note (Signed)
 Addended by: Genevive Ket D on: 05/10/2024 01:43 PM   Modules accepted: Orders

## 2024-05-10 NOTE — Assessment & Plan Note (Signed)
 Chronic, stable. Continue losartan -hctz 50-12.5mg  daily as needed. Check CMP, CBC, lipid panel. Follow-up in 6 months.

## 2024-05-10 NOTE — Assessment & Plan Note (Signed)
 Needs labs for presurgical bariatric surgery revision. Check zinc , B1, B12, TSH, T4, PTH, vitamin K, folate today.

## 2024-05-10 NOTE — Telephone Encounter (Signed)
 Requesting: GABAPENTIN  300MG  CAPSULES  Last Visit: 03/15/2024 Next Visit: 05/10/2024 Last Refill: 03/29/2024 by Rudolfo Cosier, MD  Please Advise

## 2024-05-10 NOTE — Assessment & Plan Note (Signed)
 Hot flashes have improved on clonidine  patch. Continue clonidine  patch 0.1mg  weekly.

## 2024-05-10 NOTE — Progress Notes (Signed)
 BP 122/82 (BP Location: Left Arm, Patient Position: Sitting, Cuff Size: Large)   Pulse 80   Temp (!) 96.8 F (36 C)   Ht 5\' 6"  (1.676 m)   Wt 272 lb (123.4 kg)   LMP  (LMP Unknown)   SpO2 97%   BMI 43.90 kg/m    Subjective:    Patient ID: Kim Watson, female    DOB: 1971-07-23, 53 y.o.   MRN: [id number]  CC: Chief Complaint  Patient presents with   Annual Exam    With fasting lab work, no concerns    HPI: Kim Watson is a 53 y.o. female presenting on 05/10/2024 for comprehensive medical examination. Current medical complaints include:none  Menopausal Symptoms: yes - hot flashes - imrpoving  Depression and Anxiety Screen done today and results listed below:     05/10/2024    8:34 AM 03/15/2024   11:33 AM 02/04/2024   10:52 AM 09/10/2023    2:53 PM 05/15/2023   11:56 AM  Depression screen PHQ 2/9  Decreased Interest 2 3 3 3 2   Down, Depressed, Hopeless 2 1 1 3 2   PHQ - 2 Score 4 4 4 6 4   Altered sleeping 2 3 3 3 2   Tired, decreased energy 2 3 3 3 2   Change in appetite 2 1 3 3 2   Feeling bad or failure about yourself  2 1 0 3 0  Trouble concentrating 2 1 3 3  0  Moving slowly or fidgety/restless 0 2 3 3 1   Suicidal thoughts 0 0 0 0 0  PHQ-9 Score 14 15 19 24 11   Difficult doing work/chores Somewhat difficult Very difficult Very difficult  Somewhat difficult      05/10/2024    8:35 AM 03/15/2024   11:34 AM 02/04/2024   10:52 AM 09/10/2023    2:53 PM  GAD 7 : Generalized Anxiety Score  Nervous, Anxious, on Edge 0 3 1 3   Control/stop worrying 2 1 1 3   Worry too much - different things 1 2 1 3   Trouble relaxing 1 3 1 3   Restless 0 2 2 3   Easily annoyed or irritable 3 3 2 3   Afraid - awful might happen 0 2 0 1  Total GAD 7 Score 7 16 8 19   Anxiety Difficulty Somewhat difficult Somewhat difficult      The patient does not have a history of falls. I did not complete a risk assessment for falls. A plan of care for falls was not documented.   Past Medical  History:  Past Medical History:  Diagnosis Date   Anxiety    Arthritis    Depression    Gallbladder problem    GERD (gastroesophageal reflux disease)    High blood pressure    Hypertension    Migraine    Obesity    Obstructive sleep apnea    2016 was cleared from having to use CPAP at night. Gastric bypass surgery 2015.   Vitamin D  deficiency     Surgical History:  Past Surgical History:  Procedure Laterality Date   ANTERIOR CERVICAL DECOMP/DISCECTOMY FUSION N/A 10/02/2022   Procedure: ANTERIOR CERVICAL DISCECTOMY AND FUSION CERVICAL FOUR TO SEVEN;  Surgeon: Mort Ards, MD;  Location: [facility] OR;  Service: Orthopedics;  Laterality: N/A;  4hrs 3 C-Bed   CARPAL TUNNEL RELEASE Left 12/2023   CHOLECYSTECTOMY     FOOT SURGERY Left 07/26/2022   5th metatarsal   SLEEVE GASTROPLASTY     TUBAL LIGATION  Medications:  Current Outpatient Medications on File Prior to Visit  Medication Sig   Acetaminophen  (TYLENOL  ARTHRITIS PAIN PO) Take by mouth as needed.   clonazePAM  (KLONOPIN ) 0.5 MG disintegrating tablet DISSOLVE 1 TABLET(0.5 MG) ON THE TONGUE TWICE DAILY (Patient taking differently: As needed)   clotrimazole -betamethasone  (LOTRISONE ) cream Apply topically 2 (two) times daily.   cyclobenzaprine  (FLEXERIL ) 10 MG tablet Take 10 mg by mouth at bedtime.   DULoxetine  (CYMBALTA ) 60 MG capsule Take 1 capsule (60 mg total) by mouth daily.   fluticasone  (FLONASE ) 50 MCG/ACT nasal spray Place 2 sprays into both nostrils daily.   gabapentin  (NEURONTIN ) 300 MG capsule Take 2 capsules (600 mg total) by mouth 3 (three) times daily.   losartan -hydrochlorothiazide  (HYZAAR) 50-12.5 MG tablet TAKE 1 TABLET BY MOUTH DAILY   ondansetron  (ZOFRAN ) 4 MG tablet Take 1 tablet (4 mg total) by mouth every 8 (eight) hours as needed for nausea or vomiting.   pantoprazole  (PROTONIX ) 40 MG tablet TAKE 1 TABLET(40 MG) BY MOUTH DAILY   Semaglutide -Weight Management 0.25 MG/0.5ML SOAJ Inject 0.25 mg into the  skin.   acetaminophen -codeine  (TYLENOL  #3) 300-30 MG tablet Take 1 tablet by mouth every 6 (six) hours as needed for moderate pain (pain score 4-6). (Patient not taking: Reported on 05/10/2024)   lidocaine  (LIDODERM ) 5 % Place 1 patch onto the skin daily. Remove & Discard patch within 12 hours or as directed by MD   No current facility-administered medications on file prior to visit.    Allergies:  Allergies  Allergen Reactions   Nsaids Other (See Comments)    History of gastric bypass   Zestril [Lisinopril] Cough         Social History:  Social History   Socioeconomic History   Marital status: Significant Other    Spouse name: Not on file   Number of children: 3   Years of education: Not on file   Highest education level: Some college, no degree  Occupational History   Occupation: Waitress  Tobacco Use   Smoking status: Never    Passive exposure: Never   Smokeless tobacco: Never  Vaping Use   Vaping status: Never Used  Substance and Sexual Activity   Alcohol use: Never    Comment: socially in the past   Drug use: No   Sexual activity: Yes    Birth control/protection: Surgical  Other Topics Concern   Not on file  Social History Narrative   Right handed   Lives with significant other   Caffeine: sometimes decaf coffee   Social Drivers of Corporate investment banker Strain: Patient Declined (05/09/2024)   Overall Financial Resource Strain (CARDIA)    Difficulty of Paying Living Expenses: Patient declined  Food Insecurity: Patient Declined (05/09/2024)   Hunger Vital Sign    Worried About Running Out of Food in the Last Year: Patient declined    Ran Out of Food in the Last Year: Patient declined  Transportation Needs: No Transportation Needs (05/09/2024)   PRAPARE - Administrator, Civil Service (Medical): No    Lack of Transportation (Non-Medical): No  Physical Activity: Inactive (05/09/2024)   Exercise Vital Sign    Days of Exercise per Week: 0 days     Minutes of Exercise per Session: 20 min  Stress: Stress Concern Present (05/09/2024)   [employer] of Occupational Health - Occupational Stress Questionnaire    Feeling of Stress : Very much  Social Connections: Socially Integrated (05/09/2024)   Social Connection and  Isolation Panel [NHANES]    Frequency of Communication with Friends and Family: Twice a week    Frequency of Social Gatherings with Friends and Family: More than three times a week    Attends Religious Services: More than 4 times per year    Active Member of [employer] or Organizations: No    Attends Engineer, structural: More than 4 times per year    Marital Status: Married  Catering manager Violence: Unknown (03/06/2022)   Received from [employer], [facility]   HITS    Physically Hurt: Not on file    Insult or Talk Down To: Not on file    Threaten Physical Harm: Not on file    Scream or Curse: Not on file   Social History   Tobacco Use  Smoking Status Never   Passive exposure: Never  Smokeless Tobacco Never   Social History   Substance and Sexual Activity  Alcohol Use Never   Comment: socially in the past    Family History:  Family History  Problem Relation Age of Onset   Diabetes Maternal Grandmother    Heart disease Maternal Grandmother    Hypertension Maternal Great-grandmother    Hyperlipidemia Maternal Great-grandmother    Diabetes Niece    Diabetes Niece    BRCA 1/2 Neg Hx    Breast cancer Neg Hx    Migraines Neg Hx     Past medical history, surgical history, medications, allergies, family history and social history reviewed with patient today and changes made to appropriate areas of the chart.   Review of Systems  Constitutional:  Positive for malaise/fatigue. Negative for fever.  HENT: Negative.    Eyes: Negative.   Respiratory: Negative.    Cardiovascular: Negative.   Gastrointestinal:  Positive for constipation and heartburn. Negative for diarrhea.  Genitourinary: Negative.    Musculoskeletal:  Positive for back pain.  Skin: Negative.   Neurological: Negative.   Psychiatric/Behavioral:  The patient is nervous/anxious.    All other ROS negative except what is listed above and in the HPI.      Objective:     BP 122/82 (BP Location: Left Arm, Patient Position: Sitting, Cuff Size: Large)   Pulse 80   Temp (!) 96.8 F (36 C)   Ht 5\' 6"  (1.676 m)   Wt 272 lb (123.4 kg)   LMP  (LMP Unknown)   SpO2 97%   BMI 43.90 kg/m   Wt Readings from Last 3 Encounters:  05/10/24 272 lb (123.4 kg)  03/29/24 279 lb (126.6 kg)  03/15/24 279 lb (126.6 kg)    Physical Exam Vitals and nursing note reviewed.  Constitutional:      General: She is not in acute distress.    Appearance: Normal appearance.  HENT:     Head: Normocephalic and atraumatic.     Right Ear: Tympanic membrane, ear canal and external ear normal.     Left Ear: Tympanic membrane, ear canal and external ear normal.     Mouth/Throat:     Mouth: Mucous membranes are moist.     Pharynx: No posterior oropharyngeal erythema.  Eyes:     Conjunctiva/sclera: Conjunctivae normal.  Cardiovascular:     Rate and Rhythm: Normal rate and regular rhythm.     Pulses: Normal pulses.     Heart sounds: Normal heart sounds.  Pulmonary:     Effort: Pulmonary effort is normal.     Breath sounds: Normal breath sounds.  Abdominal:     Palpations:  Abdomen is soft.     Tenderness: There is no abdominal tenderness.  Musculoskeletal:        General: Normal range of motion.     Cervical back: Normal range of motion and neck supple.     Right lower leg: No edema.     Left lower leg: No edema.  Lymphadenopathy:     Cervical: No cervical adenopathy.  Skin:    General: Skin is warm and dry.  Neurological:     General: No focal deficit present.     Mental Status: She is alert and oriented to person, place, and time.     Cranial Nerves: No cranial nerve deficit.     Coordination: Coordination normal.     Gait: Gait  normal.  Psychiatric:        Mood and Affect: Mood normal.        Behavior: Behavior normal.        Thought Content: Thought content normal.        Judgment: Judgment normal.     Results for orders placed or performed in visit on 01/05/24  Surgical pathology   Collection Time: 01/05/24 12:00 AM  Result Value Ref Range   SURGICAL PATHOLOGY      SURGICAL PATHOLOGY [facility] [address] [place], [place] [address] Telephone [phone] or [phone] Fax [phone]  REPORT OF DERMATOPATHOLOGY   Accession #: [id number] Patient Name: EMMERSON, SHUFFIELD Visit # : [id number]  MRN: [id number] Cytotechnologist: Zygmunt Hives, Dermatopathologist, Electronic Signature DOB/Age [birth year] (Age: 35) Gender: F Collected Date: 01/05/2024 Received Date: 01/05/2024  FINAL DIAGNOSIS       1. Skin, right elbow - posterior :       GRANULOMA ANNULARE       DATE SIGNED OUT: 01/07/2024 ELECTRONIC SIGNATURE : Depcik-Smith Md, Natalie, Dermatopathologist, Electronic Signature  MICROSCOPIC DESCRIPTION 1. In the dermis is a perivascular and interstitial inflammatory infiltrate with a palisaded pattern of histiocytes around areas of collagen degeneration and mucin deposits. This correlates with granuloma annulare.  CASE COMMENTS STAINS USED IN DIAGNOSIS: H&E    CLINICAL H ISTORY  SPECIMEN(S) OBTAINED 1. Skin, Right Elbow - Posterior  SPECIMEN COMMENTS: 1. Violaceous erythematous raised plaques SPECIMEN CLINICAL INFORMATION: 1. Neoplasm of uncertain behavior of skin, granuloma annulare vs psoriasis vs rash    Gross Description 1. Formalin fixed specimen received:  3 X 3 X 5 MM, TOTO (1 P) (1 B) ( QB )        Report signed out from the following location(s) [place] [address], [place] [address] CLIA #: [id number]  [facility] [address] [place], [place] [address] CLIA #:  [id number]       Assessment & Plan:   Problem List Items Addressed This Visit       Cardiovascular and Mediastinum   Primary hypertension   Chronic, stable. Continue losartan -hctz 50-12.5mg  daily as needed. Check CMP, CBC, lipid panel. Follow-up in 6 months.       Relevant Medications   cloNIDine  (CATAPRES  - DOSED IN MG/24 HR) 0.1 mg/24hr patch   Other Relevant Orders   CBC with Differential/Platelet   Comprehensive metabolic panel with GFR   Lipid panel   Lipoprotein A (LPA)   Hot flashes   Hot flashes have improved on clonidine  patch. Continue clonidine  patch 0.1mg  weekly.       Relevant Medications   cloNIDine  (CATAPRES  - DOSED IN MG/24 HR)  0.1 mg/24hr patch     Digestive   Gastroesophageal reflux disease   Chronic, ongoing. Start pepcid 20mg  in addition to pantoprazole  40mg  daily.         Nervous and Auditory   Lumbar radiculopathy   Chronic, ongoing. She is following with neurosurgery and has plan for injection tomorrow.         Other   Vitamin D  deficiency   Check vitamin D  levels and treat based on results      Relevant Orders   VITAMIN D  25 Hydroxy (Vit-D Deficiency, Fractures)   Prediabetes   Chronic, stable. Check A1c today and treat based on results.       Relevant Orders   Hemoglobin A1c   History of bariatric surgery   History of gastric sleeve in 2015. She is following with [facility]. Will update vitamin levels today: B12, folate, D, zinc , vitamin A , vitamin K today.       Relevant Orders   Folate   Iron, TIBC and Ferritin Panel   Vitamin A    Vitamin B1   Vitamin B12   TSH   PTH, intact (no Ca)   Zinc    Phosphorus   Magnesium    Vitamin K1 , Serum   Anxiety and depression   Chronic, ongoing. Continue cymbalta  60mg  daily and klonopin  0.5mg  daily at bedtime as needed.       Pre-procedure lab exam   Needs labs for presurgical bariatric surgery revision. Check zinc , B1, B12, TSH, T4, PTH, vitamin K, folate today.        Relevant Orders   TSH   PTH, intact (no Ca)   Nicotine  and Metabs, Urine   Routine general medical examination at a health care facility - Primary   Health maintenance reviewed and updated. Discussed nutrition, exercise. Follow-up 1 year.           Follow up plan: Return in about 6 months (around 11/09/2024) for chronic management .   LABORATORY TESTING:  - Pap smear: up to date  IMMUNIZATIONS:   - Tdap: Tetanus vaccination status reviewed: last tetanus booster within 10 years. - Influenza: Postponed to flu season - Pneumovax: Not applicable - Prevnar: Not applicable - HPV: Not applicable - Shingrix  vaccine: Up to date  SCREENING: -Mammogram: Up to date  - Colonoscopy: Up to date  - Bone Density: Not applicable   PATIENT COUNSELING:   Advised to take 1 mg of folate supplement per day if capable of pregnancy.   Sexuality: Discussed sexually transmitted diseases, partner selection, use of condoms, avoidance of unintended pregnancy  and contraceptive alternatives.   Advised to avoid cigarette smoking.  I discussed with the patient that most people either abstain from alcohol or drink within safe limits (<=14/week and <=4 drinks/occasion for males, <=7/weeks and <= 3 drinks/occasion for females) and that the risk for alcohol disorders and other health effects rises proportionally with the number of drinks per week and how often a drinker exceeds daily limits.  Discussed cessation/primary prevention of drug use and availability of treatment for abuse.   Diet: Encouraged to adjust caloric intake to maintain  or achieve ideal body weight, to reduce intake of dietary saturated fat and total fat, to limit sodium intake by avoiding high sodium foods and not adding table salt, and to maintain adequate dietary potassium and calcium preferably from fresh fruits, vegetables, and low-fat dairy products.    stressed the importance of regular exercise  Injury prevention: Discussed  safety belts, safety helmets, smoke  detector, smoking near bedding or upholstery.   Dental health: Discussed importance of regular tooth brushing, flossing, and dental visits.    NEXT PREVENTATIVE PHYSICAL DUE IN 1 YEAR. Return in about 6 months (around 11/09/2024) for chronic management .  Zubair Lofton A Shamona Wirtz

## 2024-05-10 NOTE — Assessment & Plan Note (Signed)
 Chronic, ongoing. Start pepcid 20mg  in addition to pantoprazole  40mg  daily.

## 2024-05-10 NOTE — Assessment & Plan Note (Signed)
Chronic, stable. Check A1c today and treat based on results.

## 2024-05-11 ENCOUNTER — Ambulatory Visit: Payer: Self-pay | Admitting: Nurse Practitioner

## 2024-05-11 LAB — PHOSPHORUS: Phosphorus: 4.7 mg/dL — ABNORMAL HIGH (ref 2.5–4.5)

## 2024-05-11 LAB — MAGNESIUM: Magnesium: 2.1 mg/dL (ref 1.5–2.5)

## 2024-05-14 ENCOUNTER — Other Ambulatory Visit: Payer: Self-pay | Admitting: Nurse Practitioner

## 2024-05-17 ENCOUNTER — Other Ambulatory Visit

## 2024-05-17 ENCOUNTER — Ambulatory Visit: Admitting: Physical Therapy

## 2024-05-17 DIAGNOSIS — Z006 Encounter for examination for normal comparison and control in clinical research program: Secondary | ICD-10-CM

## 2024-05-17 NOTE — Telephone Encounter (Signed)
 I called and spoke with patient and notified her of below message and she will follow up with Dr. Tresia Fruit.

## 2024-05-17 NOTE — Telephone Encounter (Signed)
 Requesting: VALACYCLOVIR  500MG  TABLETS  Last Visit: 05/10/2024 Next Visit: 11/08/2024 Last Refill: The original prescription was discontinued on 03/29/2024 by Glory Larsen, MD. Renewing this prescription may not be appropriate.   Please Advise

## 2024-05-18 LAB — CBC WITH DIFFERENTIAL/PLATELET
Absolute Lymphocytes: 1845 {cells}/uL (ref 850–3900)
Absolute Monocytes: 350 {cells}/uL (ref 200–950)
Basophils Absolute: 30 {cells}/uL (ref 0–200)
Basophils Relative: 0.6 %
Eosinophils Absolute: 210 {cells}/uL (ref 15–500)
Eosinophils Relative: 4.2 %
HCT: 46.8 % — ABNORMAL HIGH (ref 35.0–45.0)
Hemoglobin: 15 g/dL (ref 11.7–15.5)
MCH: 28.4 pg (ref 27.0–33.0)
MCHC: 32.1 g/dL (ref 32.0–36.0)
MCV: 88.5 fL (ref 80.0–100.0)
MPV: 10.3 fL (ref 7.5–12.5)
Monocytes Relative: 7 %
Neutro Abs: 2565 {cells}/uL (ref 1500–7800)
Neutrophils Relative %: 51.3 %
Platelets: 270 10*3/uL (ref 140–400)
RBC: 5.29 10*6/uL — ABNORMAL HIGH (ref 3.80–5.10)
RDW: 14.2 % (ref 11.0–15.0)
Total Lymphocyte: 36.9 %
WBC: 5 10*3/uL (ref 3.8–10.8)

## 2024-05-18 LAB — COMPREHENSIVE METABOLIC PANEL WITH GFR
AG Ratio: 1.6 (calc) (ref 1.0–2.5)
ALT: 41 U/L — ABNORMAL HIGH (ref 6–29)
AST: 29 U/L (ref 10–35)
Albumin: 4.4 g/dL (ref 3.6–5.1)
Alkaline phosphatase (APISO): 98 U/L (ref 37–153)
BUN: 18 mg/dL (ref 7–25)
CO2: 23 mmol/L (ref 20–32)
Calcium: 9.5 mg/dL (ref 8.6–10.4)
Chloride: 104 mmol/L (ref 98–110)
Creat: 0.79 mg/dL (ref 0.50–1.03)
Globulin: 2.8 g/dL (ref 1.9–3.7)
Glucose, Bld: 84 mg/dL (ref 65–99)
Potassium: 4.3 mmol/L (ref 3.5–5.3)
Sodium: 138 mmol/L (ref 135–146)
Total Bilirubin: 0.4 mg/dL (ref 0.2–1.2)
Total Protein: 7.2 g/dL (ref 6.1–8.1)
eGFR: 90 mL/min/{1.73_m2} (ref >=60)

## 2024-05-18 LAB — FOLATE: Folate: 13.7 ng/mL

## 2024-05-18 LAB — IRON,TIBC AND FERRITIN PANEL
%SAT: 21 % (ref 16–45)
Ferritin: 41 ng/mL (ref 16–232)
Iron: 75 ug/dL (ref 45–160)
TIBC: 351 ug/dL (ref 250–450)

## 2024-05-18 LAB — EXTRA SPECIMEN

## 2024-05-18 LAB — VITAMIN B12: Vitamin B-12: 373 pg/mL (ref 200–1100)

## 2024-05-18 LAB — TSH: TSH: 3.05 m[IU]/L

## 2024-05-18 LAB — HEMOGLOBIN A1C
Hgb A1c MFr Bld: 5.5 % (ref <5.7)
Mean Plasma Glucose: 111 mg/dL
eAG (mmol/L): 6.2 mmol/L

## 2024-05-18 LAB — VITAMIN K1, SERUM: Vitamin K: 267 pg/mL (ref 130–1500)

## 2024-05-18 LAB — LIPID PANEL
Cholesterol: 214 mg/dL — ABNORMAL HIGH (ref <200)
HDL: 48 mg/dL — ABNORMAL LOW (ref >=50)
LDL Cholesterol (Calc): 133 mg/dL — ABNORMAL HIGH
Non-HDL Cholesterol (Calc): 166 mg/dL — ABNORMAL HIGH (ref <130)
Total CHOL/HDL Ratio: 4.5 (calc) (ref <5.0)
Triglycerides: 191 mg/dL — ABNORMAL HIGH (ref <150)

## 2024-05-18 LAB — VITAMIN B1: Vitamin B1 (Thiamine): 11 nmol/L (ref 8–30)

## 2024-05-18 LAB — ZINC: Zinc: 80 ug/dL (ref 60–130)

## 2024-05-18 LAB — PARATHYROID HORMONE, INTACT (NO CA): PTH: 27 pg/mL (ref 16–77)

## 2024-05-18 LAB — VITAMIN A: Vitamin A (Retinoic Acid): 59 ug/dL (ref 38–98)

## 2024-05-18 LAB — LIPOPROTEIN A (LPA): Lipoprotein (a): 85 nmol/L — ABNORMAL HIGH (ref <75)

## 2024-05-18 LAB — VITAMIN D 25 HYDROXY (VIT D DEFICIENCY, FRACTURES): Vit D, 25-Hydroxy: 42 ng/mL (ref 30–100)

## 2024-05-19 ENCOUNTER — Ambulatory Visit: Admitting: Physical Therapy

## 2024-05-24 ENCOUNTER — Ambulatory Visit: Admitting: Physical Therapy

## 2024-05-24 NOTE — Telephone Encounter (Signed)
 I checked the referral. Pt's pcp placed the referral but it was placed inside of this phone note (which was started by our office due to patient call) and therefore has Dr Ines labeled as the encounter provider. It was not Dr Ines who entered the referral. Could this be replaced again by your office please?

## 2024-05-25 ENCOUNTER — Encounter: Payer: Self-pay | Admitting: Nurse Practitioner

## 2024-05-25 ENCOUNTER — Other Ambulatory Visit: Payer: Self-pay | Admitting: Nurse Practitioner

## 2024-05-25 DIAGNOSIS — R9431 Abnormal electrocardiogram [ECG] [EKG]: Secondary | ICD-10-CM

## 2024-05-25 DIAGNOSIS — M5416 Radiculopathy, lumbar region: Secondary | ICD-10-CM

## 2024-05-25 DIAGNOSIS — Z01812 Encounter for preprocedural laboratory examination: Secondary | ICD-10-CM

## 2024-05-25 DIAGNOSIS — Z9884 Bariatric surgery status: Secondary | ICD-10-CM

## 2024-05-25 DIAGNOSIS — R7989 Other specified abnormal findings of blood chemistry: Secondary | ICD-10-CM

## 2024-05-25 NOTE — Telephone Encounter (Signed)
 Noted  Referral placed.

## 2024-05-26 ENCOUNTER — Ambulatory Visit: Admitting: Physical Therapy

## 2024-05-26 NOTE — Telephone Encounter (Signed)
 See other mychart message.

## 2024-05-26 NOTE — Telephone Encounter (Signed)
 I called and spoke with patient and notified her of below message and scheduled lab and office visit appointments.

## 2024-05-26 NOTE — Addendum Note (Signed)
 Addended by: Orie Cuttino A on: 05/26/2024 04:57 PM   Modules accepted: Orders

## 2024-05-27 ENCOUNTER — Ambulatory Visit: Payer: Self-pay | Admitting: Nurse Practitioner

## 2024-05-27 ENCOUNTER — Other Ambulatory Visit (INDEPENDENT_AMBULATORY_CARE_PROVIDER_SITE_OTHER)

## 2024-05-27 ENCOUNTER — Encounter: Payer: Self-pay | Admitting: Nurse Practitioner

## 2024-05-27 DIAGNOSIS — Z01812 Encounter for preprocedural laboratory examination: Secondary | ICD-10-CM | POA: Diagnosis not present

## 2024-05-27 DIAGNOSIS — Z9884 Bariatric surgery status: Secondary | ICD-10-CM

## 2024-05-27 LAB — GENECONNECT MOLECULAR SCREEN: Genetic Analysis Overall Interpretation: NEGATIVE

## 2024-05-27 LAB — PHOSPHORUS: Phosphorus: 4.7 mg/dL — ABNORMAL HIGH (ref 2.3–4.6)

## 2024-05-27 LAB — TRANSFERRIN: Transferrin: 245 mg/dL (ref 212.0–360.0)

## 2024-05-30 ENCOUNTER — Ambulatory Visit
Admission: RE | Admit: 2024-05-30 | Discharge: 2024-05-30 | Disposition: A | Discharge status: Home / Self Care | Source: Ambulatory Visit | Attending: Nurse Practitioner | Admitting: Nurse Practitioner

## 2024-05-30 DIAGNOSIS — K760 Fatty (change of) liver, not elsewhere classified: Secondary | ICD-10-CM | POA: Diagnosis not present

## 2024-05-30 DIAGNOSIS — R7989 Other specified abnormal findings of blood chemistry: Secondary | ICD-10-CM

## 2024-05-31 ENCOUNTER — Ambulatory Visit: Admitting: Physical Therapy

## 2024-05-31 ENCOUNTER — Encounter: Payer: Self-pay | Admitting: Nurse Practitioner

## 2024-06-01 ENCOUNTER — Ambulatory Visit: Admitting: Internal Medicine

## 2024-06-01 ENCOUNTER — Encounter: Payer: Self-pay | Admitting: Internal Medicine

## 2024-06-01 VITALS — BP 120/82 | HR 77 | Temp 98.0°F | Ht 66.0 in | Wt 268.2 lb

## 2024-06-01 DIAGNOSIS — L739 Follicular disorder, unspecified: Secondary | ICD-10-CM

## 2024-06-01 MED ORDER — CLINDAMYCIN PHOSPHATE 1 % EX LOTN: TOPICAL_LOTION | Freq: Two times a day (BID) | CUTANEOUS | 0 refills | Status: DC

## 2024-06-01 NOTE — Patient Instructions (Addendum)
 Over the counter Benzoyl Peroxide body wash (Cetaphil has this) once daily.  Benadryl  at nighttime as needed for itching  Can use Hydrocortisone cream 1% - Apply as needed for itching

## 2024-06-01 NOTE — Telephone Encounter (Signed)
 Patient has been scheduled with [facility] Neurosurgery office 06/14/24

## 2024-06-01 NOTE — Progress Notes (Signed)
 [facility] [address] [place] [address] Dept: [phone] Dept Fax: [phone]  Acute Care Office Visit  Subjective:   Kim Watson [birth year] 06/01/2024  Chief Complaint  Patient presents with   Rash    After getting injection showed up on Monday     HPI:  Kim Watson is a 53 year old female who presents complaining of a rash that started on Monday.  Rash is located to the left buttock.  Associated itching.  Patient reports she received an epidural injection earlier last month, is unsure if it is related to this or not.    The following portions of the patient's history were reviewed and updated as appropriate: past medical history, past surgical history, family history, social history, allergies, medications, and problem list.   Patient Active Problem List   Diagnosis Date Noted   Routine general medical examination at a health care facility 05/10/2024   Cervical myofascial pain syndrome 03/29/2024   Myofascial pain syndrome of lumbar spine 03/29/2024   Post-concussion headache 03/29/2024   Concussion with no loss of consciousness 02/04/2024   Vaginal dryness 01/05/2024   Menopausal and female climacteric states 01/05/2024   Major depressive disorder, recurrent, mild (HCC) 01/05/2024   Low libido 01/05/2024   Other fatigue 01/05/2024   Chronic fatigue 01/05/2024   Arthralgia 01/05/2024   Anxiety 01/05/2024   S/P laparoscopic sleeve gastrectomy 10/20/2023   Neuropathic pain 10/12/2023   Pre-procedure lab exam 10/02/2023   Lumbar radiculopathy 09/22/2023   Hot flashes 09/10/2023   Primary insomnia 09/10/2023   Bronchitis 05/15/2023   Lumbar pain 04/20/2023   Back pain 04/14/2023   Symptomatic mammary hypertrophy 04/14/2023   Bilateral carpal tunnel syndrome 12/12/2022   Tendonitis 12/12/2022   Cervical myelopathy (HCC) 10/02/2022   Preop examination 09/16/2022   COVID-19 08/20/2022   Acute  nonintractable headache 08/20/2022   Pain in right shoulder 06/27/2022   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/07/2022   Primary hypertension 05/07/2022   Chronic neck pain 05/07/2022   Acute left-sided low back pain without sciatica 02/24/2022   Bursitis of left shoulder 01/10/2022   Dry mouth 01/10/2022   History of bariatric surgery 01/10/2022   Elevated blood pressure reading 01/10/2022   Anxiety and depression 01/10/2022   Gastroesophageal reflux disease 01/10/2022   Pain in throat 07/18/2020   Spondylolisthesis at L4-L5 level 01/27/2020   Chronic migraine without aura without status migrainosus, not intractable 08/31/2018   Recurrent oral herpes simplex 08/31/2018   Environmental and seasonal allergies 08/31/2018   Herpes simplex 05/21/2018   Glenohumeral arthritis, left 05/13/2018   Iron deficiency 03/26/2018   History of colonic polyps 12/23/2017   Prediabetes 07/13/2017   At risk for diabetes mellitus 07/02/2017   Insulin  resistance 07/02/2017   Vitamin D  deficiency 07/02/2017   Other hyperlipidemia 07/02/2017   Metabolic syndrome 07/02/2017   Morbid obesity (HCC) 06/18/2017   Moderate recurrent major depression (HCC) 01/02/2017   History of sleeve gastrectomy 02/27/2016   Chronic pain of both shoulders 11/02/2014   Arthritis of both knees 10/04/2014   OSA (obstructive sleep apnea) 01/30/2014   Past Medical History:  Diagnosis Date   Anxiety    Arthritis    Depression    Gallbladder problem    GERD (gastroesophageal reflux disease)    High blood pressure    Hypertension    Migraine    Obesity    Obstructive sleep apnea    2016 was cleared from having to use CPAP at night.  Gastric bypass surgery 2015.   Vitamin D  deficiency    Past Surgical History:  Procedure Laterality Date   ANTERIOR CERVICAL DECOMP/DISCECTOMY FUSION N/A 10/02/2022   Procedure: ANTERIOR CERVICAL DISCECTOMY AND FUSION CERVICAL FOUR TO SEVEN;  Surgeon: Burnetta Aures, MD;  Location: [facility] OR;  Service:  Orthopedics;  Laterality: N/A;  4hrs 3 C-Bed   CARPAL TUNNEL RELEASE Left 12/2023   CHOLECYSTECTOMY     FOOT SURGERY Left 07/26/2022   5th metatarsal   SLEEVE GASTROPLASTY     TUBAL LIGATION     Family History  Problem Relation Age of Onset   Diabetes Maternal Grandmother    Heart disease Maternal Grandmother    Hypertension Maternal Great-grandmother    Hyperlipidemia Maternal Great-grandmother    Diabetes Niece    Diabetes Niece    BRCA 1/2 Neg Hx    Breast cancer Neg Hx    Migraines Neg Hx     Current Outpatient Medications:    Acetaminophen  (TYLENOL  ARTHRITIS PAIN PO), Take by mouth as needed., Disp: , Rfl:    clindamycin  (CLEOCIN  T) 1 % lotion, Apply topically 2 (two) times daily., Disp: 60 mL, Rfl: 0   clonazePAM  (KLONOPIN ) 0.5 MG disintegrating tablet, DISSOLVE 1 TABLET(0.5 MG) ON THE TONGUE TWICE DAILY (Patient taking differently: As needed), Disp: 30 tablet, Rfl: 1   cloNIDine  (CATAPRES  - DOSED IN MG/24 HR) 0.1 mg/24hr patch, Place 1 patch (0.1 mg total) onto the skin once a week., Disp: 12 patch, Rfl: 1   clotrimazole -betamethasone  (LOTRISONE ) cream, Apply topically 2 (two) times daily., Disp: 45 g, Rfl: 0   cyclobenzaprine  (FLEXERIL ) 10 MG tablet, Take 10 mg by mouth at bedtime. prn, Disp: , Rfl:    DULoxetine  (CYMBALTA ) 60 MG capsule, Take 1 capsule (60 mg total) by mouth daily., Disp: 90 capsule, Rfl: 1   fluticasone  (FLONASE ) 50 MCG/ACT nasal spray, Place 2 sprays into both nostrils daily., Disp: 16 g, Rfl: 6   gabapentin  (NEURONTIN ) 300 MG capsule, TAKE 1 CAPSULE(300 MG) BY MOUTH THREE TIMES DAILY, Disp: 270 capsule, Rfl: 1   ondansetron  (ZOFRAN ) 4 MG tablet, Take 1 tablet (4 mg total) by mouth every 8 (eight) hours as needed for nausea or vomiting., Disp: 20 tablet, Rfl: 0   pantoprazole  (PROTONIX ) 40 MG tablet, TAKE 1 TABLET(40 MG) BY MOUTH DAILY, Disp: 90 tablet, Rfl: 1   Semaglutide -Weight Management 0.25 MG/0.5ML SOAJ, Inject 0.25 mg into the skin., Disp: , Rfl:     valACYclovir  (VALTREX ) 500 MG tablet, TAKE 1 TABLET(500 MG) BY MOUTH TWICE DAILY FOR 3 DAYS AS NEEDED FOR FLARE UP, Disp: 30 tablet, Rfl: 1   acetaminophen -codeine  (TYLENOL  #3) 300-30 MG tablet, Take 1 tablet by mouth every 6 (six) hours as needed for moderate pain (pain score 4-6). (Patient not taking: Reported on 06/01/2024), Disp: 20 tablet, Rfl: 0   lidocaine  (LIDODERM ) 5 %, Place 1 patch onto the skin daily. Remove & Discard patch within 12 hours or as directed by MD, Disp: 30 patch, Rfl: 0   losartan -hydrochlorothiazide  (HYZAAR) 50-12.5 MG tablet, TAKE 1 TABLET BY MOUTH DAILY (Patient not taking: Reported on 06/01/2024), Disp: 90 tablet, Rfl: 0 Allergies  Allergen Reactions   Nsaids Other (See Comments)    History of gastric bypass   Zestril [Lisinopril] Cough          ROS: A complete ROS was performed with pertinent positives/negatives noted in the HPI. The remainder of the ROS are negative.    Objective:   Today's Vitals   06/01/24  1550  BP: 120/82  Pulse: 77  Temp: 98 F (36.7 C)  TempSrc: Temporal  SpO2: 99%  Weight: 268 lb 3.2 oz (121.7 kg)  Height: 5' 6 (1.676 m)    GENERAL: Well-appearing, in NAD. Well nourished.  SKIN: Pink, warm and dry.  Localized area of erythematous papules and pustules to left buttock. RESPIRATORY:  Respirations even and non-labored.  NEUROLOGIC: Steady, even gait.  PSYCH/MENTAL STATUS: Alert, oriented x 3. Cooperative, appropriate mood and affect.    No results found for any visits on 06/01/24.    Assessment & Plan:  1. Folliculitis (Primary) - clindamycin  (CLEOCIN  T) 1 % lotion; Apply topically 2 (two) times daily.  Dispense: 60 mL; Refill: 0 Over the counter Benzoyl Peroxide body wash (Cetaphil has this) once daily.  Benadryl  at nighttime as needed for itching  Can use Hydrocortisone cream 1% - Apply as needed for itching   Meds ordered this encounter  Medications   clindamycin  (CLEOCIN  T) 1 % lotion    Sig: Apply topically 2  (two) times daily.    Dispense:  60 mL    Refill:  0    Supervising Provider:   SEBASTIAN BEVERLEY NOVAK [[id number]]   No orders of the defined types were placed in this encounter.  Lab Orders  No laboratory test(s) ordered today   No images are attached to the encounter or orders placed in the encounter.  Return if symptoms worsen or fail to improve.   Rosina Senters, FNP

## 2024-06-02 ENCOUNTER — Ambulatory Visit: Admitting: Nurse Practitioner

## 2024-06-02 ENCOUNTER — Ambulatory Visit: Admitting: Physical Therapy

## 2024-06-09 ENCOUNTER — Ambulatory Visit: Admitting: Nurse Practitioner

## 2024-06-09 NOTE — Progress Notes (Signed)
 Referring Physician:  Nedra Tinnie LABOR, NP [address] [place],  [place] [address]  Primary Physician:  Nedra Tinnie LABOR, NP  History of Present Illness: 06/14/2024 [name] is here today with a chief complaint of back and right leg pain.  She has been having pain for years.  Is been worsening over time.  She now gets pain within about 100 feet of walking.  Her pain starts in her back and right buttock and extends down the back of her leg.  She has had an injection which helped a small amount, but did not eliminate her pain.  Her pain is now impacting her day-to-day life.  She tried physical therapy without improvement.  Low back pain that radiates down right leg to the back of knee causing pain.   Bowel/Bladder Dysfunction: none  Conservative measures:  Physical therapy:  has participated in PT at [facility] Multimodal medical therapy including regular antiinflammatories:  Cymbalta , Tylenol  #3, Flexeril , Gabapentin , Lidocaine  patches Injections: 05/26/2022 L4 ESI  Past Surgery: 10/02/2022 C4-7 ACDF Surgeon: Burnetta Aures, MD;   [name] Finder has no symptoms of cervical myelopathy.  The symptoms are causing a significant impact on the patient's life.   I have utilized the care everywhere function in epic to review the outside records available from external health systems.  Review of Systems:  A 10 point review of systems is negative, except for the pertinent positives and negatives detailed in the HPI.  Past Medical History: Past Medical History:  Diagnosis Date   Anxiety    Arthritis    Depression    Gallbladder problem    GERD (gastroesophageal reflux disease)    High blood pressure    Hypertension    Migraine    Obesity    Obstructive sleep apnea    2016 was cleared from having to use CPAP at night. Gastric bypass surgery 2015.   Vitamin D  deficiency     Past Surgical History: Past Surgical History:  Procedure Laterality Date   ANTERIOR  CERVICAL DECOMP/DISCECTOMY FUSION N/A 10/02/2022   Procedure: ANTERIOR CERVICAL DISCECTOMY AND FUSION CERVICAL FOUR TO SEVEN;  Surgeon: Burnetta Aures, MD;  Location: [facility] OR;  Service: Orthopedics;  Laterality: N/A;  4hrs 3 C-Bed   CARPAL TUNNEL RELEASE Left 12/2023   CHOLECYSTECTOMY     FOOT SURGERY Left 07/26/2022   5th metatarsal   SLEEVE GASTROPLASTY     TUBAL LIGATION      Allergies: Allergies as of 06/14/2024 - Review Complete 06/14/2024  Allergen Reaction Noted   Nsaids Other (See Comments) 04/17/2021   Zestril [lisinopril] Cough 09/05/2020    Medications:  Current Outpatient Medications:    Acetaminophen  (TYLENOL  ARTHRITIS PAIN PO), Take by mouth as needed., Disp: , Rfl:    clindamycin  (CLEOCIN  T) 1 % lotion, Apply topically 2 (two) times daily., Disp: 60 mL, Rfl: 0   clonazePAM  (KLONOPIN ) 0.5 MG disintegrating tablet, DISSOLVE 1 TABLET(0.5 MG) ON THE TONGUE TWICE DAILY (Patient taking differently: As needed), Disp: 30 tablet, Rfl: 1   cloNIDine  (CATAPRES  - DOSED IN MG/24 HR) 0.1 mg/24hr patch, Place 1 patch (0.1 mg total) onto the skin once a week., Disp: 12 patch, Rfl: 1   clotrimazole -betamethasone  (LOTRISONE ) cream, Apply topically 2 (two) times daily., Disp: 45 g, Rfl: 0   cyclobenzaprine  (FLEXERIL ) 10 MG tablet, Take 10 mg by mouth at bedtime. prn, Disp: , Rfl:    DULoxetine  (CYMBALTA ) 60 MG capsule, Take 1 capsule (60 mg total) by mouth daily.,  Disp: 90 capsule, Rfl: 1   fluticasone  (FLONASE ) 50 MCG/ACT nasal spray, Place 2 sprays into both nostrils daily., Disp: 16 g, Rfl: 6   pantoprazole  (PROTONIX ) 40 MG tablet, TAKE 1 TABLET(40 MG) BY MOUTH DAILY, Disp: 90 tablet, Rfl: 1   Semaglutide -Weight Management 0.25 MG/0.5ML SOAJ, Inject 0.25 mg into the skin., Disp: , Rfl:    valACYclovir  (VALTREX ) 500 MG tablet, TAKE 1 TABLET(500 MG) BY MOUTH TWICE DAILY FOR 3 DAYS AS NEEDED FOR FLARE UP, Disp: 30 tablet, Rfl: 1   acetaminophen -codeine  (TYLENOL  #3) 300-30 MG tablet, Take 1  tablet by mouth every 6 (six) hours as needed for moderate pain (pain score 4-6). (Patient not taking: Reported on 06/01/2024), Disp: 20 tablet, Rfl: 0   gabapentin  (NEURONTIN ) 300 MG capsule, TAKE 1 CAPSULE(300 MG) BY MOUTH THREE TIMES DAILY, Disp: 270 capsule, Rfl: 1   losartan -hydrochlorothiazide  (HYZAAR) 50-12.5 MG tablet, TAKE 1 TABLET BY MOUTH DAILY (Patient not taking: Reported on 06/01/2024), Disp: 90 tablet, Rfl: 0   ondansetron  (ZOFRAN ) 4 MG tablet, Take 1 tablet (4 mg total) by mouth every 8 (eight) hours as needed for nausea or vomiting., Disp: 20 tablet, Rfl: 0  Social History: Social History   Tobacco Use   Smoking status: Never    Passive exposure: Never   Smokeless tobacco: Never  Vaping Use   Vaping status: Never Used  Substance Use Topics   Alcohol use: Never    Comment: socially in the past   Drug use: No    Family Medical History: Family History  Problem Relation Age of Onset   Diabetes Maternal Grandmother    Heart disease Maternal Grandmother    Hypertension Maternal Great-grandmother    Hyperlipidemia Maternal Great-grandmother    Diabetes Niece    Diabetes Niece    BRCA 1/2 Neg Hx    Breast cancer Neg Hx    Migraines Neg Hx     Physical Examination: Vitals:   06/14/24 0858  BP: 134/86    General: Patient is in no apparent distress. Attention to examination is appropriate.  Neck:   Supple.  Full range of motion.  Respiratory: Patient is breathing without any difficulty.   NEUROLOGICAL:     Awake, alert, oriented to person, place, and time.  Speech is clear and fluent.   Cranial Nerves: Pupils equal round and reactive to light.  Facial tone is symmetric.  Facial sensation is symmetric. Shoulder shrug is symmetric. Tongue protrusion is midline.  There is no pronator drift.  Strength: Side Biceps Triceps Deltoid Interossei Grip Wrist Ext. Wrist Flex.  R 5 5 5 5 5 5 5   L 5 5 5 5 5 5 5    Side Iliopsoas Quads Hamstring PF DF EHL  R 5 5 5 5 5 5    L 5 5 5 5 5 5    Reflexes are 1+ and symmetric at the biceps, triceps, brachioradialis, patella and achilles.   Hoffman's is absent.   Bilateral upper and lower extremity sensation is intact to light touch.    No evidence of dysmetria noted.  Gait is normal.     Medical Decision Making  Imaging: MRI L spine 04/29/2024 IMPRESSION: At L4-L5, anterolisthesis with uncovered disc and superimposed central disc extrusion, slightly decreased from prior. Worsening bilateral facet arthropathy contributes to compression of the traversing left L5 nerve root in the subarticular zone and moderate central spinal canal stenosis, increased from prior. Unchanged moderate right neural foraminal narrowing at this level.     Electronically Signed  By: Ryan Chess M.D.   On: 04/29/2024 09:41    She has approximately 7 mm anterolisthesis of L4 and L5.   I have personally reviewed the images and agree with the above interpretation.  Assessment and Plan: [name] is a pleasant 53 y.o. female with 7 and half millimeter anterolisthesis of L4 and L5 with severe compression of the right sided L5 nerve root.  She has prior CT scan of the abdomen from several years ago which has only a 2 to 3 mm anterolisthesis.  As such, she has developed lumbar spinal instability with worsening spondylolisthesis of L4 and L5.  She has back pain with right-sided leg pain.  She has tried and failed conservative management.  There is no further role for conservative management.  At this point, I have recommended surgical intervention.  Due to the instability and anterolisthesis, she will require surgical fixation.  I recommended L4-5 lateral lumbar interbody fusion with posterior fixation and fusion.  She is an excellent candidate for this.  She has lost 25 pounds over the past several months.  I have encouraged her to continue her weight loss journey.  I discussed the planned procedure at length with the patient,  including the risks, benefits, alternatives, and indications. The risks discussed include but are not limited to bleeding, infection, need for reoperation, spinal fluid leak, stroke, vision loss, anesthetic complication, coma, paralysis, and even death. I also described the possibility of psoas weakness and paresthesias. I described in detail that improvement was not guaranteed.  The patient expressed understanding of these risks, and asked that we proceed with surgery. I described the surgery in layman's terms, and gave ample opportunity for questions, which were answered to the best of my ability.     Thank you for involving me in the care of this patient.      Isabel Ardila K. Clois MD, [facility]

## 2024-06-09 NOTE — H&P (View-Only) (Signed)
 Referring Physician:  Nedra Tinnie LABOR, NP [address] [place],  [place] [address]  Primary Physician:  Kim Tinnie LABOR, NP  History of Present Illness: 06/14/2024 [name] is here today with a chief complaint of back and right leg pain.  She has been having pain for years.  Is been worsening over time.  She now gets pain within about 100 feet of walking.  Her pain starts in her back and right buttock and extends down the back of her leg.  She has had an injection which helped a small amount, but did not eliminate her pain.  Her pain is now impacting her day-to-day life.  She tried physical therapy without improvement.  Low back pain that radiates down right leg to the back of knee causing pain.   Bowel/Bladder Dysfunction: none  Conservative measures:  Physical therapy:  has participated in PT at [facility] Multimodal medical therapy including regular antiinflammatories:  Cymbalta , Tylenol  #3, Flexeril , Gabapentin , Lidocaine  patches Injections: 05/26/2022 L4 ESI  Past Surgery: 10/02/2022 C4-7 ACDF Surgeon: Kim Aures, MD;   Kim Watson has no symptoms of cervical myelopathy.  The symptoms are causing a significant impact on the patient's life.   I have utilized the care everywhere function in epic to review the outside records available from external health systems.  Review of Systems:  A 10 point review of systems is negative, except for the pertinent positives and negatives detailed in the HPI.  Past Medical History: Past Medical History:  Diagnosis Date   Anxiety    Arthritis    Depression    Gallbladder problem    GERD (gastroesophageal reflux disease)    High blood pressure    Hypertension    Migraine    Obesity    Obstructive sleep apnea    2016 was cleared from having to use CPAP at night. Gastric bypass surgery 2015.   Vitamin D  deficiency     Past Surgical History: Past Surgical History:  Procedure Laterality Date   ANTERIOR  CERVICAL DECOMP/DISCECTOMY FUSION N/A 10/02/2022   Procedure: ANTERIOR CERVICAL DISCECTOMY AND FUSION CERVICAL FOUR TO SEVEN;  Surgeon: Kim Aures, MD;  Location: [facility] OR;  Service: Orthopedics;  Laterality: N/A;  4hrs 3 C-Bed   CARPAL TUNNEL RELEASE Left 12/2023   CHOLECYSTECTOMY     FOOT SURGERY Left 07/26/2022   5th metatarsal   SLEEVE GASTROPLASTY     TUBAL LIGATION      Allergies: Allergies as of 06/14/2024 - Review Complete 06/14/2024  Allergen Reaction Noted   Nsaids Other (See Comments) 04/17/2021   Zestril [lisinopril] Cough 09/05/2020    Medications:  Current Outpatient Medications:    Acetaminophen  (TYLENOL  ARTHRITIS PAIN PO), Take by mouth as needed., Disp: , Rfl:    clindamycin  (CLEOCIN  T) 1 % lotion, Apply topically 2 (two) times daily., Disp: 60 mL, Rfl: 0   clonazePAM  (KLONOPIN ) 0.5 MG disintegrating tablet, DISSOLVE 1 TABLET(0.5 MG) ON THE TONGUE TWICE DAILY (Patient taking differently: As needed), Disp: 30 tablet, Rfl: 1   cloNIDine  (CATAPRES  - DOSED IN MG/24 HR) 0.1 mg/24hr patch, Place 1 patch (0.1 mg total) onto the skin once a week., Disp: 12 patch, Rfl: 1   clotrimazole -betamethasone  (LOTRISONE ) cream, Apply topically 2 (two) times daily., Disp: 45 g, Rfl: 0   cyclobenzaprine  (FLEXERIL ) 10 MG tablet, Take 10 mg by mouth at bedtime. prn, Disp: , Rfl:    DULoxetine  (CYMBALTA ) 60 MG capsule, Take 1 capsule (60 mg total) by mouth daily.,  Disp: 90 capsule, Rfl: 1   fluticasone  (FLONASE ) 50 MCG/ACT nasal spray, Place 2 sprays into both nostrils daily., Disp: 16 g, Rfl: 6   pantoprazole  (PROTONIX ) 40 MG tablet, TAKE 1 TABLET(40 MG) BY MOUTH DAILY, Disp: 90 tablet, Rfl: 1   Semaglutide -Weight Management 0.25 MG/0.5ML SOAJ, Inject 0.25 mg into the skin., Disp: , Rfl:    valACYclovir  (VALTREX ) 500 MG tablet, TAKE 1 TABLET(500 MG) BY MOUTH TWICE DAILY FOR 3 DAYS AS NEEDED FOR FLARE UP, Disp: 30 tablet, Rfl: 1   acetaminophen -codeine  (TYLENOL  #3) 300-30 MG tablet, Take 1  tablet by mouth every 6 (six) hours as needed for moderate pain (pain score 4-6). (Patient not taking: Reported on 06/01/2024), Disp: 20 tablet, Rfl: 0   gabapentin  (NEURONTIN ) 300 MG capsule, TAKE 1 CAPSULE(300 MG) BY MOUTH THREE TIMES DAILY, Disp: 270 capsule, Rfl: 1   losartan -hydrochlorothiazide  (HYZAAR) 50-12.5 MG tablet, TAKE 1 TABLET BY MOUTH DAILY (Patient not taking: Reported on 06/01/2024), Disp: 90 tablet, Rfl: 0   ondansetron  (ZOFRAN ) 4 MG tablet, Take 1 tablet (4 mg total) by mouth every 8 (eight) hours as needed for nausea or vomiting., Disp: 20 tablet, Rfl: 0  Social History: Social History   Tobacco Use   Smoking status: Never    Passive exposure: Never   Smokeless tobacco: Never  Vaping Use   Vaping status: Never Used  Substance Use Topics   Alcohol use: Never    Comment: socially in the past   Drug use: No    Family Medical History: Family History  Problem Relation Age of Onset   Diabetes Maternal Grandmother    Heart disease Maternal Grandmother    Hypertension Maternal Great-grandmother    Hyperlipidemia Maternal Great-grandmother    Diabetes Niece    Diabetes Niece    BRCA 1/2 Neg Hx    Breast cancer Neg Hx    Migraines Neg Hx     Physical Examination: Vitals:   06/14/24 0858  BP: 134/86    General: Patient is in no apparent distress. Attention to examination is appropriate.  Neck:   Supple.  Full range of motion.  Respiratory: Patient is breathing without any difficulty.   NEUROLOGICAL:     Awake, alert, oriented to person, place, and time.  Speech is clear and fluent.   Cranial Nerves: Pupils equal round and reactive to light.  Facial tone is symmetric.  Facial sensation is symmetric. Shoulder shrug is symmetric. Tongue protrusion is midline.  There is no pronator drift.  Strength: Side Biceps Triceps Deltoid Interossei Grip Wrist Ext. Wrist Flex.  R 5 5 5 5 5 5 5   L 5 5 5 5 5 5 5    Side Iliopsoas Quads Hamstring PF DF EHL  R 5 5 5 5 5 5    L 5 5 5 5 5 5    Reflexes are 1+ and symmetric at the biceps, triceps, brachioradialis, patella and achilles.   Hoffman's is absent.   Bilateral upper and lower extremity sensation is intact to light touch.    No evidence of dysmetria noted.  Gait is normal.     Medical Decision Making  Imaging: MRI L spine 04/29/2024 IMPRESSION: At L4-L5, anterolisthesis with uncovered disc and superimposed central disc extrusion, slightly decreased from prior. Worsening bilateral facet arthropathy contributes to compression of the traversing left L5 nerve root in the subarticular zone and moderate central spinal canal stenosis, increased from prior. Unchanged moderate right neural foraminal narrowing at this level.     Electronically Signed  By: Ryan Chess M.D.   On: 04/29/2024 09:41    She has approximately 7 mm anterolisthesis of L4 and L5.   I have personally reviewed the images and agree with the above interpretation.  Assessment and Plan: Ms. Kettering is a pleasant 53 y.o. female with 7 and half millimeter anterolisthesis of L4 and L5 with severe compression of the right sided L5 nerve root.  She has prior CT scan of the abdomen from several years ago which has only a 2 to 3 mm anterolisthesis.  As such, she has developed lumbar spinal instability with worsening spondylolisthesis of L4 and L5.  She has back pain with right-sided leg pain.  She has tried and failed conservative management.  There is no further role for conservative management.  At this point, I have recommended surgical intervention.  Due to the instability and anterolisthesis, she will require surgical fixation.  I recommended L4-5 lateral lumbar interbody fusion with posterior fixation and fusion.  She is an excellent candidate for this.  She has lost 25 pounds over the past several months.  I have encouraged her to continue her weight loss journey.  I discussed the planned procedure at length with the patient,  including the risks, benefits, alternatives, and indications. The risks discussed include but are not limited to bleeding, infection, need for reoperation, spinal fluid leak, stroke, vision loss, anesthetic complication, coma, paralysis, and even death. I also described the possibility of psoas weakness and paresthesias. I described in detail that improvement was not guaranteed.  The patient expressed understanding of these risks, and asked that we proceed with surgery. I described the surgery in layman's terms, and gave ample opportunity for questions, which were answered to the best of my ability.     Thank you for involving me in the care of this patient.      Masai Kidd K. Clois MD, [facility] Neurosurgery

## 2024-06-14 ENCOUNTER — Other Ambulatory Visit: Payer: Self-pay

## 2024-06-14 ENCOUNTER — Ambulatory Visit: Admitting: Neurosurgery

## 2024-06-14 ENCOUNTER — Encounter: Payer: Self-pay | Admitting: Neurosurgery

## 2024-06-14 VITALS — BP 134/86 | Ht 66.0 in | Wt 267.0 lb

## 2024-06-14 DIAGNOSIS — M532X6 Spinal instabilities, lumbar region: Secondary | ICD-10-CM | POA: Diagnosis not present

## 2024-06-14 DIAGNOSIS — M5441 Lumbago with sciatica, right side: Secondary | ICD-10-CM

## 2024-06-14 DIAGNOSIS — Z01818 Encounter for other preprocedural examination: Secondary | ICD-10-CM

## 2024-06-14 DIAGNOSIS — M4316 Spondylolisthesis, lumbar region: Secondary | ICD-10-CM

## 2024-06-14 DIAGNOSIS — G8929 Other chronic pain: Secondary | ICD-10-CM | POA: Diagnosis not present

## 2024-06-14 NOTE — Addendum Note (Signed)
 Addended by: Haroon Shatto on: 06/14/2024 09:53 AM   Modules accepted: Orders

## 2024-06-14 NOTE — Patient Instructions (Signed)
 Please see below for information in regards to your upcoming surgery:   Planned surgery: L4-5 lateral lumbar interbody fusion and posterior spinal fusion   Surgery date: 07/13/24 at [facility] (Medical Mall: [address] [place], [place] [address]) - you will find out your arrival time the business day before your surgery.   Pre-op appointment at [facility] Pre-admit Testing: you will receive a call with a date/time for this appointment. If you are scheduled for an in person appointment, Pre-admit Testing is located on the first floor of the [facility] building, [address], [address]. During this appointment, they will advise you which medications you can take the morning of surgery, and which medications you will need to hold for surgery. Labs (such as blood work, EKG) may be done at your pre-op appointment. You are not required to fast for these labs. Should you need to change your pre-op appointment, please call Pre-admit testing at [phone].     Diabetes/weight loss medications: Per anesthesia guidelines (due to the increased risk of aspiration caused by delayed gastric emptying):  Semaglutide  injections: hold for 7 days prior to surgery     NSAIDS (Non-steroidal anti-inflammatory drugs): because you are having a fusion, please avoid taking any NSAIDS (examples: ibuprofen , motrin , aleve , naproxen , meloxicam, diclofenac) for 3 months after surgery. Celebrex is an exception and is OK to take, if prescribed. Tylenol  is not an NSAID.    Common restrictions after surgery: No bending, lifting, or twisting ("BLT"). Avoid lifting objects heavier than 10 pounds for the first 6 weeks after surgery. Where possible, avoid household activities that involve lifting, bending, reaching, pushing, or pulling such as laundry, vacuuming, grocery shopping, and childcare. Try to arrange for help from friends and family for these activities while you heal.  Do not drive while taking prescription pain medication. Weeks 6 through 12 after surgery: avoid lifting more than 25 pounds.    X-rays after surgery: Because you are having a fusion: for appointments after your 2 week follow-up: please arrive at the [facility] outpatient imaging center ([address], [address], [place]) or [facility] one hour prior to your appointment for x-rays. This applies to every appointment after your 2 week follow-up. Failure to do so may result in your appointment being rescheduled.   How to contact us :  If you have any questions/concerns before or after surgery, you can reach us  at [phone], or you can send a mychart message. We can be reached by phone or mychart 8am-4pm, Monday-Friday.  *Please note: Calls after 4pm are forwarded to a third party answering service. Mychart messages are not routinely monitored during evenings, weekends, and holidays. Please call our office to contact the answering service for urgent concerns during non-business hours.    If you have FMLA/disability paperwork, please drop it off or fax it to [phone]   Appointments/FMLA & disability paperwork: [employer] Registered Nurse/Surgery scheduler: Daizee Firmin, RN Certified Medical Assistants: Don, CMA, Elenor, CMA, & Damien, CMA Physician Assistants: Lyle Decamp, PA-C, Edsel Goods, PA-C & Glade Boys, PA-C Surgeons: Penne Sharps, MD & Reeves Daisy, MD   [facility] PREADMIT TESTING VISIT and SURGERY INFORMATION SHEET   Now that surgery has been scheduled you can anticipate several phone calls from [facility] services. A pharmacy technician will call you to verify your current list of medications taken at home.               The  Pre-Service Center will call to verify your insurance information and to give you billing estimates and information.             The Preadmit Testing Office will be calling to schedule a  visit to obtain information for the anesthesia team and provide instructions on preparation for surgery.  What can you expect for the Preadmit Testing Visit: Appointments may be scheduled in-person or by telephone.  If a telephone visit is scheduled, you may be asked to come into the office to have lab tests or other studies performed.   This visit will not be completed any greater than 14 days prior to your surgery.  If your surgery has been scheduled for a future date, please do not be alarmed if we have not contacted you to schedule an appointment more than a month prior to the surgery date.    Please be prepared to provide the following information during this appointment:            -Personal medical history                                               -Medication and allergy list            -Any history of problems with anesthesia              -Recent lab work or diagnostic studies            -Please notify us  of any needs we should be aware of to provide the best care possible           -You will be provided with instructions on how to prepare for your surgery.    On The Day of Surgery:  You must have a driver to take you home after surgery, you will be asked not to drive for 24 hours following surgery.  Taxi, Gisele and non-medical transport will not be acceptable means of transportation unless you have a responsible individual who will be traveling with you.  Visitors in the surgical area:   2 people will be able to visit you in your room once your preparation for surgery has been completed. During surgery, your visitors will be asked to wait in the Surgery Waiting Area.  It is not a requirement for them to stay, if they prefer to leave and come back.  Your visitor(s) will be given an update once the surgery has been completed.  No visitors are allowed in the initial recovery room to respect patient privacy and safety.  Once you are more awake and transfer to the secondary recovery  area, or are transferred to an inpatient room, visitors will again be able to see you.  To respect and protect your privacy: We will ask on the day of surgery who your driver will be and what the contact number for that individual will be. We will ask if it is okay to share information with this individual, or if there is an alternative individual that we, or the surgeon, should contact to provide updates and information. If family or friends come to the surgical information desk requesting information about you, who you have not listed with us , no information will be given.   It may be helpful to designate someone as the main contact who will be responsible for updating your other friends and  family.    PREADMIT TESTING OFFICE: [phone] SAME DAY SURGERY: [phone] We look forward to caring for you before and throughout the process of your surgery.

## 2024-06-15 ENCOUNTER — Other Ambulatory Visit: Payer: Self-pay | Admitting: Nurse Practitioner

## 2024-06-15 ENCOUNTER — Encounter: Payer: Self-pay | Admitting: Nurse Practitioner

## 2024-06-15 DIAGNOSIS — Z87898 Personal history of other specified conditions: Secondary | ICD-10-CM

## 2024-06-15 MED ORDER — SCOPOLAMINE 1 MG/3DAYS TD PT72: 1.0000 | MEDICATED_PATCH | TRANSDERMAL | 0 refills | Status: DC

## 2024-06-15 NOTE — Addendum Note (Signed)
 Addended by: KATHEEN GARDEN L on: 06/15/2024 03:26 PM   Modules accepted: Orders

## 2024-06-15 NOTE — Telephone Encounter (Signed)
 Requesting: NYSTOP  TOP PWDR 100,000 60GM  Last Visit: 05/10/2024 Next Visit: 11/08/2024 Last Refill: The original prescription was discontinued on 03/29/2024 by Ines Onetha NOVAK, MD. Renewing this prescription may not be appropriate.   Please Advise

## 2024-06-23 ENCOUNTER — Ambulatory Visit: Admitting: Nurse Practitioner

## 2024-06-26 ENCOUNTER — Telehealth: Payer: Self-pay | Admitting: Orthopedic Surgery

## 2024-06-26 NOTE — Telephone Encounter (Signed)
 Patient contacted to ensure doing well status post left open carpal tunnel release in January of this year.  She was seen initially at her 2-week postoperative visit.  She also did occupational therapy as instructed postoperatively, completed in March 2025.  She reports significant improvement in the numbness and tingling of the left hand, hypersensitivity has improved.  Has undergone prior injection for the left thumb CMC arthritis as well which is currently managing her symptoms in addition to bracing.  Will follow-up as needed moving forward.  Annsleigh Dragoo, MD Hand Surgery, [place]

## 2024-06-27 ENCOUNTER — Encounter: Payer: Self-pay | Admitting: Neurosurgery

## 2024-06-29 NOTE — Patient Instructions (Signed)
 Your procedure is scheduled on: Report to the Registration Desk on the 1st floor of the [facility]. To find out your arrival time, please call [phone] between 1PM - 3PM on: If your arrival time is 6:00 am, do not arrive before that time as the [facility] entrance doors do not open until 6:00 am.  REMEMBER: Instructions that are not followed completely may result in serious medical risk, up to and including death; or upon the discretion of your surgeon and anesthesiologist your surgery may need to be rescheduled.  Do not eat food after midnight the night before surgery.  No gum chewing or hard candies.  You may however, drink CLEAR liquids up to 2 hours before you are scheduled to arrive for your surgery. Do not drink anything within 2 hours of your scheduled arrival time.  Clear liquids include: - water  - apple juice without pulp - gatorade (not RED colors) - black coffee or tea (Do NOT add milk or creamers to the coffee or tea) Do NOT drink anything that is not on this list.  **Type 1 and Type 2 diabetics should only drink water.**  In addition, your doctor has ordered for you to drink the provided:  Ensure Pre-Surgery Clear Carbohydrate Drink  Gatorade G2 Drinking this carbohydrate drink up to two hours before surgery helps to reduce insulin  resistance and improve patient outcomes. Please complete drinking 2 hours before scheduled arrival time.  One week prior to surgery: Stop Anti-inflammatories (NSAIDS) such as Advil , Aleve , Ibuprofen , Motrin , Naproxen , Naprosyn  and Aspirin based products such as Excedrin, Goody's Powder, BC Powder. Stop ANY OVER THE COUNTER supplements until after surgery.  You may however, continue to take Tylenol  if needed for pain up until the day of surgery.  **Follow guidelines for insulin  and diabetes medications.**  **Follow recommendations regarding stopping blood thinners.**  Continue taking all of your other prescription medications up  until the day of surgery.  ON THE DAY OF SURGERY ONLY TAKE THESE MEDICATIONS WITH SIPS OF WATER:    Use inhalers on the day of surgery and bring to the hospital.  Fleets enema or bowel prep as directed.  No Alcohol for 24 hours before or after surgery.  No Smoking including e-cigarettes for 24 hours before surgery.  No chewable tobacco products for at least 6 hours before surgery.  No nicotine  patches on the day of surgery.  Do not use any recreational drugs for at least a week (preferably 2 weeks) before your surgery.  Please be advised that the combination of cocaine and anesthesia may have negative outcomes, up to and including death. If you test positive for cocaine, your surgery will be cancelled.  On the morning of surgery brush your teeth with toothpaste and water, you may rinse your mouth with mouthwash if you wish. Do not swallow any toothpaste or mouthwash.  Use CHG Soap or wipes as directed on instruction sheet.  Do not wear jewelry, make-up, hairpins, clips or nail polish.  For welded (permanent) jewelry: bracelets, anklets, waist bands, etc.  Please have this removed prior to surgery.  If it is not removed, there is a chance that hospital personnel will need to cut it off on the day of surgery.  Do not wear lotions, powders, or perfumes.   Do not shave body hair from the neck down 48 hours before surgery.  Contact lenses, hearing aids and dentures may not be worn into surgery.  Do not bring valuables to the hospital. [facility]  is not responsible for any missing/lost belongings or valuables.   Total Shoulder Arthroplasty:  use Benzoyl Peroxide 5% Gel as directed on instruction sheet.  Bring your C-PAP to the hospital in case you may have to spend the night.   Notify your doctor if there is any change in your medical condition (cold, fever, infection).  Wear comfortable clothing (specific to your surgery type) to the hospital.  After surgery, you can help  prevent lung complications by doing breathing exercises.  Take deep breaths and cough every 1-2 hours. Your doctor may order a device called an Incentive Spirometer to help you take deep breaths. When coughing or sneezing, hold a pillow firmly against your incision with both hands. This is called "splinting." Doing this helps protect your incision. It also decreases belly discomfort.  If you are being admitted to the hospital overnight, leave your suitcase in the car. After surgery it may be brought to your room.  In case of increased patient census, it may be necessary for you, the patient, to continue your postoperative care in the Same Day Surgery department.  If you are being discharged the day of surgery, you will not be allowed to drive home. You will need a responsible individual to drive you home and stay with you for 24 hours after surgery.   If you are taking public transportation, you will need to have a responsible individual with you.  Please call the Pre-admissions Testing Dept. at [phone] if you have any questions about these instructions.  Surgery Visitation Policy:  Patients having surgery or a procedure may have two visitors.  Children under the age of 46 must have an adult with them who is not the patient.  Inpatient Visitation:    Visiting hours are 7 a.m. to 8 p.m. Up to four visitors are allowed at one time in a patient room. The visitors may rotate out with other people during the day.  One visitor age 76 or older may stay with the patient overnight and must be in the room by 8 p.m.   Merchandiser, retail to address health-related social needs:  [email or web address]   Pre-operative 5 CHG Bath Instructions   You can play a key role in reducing the risk of infection after surgery. Your skin needs to be as free of germs as possible. You can reduce the number of germs on your skin by washing with CHG (chlorhexidine  gluconate) soap before  surgery. CHG is an antiseptic soap that kills germs and continues to kill germs even after washing.   DO NOT use if you have an allergy to chlorhexidine /CHG or antibacterial soaps. If your skin becomes reddened or irritated, stop using the CHG and notify one of our RNs at [phone].   Please shower with the CHG soap starting 4 days before surgery using the following schedule:     Please keep in mind the following:  DO NOT shave, including legs and underarms, starting the day of your first shower.   You may shave your face at any point before/day of surgery.  Place clean sheets on your bed the day you start using CHG soap. Use a clean washcloth (not used since being washed) for each shower. DO NOT sleep with pets once you start using the CHG.   CHG Shower Instructions:  If you choose to wash your hair and private area, wash first with your normal shampoo/soap.  After you use shampoo/soap, rinse your hair and body thoroughly to remove shampoo/soap residue.  Turn the water OFF and apply about 3 tablespoons (45 ml) of CHG soap to a CLEAN washcloth.  Apply CHG soap ONLY FROM YOUR NECK DOWN TO YOUR TOES (washing for 3-5 minutes)  DO NOT use CHG soap on face, private areas, open wounds, or sores.  Pay special attention to the area where your surgery is being performed.  If you are having back surgery, having someone wash your back for you may be helpful. Wait 2 minutes after CHG soap is applied, then you may rinse off the CHG soap.  Pat dry with a clean towel  Put on clean clothes/pajamas   If you choose to wear lotion, please use ONLY the CHG-compatible lotions on the back of this paper.     Additional instructions for the day of surgery: DO NOT APPLY any lotions, deodorants, cologne, or perfumes.   Put on clean/comfortable clothes.  Brush your teeth.  Ask your nurse before applying any prescription medications to the skin.      CHG Compatible Lotions   Aveeno Moisturizing  lotion  Cetaphil Moisturizing Cream  Cetaphil Moisturizing Lotion  Clairol Herbal Essence Moisturizing Lotion, Dry Skin  Clairol Herbal Essence Moisturizing Lotion, Extra Dry Skin  Clairol Herbal Essence Moisturizing Lotion, Normal Skin  Curel Age Defying Therapeutic Moisturizing Lotion with Alpha Hydroxy  Curel Extreme Care Body Lotion  Curel Soothing Hands Moisturizing Hand Lotion  Curel Therapeutic Moisturizing Cream, Fragrance-Free  Curel Therapeutic Moisturizing Lotion, Fragrance-Free  Curel Therapeutic Moisturizing Lotion, Original Formula  Eucerin Daily Replenishing Lotion  Eucerin Dry Skin Therapy Plus Alpha Hydroxy Crme  Eucerin Dry Skin Therapy Plus Alpha Hydroxy Lotion  Eucerin Original Crme  Eucerin Original Lotion  Eucerin Plus Crme Eucerin Plus Lotion  Eucerin TriLipid Replenishing Lotion  Keri Anti-Bacterial Hand Lotion  Keri Deep Conditioning Original Lotion Dry Skin Formula Softly Scented  Keri Deep Conditioning Original Lotion, Fragrance Free Sensitive Skin Formula  Keri Lotion Fast Absorbing Fragrance Free Sensitive Skin Formula  Keri Lotion Fast Absorbing Softly Scented Dry Skin Formula  Keri Original Lotion  Keri Skin Renewal Lotion Keri Silky Smooth Lotion  Keri Silky Smooth Sensitive Skin Lotion  Nivea Body Creamy Conditioning Oil  Nivea Body Extra Enriched Teacher, adult education Moisturizing Lotion Nivea Crme  Nivea Skin Firming Lotion  NutraDerm 30 Skin Lotion  NutraDerm Skin Lotion  NutraDerm Therapeutic Skin Cream  NutraDerm Therapeutic Skin Lotion  ProShield Protective Hand Cream  Provon moisturizing lotion

## 2024-06-30 ENCOUNTER — Other Ambulatory Visit: Payer: Self-pay

## 2024-06-30 ENCOUNTER — Encounter
Admission: RE | Admit: 2024-06-30 | Discharge: 2024-06-30 | Disposition: A | Discharge status: Home / Self Care | Source: Ambulatory Visit | Attending: Neurosurgery | Admitting: Neurosurgery

## 2024-06-30 VITALS — BP 144/89 | HR 75 | Resp 16 | Ht 66.0 in | Wt 263.9 lb

## 2024-06-30 DIAGNOSIS — Z01818 Encounter for other preprocedural examination: Secondary | ICD-10-CM | POA: Diagnosis not present

## 2024-06-30 DIAGNOSIS — I10 Essential (primary) hypertension: Secondary | ICD-10-CM | POA: Insufficient documentation

## 2024-06-30 DIAGNOSIS — R8290 Unspecified abnormal findings in urine: Secondary | ICD-10-CM | POA: Diagnosis not present

## 2024-06-30 DIAGNOSIS — R8271 Bacteriuria: Secondary | ICD-10-CM | POA: Insufficient documentation

## 2024-06-30 DIAGNOSIS — R8281 Pyuria: Secondary | ICD-10-CM | POA: Diagnosis not present

## 2024-06-30 DIAGNOSIS — M4316 Spondylolisthesis, lumbar region: Secondary | ICD-10-CM

## 2024-06-30 DIAGNOSIS — Z01810 Encounter for preprocedural cardiovascular examination: Secondary | ICD-10-CM | POA: Diagnosis present

## 2024-06-30 DIAGNOSIS — R9431 Abnormal electrocardiogram [ECG] [EKG]: Secondary | ICD-10-CM | POA: Insufficient documentation

## 2024-06-30 DIAGNOSIS — Z01812 Encounter for preprocedural laboratory examination: Secondary | ICD-10-CM | POA: Diagnosis present

## 2024-06-30 HISTORY — DX: Pneumonia, unspecified organism: J18.9

## 2024-06-30 HISTORY — DX: Spondylolisthesis, lumbar region: M43.16

## 2024-06-30 LAB — TYPE AND SCREEN
ABO/RH(D): A POS
Antibody Screen: NEGATIVE

## 2024-06-30 LAB — URINALYSIS, COMPLETE (UACMP) WITH MICROSCOPIC
Bilirubin Urine: NEGATIVE
Glucose, UA: NEGATIVE mg/dL
Hgb urine dipstick: NEGATIVE
Ketones, ur: NEGATIVE mg/dL
Nitrite: NEGATIVE
Protein, ur: NEGATIVE mg/dL
Specific Gravity, Urine: 1.023 (ref 1.005–1.030)
pH: 5 (ref 5.0–8.0)

## 2024-06-30 LAB — SURGICAL PCR SCREEN
MRSA, PCR: NEGATIVE
Staphylococcus aureus: NEGATIVE

## 2024-07-01 ENCOUNTER — Ambulatory Visit: Payer: Self-pay | Admitting: Urgent Care

## 2024-07-01 LAB — URINE CULTURE: Culture: <10000 — AB

## 2024-07-11 DIAGNOSIS — Z9884 Bariatric surgery status: Secondary | ICD-10-CM | POA: Diagnosis not present

## 2024-07-11 DIAGNOSIS — Z713 Dietary counseling and surveillance: Secondary | ICD-10-CM | POA: Diagnosis not present

## 2024-07-11 DIAGNOSIS — Z6841 Body Mass Index (BMI) 40.0 and over, adult: Secondary | ICD-10-CM | POA: Diagnosis not present

## 2024-07-11 DIAGNOSIS — K912 Postsurgical malabsorption, not elsewhere classified: Secondary | ICD-10-CM | POA: Diagnosis not present

## 2024-07-12 MED ORDER — ORAL CARE MOUTH RINSE: 15.0000 mL | Freq: Once | OROMUCOSAL | Status: AC

## 2024-07-12 MED ORDER — LACTATED RINGERS IV SOLN: INTRAVENOUS | Status: DC

## 2024-07-12 MED ORDER — VANCOMYCIN HCL IN DEXTROSE 1-5 GM/200ML-% IV SOLN
1000.0000 mg | Freq: Once | INTRAVENOUS | Status: AC
  Administered 2024-07-13 (×2): 1000 mg via INTRAVENOUS

## 2024-07-12 MED ORDER — CHLORHEXIDINE GLUCONATE 0.12 % MT SOLN
15.0000 mL | Freq: Once | OROMUCOSAL | Status: AC
  Administered 2024-07-13 (×2): 15 mL via OROMUCOSAL

## 2024-07-12 MED ORDER — CEFAZOLIN IN SODIUM CHLORIDE 2-0.9 GM/100ML-% IV SOLN
3.0000 g | Freq: Once | INTRAVENOUS | Status: DC
  Filled 2024-07-12: qty 200

## 2024-07-12 MED ORDER — CEFAZOLIN SODIUM-DEXTROSE 3-4 GM/150ML-% IV SOLN
3.0000 g | INTRAVENOUS | Status: AC
  Administered 2024-07-13 (×2): 3 g via INTRAVENOUS
  Filled 2024-07-12: qty 150

## 2024-07-13 ENCOUNTER — Inpatient Hospital Stay
Admission: RE | Admit: 2024-07-13 | Discharge: 2024-07-14 | DRG: 402 | Disposition: A | Discharge status: Home / Self Care | Attending: Neurosurgery | Admitting: Neurosurgery

## 2024-07-13 ENCOUNTER — Other Ambulatory Visit: Payer: Self-pay

## 2024-07-13 ENCOUNTER — Inpatient Hospital Stay

## 2024-07-13 ENCOUNTER — Encounter
Admission: RE | Disposition: A | Discharge status: Home / Self Care | Payer: Self-pay | Source: Home / Self Care | Attending: Neurosurgery

## 2024-07-13 ENCOUNTER — Inpatient Hospital Stay: Payer: Self-pay | Admitting: Urgent Care

## 2024-07-13 ENCOUNTER — Encounter: Payer: Self-pay | Admitting: Neurosurgery

## 2024-07-13 DIAGNOSIS — M4316 Spondylolisthesis, lumbar region: Secondary | ICD-10-CM | POA: Diagnosis not present

## 2024-07-13 DIAGNOSIS — K219 Gastro-esophageal reflux disease without esophagitis: Secondary | ICD-10-CM | POA: Diagnosis present

## 2024-07-13 DIAGNOSIS — Z8249 Family history of ischemic heart disease and other diseases of the circulatory system: Secondary | ICD-10-CM | POA: Diagnosis not present

## 2024-07-13 DIAGNOSIS — M5441 Lumbago with sciatica, right side: Secondary | ICD-10-CM | POA: Diagnosis not present

## 2024-07-13 DIAGNOSIS — G4733 Obstructive sleep apnea (adult) (pediatric): Secondary | ICD-10-CM | POA: Diagnosis not present

## 2024-07-13 DIAGNOSIS — G8929 Other chronic pain: Secondary | ICD-10-CM | POA: Diagnosis not present

## 2024-07-13 DIAGNOSIS — F32A Depression, unspecified: Secondary | ICD-10-CM | POA: Diagnosis present

## 2024-07-13 DIAGNOSIS — M532X6 Spinal instabilities, lumbar region: Secondary | ICD-10-CM | POA: Diagnosis present

## 2024-07-13 DIAGNOSIS — E669 Obesity, unspecified: Secondary | ICD-10-CM | POA: Diagnosis present

## 2024-07-13 DIAGNOSIS — Z981 Arthrodesis status: Principal | ICD-10-CM

## 2024-07-13 DIAGNOSIS — Z888 Allergy status to other drugs, medicaments and biological substances status: Secondary | ICD-10-CM | POA: Diagnosis not present

## 2024-07-13 DIAGNOSIS — Z01818 Encounter for other preprocedural examination: Secondary | ICD-10-CM

## 2024-07-13 DIAGNOSIS — I10 Essential (primary) hypertension: Secondary | ICD-10-CM | POA: Diagnosis present

## 2024-07-13 DIAGNOSIS — Z79899 Other long term (current) drug therapy: Secondary | ICD-10-CM | POA: Diagnosis not present

## 2024-07-13 DIAGNOSIS — Z6841 Body Mass Index (BMI) 40.0 and over, adult: Secondary | ICD-10-CM

## 2024-07-13 DIAGNOSIS — Z9884 Bariatric surgery status: Secondary | ICD-10-CM | POA: Diagnosis not present

## 2024-07-13 DIAGNOSIS — Z833 Family history of diabetes mellitus: Secondary | ICD-10-CM | POA: Diagnosis not present

## 2024-07-13 DIAGNOSIS — Z886 Allergy status to analgesic agent status: Secondary | ICD-10-CM

## 2024-07-13 DIAGNOSIS — Z9049 Acquired absence of other specified parts of digestive tract: Secondary | ICD-10-CM | POA: Diagnosis not present

## 2024-07-13 DIAGNOSIS — M5416 Radiculopathy, lumbar region: Principal | ICD-10-CM

## 2024-07-13 SURGERY — ANTERIOR LATERAL LUMBAR FUSION WITH PERCUTANEOUS SCREW 1 LEVEL
Anesthesia: General | Site: Spine Lumbar

## 2024-07-13 MED ORDER — OXYCODONE HCL 5 MG PO TABS
ORAL_TABLET | ORAL | Status: AC
  Filled 2024-07-13: qty 2

## 2024-07-13 MED ORDER — PHENYLEPHRINE HCL (PRESSORS) 10 MG/ML IV SOLN
INTRAVENOUS | Status: AC
  Filled 2024-07-13: qty 1

## 2024-07-13 MED ORDER — PANTOPRAZOLE SODIUM 40 MG PO TBEC
40.0000 mg | DELAYED_RELEASE_TABLET | Freq: Every day | ORAL | Status: DC
Start: 2024-07-13 — End: 2024-07-14
  Administered 2024-07-13 – 2024-07-14 (×3): 40 mg via ORAL
  Filled 2024-07-13 (×2): qty 1

## 2024-07-13 MED ORDER — SODIUM CHLORIDE 0.9% FLUSH: 3.0000 mL | INTRAVENOUS | Status: DC | PRN

## 2024-07-13 MED ORDER — FENTANYL CITRATE (PF) 100 MCG/2ML IJ SOLN
INTRAMUSCULAR | Status: AC
  Filled 2024-07-13: qty 2

## 2024-07-13 MED ORDER — ACETAMINOPHEN 500 MG PO TABS
ORAL_TABLET | ORAL | Status: AC
  Filled 2024-07-13: qty 2

## 2024-07-13 MED ORDER — REMIFENTANIL HCL 1 MG IV SOLR
INTRAVENOUS | Status: AC
  Filled 2024-07-13: qty 1000

## 2024-07-13 MED ORDER — DEXMEDETOMIDINE HCL IN NACL 80 MCG/20ML IV SOLN
INTRAVENOUS | Status: DC | PRN
Start: 2024-07-13 — End: 2024-07-13
  Administered 2024-07-13 (×2): 8 ug via INTRAVENOUS

## 2024-07-13 MED ORDER — PHENOL 1.4 % MT LIQD: 1.0000 | OROMUCOSAL | Status: DC | PRN

## 2024-07-13 MED ORDER — PHENYLEPHRINE HCL-NACL 20-0.9 MG/250ML-% IV SOLN
INTRAVENOUS | Status: DC | PRN
  Administered 2024-07-13 (×2): 40 ug/min via INTRAVENOUS

## 2024-07-13 MED ORDER — BUPIVACAINE LIPOSOME 1.3 % IJ SUSP
INTRAMUSCULAR | Status: AC
  Filled 2024-07-13: qty 20

## 2024-07-13 MED ORDER — SURGIFLO WITH THROMBIN (HEMOSTATIC MATRIX KIT) OPTIME
TOPICAL | Status: DC | PRN
Start: 2024-07-13 — End: 2024-07-13
  Administered 2024-07-13 (×2): 1 via TOPICAL

## 2024-07-13 MED ORDER — OXYCODONE HCL 5 MG/5ML PO SOLN: 5.0000 mg | Freq: Once | ORAL | Status: DC | PRN

## 2024-07-13 MED ORDER — SODIUM CHLORIDE (PF) 0.9 % IJ SOLN
INTRAMUSCULAR | Status: AC
Start: 2024-07-13 — End: 2024-07-13
  Filled 2024-07-13: qty 20

## 2024-07-13 MED ORDER — DEXAMETHASONE SODIUM PHOSPHATE 10 MG/ML IJ SOLN
INTRAMUSCULAR | Status: DC | PRN
Start: 2024-07-13 — End: 2024-07-13
  Administered 2024-07-13 (×2): 10 mg via INTRAVENOUS

## 2024-07-13 MED ORDER — DIPHENHYDRAMINE HCL 50 MG/ML IJ SOLN
INTRAMUSCULAR | Status: DC | PRN
  Administered 2024-07-13 (×2): 12.5 mg via INTRAVENOUS

## 2024-07-13 MED ORDER — SUCCINYLCHOLINE CHLORIDE 200 MG/10ML IV SOSY
PREFILLED_SYRINGE | INTRAVENOUS | Status: AC
  Filled 2024-07-13: qty 10

## 2024-07-13 MED ORDER — SODIUM CHLORIDE 0.9 % IV SOLN: 250.0000 mL | INTRAVENOUS | Status: DC

## 2024-07-13 MED ORDER — ACETAMINOPHEN 325 MG PO TABS: 650.0000 mg | ORAL_TABLET | ORAL | Status: DC | PRN

## 2024-07-13 MED ORDER — VANCOMYCIN HCL IN DEXTROSE 1-5 GM/200ML-% IV SOLN
INTRAVENOUS | Status: AC
Start: 2024-07-13 — End: 2024-07-13
  Filled 2024-07-13: qty 200

## 2024-07-13 MED ORDER — SODIUM CHLORIDE (PF) 0.9 % IJ SOLN
INTRAMUSCULAR | Status: DC | PRN
  Administered 2024-07-13 (×2): 60 mL via INTRAMUSCULAR

## 2024-07-13 MED ORDER — IRRISEPT - 450ML BOTTLE WITH 0.05% CHG IN STERILE WATER, USP 99.95% OPTIME
TOPICAL | Status: DC | PRN
  Administered 2024-07-13 (×2): 450 mL via TOPICAL

## 2024-07-13 MED ORDER — SENNA 8.6 MG PO TABS
1.0000 | ORAL_TABLET | Freq: Two times a day (BID) | ORAL | Status: DC
  Administered 2024-07-13 – 2024-07-14 (×3): 8.6 mg via ORAL
  Filled 2024-07-13 (×2): qty 1

## 2024-07-13 MED ORDER — DEXAMETHASONE SODIUM PHOSPHATE 10 MG/ML IJ SOLN
INTRAMUSCULAR | Status: AC
  Filled 2024-07-13: qty 1

## 2024-07-13 MED ORDER — CLONAZEPAM 0.5 MG PO TABS: 0.5000 mg | ORAL_TABLET | Freq: Two times a day (BID) | ORAL | Status: DC | PRN

## 2024-07-13 MED ORDER — DROPERIDOL 2.5 MG/ML IJ SOLN: 0.6250 mg | Freq: Once | INTRAMUSCULAR | Status: DC | PRN

## 2024-07-13 MED ORDER — SUCCINYLCHOLINE CHLORIDE 200 MG/10ML IV SOSY
PREFILLED_SYRINGE | INTRAVENOUS | Status: DC | PRN
  Administered 2024-07-13 (×2): 140 mg via INTRAVENOUS

## 2024-07-13 MED ORDER — DULOXETINE HCL 60 MG PO CPEP
60.0000 mg | ORAL_CAPSULE | Freq: Every day | ORAL | Status: DC
  Administered 2024-07-14: 60 mg via ORAL
  Filled 2024-07-13: qty 1
  Filled 2024-07-13: qty 2

## 2024-07-13 MED ORDER — ONDANSETRON HCL 4 MG/2ML IJ SOLN
INTRAMUSCULAR | Status: DC | PRN
  Administered 2024-07-13 (×2): 4 mg via INTRAVENOUS

## 2024-07-13 MED ORDER — SODIUM CHLORIDE 0.9% FLUSH
3.0000 mL | Freq: Two times a day (BID) | INTRAVENOUS | Status: DC
  Administered 2024-07-13 – 2024-07-14 (×3): 3 mL via INTRAVENOUS

## 2024-07-13 MED ORDER — MENTHOL 3 MG MT LOZG: 1.0000 | LOZENGE | OROMUCOSAL | Status: DC | PRN

## 2024-07-13 MED ORDER — ACETAMINOPHEN 650 MG RE SUPP: 650.0000 mg | RECTAL | Status: DC | PRN

## 2024-07-13 MED ORDER — OXYCODONE HCL 5 MG PO TABS: 5.0000 mg | ORAL_TABLET | Freq: Once | ORAL | Status: DC | PRN

## 2024-07-13 MED ORDER — KETAMINE HCL 10 MG/ML IJ SOLN
INTRAMUSCULAR | Status: DC | PRN
  Administered 2024-07-13 (×2): 50 mg via INTRAVENOUS

## 2024-07-13 MED ORDER — ACETAMINOPHEN 500 MG PO TABS
1000.0000 mg | ORAL_TABLET | Freq: Four times a day (QID) | ORAL | Status: DC
  Administered 2024-07-13 – 2024-07-14 (×4): 1000 mg via ORAL
  Filled 2024-07-13 (×3): qty 2

## 2024-07-13 MED ORDER — PROPOFOL 1000 MG/100ML IV EMUL
INTRAVENOUS | Status: AC
  Filled 2024-07-13: qty 100

## 2024-07-13 MED ORDER — FENTANYL CITRATE (PF) 100 MCG/2ML IJ SOLN
25.0000 ug | INTRAMUSCULAR | Status: DC | PRN
  Administered 2024-07-13 (×8): 25 ug via INTRAVENOUS

## 2024-07-13 MED ORDER — ONDANSETRON HCL 4 MG/2ML IJ SOLN: 4.0000 mg | Freq: Four times a day (QID) | INTRAMUSCULAR | Status: DC | PRN

## 2024-07-13 MED ORDER — GLYCOPYRROLATE 0.2 MG/ML IJ SOLN
INTRAMUSCULAR | Status: DC | PRN
  Administered 2024-07-13 (×2): .1 mg via INTRAVENOUS

## 2024-07-13 MED ORDER — HYDROMORPHONE HCL 1 MG/ML IJ SOLN
INTRAMUSCULAR | Status: DC | PRN
  Administered 2024-07-13 (×2): 1 mg via INTRAVENOUS

## 2024-07-13 MED ORDER — BUPIVACAINE-EPINEPHRINE 0.5% -1:200000 IJ SOLN
INTRAMUSCULAR | Status: DC | PRN
Start: 2024-07-13 — End: 2024-07-13
  Administered 2024-07-13 (×2): 20 mL

## 2024-07-13 MED ORDER — OXYCODONE HCL 5 MG PO TABS: 5.0000 mg | ORAL_TABLET | ORAL | Status: DC | PRN

## 2024-07-13 MED ORDER — ONDANSETRON HCL 4 MG PO TABS: 4.0000 mg | ORAL_TABLET | Freq: Four times a day (QID) | ORAL | Status: DC | PRN

## 2024-07-13 MED ORDER — SUGAMMADEX SODIUM 200 MG/2ML IV SOLN
INTRAVENOUS | Status: DC | PRN
  Administered 2024-07-13 (×2): 400 mg via INTRAVENOUS

## 2024-07-13 MED ORDER — PROPOFOL 10 MG/ML IV BOLUS
INTRAVENOUS | Status: DC | PRN
  Administered 2024-07-13 (×2): 170 mg via INTRAVENOUS

## 2024-07-13 MED ORDER — HYDROMORPHONE HCL 1 MG/ML IJ SOLN: 0.5000 mg | INTRAMUSCULAR | Status: AC | PRN

## 2024-07-13 MED ORDER — LIDOCAINE HCL (CARDIAC) PF 100 MG/5ML IV SOSY
PREFILLED_SYRINGE | INTRAVENOUS | Status: DC | PRN
  Administered 2024-07-13 (×2): 80 mg via INTRAVENOUS

## 2024-07-13 MED ORDER — ENOXAPARIN SODIUM 40 MG/0.4ML IJ SOSY
40.0000 mg | PREFILLED_SYRINGE | INTRAMUSCULAR | Status: DC
  Administered 2024-07-14: 40 mg via SUBCUTANEOUS
  Filled 2024-07-13: qty 0.4

## 2024-07-13 MED ORDER — CYCLOBENZAPRINE HCL 10 MG PO TABS
10.0000 mg | ORAL_TABLET | Freq: Three times a day (TID) | ORAL | Status: DC | PRN
  Administered 2024-07-13 (×2): 10 mg via ORAL
  Filled 2024-07-13: qty 1

## 2024-07-13 MED ORDER — POLYETHYLENE GLYCOL 3350 17 G PO PACK: 17.0000 g | PACK | Freq: Every day | ORAL | Status: DC | PRN

## 2024-07-13 MED ORDER — BUPIVACAINE-EPINEPHRINE (PF) 0.5% -1:200000 IJ SOLN
INTRAMUSCULAR | Status: AC
Start: 2024-07-13 — End: 2024-07-13
  Filled 2024-07-13: qty 20

## 2024-07-13 MED ORDER — PROPOFOL 500 MG/50ML IV EMUL
INTRAVENOUS | Status: DC | PRN
Start: 2024-07-13 — End: 2024-07-13
  Administered 2024-07-13 (×4): 100 ug/kg/min via INTRAVENOUS

## 2024-07-13 MED ORDER — PHENYLEPHRINE 80 MCG/ML (10ML) SYRINGE FOR IV PUSH (FOR BLOOD PRESSURE SUPPORT)
PREFILLED_SYRINGE | INTRAVENOUS | Status: DC | PRN
  Administered 2024-07-13 (×2): 160 ug via INTRAVENOUS
  Administered 2024-07-13 (×2): 80 ug via INTRAVENOUS

## 2024-07-13 MED ORDER — REMIFENTANIL HCL 1 MG IV SOLR
INTRAVENOUS | Status: AC
Start: 2024-07-13 — End: 2024-07-13
  Filled 2024-07-13: qty 1000

## 2024-07-13 MED ORDER — 0.9 % SODIUM CHLORIDE (POUR BTL) OPTIME
TOPICAL | Status: DC | PRN
Start: 2024-07-13 — End: 2024-07-13
  Administered 2024-07-13 (×2): 500 mL

## 2024-07-13 MED ORDER — PROPOFOL 10 MG/ML IV BOLUS
INTRAVENOUS | Status: AC
  Filled 2024-07-13: qty 20

## 2024-07-13 MED ORDER — FLUTICASONE PROPIONATE 50 MCG/ACT NA SUSP: 2.0000 | Freq: Every day | NASAL | Status: DC | PRN

## 2024-07-13 MED ORDER — ACETAMINOPHEN 10 MG/ML IV SOLN
INTRAVENOUS | Status: AC
  Filled 2024-07-13: qty 100

## 2024-07-13 MED ORDER — ROCURONIUM BROMIDE 10 MG/ML (PF) SYRINGE
PREFILLED_SYRINGE | INTRAVENOUS | Status: DC | PRN
  Administered 2024-07-13: 30 mg via INTRAVENOUS
  Administered 2024-07-13 (×2): 20 mg via INTRAVENOUS
  Administered 2024-07-13: 30 mg via INTRAVENOUS

## 2024-07-13 MED ORDER — KETAMINE HCL 50 MG/5ML IJ SOSY
PREFILLED_SYRINGE | INTRAMUSCULAR | Status: AC
  Filled 2024-07-13: qty 5

## 2024-07-13 MED ORDER — NYSTATIN 100000 UNIT/GM EX POWD
1.0000 | Freq: Three times a day (TID) | CUTANEOUS | Status: DC
  Administered 2024-07-13 – 2024-07-14 (×3): 1 via TOPICAL
  Filled 2024-07-13 (×2): qty 15

## 2024-07-13 MED ORDER — ACETAMINOPHEN 10 MG/ML IV SOLN
INTRAVENOUS | Status: DC | PRN
  Administered 2024-07-13 (×2): 1000 mg via INTRAVENOUS

## 2024-07-13 MED ORDER — PHENYLEPHRINE 80 MCG/ML (10ML) SYRINGE FOR IV PUSH (FOR BLOOD PRESSURE SUPPORT)
PREFILLED_SYRINGE | INTRAVENOUS | Status: AC
  Filled 2024-07-13: qty 40

## 2024-07-13 MED ORDER — HYDROMORPHONE HCL 1 MG/ML IJ SOLN
INTRAMUSCULAR | Status: AC
Start: 2024-07-13 — End: 2024-07-13
  Filled 2024-07-13: qty 1

## 2024-07-13 MED ORDER — CHLORHEXIDINE GLUCONATE 0.12 % MT SOLN
OROMUCOSAL | Status: AC
  Filled 2024-07-13: qty 15

## 2024-07-13 MED ORDER — FENTANYL CITRATE (PF) 100 MCG/2ML IJ SOLN
INTRAMUSCULAR | Status: DC | PRN
  Administered 2024-07-13 (×4): 50 ug via INTRAVENOUS

## 2024-07-13 MED ORDER — DOCUSATE SODIUM 100 MG PO CAPS
100.0000 mg | ORAL_CAPSULE | Freq: Two times a day (BID) | ORAL | Status: DC
  Administered 2024-07-13 – 2024-07-14 (×3): 100 mg via ORAL
  Filled 2024-07-13 (×2): qty 1

## 2024-07-13 MED ORDER — CLONIDINE HCL 0.1 MG/24HR TD PTWK
0.1000 mg | MEDICATED_PATCH | TRANSDERMAL | Status: DC
  Administered 2024-07-14: 0.1 mg via TRANSDERMAL
  Filled 2024-07-13: qty 1

## 2024-07-13 MED ORDER — MIDAZOLAM HCL 2 MG/2ML IJ SOLN
INTRAMUSCULAR | Status: DC | PRN
  Administered 2024-07-13 (×2): 2 mg via INTRAVENOUS

## 2024-07-13 MED ORDER — SORBITOL 70 % SOLN: 30.0000 mL | Freq: Every day | Status: DC | PRN

## 2024-07-13 MED ORDER — MAGNESIUM CITRATE PO SOLN: 1.0000 | Freq: Once | ORAL | Status: DC | PRN

## 2024-07-13 MED ORDER — OXYCODONE HCL 5 MG PO TABS
10.0000 mg | ORAL_TABLET | ORAL | Status: DC | PRN
  Administered 2024-07-13 (×4): 10 mg via ORAL
  Filled 2024-07-13: qty 2

## 2024-07-13 MED ORDER — BUPIVACAINE HCL (PF) 0.5 % IJ SOLN
INTRAMUSCULAR | Status: AC
Start: 2024-07-13 — End: 2024-07-13
  Filled 2024-07-13: qty 30

## 2024-07-13 MED ORDER — ACETAMINOPHEN 10 MG/ML IV SOLN
1000.0000 mg | Freq: Once | INTRAVENOUS | Status: DC | PRN
Start: 2024-07-13 — End: 2024-07-13

## 2024-07-13 MED ORDER — SODIUM CHLORIDE 0.9 % IV SOLN
INTRAVENOUS | Status: DC | PRN
  Administered 2024-07-13 (×2): .2 ug/kg/min via INTRAVENOUS

## 2024-07-13 MED ORDER — MIDAZOLAM HCL 2 MG/2ML IJ SOLN
INTRAMUSCULAR | Status: AC
  Filled 2024-07-13: qty 2

## 2024-07-13 MED ORDER — ONDANSETRON HCL 4 MG/2ML IJ SOLN
INTRAMUSCULAR | Status: AC
  Filled 2024-07-13: qty 2

## 2024-07-13 MED ORDER — LIDOCAINE HCL (PF) 2 % IJ SOLN
INTRAMUSCULAR | Status: AC
Start: 2024-07-13 — End: 2024-07-13
  Filled 2024-07-13: qty 5

## 2024-07-13 MED ORDER — DIPHENHYDRAMINE HCL 50 MG/ML IJ SOLN
INTRAMUSCULAR | Status: AC
  Filled 2024-07-13: qty 1

## 2024-07-13 SURGICAL SUPPLY — 68 items
ALLOGRAFT BONESTRIP KORE 2.5X5 (Bone Implant) IMPLANT
BASIN KIT SINGLE STR (MISCELLANEOUS) ×1 IMPLANT
CHLORAPREP W/TINT 26 (MISCELLANEOUS) ×2 IMPLANT
CORD BIP STRL DISP 12FT (MISCELLANEOUS) ×1 IMPLANT
CORD LIGHT LATERIAL X LIFT (MISCELLANEOUS) IMPLANT
DERMABOND ADVANCED .7 DNX12 (GAUZE/BANDAGES/DRESSINGS) ×3 IMPLANT
DRAPE C ARM PK CFD 31 SPINE (DRAPES) ×1 IMPLANT
DRAPE C-ARMOR (DRAPES) IMPLANT
DRAPE HD 5FT BACK TABLE (DRAPES) ×1 IMPLANT
DRAPE INCISE IOBAN 66X45 STRL (DRAPES) ×1 IMPLANT
DRAPE LAPAROTOMY 100X77 ABD (DRAPES) ×2 IMPLANT
DRAPE MICROSCOPE SPINE 48X150 (DRAPES) IMPLANT
DRAPE POUCH INSTRU U-SHP 10X18 (DRAPES) ×1 IMPLANT
DRAPE SCAN PATIENT (DRAPES) ×1 IMPLANT
DRAPE TABLE BACK 80X90 (DRAPES) ×1 IMPLANT
DRSG OPSITE POSTOP 4X6 (GAUZE/BANDAGES/DRESSINGS) IMPLANT
DRSG OPSITE POSTOP 4X8 (GAUZE/BANDAGES/DRESSINGS) IMPLANT
DRSG TEGADERM 2-3/8X2-3/4 SM (GAUZE/BANDAGES/DRESSINGS) IMPLANT
DRSG TEGADERM 4X4.75 (GAUZE/BANDAGES/DRESSINGS) IMPLANT
DRSG TEGADERM 6X8 (GAUZE/BANDAGES/DRESSINGS) IMPLANT
ELECTRODE REM PT RTRN 9FT ADLT (ELECTROSURGICAL) ×2 IMPLANT
EX-PIN ORTHOLOCK NAV 4X150 (PIN) ×1 IMPLANT
FEE CVG SUPP BRAINLAB NG SPNE (MISCELLANEOUS) ×1 IMPLANT
FEE INTRAOP CADWELL SUPPLY NCS (MISCELLANEOUS) IMPLANT
FEE INTRAOP MONITOR IMPULS NCS (MISCELLANEOUS) IMPLANT
FORCEPS BPLR BAYO 10IN 1.0TIP (ORTHOPEDIC DISPOSABLE SUPPLIES) IMPLANT
GAUZE 4X4 16PLY ~~LOC~~+RFID DBL (SPONGE) IMPLANT
GAUZE SPONGE 2X2 STRL 8-PLY (GAUZE/BANDAGES/DRESSINGS) IMPLANT
GLOVE BIOGEL PI IND STRL 6.5 (GLOVE) ×3 IMPLANT
GLOVE SURG SYN 6.5 PF PI (GLOVE) ×5 IMPLANT
GLOVE SURG SYN 8.5 PF PI (GLOVE) ×6 IMPLANT
GOWN SRG LRG LVL 4 IMPRV REINF (GOWNS) ×2 IMPLANT
GOWN SRG XL LVL 3 NONREINFORCE (GOWNS) ×2 IMPLANT
GUIDEWIRE NITINOL BEVEL TIP (WIRE) IMPLANT
HOLDER FOLEY CATH W/STRAP (MISCELLANEOUS) ×1 IMPLANT
KIT DILATOR XLIF 5 (KITS) IMPLANT
KIT DISP MARS 3V (KITS) IMPLANT
KIT SPINAL PRONEVIEW (KITS) ×1 IMPLANT
KIT TURNOVER KIT A (KITS) ×1 IMPLANT
LAVAGE JET IRRISEPT WOUND (IRRIGATION / IRRIGATOR) ×1 IMPLANT
MANIFOLD NEPTUNE II (INSTRUMENTS) ×2 IMPLANT
MARKER SKIN DUAL TIP RULER LAB (MISCELLANEOUS) ×1 IMPLANT
MARKER SPHERE PSV REFLC 13MM (MARKER) ×7 IMPLANT
MODULE NVM5 NEXT GEN EMG (NEUROSURGERY SUPPLIES) IMPLANT
NDL SAFETY ECLIPSE 18X1.5 (NEEDLE) ×1 IMPLANT
NS IRRIG 500ML POUR BTL (IV SOLUTION) ×1 IMPLANT
PACK LAMINECTOMY ARMC (PACKS) ×1 IMPLANT
PAD ARMBOARD POSITIONER FOAM (MISCELLANEOUS) ×1 IMPLANT
PENCIL SMOKE EVACUATOR (MISCELLANEOUS) ×1 IMPLANT
ROD RELINE MAS LORD 5.5X45MM (Rod) IMPLANT
ROD RELINE MAS TI LORD 5.5X40 (Rod) IMPLANT
SCREW LOCK RELINE 5.5 TULIP (Screw) IMPLANT
SCREW RELINE RED 6.5X45MM POLY (Screw) IMPLANT
SPACER HEDRON 10D 18X50X11 (Spacer) IMPLANT
STAPLER SKIN PROX 35W (STAPLE) IMPLANT
SURGIFLO W/THROMBIN 8M KIT (HEMOSTASIS) ×1 IMPLANT
SUT STRATA 3-0 15 PS-2 (SUTURE) ×2 IMPLANT
SUT VIC AB 0 CT1 27XCR 8 STRN (SUTURE) ×2 IMPLANT
SUT VIC AB 2-0 CT1 18 (SUTURE) ×2 IMPLANT
SUTURE EHLN 3-0 FS-10 30 BLK (SUTURE) IMPLANT
SYR 30ML LL (SYRINGE) ×2 IMPLANT
TAPE CLOTH 3X10 WHT NS LF (GAUZE/BANDAGES/DRESSINGS) ×4 IMPLANT
TOWEL OR 17X26 4PK STRL BLUE (TOWEL DISPOSABLE) ×2 IMPLANT
TRAP FLUID SMOKE EVACUATOR (MISCELLANEOUS) ×1 IMPLANT
TRAY FOLEY SLVR 16FR LF STAT (SET/KITS/TRAYS/PACK) ×1 IMPLANT
TUBING CONNECTING 10 (TUBING) ×1 IMPLANT
WATER STERILE IRR 1000ML POUR (IV SOLUTION) IMPLANT
WATER STERILE IRR 500ML POUR (IV SOLUTION) IMPLANT

## 2024-07-13 NOTE — Interval H&P Note (Signed)
 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  The various methods of treatment have been discussed with the patient and family. After consideration of risks, benefits and other options for treatment, the patient has consented to  Procedure(s) with comments: L4-5 LATERAL LUMBAR INTERBODY FUSION AND POSTERIOR SPINAL FUSION (N/A) - L4-5 LATERAL LUMBAR INTERBODY FUSION AND POSTERIOR SPINAL FUSION APPLICATION OF INTRAOPERATIVE CT SCAN (N/A) as a surgical intervention.  The patient's history has been reviewed, patient examined, no change in status, stable for surgery.  I have reviewed the patient's chart and labs.  Questions were answered to the patient's satisfaction.    Heart sounds normal no MRG. Chest Clear to Auscultation Bilaterally.   Kim Watson

## 2024-07-13 NOTE — Transfer of Care (Signed)
 Immediate Anesthesia Transfer of Care Note  Patient: Kim Watson  Procedure(s) Performed: L4-5 LATERAL LUMBAR INTERBODY FUSION AND POSTERIOR SPINAL FUSION (Spine Lumbar) APPLICATION OF INTRAOPERATIVE CT SCAN (Spine Lumbar)  Patient Location: PACU  Anesthesia Type:General  Level of Consciousness: drowsy  Airway & Oxygen Therapy: Patient Spontanous Breathing and Patient connected to face mask oxygen  Post-op Assessment: Report given to RN and Post -op Vital signs reviewed and stable  Post vital signs: Reviewed and stable  Last Vitals:  Vitals Value Taken Time  BP 156/111 07/13/24 15:30  Temp    Pulse 88 07/13/24 15:38  Resp 13 07/13/24 15:38  SpO2 94 % 07/13/24 15:38  Vitals shown include unfiled device data.  Last Pain:  Vitals:   07/13/24 1106  TempSrc: Temporal  PainSc: 2          Complications: No notable events documented.

## 2024-07-13 NOTE — Anesthesia Preprocedure Evaluation (Signed)
 Anesthesia Evaluation  Patient identified by MRN, date of birth, ID band Patient awake    Reviewed: Allergy & Precautions, H&P , NPO status , Patient's Chart, lab work & pertinent test results, reviewed documented beta blocker date and time   Airway Mallampati: II  TM Distance: >3 FB Neck ROM: full    Dental  (+) Teeth Intact   Pulmonary sleep apnea and Continuous Positive Airway Pressure Ventilation , pneumonia, resolved   Pulmonary exam normal        Cardiovascular Exercise Tolerance: Good hypertension, On Medications negative cardio ROS Normal cardiovascular exam Rhythm:regular Rate:Normal     Neuro/Psych  Headaches PSYCHIATRIC DISORDERS Anxiety Depression     Neuromuscular disease    GI/Hepatic Neg liver ROS,GERD  Medicated,,  Endo/Other  negative endocrine ROS    Renal/GU negative Renal ROS  negative genitourinary   Musculoskeletal   Abdominal   Peds  Hematology negative hematology ROS (+)   Anesthesia Other Findings Past Medical History: No date: Anxiety No date: Arthritis No date: Depression No date: Gallbladder problem No date: GERD (gastroesophageal reflux disease) No date: High blood pressure No date: Hypertension No date: Migraine No date: Obesity No date: Obstructive sleep apnea     Comment:  2016 was cleared from having to use CPAP at night.               Gastric bypass surgery 2015. No date: Pneumonia No date: Spondylolisthesis of lumbar region No date: Vitamin D  deficiency Past Surgical History: 10/02/2022: ANTERIOR CERVICAL DECOMP/DISCECTOMY FUSION; N/A     Comment:  Procedure: ANTERIOR CERVICAL DISCECTOMY AND FUSION               CERVICAL FOUR TO SEVEN;  Surgeon: Burnetta Aures, MD;                Location: [facility] OR;  Service: Orthopedics;  Laterality: N/A;              4hrs 3 C-Bed 12/2023: CARPAL TUNNEL RELEASE; Left No date: CHOLECYSTECTOMY 07/26/2022: FOOT SURGERY; Left     Comment:   5th metatarsal No date: SLEEVE GASTROPLASTY No date: TUBAL LIGATION BMI    Body Mass Index: 42.59 kg/m     Reproductive/Obstetrics negative OB ROS                              Anesthesia Physical Anesthesia Plan  ASA: 3  Anesthesia Plan: General ETT   Post-op Pain Management:    Induction:   PONV Risk Score and Plan: 4 or greater  Airway Management Planned:   Additional Equipment:   Intra-op Plan:   Post-operative Plan:   Informed Consent: I have reviewed the patients History and Physical, chart, labs and discussed the procedure including the risks, benefits and alternatives for the proposed anesthesia with the patient or authorized representative who has indicated his/her understanding and acceptance.     Dental Advisory Given  Plan Discussed with: CRNA  Anesthesia Plan Comments:         Anesthesia Quick Evaluation

## 2024-07-13 NOTE — Anesthesia Procedure Notes (Signed)
 Procedure Name: Intubation Date/Time: 07/13/2024 12:42 PM  Performed by: Blair Suzen BRAVO, RNPre-anesthesia Checklist: Patient identified, Emergency Drugs available, Suction available and Patient being monitored Patient Re-evaluated:Patient Re-evaluated prior to induction Oxygen Delivery Method: Circle System Utilized Preoxygenation: Pre-oxygenation with 100% oxygen Induction Type: IV induction Ventilation: Mask ventilation without difficulty Laryngoscope Size: Mac and 3 Grade View: Grade I Tube type: Oral Tube size: 7.0 mm Number of attempts: 1 Airway Equipment and Method: Stylet and Oral airway Placement Confirmation: ETT inserted through vocal cords under direct vision, positive ETCO2 and breath sounds checked- equal and bilateral Secured at: 22 cm Tube secured with: Tape Dental Injury: Teeth and Oropharynx as per pre-operative assessment

## 2024-07-13 NOTE — Discharge Instructions (Signed)
 Your surgeon has performed an operation on your lumbar spine (low back) to relieve pressure on one or more nerves. Many times, patients feel better immediately after surgery and can "overdo it." Even if you feel well, it is important that you follow these activity guidelines. If you do not let your back heal properly from the surgery, you can increase the chance of hardware complications and/or return of your symptoms. The following are instructions to help in your recovery once you have been discharged from the hospital.  Do not use NSAIDs for 3 months after surgery.  *Regarding compression stockings-  Please wear day and night until you are walking a couple hundred feet three times a day.   Activity    No bending, lifting, or twisting ("BLT"). Avoid lifting objects heavier than 10 pounds (gallon milk jug).  Where possible, avoid household activities that involve lifting, bending, pushing, or pulling such as laundry, vacuuming, grocery shopping, and childcare. Try to arrange for help from friends and family for these activities while your back heals.  Increase physical activity slowly as tolerated.  Taking short walks is encouraged, but avoid strenuous exercise. Do not jog, run, bicycle, lift weights, or participate in any other exercises unless specifically allowed by your doctor. Avoid prolonged sitting, including car rides.  Talk to your doctor before resuming sexual activity.  You should not drive until cleared by your doctor.  Until released by your doctor, you should not return to work or school.  You should rest at home and let your body heal.   You may shower three days after your surgery.  After showering, lightly dab your incision dry. Do not take a tub bath or go swimming for 3 weeks, or until approved by your doctor at your follow-up appointment.  If you smoke, we strongly recommend that you quit.  Smoking has been proven to interfere with normal healing in your back and will  dramatically reduce the success rate of your surgery. Please contact [facility] (800-QUIT-NOW) and use the resources at www.[facility].com for assistance in stopping smoking.  Surgical Incision   If you have a dressing on your incision, you may remove it three days after your surgery. Keep your incision area clean and dry.  Your incision was closed with Dermabond glue. The glue should begin to peel away within about a week.  Diet            You may return to your usual diet. Be sure to stay hydrated.  When to Contact Us   Although your surgery and recovery will likely be uneventful, you may have some residual numbness, aches, and pains in your back and/or legs. This is normal and should improve in the next few weeks.  However, should you experience any of the following, contact us  immediately: New numbness or weakness Pain that is progressively getting worse, and is not relieved by your pain medications or rest Bleeding, redness, swelling, pain, or drainage from surgical incision Chills or flu-like symptoms Fever greater than 101.0 F (38.3 C) Problems with bowel or bladder functions Difficulty breathing or shortness of breath Warmth, tenderness, or swelling in your calf  Contact Information How to contact us :  If you have any questions/concerns before or after surgery, you can reach us  at [phone], or you can send a mychart message. We can be reached by phone or mychart 8am-4pm, Monday-Friday.  *Please note: Calls after 4pm are forwarded to a third party answering service. Mychart messages are not routinely monitored during evenings,  weekends, and holidays. Please call our office to contact the answering service for urgent concerns during non-business hours.

## 2024-07-13 NOTE — Op Note (Signed)
 Indications: [name] is a 53 y.o. female with M43.16 Spondylolisthesis of lumbar region, M54.41, G89.29 Chronic bilateral low back pain with right-sided sciatica, M53.2X2 Spinal instability, lumbar   Findings: expansion of disc space  Preoperative Diagnosis: M43.16 Spondylolisthesis of lumbar region, M54.41, G89.29 Chronic bilateral low back pain with right-sided sciatica, M53.2X2 Spinal instability, lumbar  Postoperative Diagnosis: same   EBL: 50 ml IVF: see anesthesia record Drains: none Disposition: Extubated and Stable to PACU Complications: none  A foley catheter was placed.   Preoperative Note:   Risks of surgery discussed include: infection, bleeding, stroke, coma, death, paralysis, CSF leak, nerve/spinal cord injury, numbness, tingling, weakness, complex regional pain syndrome, recurrent stenosis and/or disc herniation, vascular injury, development of instability, neck/back pain, need for further surgery, persistent symptoms, development of deformity, and the risks of anesthesia. The patient understood these risks and agreed to proceed.  NAME OF ANTERIOR PROCEDURE:               1. Anterior lumbar interbody fusion via a right lateral retroperitoneal approach at L4/5 2. Placement of a Lordotic  Globus Hedron interbody cage, filled with Demineralized Bone Matrix  NAME OF POSTERIOR PROCEDURE 1. Posterior instrumentation using Nuvasive Reline Instrumentation 2. Posterolateral fusion, L4/5, utilizing demineralized bone matrix 3. Use of Stereotaxis   PROCEDURE:  Patient was brought to the operating room, intubated, turned to the lateral position.  All pressure points were checked and double-checked.  The patient was prepped and draped in the standard fashion. Prior to prepping, fluoroscopy was brought in and the patient was positioned with a large bump under the contralateral side between the iliac crest and rib cage, allowing the area between the iliac crest and the lateral  aspect of the rib cage to open and increase the ability to reach inferiorly, to facilitate entry into the disc space.  The incision was marked upon the skin both the location of the disc space as well as the superior most aspect of the iliac crest.  Based on the identification of the disc space an incision was prepared, marked upon the skin and eventually was used for our lateral incision.  The fluoroscopy was turned into a cross table A/P image in order to confirm that the patient's spine remained in a perpendicular trajectory to the floor without rotation.  Once confirming that all the pressure points were checked and double-checked and the patient remained in sturdy position strapped down in this slightly jack-knifed lateral position, the patient was prepped and draped in standard fashion.  The skin was injected with local anesthetic, then incised until the abdominal wall fascia was noted.  I bluntly dissected posteriorly until we were able to identify the posterior musculature near petit's triangle.  At this point, using primarily blunt dissection with our finger aided with a metzenbaum scissor, were able to enter the retroperitoneal cavity.  The retroperitoneal potential space was opened further until palpating out the psoas muscle, the medial aspect of the iliac crest, the medial aspect of the last rib and continued to define the retroperitoneal space with blunt dissection in order to facilitate safe placement of our dilators.    While protecting by dissecting directly onto a finger in the retroperitoneum, the retroperitoneal space was entered safely from the lateral incision and the initial dilator placed onto the muscle belly of the psoas.  While directly stimulating the dilator and after radiographically confirming our location relative to the disc space, I placed the dilator through the psoas.  The dilators  were stimulated to ensure remaining safely away from any of the lumbar plexus nerves; the  dilators were repositioned until no pathologic stimulation was appreciated.  Once I had confirmed the location of our initial dilator radiographically, a K-wire secured the dilator into the L4/5 disc space and confirmed position under A/P and lateral fluoroscopy.  At this point, I dilated up with direct stimulation to confirm lack of pathologic stimulation.  Once all the dilators were in position, I placed in the retractor and secured it onto the table, locked into position and confirmed under A/P and lateral fluoroscopy to confirm our approach angle to the disc space as well as location relative to the disc space.  I then placed the muscle stimulator in through the working channel down to the vertebral body, stimulating the entire lateral surface of the vertebral body and any of the visualized psoas muscle that was adjacent to the retractor, confirming again the safe passage to the psoas before we began performing the discectomy.  At this point, we began our discectomy at L4/5.  The disc was incised laterally throughout the extent of our exposure. Using a combination of pituitary rongeurs, Kerrison rongeurs, rasps, curettes of various sorts, we were able to begin to clean out the disc space.  Once we had cleaned out the majority of the disc space, we then cut the lateral annulus with a cob, breaking the lateral annual attachments on the contralateral side by subtly working the cob through the annulus while using flouroscopy.  Care was taken not to extend further than required after cutting the annular attachments.  After this had been performed, we prepared the endplates for placement of our graft, sized a graft to the disc space by serially dilating up in trial sizes until we confirmed that our graft would be well positioned, allowing distraction while maintaining good grip.  This was confirmed under A/P and lateral fluoroscopy in order to ensure its placement as an eventual trial for placement of our final  graft.  We irrigated with saline.  Once confirmed placement, the Hedron implant filled with allograft was impacted into position at L4/5.   Through a combination of intradiscal distraction and anterior releasing, we were able to correct the anterior deformity during disc preparation and placement of the graft.  At this point, final radiographs were performed, and we began closure.  The wound was closed using 0 Vicryl interrupted suture in the fascia and 2-0 Vicryl inverted suture were placed in the subcutaneous tissue and dermis. 3-0 monocryl was used for final closure. Dermabond was used to close the skin.    After closing the anterior part in layers, the patient was repositioned into prone position.  All pressure points were checked and double-checked.  The posterior operative site was prepped and draped in standard fashion.  The stereotactic array was placed.  Stereotactic images were acquired using intraoperative CT scanning.  This was registered to the patient.  Using stereotaxis, screw trajectories were planned and incisions made.  The pedicles from L4 to L5 were cannulated bilaterally and K wires used to secure the tracks.  We then utilized a stereotactic screwdriver to place pedicle screws from L4 to L5.  At each level, Nuvasive Reline pedicle screws were placed.  Once the screws were placed, the screw extensions were then linked, a path was formed for the rod and a rod was utilized to connect the screws.  We then compressed, torqued / counter-torqued and removed the screw assembly. Once performed on each  side, the C-arm was brought back and to take confirmatory CT scan showing appropriate placement of all instrumentation and anatomic alignment.    Posterolateral arthrodesis was performed at L4-L5 utilizing demineralized bone matrix.  We irrigated each incision and obtained hemostasis. Each wound was closed using 0 Vicryl interrupted suture in the fascia, 2-0 Vicryl inverted suture were  placed in the subcutaneous tissue and dermis. 3-0 monocryl was used for final closure. Dermabond was used to close the skin.    Needle, lap and all counts were correct at the end of the case.     Edsel Goods PA assisted in the entire procedure. An assistant was required for this procedure due to the complexity.  The assistant provided assistance in tissue manipulation and suction, and was required for the successful and safe performance of the procedure. I performed the critical portions of the procedure.   [name] Neurosurgery

## 2024-07-13 NOTE — Plan of Care (Signed)
 Patient verbalizes understanding of procedure, plan of care discharge instructions

## 2024-07-13 NOTE — Plan of Care (Signed)
  Problem: Activity: Goal: Ability to avoid complications of mobility impairment will improve Outcome: Progressing   Problem: Pain Management: Goal: Pain level will decrease Outcome: Progressing

## 2024-07-14 ENCOUNTER — Other Ambulatory Visit: Payer: Self-pay

## 2024-07-14 ENCOUNTER — Encounter: Payer: Self-pay | Admitting: Neurosurgery

## 2024-07-14 DIAGNOSIS — M532X6 Spinal instabilities, lumbar region: Secondary | ICD-10-CM | POA: Diagnosis not present

## 2024-07-14 DIAGNOSIS — M4316 Spondylolisthesis, lumbar region: Secondary | ICD-10-CM | POA: Diagnosis not present

## 2024-07-14 MED ORDER — SENNA 8.6 MG PO TABS: 1.0000 | ORAL_TABLET | Freq: Two times a day (BID) | ORAL | 0 refills | Status: DC | PRN

## 2024-07-14 MED ORDER — SENNA 8.6 MG PO TABS
1.0000 | ORAL_TABLET | Freq: Two times a day (BID) | ORAL | 0 refills | Status: DC | PRN
  Filled 2024-07-14: qty 30, 15d supply, fill #0

## 2024-07-14 MED ORDER — CYCLOBENZAPRINE HCL 10 MG PO TABS
10.0000 mg | ORAL_TABLET | Freq: Three times a day (TID) | ORAL | 0 refills | Status: AC | PRN
  Filled 2024-07-14: qty 90, 30d supply, fill #0

## 2024-07-14 MED ORDER — METHYLPREDNISOLONE 4 MG PO TBPK
ORAL_TABLET | ORAL | 0 refills | Status: DC
  Filled 2024-07-14: qty 21, 6d supply, fill #0

## 2024-07-14 MED ORDER — OXYCODONE HCL 5 MG PO TABS: 5.0000 mg | ORAL_TABLET | ORAL | 0 refills | Status: DC | PRN

## 2024-07-14 MED ORDER — DEXAMETHASONE SODIUM PHOSPHATE 10 MG/ML IJ SOLN
4.0000 mg | Freq: Once | INTRAMUSCULAR | Status: AC
  Administered 2024-07-14: 4 mg via INTRAVENOUS

## 2024-07-14 MED ORDER — OXYCODONE HCL 5 MG PO TABS
5.0000 mg | ORAL_TABLET | ORAL | 0 refills | Status: AC | PRN
  Filled 2024-07-14: qty 30, 5d supply, fill #0

## 2024-07-14 MED ORDER — DEXAMETHASONE SODIUM PHOSPHATE 10 MG/ML IJ SOLN
INTRAMUSCULAR | Status: AC
  Filled 2024-07-14: qty 1

## 2024-07-14 MED ORDER — CYCLOBENZAPRINE HCL 10 MG PO TABS: 10.0000 mg | ORAL_TABLET | Freq: Three times a day (TID) | ORAL | 0 refills | Status: DC | PRN

## 2024-07-14 NOTE — Evaluation (Signed)
 Physical Therapy Evaluation Patient Details Name: Kim Watson MRN: [id number] DOB: [birth year] Today's Date: 07/14/2024  History of Present Illness  53 y/o female s/p PLIF L4/5 on 07/13/24. PMH: HTN, OSA, hx of ACDF, anxiety, depression  Clinical Impression  Patient admitted following above procedure. PTA, patient lives with husband and was independent, working, and driving. Educated patient on back precautions with good follow through during session with no cueing needed. Patient functioning at supervision level for OOB mobility with no AD for safety, but no physical assistance required. Encouraged progressive walking program at discharge to improve general BLE strength. No further skilled PT needs identified acutely. PT will sign off at this time. Please re-consult if needs arise.         If plan is discharge home, recommend the following: Assist for transportation;Help with stairs or ramp for entrance;Assistance with cooking/housework   Can travel by private vehicle        Equipment Recommendations BSC/3in1  Recommendations for Other Services       Functional Status Assessment Patient has had a recent decline in their functional status and demonstrates the ability to make significant improvements in function in a reasonable and predictable amount of time.     Precautions / Restrictions Precautions Precautions: Back Precaution Booklet Issued: Yes (comment) Recall of Precautions/Restrictions: Intact Restrictions Weight Bearing Restrictions Per Provider Order: No      Mobility  Bed Mobility Overal bed mobility: Modified Independent             General bed mobility comments: use of bed rails but able to complete log roll technique modI    Transfers Overall transfer level: Modified independent Equipment used: None                    Ambulation/Gait Ambulation/Gait assistance: Supervision Gait Distance (Feet): 200 Feet Assistive device: None Gait  Pattern/deviations: WFL(Within Functional Limits)   Gait velocity interpretation: >2.62 ft/sec, indicative of community ambulatory      Stairs Stairs: Yes Stairs assistance: Supervision Stair Management: Two rails, Step to pattern, Forwards Number of Stairs: 4 General stair comments: leading with L LE and descending with R LE  Wheelchair Mobility     Tilt Bed    Modified Rankin (Stroke Patients Only)       Balance Overall balance assessment: No apparent balance deficits (not formally assessed)                                           Pertinent Vitals/Pain Pain Assessment Pain Assessment: Faces Faces Pain Scale: Hurts even more Pain Location: back and R LE Pain Descriptors / Indicators: Grimacing Pain Intervention(s): Limited activity within patient's tolerance, Monitored during session, Repositioned    Home Living Family/patient expects to be discharged to:: Private residence Living Arrangements: Spouse/significant other Available Help at Discharge: Family;Available 24 hours/day Type of Home: House Home Access: Stairs to enter Entrance Stairs-Rails: Right;Left;Can reach both Entrance Stairs-Number of Steps: 4   Home Layout: One level Home Equipment: None      Prior Function Prior Level of Function : Independent/Modified Independent;Working/employed;Driving                     Extremity/Trunk Assessment   Upper Extremity Assessment Upper Extremity Assessment: Overall WFL for tasks assessed    Lower Extremity Assessment Lower Extremity Assessment: Generalized weakness    Cervical / Trunk  Assessment Cervical / Trunk Assessment: Back Surgery  Communication   Communication Communication: No apparent difficulties    Cognition Arousal: Alert Behavior During Therapy: WFL for tasks assessed/performed   PT - Cognitive impairments: No apparent impairments                         Following commands: Intact        Cueing       General Comments      Exercises     Assessment/Plan    PT Assessment Patient does not need any further PT services  PT Problem List         PT Treatment Interventions      PT Goals (Current goals can be found in the Care Plan section)  Acute Rehab PT Goals Patient Stated Goal: to go home PT Goal Formulation: With patient    Frequency       Co-evaluation               AM-PAC PT 6 Clicks Mobility  Outcome Measure Help needed turning from your back to your side while in a flat bed without using bedrails?: None Help needed moving from lying on your back to sitting on the side of a flat bed without using bedrails?: None Help needed moving to and from a bed to a chair (including a wheelchair)?: None Help needed standing up from a chair using your arms (e.g., wheelchair or bedside chair)?: None Help needed to walk in hospital room?: A Little Help needed climbing 3-5 steps with a railing? : A Little 6 Click Score: 22    End of Session   Activity Tolerance: Patient tolerated treatment well Patient left: in chair;with call bell/phone within reach Nurse Communication: Mobility status;Other (comment) (Equipment needs) PT Visit Diagnosis: Muscle weakness (generalized) (M62.81)    Time: 9158-9144 PT Time Calculation (min) (ACUTE ONLY): 14 min   Charges:   PT Evaluation $PT Eval Moderate Complexity: 1 Mod   PT General Charges $$ ACUTE PT VISIT: 1 Visit         Kim Watson, PT, DPT Physical Therapist - [facility]  [facility]   Kim Watson 07/14/2024, 9:01 AM

## 2024-07-14 NOTE — Discharge Summary (Signed)
 Discharge Summary  Patient ID: Kim Watson MRN: [id number] DOB/AGE: Mar 06, 1971 53 y.o.  Admit date: 07/13/2024 Discharge date: 07/14/2024  Admission Diagnoses: M43.16 Spondylolisthesis of lumbar region, M54.41, G89.29 Chronic bilateral low back pain with right-sided sciatica, M53.2X2 Spinal instability, lumbar   Discharge Diagnoses:  Principal Problem:   S/P lumbar fusion Active Problems:   Spinal instability, lumbar   Spondylolisthesis of lumbar region   Discharged Condition: good  Hospital Course:  Kim Watson is a 53 y.o presenting with lumbar spondylolisthesis and right sciatica s/p L4-5 XLIF and PSF. Her intraoperative course was uncomplicated. She was admitted overnight for pain control and therapy evaluation. She was deemed appropriate for discharge home on POD1. Her pain was well controlled largely on tylenol  and non opioid medications. She did experience some right groin pain with hip flexion with some associated weakness likely due to psoas irritation. She was given prescriptions for Oxycodone , Flexeril , Senna and a MDP at discharge.   Consults: None  Significant Diagnostic Studies: NA  Treatments: surgery: as above. Please see separately dictated operative report for further details.   Discharge Exam: Blood pressure 127/77, pulse 72, temperature 98.1 F (36.7 C), temperature source Oral, resp. rate 17, height 5' 6 (1.676 m), weight 119.7 kg, SpO2 95%. AA Ox3 CNI   Strength:5/5 throughout xcept 3/5 right HF and KE   Incisions: with post-op dressings in place  Disposition: Discharge disposition: 01-Home or Self Care       Discharge Instructions     Incentive spirometry RT   Complete by: As directed    Remove dressing in 48 hours   Complete by: As directed       Allergies as of 07/14/2024       Reactions   Nsaids Other (See Comments)   History of gastric bypass   Zestril [lisinopril] Cough           Medication List     TAKE these  medications    clonazePAM  0.5 MG disintegrating tablet Commonly known as: KLONOPIN  DISSOLVE 1 TABLET(0.5 MG) ON THE TONGUE TWICE DAILY What changed: See the new instructions.   cloNIDine  0.1 mg/24hr patch Commonly known as: CATAPRES  - Dosed in mg/24 hr Place 1 patch (0.1 mg total) onto the skin once a week.   clotrimazole -betamethasone  cream Commonly known as: LOTRISONE  Apply topically 2 (two) times daily. What changed:  how much to take when to take this reasons to take this   cyclobenzaprine  10 MG tablet Commonly known as: FLEXERIL  Take 1 tablet (10 mg total) by mouth 3 (three) times daily as needed for muscle spasms.   DULoxetine  60 MG capsule Commonly known as: CYMBALTA  Take 1 capsule (60 mg total) by mouth daily.   fluticasone  50 MCG/ACT nasal spray Commonly known as: FLONASE  Place 2 sprays into both nostrils daily.   methylPREDNISolone  4 MG Tbpk tablet Commonly known as: MEDROL  DOSEPAK Take as directed on box   nystatin  powder Commonly known as: nystatin  Apply 1 Application topically 3 (three) times daily. As needed   oxyCODONE  5 MG immediate release tablet Commonly known as: Oxy IR/ROXICODONE  Take 1 tablet (5 mg total) by mouth every 4 (four) hours as needed for up to 5 days for moderate pain (pain score 4-6).   pantoprazole  40 MG tablet Commonly known as: PROTONIX  TAKE 1 TABLET(40 MG) BY MOUTH DAILY   senna 8.6 MG Tabs tablet Commonly known as: SENOKOT Take 1 tablet (8.6 mg total) by mouth 2 (two) times daily as needed for mild constipation.  valACYclovir  500 MG tablet Commonly known as: VALTREX  TAKE 1 TABLET(500 MG) BY MOUTH TWICE DAILY FOR 3 DAYS AS NEEDED FOR FLARE UP   Wegovy  1.7 MG/0.75ML Soaj SQ injection Generic drug: semaglutide -weight management Inject 1.7 mg into the skin once a week.               Durable Medical Equipment  (From admission, onward)           Start     Ordered   07/14/24 0946  For home use only DME Bedside  commode  Once       Question:  Patient needs a bedside commode to treat with the following condition  Answer:  S/P lumbar fusion   07/14/24 0945             Signed: Edsel Jama Goods 07/14/2024, 10:20 AM

## 2024-07-14 NOTE — Evaluation (Signed)
 Occupational Therapy Evaluation Patient Details Name: Kim Watson MRN: [id number] DOB: 06-06-1971 Today's Date: 07/14/2024   History of Present Illness   53 y/o female s/p PLIF L4/5 on 07/13/24. PMH: HTN, OSA, hx of ACDF, anxiety, depression     Clinical Impressions Pt seen for OT evaluation this date, POD#1 from lumbar fusion L4/5. Prior to hospital admission, pt was independent with mobility, ADL, and IADL. No falls in past 12 months. However, has been having increased difficulty with all aspects of mobility and ADL due to back pain. Pt lives with spouse in a single family home with 4 step to enter and handrail available with spouse/family able to provide 24/7 assist/support as needed for pt. Currently pt is at supervision to Mod I level with all aspects of mobility and Min assist for LB ADL tasks. Pt educated in back precautions (handout was already provided), self care skills, and functional transfer training, AE/DME for bathing (long handled sponge), dressing (reacher), and toileting needs, and home/routines modifications and falls prevention strategies to maximize safety and functional independence while minimizing falls risk and maintaining precautions. Pt verbalized understanding of all education/training provided.  No additional skilled OT needs at this time. Will discharge in house. Upon hospital discharge, pt safe to discharge home.       If plan is discharge home, recommend the following:   Assistance with cooking/housework     Functional Status Assessment   Patient has had a recent decline in their functional status and demonstrates the ability to make significant improvements in function in a reasonable and predictable amount of time.     Equipment Recommendations   BSC/3in1     Recommendations for Other Services         Precautions/Restrictions   Precautions Precautions: Back Recall of Precautions/Restrictions: Intact Restrictions Weight Bearing  Restrictions Per Provider Order: No     Mobility Bed Mobility               General bed mobility comments: Sitting in recliner chair at start of session.    Transfers Overall transfer level: Modified independent Equipment used: None                      Balance Overall balance assessment: No apparent balance deficits (not formally assessed)                                         ADL either performed or assessed with clinical judgement   ADL Overall ADL's : Needs assistance/impaired             Lower Body Bathing: Sit to/from stand;Cueing for back precautions;Cueing for compensatory techniques;Minimal assistance Lower Body Bathing Details (indicate cue type and reason): Simulated - education on LH sponge     Lower Body Dressing: Sit to/from stand;Cueing for sequencing;Cueing for compensatory techniques;Cueing for back precautions Lower Body Dressing Details (indicate cue type and reason): with application of AE (reacher) Toilet Transfer: Contact guard assist Toilet Transfer Details (indicate cue type and reason): Pt reliant on arm rails/toilet rails for toilet transfer simulation Toileting- Clothing Manipulation and Hygiene: Modified independent       Functional mobility during ADLs: Supervision/safety General ADL Comments: Due to back precautions would benefit from application of AE for LB ADLs and 3:1 over commode for toilet height adjustment and application of arm rails     Vision Baseline Vision/History: 1  Wears glasses       Perception         Praxis         Pertinent Vitals/Pain Pain Assessment Pain Assessment: 0-10 Pain Score: 7  Pain Location: back and R LE Pain Descriptors / Indicators: Grimacing Pain Intervention(s): Limited activity within patient's tolerance, Monitored during session, Repositioned     Extremity/Trunk Assessment Upper Extremity Assessment Upper Extremity Assessment: Generalized weakness    Lower Extremity Assessment Lower Extremity Assessment: Defer to PT evaluation   Cervical / Trunk Assessment Cervical / Trunk Assessment: Back Surgery   Communication Communication Communication: No apparent difficulties   Cognition Arousal: Alert Behavior During Therapy: WFL for tasks assessed/performed Cognition: No apparent impairments                               Following commands: Intact       Cueing  General Comments   Cueing Techniques: Verbal cues;Gestural cues      Exercises Other Exercises Other Exercises: Education on role of OT Other Exercises: Education on application of AE (reacher and LH sponge for LB bathing/dressing) Other Exercises: Benefits of and application of 3:1 commode over toilet Other Exercises: Due to back precautions would benefit from application of AE for LB ADLs and 3:1 over commode for toilet height adjustment and application of arm rails.   Shoulder Instructions      Home Living Family/patient expects to be discharged to:: Private residence Living Arrangements: Spouse/significant other Available Help at Discharge: Family;Available 24 hours/day Type of Home: House Home Access: Stairs to enter [employer] of Steps: 4 Entrance Stairs-Rails: Right;Left;Can reach both Home Layout: One level     Bathroom Shower/Tub: Producer, television/film/video: Standard     Home Equipment: None          Prior Functioning/Environment Prior Level of Function : Independent/Modified Independent;Working/employed;Driving                    OT Problem List: Decreased knowledge of use of DME or AE;Decreased knowledge of precautions   OT Treatment/Interventions:        OT Goals(Current goals can be found in the care plan section)   Acute Rehab OT Goals Patient Stated Goal: Return home OT Goal Formulation: With patient Time For Goal Achievement: 07/28/24 Potential to Achieve Goals: Good ADL Goals Pt Will  Perform Lower Body Bathing: with modified independence;sit to/from stand Pt Will Perform Lower Body Dressing: with modified independence;sit to/from stand Pt Will Transfer to Toilet: with modified independence;bedside commode   OT Frequency:       Co-evaluation              AM-PAC OT 6 Clicks Daily Activity     Outcome Measure Help from another person eating meals?: None   Help from another person toileting, which includes using toliet, bedpan, or urinal?: A Little Help from another person bathing (including washing, rinsing, drying)?: A Little Help from another person to put on and taking off regular upper body clothing?: None Help from another person to put on and taking off regular lower body clothing?: A Little 6 Click Score: 17   End of Session Nurse Communication: Mobility status  Activity Tolerance: Patient tolerated treatment well;No increased pain Patient left: in chair;with call bell/phone within reach  OT Visit Diagnosis: Muscle weakness (generalized) (M62.81)  Time: 9143-9087 OT Time Calculation (min): 16 min Charges:  OT General Charges $OT Visit: 1 Visit OT Evaluation $OT Eval Low Complexity: 1 Low OT Treatments $Self Care/Home Management : 8-22 mins  Harlene Sharps OTR/L   Harlene LITTIE Sharps 07/14/2024, 9:32 AM

## 2024-07-14 NOTE — Progress Notes (Addendum)
 Patient is not able to walk the distance required to go the bathroom, or he/she is unable to safely negotiate stairs required to access the bathroom.  A 3in1 BSC will alleviate this problem

## 2024-07-14 NOTE — Plan of Care (Signed)
  Problem: Education: Goal: Knowledge of General Education information will improve Description: Including pain rating scale, medication(s)/side effects and non-pharmacologic comfort measures Outcome: Progressing   Problem: Education: Goal: Ability to verbalize activity precautions or restrictions will improve Outcome: Progressing   Problem: Bowel/Gastric: Goal: Gastrointestinal status for postoperative course will improve Outcome: Progressing   Problem: Pain Management: Goal: Pain level will decrease Outcome: Progressing

## 2024-07-14 NOTE — Progress Notes (Signed)
 DISCHARGE NOTE:   Pt dc with IV removed and dc instructions given. Pt received a 3 in 1 as well as medications delivered to hospital room. Pt voices no question or concerns at this time. Pt wheeled down to medical mall entrance by staff and pt's husband provided transportation.

## 2024-07-14 NOTE — Progress Notes (Signed)
   Neurosurgery Progress Note  History: Kim Watson is s/p L4-5 XLIF and PSF  POD1: Pt doing well with some right groin pain  Physical Exam: Vitals:   07/14/24 0347 07/14/24 0725  BP: 129/76 127/77  Pulse: 80 72  Resp: 16 17  Temp: (!) 97.4 F (36.3 C) 98.1 F (36.7 C)  SpO2: 96% 95%    AA Ox3 CNI  Strength:5/5 throughout xcept 3/5 right HF and KE  Incisions: with post-op dressings in place  Data:  Other tests/results: NA  Assessment/Plan:  Kim Watson is a 53 y.o presenting with lumbar spondylolisthesis and right sided sciatica s/p  s/p L4-5 XLIF and PSF  - mobilize - pain control - DVT prophylaxis - will start some steroids for psoas irritation - PTOT; dispo pending recommendations  Edsel Goods PA-C Department of Neurosurgery

## 2024-07-20 NOTE — Telephone Encounter (Signed)
 She is s/p L4-5 XLIF and PSF on 07/13/24.   Was discharged on medrol  dose pack due to right leg/groin pain likely from lateral exposure.

## 2024-07-25 ENCOUNTER — Encounter: Payer: Self-pay | Admitting: Physician Assistant

## 2024-07-25 ENCOUNTER — Ambulatory Visit (INDEPENDENT_AMBULATORY_CARE_PROVIDER_SITE_OTHER): Admitting: Physician Assistant

## 2024-07-25 VITALS — BP 118/84 | Temp 98.3°F | Ht 66.0 in | Wt 263.0 lb

## 2024-07-25 DIAGNOSIS — M5441 Lumbago with sciatica, right side: Secondary | ICD-10-CM

## 2024-07-25 DIAGNOSIS — Z09 Encounter for follow-up examination after completed treatment for conditions other than malignant neoplasm: Secondary | ICD-10-CM

## 2024-07-25 DIAGNOSIS — M4316 Spondylolisthesis, lumbar region: Secondary | ICD-10-CM

## 2024-07-25 DIAGNOSIS — G8929 Other chronic pain: Secondary | ICD-10-CM

## 2024-07-25 MED ORDER — GABAPENTIN 300 MG PO CAPS: 600.0000 mg | ORAL_CAPSULE | Freq: Three times a day (TID) | ORAL | 2 refills | Status: AC

## 2024-07-25 MED ORDER — METHYLPREDNISOLONE 4 MG PO TBPK: ORAL_TABLET | ORAL | 0 refills | Status: DC

## 2024-07-25 NOTE — Progress Notes (Signed)
   REFERRING PHYSICIAN:  Nedra Tinnie [facility]OR, [address] [place],  [place] [address]  DOS: 07/13/24  HISTORY OF PRESENT ILLNESS: Kim Watson is approximately 2 weeks status post L4-5 XLIF and PSF. she is doing okay at this point in time.  She comes in early for her postop appointment secondary to extreme, burning and stabbing pain on the inside of her right thigh.  She states that she was given a steroid while she was in the hospital which helped her pain some however it has now come back and is severe in nature.  She denies any new weakness with the exception of her hip flexion weakness which was present after surgery.  She started taking old gabapentin , 3 days ago which has not helped her nerve pain at this point.   PHYSICAL EXAMINATION:  General: Patient is well developed, well nourished, calm, collected, and in no apparent distress.   NEUROLOGICAL:  General: In no acute distress.   Awake, alert, oriented to person, place, and time.  Pupils equal round and reactive to light.  Facial tone is symmetric.     Strength:  3/5 HF and 4 in knee extension-improved when she was inpatient. 5/5 in adduction. Otherwise 5/5 in BLE  Significant decrease in  sensation on the right medial thigh compared to contralateral side.    ,         Incisions c/d/I, continue to heal. Medihoney placed on lateral incision   ROS (Neurologic):  Negative except as noted above  IMAGING: No new imaging  ASSESSMENT/PLAN:  Kim Watson is approximately 2 weeks after L4-5 XLIF and PSF.  She has had new, increased burning pain over the medial aspect of her right thigh since waking up from surgery.  This is relieved some when she was placed on steroids however the pain has returned.  It is severe, stabbing, burning in nature.  She was restarted on gabapentin  prescription but is not that helped her pain.  Patient likely has a genitofemoral neuropathy.  Plan for repeat Medrol  Dosepak and increasing  gabapentin  dosage at this time.  Will continue to follow closely.  I have advised the patient to lift up to 10 pounds until 6 weeks after surgery, then increase up to 25 pounds until 12 weeks after surgery.  After 12 weeks post-op, the patient advised to increase activity as tolerated.  Advised to contact the office if any questions or concerns arise.  Lyle Decamp PA-C Department of neurosurgery

## 2024-07-27 NOTE — Anesthesia Postprocedure Evaluation (Signed)
 Anesthesia Post Note  Patient: Kim Watson  Procedure(s) Performed: L4-5 LATERAL LUMBAR INTERBODY FUSION AND POSTERIOR SPINAL FUSION (Spine Lumbar) APPLICATION OF INTRAOPERATIVE CT SCAN (Spine Lumbar)  Patient location during evaluation: PACU Anesthesia Type: General Level of consciousness: awake and alert Pain management: pain level controlled Vital Signs Assessment: post-procedure vital signs reviewed and stable Respiratory status: spontaneous breathing, nonlabored ventilation, respiratory function stable and patient connected to nasal cannula oxygen Cardiovascular status: blood pressure returned to baseline and stable Postop Assessment: no apparent nausea or vomiting Anesthetic complications: no   No notable events documented.   Last Vitals:  Vitals:   07/14/24 0725 07/14/24 1037  BP: 127/77 (!) 142/85  Pulse: 72 71  Resp: 17 17  Temp: 36.7 C 36.9 C  SpO2: 95% 97%    Last Pain:  Vitals:   07/14/24 1037  TempSrc: Oral  PainSc:                  Lynwood KANDICE Clause

## 2024-07-28 ENCOUNTER — Encounter: Admitting: Neurosurgery

## 2024-08-02 NOTE — Telephone Encounter (Signed)
 Left patient a message.

## 2024-08-05 ENCOUNTER — Other Ambulatory Visit: Payer: Self-pay

## 2024-08-05 DIAGNOSIS — G8929 Other chronic pain: Secondary | ICD-10-CM

## 2024-08-05 DIAGNOSIS — M4316 Spondylolisthesis, lumbar region: Secondary | ICD-10-CM

## 2024-08-08 ENCOUNTER — Ambulatory Visit (INDEPENDENT_AMBULATORY_CARE_PROVIDER_SITE_OTHER): Admitting: Physician Assistant

## 2024-08-08 ENCOUNTER — Ambulatory Visit
Admission: RE | Admit: 2024-08-08 | Discharge: 2024-08-08 | Disposition: A | Discharge status: Home / Self Care | Source: Ambulatory Visit | Attending: Physician Assistant | Admitting: Physician Assistant

## 2024-08-08 ENCOUNTER — Encounter: Payer: Self-pay | Admitting: Physician Assistant

## 2024-08-08 ENCOUNTER — Ambulatory Visit
Admission: RE | Admit: 2024-08-08 | Discharge: 2024-08-08 | Disposition: A | Discharge status: Home / Self Care | Attending: Physician Assistant | Admitting: Physician Assistant

## 2024-08-08 VITALS — BP 124/62 | Temp 98.3°F | Ht 66.0 in | Wt 263.0 lb

## 2024-08-08 DIAGNOSIS — G8929 Other chronic pain: Secondary | ICD-10-CM | POA: Insufficient documentation

## 2024-08-08 DIAGNOSIS — M4316 Spondylolisthesis, lumbar region: Secondary | ICD-10-CM

## 2024-08-08 DIAGNOSIS — M5441 Lumbago with sciatica, right side: Secondary | ICD-10-CM | POA: Diagnosis not present

## 2024-08-08 DIAGNOSIS — Z9889 Other specified postprocedural states: Secondary | ICD-10-CM

## 2024-08-08 NOTE — Progress Notes (Signed)
   REFERRING PHYSICIAN:  Nedra Tinnie [facility], [address] [place],  [place] [address]  DOS: 07/13/24  HISTORY OF PRESENT ILLNESS: Kim Watson is approximately 4 weeks status post L4-5 XLIF and PSF.  She is doing extremely well.  The inner thigh pain on her right side is almost completely gone.  She does have some residual numbness, but the severe neuropathic pain is no longer present.  Her back is also doing well.  No pain radiating down her legs.   PHYSICAL EXAMINATION:  General: Patient is well developed, well nourished, calm, collected, and in no apparent distress.   NEUROLOGICAL:  General: In no acute distress.   Awake, alert, oriented to person, place, and time.  Pupils equal round and reactive to light.  Facial tone is symmetric.     Strength:  Hip flexion weakness resolved.  Full send bilateral lower extremities.  Continues to have some decrease in sensation in the medial aspect of the left thigh.    ,         Incisions c/d/I, continue to heal on distal aspect of left posterior lumbar incision  ROS (Neurologic):  Negative except as noted above  IMAGING: No new imaging  ASSESSMENT/PLAN:  Kim Watson is approximately 4 weeks after L4-5 XLIF and PSF.  Genitofemoral neuropathy has improved significantly.  She is doing extremely well.    I have advised the patient to lift up to 10 pounds until 6 weeks after surgery, then increase up to 25 pounds until 12 weeks after surgery.  After 12 weeks post-op, the patient advised to increase activity as tolerated.  Advised to contact the office if any questions or concerns arise.  Lyle Decamp PA-C Department of neurosurgery

## 2024-08-09 ENCOUNTER — Other Ambulatory Visit: Payer: Self-pay | Admitting: Family

## 2024-08-10 ENCOUNTER — Other Ambulatory Visit: Payer: Self-pay | Admitting: Nurse Practitioner

## 2024-08-11 NOTE — Telephone Encounter (Signed)
 Requesting: CLOTRIMAZOLE -BETAMETHASONE  CRM 45GM  Last Visit: 05/10/2024 Next Visit: 11/08/2024 Last Refill: 04/06/2024  Please Advise

## 2024-08-15 ENCOUNTER — Encounter: Payer: Self-pay | Admitting: Nurse Practitioner

## 2024-08-15 MED ORDER — CLONAZEPAM 0.5 MG PO TBDP: 0.5000 mg | ORAL_TABLET | Freq: Two times a day (BID) | ORAL | 1 refills | Status: AC | PRN

## 2024-08-15 NOTE — Telephone Encounter (Signed)
 Requesting: clonazePAM  (KLONOPIN ) 0.5 MG disintegrating tablet  Last Visit: 05/10/2024 Next Visit: 11/08/2024 Last Refill: 12/23/2023  Please Advise

## 2024-08-15 NOTE — Addendum Note (Signed)
 Addended by: GLADIS CLAUDENE GRATE Y on: 08/15/2024 03:01 PM   Modules accepted: Orders

## 2024-08-22 ENCOUNTER — Other Ambulatory Visit: Payer: Self-pay | Admitting: Nurse Practitioner

## 2024-08-23 NOTE — Telephone Encounter (Signed)
 Requesting: CLONIDINE  0.1MG /24H WEEKLY PATCH  Last Visit: 05/10/2024 Next Visit: 11/08/2024 Last Refill: 05/10/2024  Please Advise

## 2024-08-25 ENCOUNTER — Ambulatory Visit
Admission: RE | Admit: 2024-08-25 | Discharge: 2024-08-25 | Disposition: A | Discharge status: Home / Self Care | Source: Ambulatory Visit | Attending: Neurosurgery | Admitting: Neurosurgery

## 2024-08-25 ENCOUNTER — Other Ambulatory Visit: Payer: Self-pay

## 2024-08-25 ENCOUNTER — Encounter: Payer: Self-pay | Admitting: Neurosurgery

## 2024-08-25 ENCOUNTER — Ambulatory Visit (INDEPENDENT_AMBULATORY_CARE_PROVIDER_SITE_OTHER): Admitting: Neurosurgery

## 2024-08-25 VITALS — BP 118/76 | Temp 98.7°F | Ht 66.0 in | Wt 264.0 lb

## 2024-08-25 DIAGNOSIS — M4316 Spondylolisthesis, lumbar region: Secondary | ICD-10-CM | POA: Diagnosis not present

## 2024-08-25 DIAGNOSIS — Z09 Encounter for follow-up examination after completed treatment for conditions other than malignant neoplasm: Secondary | ICD-10-CM

## 2024-08-25 NOTE — Progress Notes (Signed)
   REFERRING PHYSICIAN:  Nedra Tinnie [facility], [address] [place],  [place] [address]  DOS: 07/13/24  HISTORY OF PRESENT ILLNESS: Kim Watson is status post L4-5 XLIF and PSF.  She is doing extremely well.  She some numbness on her thigh but that is improving.  Overall, she is doing extremely well.      PHYSICAL EXAMINATION:  General: Patient is well developed, well nourished, calm, collected, and in no apparent distress.   NEUROLOGICAL:  General: In no acute distress.   Awake, alert, oriented to person, place, and time.  Pupils equal round and reactive to light.  Facial tone is symmetric.     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    Her tingling on her right leg has improved greatly.  I expect that we will continue to improve.  I am very pleased with her improvements.  We will see her back in 6 weeks.  We reviewed her activity limitations.   [name] Department of neurosurgery

## 2024-09-12 ENCOUNTER — Encounter: Payer: Self-pay | Admitting: Nurse Practitioner

## 2024-09-12 ENCOUNTER — Ambulatory Visit: Admitting: Nurse Practitioner

## 2024-09-12 ENCOUNTER — Ambulatory Visit: Payer: Self-pay | Admitting: Nurse Practitioner

## 2024-09-12 ENCOUNTER — Ambulatory Visit (INDEPENDENT_AMBULATORY_CARE_PROVIDER_SITE_OTHER)

## 2024-09-12 VITALS — BP 128/80 | HR 84 | Temp 98.5°F | Ht 66.0 in | Wt 258.0 lb

## 2024-09-12 DIAGNOSIS — Z01818 Encounter for other preprocedural examination: Secondary | ICD-10-CM

## 2024-09-12 DIAGNOSIS — R21 Rash and other nonspecific skin eruption: Secondary | ICD-10-CM | POA: Insufficient documentation

## 2024-09-12 DIAGNOSIS — K5909 Other constipation: Secondary | ICD-10-CM | POA: Insufficient documentation

## 2024-09-12 DIAGNOSIS — J3089 Other allergic rhinitis: Secondary | ICD-10-CM | POA: Diagnosis not present

## 2024-09-12 DIAGNOSIS — G4733 Obstructive sleep apnea (adult) (pediatric): Secondary | ICD-10-CM | POA: Diagnosis not present

## 2024-09-12 MED ORDER — VALACYCLOVIR HCL 500 MG PO TABS: ORAL_TABLET | ORAL | 1 refills | Status: AC

## 2024-09-12 MED ORDER — FLUTICASONE PROPIONATE 50 MCG/ACT NA SUSP
2.0000 | Freq: Every day | NASAL | 6 refills | Status: AC
Start: 2024-09-12 — End: ?

## 2024-09-12 NOTE — Assessment & Plan Note (Signed)
 Chronic constipation may be related to medication side effects, and current treatment with Colace is insufficient. Recommend Miralax  once daily as needed for constipation relief. Encourage fluids.

## 2024-09-12 NOTE — Patient Instructions (Signed)
 It was great to see you!  We are checking your labs today and will let you know the results via mychart/phone.   We are updating your chest xray and EKG   I have ordered valtrex  2 tablets twice a day for 7 days, then 1 tablet daily for at least 30 days   I have placed a referral to sleep medicine   Take care,  Tinnie Harada, NP

## 2024-09-12 NOTE — Assessment & Plan Note (Signed)
 Suspected obstructive sleep apnea is indicated by symptoms of gasping for breath during sleep and panic attacks. The last sleep study was in 2015. Referral placed to sleep medicine.

## 2024-09-12 NOTE — Assessment & Plan Note (Signed)
 Flonase  refill sent to the pharmacy.

## 2024-09-12 NOTE — Assessment & Plan Note (Signed)
 BMI 41.6. Obesity persists with a weight plateau at 250-260 lbs. Medicaid no longer covers Wegovy , and surgical options for further weight loss are under consideration. Order a chest x-ray and EKG for preoperative evaluation, and check ferritin levels. Discuss the potential for a sleep study to explore other weight loss medication options.

## 2024-09-12 NOTE — Assessment & Plan Note (Signed)
 Recurrent herpes zoster vs simplex presents with a painful rash exacerbated post-surgery. Start Valtrex  1000 mg twice daily for 7 days, then 500 mg daily for at least a month. Advise avoiding popping lesions to prevent infection.

## 2024-09-12 NOTE — Progress Notes (Signed)
 Acute Office Visit  Subjective:     Patient ID: Kim Watson, female    DOB: [birth year], 53 y.o.   MRN: [id number]  Chief Complaint  Patient presents with   Rash    Reoccurring rash on back with pain    HPI Discussed the use of AI scribe software for clinical note transcription with the patient, who gave verbal consent to proceed.  History of Present Illness   Kim Watson is a 53 year old female who presents with a persistent rash following back surgery.  Following back surgery on July 13, 2024, she developed a persistent rash on her back. Despite using topical treatments, including a full container of valtrex , the rash disappears for a day before returning. It is painful, especially when affecting nerve endings, and has been present since her two-week post-operative appointment. She applies a [place] cream, which temporarily dries the rash, but it reappears in different spots on her back.  She is experiencing difficulty with weight loss, unable to drop below 250 pounds. She was on Wegovy  for weight management, but Medicaid has stopped covering it. She has three weeks of medication left and is considering other options due to the coverage issue.  She experiences sleep disturbances, including panic attacks and gasping for breath upon waking, which her husband has noticed. She has not had a sleep study since 2015, though she describes symptoms suggestive of sleep apnea. She uses Klonopin  for panic attacks and has tried Trazodone , which leaves her feeling groggy in the morning.  She experiences constipation, going to the bathroom once a week if lucky, despite drinking 32 ounces of water  daily and taking Colace. She is concerned about straining and potential hemorrhoids.     ROS See pertinent positives and negatives per HPI.     Objective:    BP 128/80 (BP Location: Left Arm, Patient Position: Sitting, Cuff Size: Large)   Pulse 84   Temp 98.5 F (36.9 C) (Oral)   Ht 5' 6  (1.676 m)   Wt 258 lb (117 kg)   LMP  (LMP Unknown)   SpO2 98%   BMI 41.64 kg/m  BP Readings from Last 3 Encounters:  09/12/24 128/80  08/25/24 118/76  08/08/24 124/62   Wt Readings from Last 3 Encounters:  09/12/24 258 lb (117 kg)  08/25/24 264 lb (119.7 kg)  08/08/24 263 lb (119.3 kg)    Physical Exam Vitals and nursing note reviewed.  Constitutional:      General: She is not in acute distress.    Appearance: Normal appearance.  HENT:     Head: Normocephalic.  Eyes:     Conjunctiva/sclera: Conjunctivae normal.  Pulmonary:     Effort: Pulmonary effort is normal.  Musculoskeletal:     Cervical back: Normal range of motion.  Skin:    General: Skin is warm.     Findings: Rash (vesicular rash to right lower back, dark/flat areas on lower back where rash was prior) present.  Neurological:     General: No focal deficit present.     Mental Status: She is alert and oriented to person, place, and time.  Psychiatric:        Mood and Affect: Mood normal.        Behavior: Behavior normal.        Thought Content: Thought content normal.        Judgment: Judgment normal.       Assessment & Plan:   Problem List Items Addressed This  Visit       Respiratory   OSA (obstructive sleep apnea)   Suspected obstructive sleep apnea is indicated by symptoms of gasping for breath during sleep and panic attacks. The last sleep study was in 2015. Referral placed to sleep medicine.       Relevant Orders   Ambulatory referral to Sleep Studies     Digestive   Chronic constipation   Chronic constipation may be related to medication side effects, and current treatment with Colace is insufficient. Recommend Miralax  once daily as needed for constipation relief. Encourage fluids.         Musculoskeletal and Integument   Rash   Recurrent herpes zoster vs simplex presents with a painful rash exacerbated post-surgery. Start Valtrex  1000 mg twice daily for 7 days, then 500 mg daily for at  least a month. Advise avoiding popping lesions to prevent infection.        Other   Morbid obesity (HCC)   BMI 41.6. Obesity persists with a weight plateau at 250-260 lbs. Medicaid no longer covers Wegovy , and surgical options for further weight loss are under consideration. Order a chest x-ray and EKG for preoperative evaluation, and check ferritin levels. Discuss the potential for a sleep study to explore other weight loss medication options.       Preop examination - Primary   EKG, chest xray, and ferritin needed per bariatric surgeon. Orders placed today. EKG shows normal sinus rhythm with a heart rate of 75. No ST or T wave changes.       Relevant Orders   Iron, TIBC and Ferritin Panel   EKG 12-Lead (Completed)   DG Chest 2 View   Environmental and seasonal allergies   Flonase  refill sent to the pharmacy.       Relevant Medications   fluticasone  (FLONASE ) 50 MCG/ACT nasal spray    Meds ordered this encounter  Medications   fluticasone  (FLONASE ) 50 MCG/ACT nasal spray    Sig: Place 2 sprays into both nostrils daily.    Dispense:  16 g    Refill:  6   valACYclovir  (VALTREX ) 500 MG tablet    Sig: Take 2 tablets twice a day for 7 days, then take 1 tablet daily    Dispense:  90 tablet    Refill:  1    Return if symptoms worsen or fail to improve.  Tinnie DELENA Harada, NP

## 2024-09-12 NOTE — Assessment & Plan Note (Signed)
 EKG, chest xray, and ferritin needed per bariatric surgeon. Orders placed today. EKG shows normal sinus rhythm with a heart rate of 75. No ST or T wave changes.

## 2024-09-13 LAB — IRON,TIBC AND FERRITIN PANEL
%SAT: 21 % (ref 16–45)
Ferritin: 30 ng/mL (ref 16–232)
Iron: 72 ug/dL (ref 45–160)
TIBC: 340 ug/dL (ref 250–450)

## 2024-09-15 ENCOUNTER — Encounter: Payer: Self-pay | Admitting: Nurse Practitioner

## 2024-09-21 ENCOUNTER — Encounter: Payer: Self-pay | Admitting: Nurse Practitioner

## 2024-09-21 DIAGNOSIS — Z01818 Encounter for other preprocedural examination: Secondary | ICD-10-CM

## 2024-09-22 NOTE — Telephone Encounter (Signed)
 Please see message below

## 2024-09-22 NOTE — Telephone Encounter (Signed)
 Copied from CRM [id number]. Topic: Referral - Question >> Sep 22, 2024  4:05 PM Burnard DEL wrote: Reason for CRM: Blondie from heart care doctor cardiology called in stating that they received referral on behalf of patient they need to know the doctor that is performing procedure,and if there is going to be any kind of anesthesia ,if so what kind,as well as what type of procedure patient is having?

## 2024-09-23 ENCOUNTER — Telehealth (HOSPITAL_BASED_OUTPATIENT_CLINIC_OR_DEPARTMENT_OTHER): Payer: Self-pay | Admitting: *Deleted

## 2024-09-23 ENCOUNTER — Other Ambulatory Visit: Payer: Self-pay

## 2024-09-23 DIAGNOSIS — M4316 Spondylolisthesis, lumbar region: Secondary | ICD-10-CM

## 2024-09-23 NOTE — Telephone Encounter (Signed)
 I called to s/w [place] who sent a referral yesterday for preop clearance. Our office was also needing the preop clearance request before scheduling new pt appt.    I left a verbal message with Jamee with the information that is needed for the preop team and the cardiologist can proceed.   Our fax# [phone]. We will wait for clearance request.   I will also run this past the preop APP if appt is to be with MD or can be with HF1st APP.

## 2024-09-23 NOTE — Telephone Encounter (Signed)
   Name: Kim Watson  DOB: [birth year]  MRN: [id number]  Primary Cardiologist: None  Chart reviewed as part of pre-operative protocol coverage. Because of [name] past medical history and time since last visit, she will require a follow-up in-office visit in order to better assess preoperative cardiovascular risk. Can be [facility].  Pre-op covering staff: - Please schedule appointment and call patient to inform them. If patient already had an upcoming appointment within acceptable timeframe, please add pre-op clearance to the appointment notes so provider is aware. - Please contact requesting surgeon's office via preferred method (i.e, phone, fax) to inform them of need for appointment prior to surgery.     Lamarr Satterfield, NP  09/23/2024, 4:19 PM

## 2024-09-23 NOTE — Telephone Encounter (Signed)
 Called patient to set up an office appointment for pre-op clearance on 10/04/24 @ 1:55.

## 2024-09-23 NOTE — Telephone Encounter (Signed)
 I called and spoke with Blondie and gave her fax number and below information regarding Marc's surgery.

## 2024-09-23 NOTE — Telephone Encounter (Signed)
 Left message for the pt to call back as she will need a new pt appt with the HF1st provider per Lamarr CROME, DNP.

## 2024-09-23 NOTE — Telephone Encounter (Signed)
 Below message request was placed on a letterhead and faxed to [phone]

## 2024-09-23 NOTE — Telephone Encounter (Addendum)
   Pre-operative Risk Assessment    Patient Name: Kim Watson  DOB: [birth year] MRN: [id number]   Date of last office visit: N/A Date of next office visit: N/A   Request for Surgical Clearance    Procedure:  Bariatric surgery revision  Date of Surgery:  Clearance TBD                                 Surgeon:  Dr. [place] Surgeon's Group or Practice Name:  [facility] Phone number:  [facility] [phone]  Fax number:  [facility] (Dr. Suzanne office) [phone]   Type of Clearance Requested:   - Medical    Type of Anesthesia:  General    Additional requests/questions:  Clearance from [employer] - Tinnie Harada, NP - [phone] [place], CMA)  Signed, Patrcia Iverson CROME   09/23/2024, 4:05 PM

## 2024-09-23 NOTE — Telephone Encounter (Signed)
 S/w the pt and she has been scheduled to see Raphael Bring, [facility] 10/04/24 11 am. Per preop APP Lamarr CROME. DNP to schedule on HF1st.

## 2024-09-23 NOTE — Telephone Encounter (Signed)
 She needs: Type of surgery The surgeon's name Our phone and fax# If any blood thinners need to be held What type of anesthesia  Can be on letterhead and faxed to pre surgery team at  [phone]  When they receive this they can schedule as NP.

## 2024-09-29 ENCOUNTER — Encounter: Admitting: Neurosurgery

## 2024-09-29 ENCOUNTER — Other Ambulatory Visit

## 2024-10-02 ENCOUNTER — Other Ambulatory Visit: Payer: Self-pay | Admitting: Nurse Practitioner

## 2024-10-02 DIAGNOSIS — F32A Depression, unspecified: Secondary | ICD-10-CM

## 2024-10-02 NOTE — Progress Notes (Unsigned)
   REFERRING PHYSICIAN:  Nedra Tinnie LABOR, Np [address] [place],  [place] [address]  DOS: 07/13/24   PSF/XLIF L4-L5  HISTORY OF PRESENT ILLNESS:  She was doing well at her last visit with some thigh numbness that was getting better.   She is doing well. Numbness in right thigh is about the same. No significant back or leg pain.     PHYSICAL EXAMINATION:  General: Patient is well developed, well nourished, calm, collected, and in no apparent distress.   NEUROLOGICAL:  General: In no acute distress.   Awake, alert, oriented to person, place, and time.  Pupils equal round and reactive to light.  Facial tone is symmetric.     Strength:           Side Iliopsoas Quads Hamstring PF DF EHL  R 5 5 5 5 5 5   L 5 5 5 5 5 5    Incisions well healed.    ROS (Neurologic):  Negative except as noted above  IMAGING: Lumbar xrays dated 10/05/24:  No complications noted.  Report for above xrays is not yet available.   ASSESSMENT/PLAN:  KRISTALYNN CODDINGTON is doing well s/p above surgery. Treatment options reviewed with patient and following plan made:   - She can slowly return to activity as tolerated.  - Follow up as scheduled in 6 months and prn.   Advised to contact the office if any questions or concerns arise.  Glade Boys PA-C Department of neurosurgery

## 2024-10-03 ENCOUNTER — Encounter: Payer: Self-pay | Admitting: Radiology

## 2024-10-03 NOTE — Telephone Encounter (Signed)
 Pts LOV 09/12/2024. Last refill on 03/15/2024. FOV scheduled for 11/08/2024. Pt is due for this medication refill. 90 days sent.

## 2024-10-03 NOTE — Progress Notes (Addendum)
 Cardiology Office Note    Date:  10/04/2024  ID:  Kim Watson, DOB August 19, 1971, MRN [id number] PCP:  Kim Watson LABOR, NP  Cardiologist:  None - New  Chief Complaint: preoperative evaluation.  History of Present Illness: .    Kim Watson is a 53 y.o. female with visit-pertinent history of morbid obesity, suspected OSA pending sleep study, LAFB by EKG, chronic constipation, spondylolisthesis, prior lumbar surgery, anxiety, arthritis, HTN, dyslipidemia by labs seen for evaluation of preoperative evaluation at the request of Dr. Sudan.  Preop EKG by PCP done 09/12/24 showed NSR 75bpm with LAFB, poor R wave progression, nonspecific STTW changes. Questionable prior anterolateral infarct versus related to body habitus. EKG here appears similar.  She is pursuing bariatric surgery revision through [facility]. She reports a history of some chronic dyspnea on exertion at higher levels of activity. She is able to exercise 3x a week, but things like walking up a hill, higher intensity chores or yardwork produce dyspnea. She thinks this may be weight related. It has been somewhat better since she decreased her weight from 290lb. She denies any CP, palpitations, syncope. BP is elevated today at 150/90, recheck by me 138/88. She reports this is out of the norm for her, as BP is usually normal. She attributes this to a stressful conversation with a family member right before this visit.   Family History: maternal grandmother had heart attack in her 50s, maternal uncle dealing with fluid retention/possible CHF Tobacco: none Alcohol: none Drug use: none   Labwork independently reviewed: 05/2024 Mg 2.1, TSH OK, LP(a) 85, LDL 133, trig 191, CMET OK except ALT 41/chronically mildly elevated; K 4.3, Hgb 15, plt OK  ROS: .    Please see the history of present illness.  All other systems are reviewed and otherwise negative.  Studies Reviewed: SABRA    EKG:  EKG is ordered today, personally reviewed,  demonstrating  EKG Interpretation Date/Time:  Tuesday October 04 2024 10:57:23 EST Ventricular Rate:  83 PR Interval:  168 QRS Duration:  90 QT Interval:  398 QTC Calculation: 467 R Axis:   -50  Text Interpretation: Normal sinus rhythm Left anterior fascicular block Minimal voltage criteria for LVH, may be normal variant ( R in aVL ) Possible Anterolateral infarct (cited on or before 30-Jun-2024) Nonspecific ST and T wave abnormality Confirmed by Kim Watson [phone]) on 10/04/2024 11:07:16 AM    Preop EKG by PCP done 09/12/24 showed NSR 75bpm with LAFB, poor R wave progression, nonspecific STTW changes.  CV Studies: Cardiac studies reviewed are outlined and summarized above. Otherwise please see EMR for full report.   Current Reported Medications:.    Current Meds  Medication Sig   clonazePAM  (KLONOPIN ) 0.5 MG disintegrating tablet Take 1 tablet (0.5 mg total) by mouth 2 (two) times daily as needed for seizure. DISSOLVE 1 TABLET(0.5 MG) ON THE TONGUE TWICE DAILY   cloNIDine  (CATAPRES  - DOSED IN MG/24 HR) 0.1 mg/24hr patch APPLY 1 PATCH(0.1 MG) TOPICALLY TO THE SKIN 1 TIME A WEEK   clotrimazole -betamethasone  (LOTRISONE ) cream Apply 1 Application topically 2 (two) times daily as needed (rash).   cyclobenzaprine  (FLEXERIL ) 10 MG tablet Take 1 tablet (10 mg total) by mouth 3 (three) times daily as needed for muscle spasms.   DULoxetine  (CYMBALTA ) 60 MG capsule TAKE 1 CAPSULE(60 MG) BY MOUTH DAILY   fluticasone  (FLONASE ) 50 MCG/ACT nasal spray Place 2 sprays into both nostrils daily.   gabapentin  (NEURONTIN ) 300 MG capsule Take 2 capsules (600  mg total) by mouth 3 (three) times daily.   nystatin  powder Apply 1 Application topically 3 (three) times daily. As needed   pantoprazole  (PROTONIX ) 40 MG tablet TAKE 1 TABLET(40 MG) BY MOUTH DAILY   valACYclovir  (VALTREX ) 500 MG tablet Take 2 tablets twice a day for 7 days, then take 1 tablet daily    Physical Exam:    VS:  BP (!) 150/90    Pulse 83   Ht 5' 6 (1.676 m)   Wt 255 lb 9.6 oz (115.9 kg)   LMP  (LMP Unknown)   SpO2 98%   BMI 41.25 kg/m    Wt Readings from Last 3 Encounters:  10/04/24 255 lb 9.6 oz (115.9 kg)  09/12/24 258 lb (117 kg)  08/25/24 264 lb (119.7 kg)    GEN: Well nourished, well developed in no acute distress NECK: No JVD; No carotid bruits CARDIAC: RRR, no murmurs, rubs, gallops RESPIRATORY:  Clear to auscultation without rales, wheezing or rhonchi  ABDOMEN: Soft, non-tender, non-distended EXTREMITIES:  No edema; No acute deformity   Asessement and Plan:.    1. Preoperative cardiovascular evaluation, abnormal EKG, DOE - asked to weigh in for pre-operative evaluation. She is able to exercise 3 days a week, but does report dyspnea on exertion with activities such as walking up stairs/hill, heavier chores, or yardwork that has not been previously investigated. Agree with patient assessment this may be weight related. However, given baseline abnormal EKG and need for pre-operative risk stratification, will obtain baseline echocardiogram and cardiac PET stress test for further evaluation. I will send a copy of this note to requesting surgeon with a plan to addend the report once testing is complete. If these are reassuring, I anticipate the green light for surgery.  Informed Consent   Shared Decision Making/Informed Consent The risks [chest pain, shortness of breath, cardiac arrhythmias, dizziness, blood pressure fluctuations, myocardial infarction, stroke/transient ischemic attack, nausea, vomiting, allergic reaction, radiation exposure, metallic taste sensation and life-threatening complications (estimated to be 1 in 10,000)], benefits (risk stratification, diagnosing coronary artery disease, treatment guidance) and alternatives of a cardiac PET stress test were discussed in detail with Kim Watson and she agrees to proceed.    2. Left anterior fascicular block - no s/sx of bradycardia. Follow.   3.  Essential HTN - BP elevated today which she reports is out of the norm for her. She attributes this to a stressful conversation with a family member right before the visit. The patient was instructed to monitor their blood pressure at home and to call if tending to run higher than 130/80. She is managed with clonidine  patch by PCP.  4. Dyslipidemia - management per primary care.    Addendum: insurance would not cover cardiac PET, changed to Lexiscan nuclear stress test (chosen due to lumbar issues).  Informed Consent   Shared Decision Making/Informed Consent The risks [chest pain, shortness of breath, cardiac arrhythmias, dizziness, blood pressure fluctuations, myocardial infarction, stroke/transient ischemic attack, nausea, vomiting, allergic reaction, radiation exposure, metallic taste sensation and life-threatening complications (estimated to be 1 in 10,000)], benefits (risk stratification, diagnosing coronary artery disease, treatment guidance) and alternatives of a nuclear stress test were discussed in detail with Ms. Stefanko and she agrees to proceed.      Disposition: F/u with me 8 weeks.   Signed, Jarrod Bodkins N Jermiah Howton, PA-C

## 2024-10-04 ENCOUNTER — Ambulatory Visit: Attending: Physician Assistant | Admitting: Physician Assistant

## 2024-10-04 ENCOUNTER — Ambulatory Visit: Admitting: Physician Assistant

## 2024-10-04 ENCOUNTER — Encounter: Payer: Self-pay | Admitting: Physician Assistant

## 2024-10-04 VITALS — BP 150/90 | HR 83 | Ht 66.0 in | Wt 255.6 lb

## 2024-10-04 DIAGNOSIS — I10 Essential (primary) hypertension: Secondary | ICD-10-CM | POA: Diagnosis not present

## 2024-10-04 DIAGNOSIS — E785 Hyperlipidemia, unspecified: Secondary | ICD-10-CM | POA: Insufficient documentation

## 2024-10-04 DIAGNOSIS — R9430 Abnormal result of cardiovascular function study, unspecified: Secondary | ICD-10-CM | POA: Insufficient documentation

## 2024-10-04 DIAGNOSIS — R0609 Other forms of dyspnea: Secondary | ICD-10-CM | POA: Insufficient documentation

## 2024-10-04 DIAGNOSIS — R9431 Abnormal electrocardiogram [ECG] [EKG]: Secondary | ICD-10-CM | POA: Diagnosis not present

## 2024-10-04 NOTE — Patient Instructions (Signed)
 Medication Instructions:  Your physician recommends that you continue on your current medications as directed. Please refer to the Current Medication list given to you today.  *If you need a refill on your cardiac medications before your next appointment, please call your pharmacy*  Lab Work: TODAY:  BMET & CBC   If you have labs (blood work) drawn today and your tests are completely normal, you will receive your results only by: MyChart Message (if you have MyChart) OR A paper copy in the mail If you have any lab test that is abnormal or we need to change your treatment, we will call you to review the results.  Testing/Procedures: Your physician has requested that you have an echocardiogram. Echocardiography is a painless test that uses sound waves to create images of your heart. It provides your doctor with information about the size and shape of your heart and how well your heart's chambers and valves are working. This procedure takes approximately one hour. There are no restrictions for this procedure. Please do NOT wear cologne, perfume, aftershave, or lotions (deodorant is allowed). Please arrive 15 minutes prior to your appointment time.  Please note: We ask at that you not bring children with you during ultrasound (echo/ vascular) testing. Due to room size and safety concerns, children are not allowed in the ultrasound rooms during exams. Our front office staff cannot provide observation of children in our lobby area while testing is being conducted. An adult accompanying a patient to their appointment will only be allowed in the ultrasound room at the discretion of the ultrasound technician under special circumstances. We apologize for any inconvenience.      Please report to Radiology at the [facility] Main Entrance 30 minutes early for your test.  [address] [place], [place] [address]                         OR   Please report to Radiology at [facility] Main Entrance, medical mall, 30 mins prior to your test.  [address]  [place], [place]  How to Prepare for Your Cardiac PET/CT Stress Test:  Nothing to eat or drink, except water , 3 hours prior to arrival time.  NO caffeine/decaffeinated products, or chocolate 12 hours prior to arrival. (Please note decaffeinated beverages (teas/coffees) still contain caffeine).  If you have caffeine within 12 hours prior, the test will need to be rescheduled.  Medication instructions: Hold theophylline containing medications for 12 hours. Hold Dipyridamole 48 hours prior to the test.   You may take your remaining medications with water .  NO perfume, cologne or lotion on chest or abdomen area. FEMALES - Please avoid wearing dresses to this appointment.  Total time is 1 to 2 hours; you may want to bring reading material for the waiting time.  IF YOU THINK YOU MAY BE PREGNANT, OR ARE NURSING PLEASE INFORM THE TECHNOLOGIST.  In preparation for your appointment, medication and supplies will be purchased.  Appointment availability is limited, so if you need to cancel or reschedule, please call the Radiology Department Scheduler at [phone] 24 hours in advance to avoid a cancellation fee of $100.00  What to Expect When you Arrive:  Once you arrive and check in for your appointment, you will be taken to a preparation room within the Radiology Department.  A technologist or Nurse will obtain your medical history, verify that you are correctly prepped for the exam, and  explain the procedure.  Afterwards, an IV will be started in your arm and electrodes will be placed on your skin for EKG monitoring during the stress portion of the exam. Then you will be escorted to the PET/CT scanner.  There, staff will get you positioned on the scanner and obtain a blood pressure and EKG.  During the exam, you will continue to be connected to the EKG and blood pressure machines.  A small, safe amount of  a radioactive tracer will be injected in your IV to obtain a series of pictures of your heart along with an injection of a stress agent.    After your Exam:  It is recommended that you eat a meal and drink a caffeinated beverage to counter act any effects of the stress agent.  Drink plenty of fluids for the remainder of the day and urinate frequently for the first couple of hours after the exam.  Your doctor will inform you of your test results within 7-10 business days.  For more information and frequently asked questions, please visit our website: [email or web address]  For questions about your test or how to prepare for your test, please call: Cardiac Imaging Nurse Navigators Office: [phone]   Follow-Up: At [facility], you and your health needs are our priority.  As part of our continuing mission to provide you with exceptional heart care, our providers are all part of one team.  This team includes your primary Cardiologist (physician) and Advanced Practice Providers or APPs (Physician Assistants and Nurse Practitioners) who all work together to provide you with the care you need, when you need it.  Your next appointment:   8 week(s)  Provider:   Dayna Dunn, PA-C          We recommend signing up for the patient portal called MyChart.  Sign up information is provided on this After Visit Summary.  MyChart is used to connect with patients for Virtual Visits (Telemedicine).  Patients are able to view lab/test results, encounter notes, upcoming appointments, etc.  Non-urgent messages can be sent to your provider as well.   To learn more about what you can do with MyChart, go to [email or web address].   Other Instructions Please monitor your blood pressure occasionally at home. Call your doctor if you tend to get readings of greater than 130 on the top number or 80 on the bottom number.

## 2024-10-05 ENCOUNTER — Ambulatory Visit: Payer: Self-pay | Admitting: Physician Assistant

## 2024-10-05 ENCOUNTER — Encounter: Payer: Self-pay | Admitting: Orthopedic Surgery

## 2024-10-05 ENCOUNTER — Ambulatory Visit

## 2024-10-05 ENCOUNTER — Ambulatory Visit: Admitting: Orthopedic Surgery

## 2024-10-05 VITALS — BP 130/82 | Temp 99.0°F | Ht 66.0 in | Wt 256.0 lb

## 2024-10-05 DIAGNOSIS — R0609 Other forms of dyspnea: Secondary | ICD-10-CM

## 2024-10-05 DIAGNOSIS — M4316 Spondylolisthesis, lumbar region: Secondary | ICD-10-CM | POA: Diagnosis not present

## 2024-10-05 DIAGNOSIS — M48061 Spinal stenosis, lumbar region without neurogenic claudication: Secondary | ICD-10-CM | POA: Diagnosis not present

## 2024-10-05 DIAGNOSIS — Z981 Arthrodesis status: Secondary | ICD-10-CM | POA: Diagnosis not present

## 2024-10-05 DIAGNOSIS — R9430 Abnormal result of cardiovascular function study, unspecified: Secondary | ICD-10-CM

## 2024-10-05 LAB — BASIC METABOLIC PANEL WITH GFR
BUN/Creatinine Ratio: 15 (ref 9–23)
BUN: 11 mg/dL (ref 6–24)
CO2: 25 mmol/L (ref 20–29)
Calcium: 9.8 mg/dL (ref 8.7–10.2)
Chloride: 104 mmol/L (ref 96–106)
Creatinine, Ser: 0.74 mg/dL (ref 0.57–1.00)
Glucose: 83 mg/dL (ref 70–99)
Potassium: 4.6 mmol/L (ref 3.5–5.2)
Sodium: 141 mmol/L (ref 134–144)
eGFR: 97 mL/min/1.73 (ref >59)

## 2024-10-05 LAB — CBC
Hematocrit: 45.1 % (ref 34.0–46.6)
Hemoglobin: 14.6 g/dL (ref 11.1–15.9)
MCH: 28.2 pg (ref 26.6–33.0)
MCHC: 32.4 g/dL (ref 31.5–35.7)
MCV: 87 fL (ref 79–97)
Platelets: 288 x10E3/uL (ref 150–450)
RBC: 5.17 x10E6/uL (ref 3.77–5.28)
RDW: 13.9 % (ref 11.7–15.4)
WBC: 6.1 x10E3/uL (ref 3.4–10.8)

## 2024-10-06 ENCOUNTER — Encounter: Payer: Self-pay | Admitting: Neurology

## 2024-10-06 ENCOUNTER — Ambulatory Visit: Admitting: Neurology

## 2024-10-06 VITALS — BP 134/89 | HR 76 | Ht 66.0 in | Wt 254.4 lb

## 2024-10-06 DIAGNOSIS — R519 Headache, unspecified: Secondary | ICD-10-CM

## 2024-10-06 DIAGNOSIS — G4733 Obstructive sleep apnea (adult) (pediatric): Secondary | ICD-10-CM

## 2024-10-06 DIAGNOSIS — Z820 Family history of epilepsy and other diseases of the nervous system: Secondary | ICD-10-CM

## 2024-10-06 DIAGNOSIS — Z9884 Bariatric surgery status: Secondary | ICD-10-CM | POA: Diagnosis not present

## 2024-10-06 DIAGNOSIS — R351 Nocturia: Secondary | ICD-10-CM | POA: Diagnosis not present

## 2024-10-06 DIAGNOSIS — G4719 Other hypersomnia: Secondary | ICD-10-CM

## 2024-10-06 NOTE — Patient Instructions (Addendum)

## 2024-10-06 NOTE — Progress Notes (Signed)
 Subjective:    Patient ID: Kim Watson is a 53 y.o. female.  HPI    True Mar, MD, PhD [facility] [address] [address] [place], [place] [address]  Dear Tinnie,   I saw your patient, Kim Watson, upon your kind request in my sleep clinic today for initial consultation of her sleep disorder, in particular, evaluation of her prior diagnosis of obstructive sleep apnea.  The patient is unaccompanied today.  As you know, Ms. Giannone is a 53 year old female with an underlying medical history of allergies, arthritis, anxiety, depression, migraine headaches, degenerative cervical spine disease and lumbar spine disease with status post ACDF as well as status post lumbar spine surgery, vitamin D  deficiency, and severe obesity with a BMI of over 40, status post sleeve gastrectomy, who reports possible snoring, waking up with a sense of gasping and panic, as well as excessive daytime somnolence.  Her Epworth sleepiness score is 18 out of 24, fatigue severity score is 58 out of 63.  She reports a family history of sleep apnea affecting 3 maternal aunts. I reviewed your office note from 09/12/2024.  Of note, she was previously diagnosed with obstructive sleep apnea in 2010.  I reviewed her sleep study report from 12/19/2008, study was interpreted by Dr. Reggy Salt.  She had an AHI of 15.1/h, O2 nadir 87%.  She had a split-night sleep study at the time and was recommended for CPAP of 6 cm.  She also had another sleep study in or around 2015.  Compared to 2010, her weight is about 30 pounds less, however, she is having trouble losing weight.  She has never been on PAP therapy and reports that insurance did not cover treatment in the past.  She lives with her husband.  She has 2 grown children alive, 1 passed away.  She has 12 grandchildren.  She has 2 dogs, 1 is outside and 1 is inside.  She does have a TV in the bedroom and it tends to stay on at night per husband's  preference.  She drinks no daily caffeine.  She does not drink any alcohol, she is a non-smoker.  Bedtime is around 10 and rise time between 4:30 AM and 5.  She works as a conservation officer, nature at [employer].  She lost significant weight after her sleeve gastrectomy but gained most of it back.  She is working on weight loss.  She has had morning headaches and has nocturia about twice per average night.  Her Past Medical History Is Significant For: Past Medical History:  Diagnosis Date   Allergy 1999   Anxiety    Arthritis    Depression    Gallbladder problem    GERD (gastroesophageal reflux disease)    High blood pressure    Hypertension    Migraine    Obesity    Obstructive sleep apnea    2016 was cleared from having to use CPAP at night. Gastric bypass surgery 2015.   Pneumonia    Sleep apnea 2010   Spondylolisthesis of lumbar region    Vitamin D  deficiency     Her Past Surgical History Is Significant For: Past Surgical History:  Procedure Laterality Date   ANTERIOR CERVICAL DECOMP/DISCECTOMY FUSION N/A 10/02/2022   Procedure: ANTERIOR CERVICAL DISCECTOMY AND FUSION CERVICAL FOUR TO SEVEN;  Surgeon: Burnetta Aures, MD;  Location: [facility] OR;  Service: Orthopedics;  Laterality: N/A;  4hrs 3 C-Bed   ANTERIOR LATERAL LUMBAR FUSION WITH PERCUTANEOUS SCREW  1 LEVEL N/A 07/13/2024   Procedure: L4-5 LATERAL LUMBAR INTERBODY FUSION AND POSTERIOR SPINAL FUSION;  Surgeon: Clois Fret, MD;  Location: [facility] ORS;  Service: Neurosurgery;  Laterality: N/A;  L4-5 LATERAL LUMBAR INTERBODY FUSION AND POSTERIOR SPINAL FUSION   APPLICATION OF INTRAOPERATIVE CT SCAN N/A 07/13/2024   Procedure: APPLICATION OF INTRAOPERATIVE CT SCAN;  Surgeon: Clois Fret, MD;  Location: [facility] ORS;  Service: Neurosurgery;  Laterality: N/A;   BACK SURGERY     august 2025   CARPAL TUNNEL RELEASE Left 12/2023   CHOLECYSTECTOMY     FOOT SURGERY Left 07/26/2022   5th metatarsal   SLEEVE GASTROPLASTY     TUBAL LIGATION       Her Family History Is Significant For: Family History  Problem Relation Age of Onset   Diabetes Maternal Grandmother    Heart disease Maternal Grandmother    Diabetes Niece    Diabetes Niece    Hypertension Maternal Great-grandmother    Hyperlipidemia Maternal Great-grandmother    BRCA 1/2 Neg Hx    Breast cancer Neg Hx    Migraines Neg Hx    Sleep apnea Neg Hx     Her Social History Is Significant For: Social History   Socioeconomic History   Marital status: Significant Other    Spouse name: Ramiro   Number of children: 3   Years of education: Not on file   Highest education level: Some college, no degree  Occupational History   Occupation: Waitress  Tobacco Use   Smoking status: Never    Passive exposure: Never   Smokeless tobacco: Never  Vaping Use   Vaping status: Never Used  Substance and Sexual Activity   Alcohol use: Never    Comment: socially in the past   Drug use: No   Sexual activity: Yes    Birth control/protection: Surgical  Other Topics Concern   Not on file  Social History Narrative   Right handed   Lives with significant other   Caffeine: sometimes decaf coffee   Pt works    Teacher, Early Years/pre Strain: Low Risk  (05/29/2024)   Overall Financial Resource Strain (CARDIA)    Difficulty of Paying Living Expenses: Not very hard  Food Insecurity: No Food Insecurity (07/13/2024)   Hunger Vital Sign    Worried About Running Out of Food in the Last Year: Never true    Ran Out of Food in the Last Year: Never true  Transportation Needs: No Transportation Needs (07/13/2024)   PRAPARE - Administrator, Civil Service (Medical): No    Lack of Transportation (Non-Medical): No  Physical Activity: Inactive (05/29/2024)   Exercise Vital Sign    Days of Exercise per Week: 0 days    Minutes of Exercise per Session: Not on file  Stress: Stress Concern Present (05/29/2024)   [employer] of Occupational Health -  Occupational Stress Questionnaire    Feeling of Stress: Very much  Social Connections: Moderately Isolated (05/29/2024)   Social Connection and Isolation Panel    Frequency of Communication with Friends and Family: Once a week    Frequency of Social Gatherings with Friends and Family: Once a week    Attends Religious Services: More than 4 times per year    Active Member of [employer] or Organizations: No    Attends Engineer, Structural: Not on file    Marital Status: Married    Her Allergies Are:  Allergies  Allergen Reactions  Nsaids Other (See Comments)    History of gastric bypass   Zestril [Lisinopril] Cough       :   Her Current Medications Are:  Outpatient Encounter Medications as of 10/06/2024  Medication Sig   clonazePAM  (KLONOPIN ) 0.5 MG disintegrating tablet Take 1 tablet (0.5 mg total) by mouth 2 (two) times daily as needed for seizure. DISSOLVE 1 TABLET(0.5 MG) ON THE TONGUE TWICE DAILY   cloNIDine  (CATAPRES  - DOSED IN MG/24 HR) 0.1 mg/24hr patch APPLY 1 PATCH(0.1 MG) TOPICALLY TO THE SKIN 1 TIME A WEEK   clotrimazole -betamethasone  (LOTRISONE ) cream Apply 1 Application topically 2 (two) times daily as needed (rash).   cyclobenzaprine  (FLEXERIL ) 10 MG tablet Take 1 tablet (10 mg total) by mouth 3 (three) times daily as needed for muscle spasms.   DULoxetine  (CYMBALTA ) 60 MG capsule TAKE 1 CAPSULE(60 MG) BY MOUTH DAILY   fluticasone  (FLONASE ) 50 MCG/ACT nasal spray Place 2 sprays into both nostrils daily.   gabapentin  (NEURONTIN ) 300 MG capsule Take 2 capsules (600 mg total) by mouth 3 (three) times daily.   nystatin  powder Apply 1 Application topically 3 (three) times daily. As needed   pantoprazole  (PROTONIX ) 40 MG tablet TAKE 1 TABLET(40 MG) BY MOUTH DAILY   valACYclovir  (VALTREX ) 500 MG tablet Take 2 tablets twice a day for 7 days, then take 1 tablet daily   No facility-administered encounter medications on file as of 10/06/2024.  :   Review of Systems:  Out  of a complete 14 point review of systems, all are reviewed and negative with the exception of these symptoms as listed below:  Review of Systems  Objective:  Neurological Exam  Physical Exam Physical Examination:   Vitals:   10/06/24 1253  BP: 134/89  Pulse: 76    General Examination: The patient is a very pleasant 53 y.o. female in no acute distress. She appears well-developed and well-nourished and well groomed.   HEENT: Normocephalic, atraumatic, pupils are equal, round and reactive to light, extraocular tracking is good without limitation to gaze excursion or nystagmus noted. No photophobia. Corrective eye glasses in place. Hearing is grossly intact.  Face is symmetric with normal facial animation. Speech is clear without dysarthria. There is no hypophonia. There is no lip, neck/head, jaw or voice tremor. Neck is supple with full range of passive and active motion. There are no carotid bruits on auscultation.  Airway/Oropharynx exam reveals: mild mouth dryness, adequate dental hygiene and moderate airway crowding, due to longer uvula, Mallampati class II, neck circumference 16 inches, tonsillar size of about 2+ on the left and 1-2+ on the right.  Tongue protrudes centrally and palate elevates symmetrically.  Chest: Clear to auscultation without wheezing, rhonchi or crackles noted.  Heart: S1+S2+0, regular and normal without murmurs, rubs or gallops noted.   Abdomen: Soft, non-tender and non-distended.  Extremities: There is no pitting edema in the distal lower extremities bilaterally.   Skin: Warm and dry without trophic changes noted.   Musculoskeletal: exam reveals no obvious joint deformities.   Neurologically:  Mental status: The patient is awake, alert and oriented in all 4 spheres. Her immediate and remote memory, attention, language skills and fund of knowledge are appropriate. There is no evidence of aphasia, agnosia, apraxia or anomia. Speech is clear with normal  prosody and enunciation. Thought process is linear. Mood is normal and affect is normal.  Cranial nerves II - XII are as described above under HEENT exam.  Motor exam: Normal bulk, strength and tone is  noted. There is no obvious action or resting tremor.  Fine motor skills and coordination: Intact grossly.  Cerebellar testing: No dysmetria or intention tremor. There is no truncal or gait ataxia.  Sensory exam: intact to light touch in the upper and lower extremities.  Gait, station and balance: She stands easily. No veering to one side is noted. No leaning to one side is noted. Posture is age-appropriate and stance is narrow based. Gait shows normal stride length and normal pace. No problems turning are noted.   Assessment and plan:   In summary, Kim Watson is a very pleasant 53 y.o.-year old female with an underlying medical history of allergies, arthritis, anxiety, depression, migraine headaches, degenerative cervical spine disease and lumbar spine disease with status post ACDF as well as status post lumbar spine surgery, vitamin D  deficiency, and severe obesity with a BMI of over 40, status post sleeve gastrectomy, who presents for evaluation of her obstructive sleep apnea.  She was diagnosed with moderate obstructive sleep apnea several years ago and has had significant weight fluctuation since then.  She has never been on PAP therapy but would be willing to get reevaluated and consider treatment.   A laboratory attended sleep study is typically considered gold standard for evaluation of sleep disordered breathing.   I had a long chat with the patient about my findings and the diagnosis of sleep apnea, particularly OSA, its prognosis and treatment options. We talked about medical/conservative treatments, surgical interventions and non-pharmacological approaches for symptom control. I explained, in particular, the risks and ramifications of untreated moderate to severe OSA, especially with  respect to developing cardiovascular disease down the road, including congestive heart failure (CHF), difficult to treat hypertension, cardiac arrhythmias (particularly A-fib), neurovascular complications including TIA, stroke and dementia. Even type 2 diabetes has, in part, been linked to untreated OSA. Symptoms of untreated OSA may include (but may not be limited to) daytime sleepiness, nocturia (i.e. frequent nighttime urination), memory problems, mood irritability and suboptimally controlled or worsening mood disorder such as depression and/or anxiety, lack of energy, lack of motivation, physical discomfort, as well as recurrent headaches, especially morning or nocturnal headaches. We talked about the importance of maintaining a healthy lifestyle and striving for healthy weight. In addition, we talked about the importance of striving for and maintaining good sleep hygiene. I recommended a sleep study at this time. I outlined the differences between a laboratory attended sleep study which is considered more comprehensive and accurate over the option of a home sleep test (HST); the latter may lead to underestimation of sleep disordered breathing in some instances and does not help with diagnosing upper airway resistance syndrome and is not accurate enough to diagnose primary central sleep apnea typically. I outlined possible surgical and non-surgical treatment options of OSA, including the use of a positive airway pressure (PAP) device (i.e. CPAP, AutoPAP/APAP or BiPAP in certain circumstances), a custom-made dental device (aka oral appliance, which would require a referral to a specialist dentist or orthodontist typically, and is generally speaking not considered for patients with full dentures or edentulous state), upper airway surgical options, such as traditional UPPP (which is not considered a first-line treatment) or the Inspire device (hypoglossal nerve stimulator, which would involve a referral for  consultation with an ENT surgeon, after careful selection, following inclusion criteria - also not first-line treatment). I explained the PAP treatment option to the patient in detail, as this is generally considered first-line treatment.  The patient indicated that she would  be willing to try PAP therapy, if the need arises. I explained the importance of being compliant with PAP treatment, not only for insurance purposes but primarily to improve patient's symptoms symptoms, and for the patient's long term health benefit, including to reduce Her cardiovascular risks longer-term.    We will pick up our discussion about the next steps and treatment options after testing.  We will keep her posted as to the test results by phone call and/or MyChart messaging where possible.  We will plan to follow-up in sleep clinic accordingly as well.  I answered all her questions today and the patient was in agreement.   I encouraged her to call with any interim questions, concerns, problems or updates or email us  through MyChart.  Generally speaking, sleep test authorizations may take up to 2 weeks, sometimes less, sometimes longer, the patient is encouraged to get in touch with us  if they do not hear back from the sleep lab staff directly within the next 2 weeks.  Thank you very much for allowing me to participate in the care of this nice patient. If I can be of any further assistance to you please do not hesitate to call me at [phone].  Sincerely,   True Mar, MD, PhD

## 2024-10-19 ENCOUNTER — Telehealth: Payer: Self-pay | Admitting: Neurology

## 2024-10-19 NOTE — Telephone Encounter (Signed)
 [employer] no auth req via fax

## 2024-10-19 NOTE — Telephone Encounter (Signed)
 [employer] [employer] pending

## 2024-10-20 ENCOUNTER — Other Ambulatory Visit: Payer: Self-pay | Admitting: Nurse Practitioner

## 2024-10-20 DIAGNOSIS — G4733 Obstructive sleep apnea (adult) (pediatric): Secondary | ICD-10-CM | POA: Diagnosis not present

## 2024-10-20 DIAGNOSIS — Z9884 Bariatric surgery status: Secondary | ICD-10-CM | POA: Diagnosis not present

## 2024-10-20 DIAGNOSIS — Z6841 Body Mass Index (BMI) 40.0 and over, adult: Secondary | ICD-10-CM | POA: Diagnosis not present

## 2024-10-20 DIAGNOSIS — Z713 Dietary counseling and surveillance: Secondary | ICD-10-CM | POA: Diagnosis not present

## 2024-10-20 DIAGNOSIS — K912 Postsurgical malabsorption, not elsewhere classified: Secondary | ICD-10-CM | POA: Diagnosis not present

## 2024-10-25 ENCOUNTER — Encounter: Payer: Self-pay | Admitting: Plastic Surgery

## 2024-10-25 ENCOUNTER — Ambulatory Visit: Admitting: Plastic Surgery

## 2024-10-25 VITALS — BP 139/89 | HR 80 | Ht 66.0 in | Wt 255.0 lb

## 2024-10-25 DIAGNOSIS — Z6841 Body Mass Index (BMI) 40.0 and over, adult: Secondary | ICD-10-CM

## 2024-10-25 DIAGNOSIS — M542 Cervicalgia: Secondary | ICD-10-CM

## 2024-10-25 DIAGNOSIS — M546 Pain in thoracic spine: Secondary | ICD-10-CM

## 2024-10-25 DIAGNOSIS — F330 Major depressive disorder, recurrent, mild: Secondary | ICD-10-CM

## 2024-10-25 DIAGNOSIS — G8929 Other chronic pain: Secondary | ICD-10-CM

## 2024-10-25 DIAGNOSIS — Z9884 Bariatric surgery status: Secondary | ICD-10-CM | POA: Diagnosis not present

## 2024-10-25 DIAGNOSIS — N62 Hypertrophy of breast: Secondary | ICD-10-CM | POA: Diagnosis not present

## 2024-10-25 DIAGNOSIS — Z981 Arthrodesis status: Secondary | ICD-10-CM

## 2024-10-25 DIAGNOSIS — M532X6 Spinal instabilities, lumbar region: Secondary | ICD-10-CM

## 2024-10-25 NOTE — Progress Notes (Signed)
   Subjective:    Patient ID: Kim Watson, female    DOB: [birth year], 53 y.o.   MRN: [id number]  Patient is a 53 year old female here for follow-up.  She complains of neck and back pain that is getting worse despite her efforts.  She has tried using pads and pins to pin up her bra.  That does not seem to be helping.  This inhibits her from doing any heavy exercise or running.  She is 5 feet 6 inches tall and weighs 255 pounds.  Her BMI is 41.2 kg/m.  She did have a gastric sleeve but it did not help in any remarkable way.  The amount of tissue that I think I can get off is 952 at 1000 g.  This is off of both breasts.  She has made great improvements in her overall weight.  She does have serious back issues with both the cervical and thoracic spine surgery.      Review of Systems  Constitutional:  Positive for activity change. Negative for appetite change.  Eyes: Negative.   Respiratory: Negative.    Cardiovascular: Negative.   Gastrointestinal: Negative.   Endocrine: Negative.   Genitourinary: Negative.   Musculoskeletal:  Positive for back pain and neck pain.  Skin:  Positive for rash.       Objective:   Physical Exam Vitals reviewed.  Constitutional:      Appearance: Normal appearance.  HENT:     Head: Atraumatic.  Cardiovascular:     Rate and Rhythm: Normal rate.     Pulses: Normal pulses.  Pulmonary:     Effort: Pulmonary effort is normal.  Abdominal:     General: There is no distension.     Palpations: Abdomen is soft.     Tenderness: There is no abdominal tenderness.  Skin:    General: Skin is warm.     Capillary Refill: Capillary refill takes less than 2 seconds.  Neurological:     Mental Status: She is alert and oriented to person, place, and time.  Psychiatric:        Mood and Affect: Mood normal.        Behavior: Behavior normal.        Thought Content: Thought content normal.        Judgment: Judgment normal.           Assessment & Plan:      ICD-10-CM   1. Spinal instability, lumbar  M53.2X6     2. S/P lumbar fusion  Z98.1     3. Major depressive disorder, recurrent, mild  F33.0     4. Symptomatic mammary hypertrophy  N62     5. Chronic bilateral thoracic back pain  M54.6    G89.29        It is very reasonable to go ahead and submit for bilateral breast reduction with liposuction.  Patient is aware of the risks of complications including slow healing.  Pictures were obtained of the patient and placed in the chart with the patient's or guardian's permission.

## 2024-10-26 ENCOUNTER — Ambulatory Visit (HOSPITAL_COMMUNITY)

## 2024-11-01 ENCOUNTER — Ambulatory Visit (HOSPITAL_COMMUNITY)
Admission: RE | Admit: 2024-11-01 | Discharge: 2024-11-01 | Disposition: A | Discharge status: Home / Self Care | Source: Ambulatory Visit | Attending: Cardiology | Admitting: Cardiology

## 2024-11-01 DIAGNOSIS — R9430 Abnormal result of cardiovascular function study, unspecified: Secondary | ICD-10-CM | POA: Diagnosis not present

## 2024-11-01 DIAGNOSIS — R0609 Other forms of dyspnea: Secondary | ICD-10-CM | POA: Insufficient documentation

## 2024-11-01 DIAGNOSIS — M25512 Pain in left shoulder: Secondary | ICD-10-CM | POA: Diagnosis not present

## 2024-11-01 LAB — ECHOCARDIOGRAM COMPLETE
Area-P 1/2: 3.48 cm2
S' Lateral: 2.6 cm

## 2024-11-03 NOTE — Telephone Encounter (Signed)
 [employer]- [employer] no auth req via fax   Patient is scheduled at [facility] for 12/16/2024 at 9 pm.  Mailed packet and sent mychart

## 2024-11-08 ENCOUNTER — Ambulatory Visit: Admitting: Nurse Practitioner

## 2024-11-10 DIAGNOSIS — M180 Bilateral primary osteoarthritis of first carpometacarpal joints: Secondary | ICD-10-CM | POA: Diagnosis not present

## 2024-11-10 NOTE — Telephone Encounter (Signed)
 Spoke with pt. Pt is aware that apt is 11/15/24. Pt was able to log into mychart and see the appointment date and time. Pt did receive instructions for testing.

## 2024-11-11 ENCOUNTER — Other Ambulatory Visit: Payer: Self-pay | Admitting: Physician Assistant

## 2024-11-11 ENCOUNTER — Telehealth: Payer: Self-pay

## 2024-11-11 DIAGNOSIS — R9430 Abnormal result of cardiovascular function study, unspecified: Secondary | ICD-10-CM

## 2024-11-11 DIAGNOSIS — R0609 Other forms of dyspnea: Secondary | ICD-10-CM

## 2024-11-11 NOTE — Telephone Encounter (Signed)
 I called and spoke with patient and notified her that denial was received for a procedure and it was to go to [employer], DO. Patient gave the physician's office number of [phone].  I called and spoke with Powell at [facility] and the office received same denial form so no need to fax there.

## 2024-11-11 NOTE — Telephone Encounter (Signed)
 Left message for patient to return call.

## 2024-11-14 ENCOUNTER — Telehealth (HOSPITAL_COMMUNITY): Payer: Self-pay

## 2024-11-14 NOTE — Telephone Encounter (Signed)
 Attempted to contact the patient with her instructions, but her phone wouldn't allow me to leave a message or speak with her. S.Heavan Francom CCT

## 2024-11-15 ENCOUNTER — Ambulatory Visit (HOSPITAL_COMMUNITY)
Admission: RE | Admit: 2024-11-15 | Discharge: 2024-11-15 | Disposition: A | Discharge status: Home / Self Care | Source: Ambulatory Visit | Attending: Cardiology | Admitting: Cardiology

## 2024-11-15 DIAGNOSIS — R9430 Abnormal result of cardiovascular function study, unspecified: Secondary | ICD-10-CM

## 2024-11-15 DIAGNOSIS — R0609 Other forms of dyspnea: Secondary | ICD-10-CM

## 2024-11-22 ENCOUNTER — Other Ambulatory Visit: Payer: Self-pay | Admitting: Neurosurgery

## 2024-11-22 ENCOUNTER — Ambulatory Visit (INDEPENDENT_AMBULATORY_CARE_PROVIDER_SITE_OTHER): Admitting: Neurosurgery

## 2024-11-22 ENCOUNTER — Other Ambulatory Visit

## 2024-11-22 VITALS — BP 142/94 | Ht 67.0 in | Wt 252.6 lb

## 2024-11-22 DIAGNOSIS — Z981 Arthrodesis status: Secondary | ICD-10-CM | POA: Diagnosis not present

## 2024-11-22 DIAGNOSIS — M4316 Spondylolisthesis, lumbar region: Secondary | ICD-10-CM

## 2024-11-22 DIAGNOSIS — M545 Low back pain, unspecified: Secondary | ICD-10-CM | POA: Diagnosis not present

## 2024-11-22 NOTE — Progress Notes (Signed)
" ° °  REFERRING PHYSICIAN:  Nedra Tinnie LABOR, [address] [place],  [place] [address]  DOS: 07/13/24   PSF/XLIF L4-L5  HISTORY OF PRESENT ILLNESS:  Mrs. Zamor presents after a couple of falls.  She had a 1 event when she was going to the restroom where she had an awkward slip.  She began having pain around the inner side of her thigh.  She was concerned that she may have damaged her implants.     PHYSICAL EXAMINATION:  General: Patient is well developed, well nourished, calm, collected, and in no apparent distress.   NEUROLOGICAL:  General: In no acute distress.   Awake, alert, oriented to person, place, and time.  Pupils equal round and reactive to light.  Facial tone is symmetric.     Strength:           Side Iliopsoas Quads Hamstring PF DF EHL  R 5 5 5 5 5 5   L 5 5 5 5 5 5    Incisions well healed.    ROS (Neurologic):  Negative except as noted above  IMAGING: Lumbar xrays dated 10/05/24:  No complications noted.  X-rays today are stable  ASSESSMENT/PLAN:  TRYSTA SHOWMAN is doing well s/p above surgery.   I feel that she likely had a muscular injury due to the awkward nature of her slip and fall.  I suggested that she consider putting Voltaren gel on her muscles and continue taking Tylenol  and muscle relaxants as tolerated.  I think this is very likely to improve with time.  I have asked her to check in with us  in approximately 2 weeks.  We will see her back in a few months if things improve as expected.    Reeves Daisy Department of neurosurgery "

## 2024-11-25 ENCOUNTER — Other Ambulatory Visit: Payer: Self-pay | Admitting: Physician Assistant

## 2024-11-25 DIAGNOSIS — R0609 Other forms of dyspnea: Secondary | ICD-10-CM

## 2024-11-25 DIAGNOSIS — R9430 Abnormal result of cardiovascular function study, unspecified: Secondary | ICD-10-CM

## 2024-11-29 ENCOUNTER — Ambulatory Visit (HOSPITAL_BASED_OUTPATIENT_CLINIC_OR_DEPARTMENT_OTHER): Payer: Self-pay | Admitting: Family

## 2024-11-29 ENCOUNTER — Ambulatory Visit (HOSPITAL_COMMUNITY)
Admission: RE | Admit: 2024-11-29 | Discharge: 2024-11-29 | Disposition: A | Discharge status: Home / Self Care | Source: Ambulatory Visit | Attending: Cardiovascular Disease | Admitting: Cardiovascular Disease

## 2024-11-29 DIAGNOSIS — R0609 Other forms of dyspnea: Secondary | ICD-10-CM | POA: Diagnosis not present

## 2024-11-29 DIAGNOSIS — R9430 Abnormal result of cardiovascular function study, unspecified: Secondary | ICD-10-CM | POA: Insufficient documentation

## 2024-11-29 LAB — MYOCARDIAL PERFUSION IMAGING
Base ST Depression (mm): 0 mm
LV dias vol: 100 mL (ref 46–106)
LV sys vol: 20 mL
Nuc Stress EF: 80 %
Peak HR: 105 {beats}/min
Rest HR: 61 {beats}/min
Rest Nuclear Isotope Dose: 12.7 mCi
SDS: 0
SRS: 5
SSS: 1
ST Depression (mm): 0 mm
Stress Nuclear Isotope Dose: 37 mCi
TID: 1.19

## 2024-11-29 MED ORDER — REGADENOSON 0.4 MG/5ML IV SOLN
INTRAVENOUS | Status: AC
  Filled 2024-11-29: qty 5

## 2024-11-29 MED ORDER — REGADENOSON 0.4 MG/5ML IV SOLN
0.4000 mg | Freq: Once | INTRAVENOUS | Status: AC
  Administered 2024-11-29: 0.4 mg via INTRAVENOUS

## 2024-11-29 MED ORDER — TECHNETIUM TC 99M TETROFOSMIN IV KIT
37.0000 | PACK | Freq: Once | INTRAVENOUS | Status: AC | PRN
  Administered 2024-11-29: 37 via INTRAVENOUS

## 2024-11-29 MED ORDER — TECHNETIUM TC 99M TETROFOSMIN IV KIT
12.7000 | PACK | Freq: Once | INTRAVENOUS | Status: AC | PRN
  Administered 2024-11-29: 12.7 via INTRAVENOUS

## 2024-11-29 NOTE — Telephone Encounter (Signed)
"  ° °  Patient Name: Kim Watson  DOB: 1971-05-11 MRN: [id number]  Primary Cardiologist: None  Chart reviewed as part of pre-operative protocol coverage. Given past medical history and time since last visit, based on ACC/AHA guidelines, [name] is at acceptable risk for the planned procedure without further cardiovascular testing.   Seen in clinic by Raphael Bring, PA 10/04/2024.  Due to DOE and baseline abnormal EKG echo and Myoview  recommended.  Echocardiogram pulses 2/25 normal LVEF, no significant valvular abnormalities, mild dilation ascending aorta 41 mm which we monitored with repeat echocardiogram in 1 year.  Lexiscan  Myoview  11/29/2024 low risk study with no ischemia nor infarction, low risk study.  The patient was advised that if she develops new symptoms prior to surgery to contact our office to arrange for a follow-up visit, and she verbalized understanding.  I will route this recommendation to the requesting party via Epic fax function and remove from pre-op pool.  Please call with questions.  Reche GORMAN Finder, NP 11/29/2024, 2:26 PM  "

## 2024-11-30 ENCOUNTER — Ambulatory Visit (HOSPITAL_BASED_OUTPATIENT_CLINIC_OR_DEPARTMENT_OTHER): Payer: Self-pay | Admitting: Family

## 2024-12-05 NOTE — Progress Notes (Deleted)
" ° °  Cardiology Office Note    Date:  12/05/2024  ID:  Kim Watson, DOB 1971-04-20, MRN [id number] PCP:  Kim Watson LABOR, NP  Cardiologist:  None Kim Kenedy Haisley PA-C/[facility]) Electrophysiologist:  None   Chief Complaint: ***  History of Present Illness: .    Kim Watson is a 54 y.o. female with visit-pertinent history of morbid obesity, suspected OSA pending sleep study, LAFB by EKG, mild dilation of ascending aorta, chronic constipation, spondylolisthesis, prior lumbar surgery, anxiety, arthritis, HTN, dyslipidemia by labs (followed by primary care) seen for follow-up of cardiac testing.  She was recently seen for pre-operative evaluation in preparation for bariatric revision surgery through [facility]. Preop EKG by PCP done 09/12/24 showed NSR 75bpm with LAFB, poor R wave progression, nonspecific STTW changes, questionable prior anterolateral infarct versus related to body habitus. EKG here appeared similar. At OV here she reported mild chronic DOE but no accelerating angina. Baseline 2D echo showed EF 60-65%, mild dilation of ascending aorta. Insurance would not cover cardiac PET therefore nuclear stress performed showing no evidence of ischemia. Coronary calcium was absent on CT images.   Preop sent by [name]  Preoperative evaluation/mild DOE LAFB Essential HTN Mild dilation of ascending aorta  Labwork independently reviewed: 10/2024 CBC wnl, K 4.6, Cr 0.74  05/2024 Mg 2.1, TSH OK, LP(a) 85, LDL 133, trig 191, CMET OK except ALT 41/chronically mildly elevated; K 4.3, Hgb 15, plt OK   ROS: .    Please see the history of present illness. Otherwise, review of systems is positive for ***.  All other systems are reviewed and otherwise negative.  Studies Reviewed: SABRA    EKG:  EKG is ordered today, personally reviewed, demonstrating ***  CV Studies: Cardiac studies reviewed are outlined and summarized above. Otherwise please see EMR for full report.   Current Reported  Medications:.    Active Medications[1]  Physical Exam:    VS:  LMP  (LMP Unknown)    Wt Readings from Last 3 Encounters:  11/29/24 255 lb (115.7 kg)  11/22/24 252 lb 9.6 oz (114.6 kg)  10/25/24 255 lb (115.7 kg)    GEN: Well nourished, well developed in no acute distress NECK: No JVD; No carotid bruits CARDIAC: ***RRR, no murmurs, rubs, gallops RESPIRATORY:  Clear to auscultation without rales, wheezing or rhonchi  ABDOMEN: Soft, non-tender, non-distended EXTREMITIES:  No edema; No acute deformity   Asessement and Plan:.     ***     Disposition: F/u with ***  Signed, Kim Ballon Watson Rebecca Cairns, PA-C      [1]  No outpatient medications have been marked as taking for the 12/06/24 encounter (Appointment) with Kim Laidlaw N, PA-C.   "

## 2024-12-06 ENCOUNTER — Ambulatory Visit: Attending: Physician Assistant | Admitting: Physician Assistant

## 2024-12-06 DIAGNOSIS — I444 Left anterior fascicular block: Secondary | ICD-10-CM

## 2024-12-06 DIAGNOSIS — E785 Hyperlipidemia, unspecified: Secondary | ICD-10-CM

## 2024-12-06 DIAGNOSIS — I10 Essential (primary) hypertension: Secondary | ICD-10-CM

## 2024-12-06 DIAGNOSIS — I77810 Thoracic aortic ectasia: Secondary | ICD-10-CM

## 2024-12-06 DIAGNOSIS — Z01810 Encounter for preprocedural cardiovascular examination: Secondary | ICD-10-CM

## 2024-12-16 ENCOUNTER — Ambulatory Visit: Admitting: Nurse Practitioner

## 2024-12-19 ENCOUNTER — Ambulatory Visit (INDEPENDENT_AMBULATORY_CARE_PROVIDER_SITE_OTHER): Admitting: Neurology

## 2024-12-19 DIAGNOSIS — R351 Nocturia: Secondary | ICD-10-CM

## 2024-12-19 DIAGNOSIS — G4733 Obstructive sleep apnea (adult) (pediatric): Secondary | ICD-10-CM | POA: Diagnosis not present

## 2024-12-19 DIAGNOSIS — Z9884 Bariatric surgery status: Secondary | ICD-10-CM

## 2024-12-19 DIAGNOSIS — G4720 Circadian rhythm sleep disorder, unspecified type: Secondary | ICD-10-CM

## 2024-12-19 DIAGNOSIS — G4719 Other hypersomnia: Secondary | ICD-10-CM

## 2024-12-19 DIAGNOSIS — Z820 Family history of epilepsy and other diseases of the nervous system: Secondary | ICD-10-CM

## 2024-12-19 DIAGNOSIS — R519 Headache, unspecified: Secondary | ICD-10-CM

## 2024-12-20 ENCOUNTER — Ambulatory Visit: Payer: Self-pay | Admitting: Neurology

## 2024-12-20 DIAGNOSIS — G4719 Other hypersomnia: Secondary | ICD-10-CM

## 2024-12-20 DIAGNOSIS — G4733 Obstructive sleep apnea (adult) (pediatric): Secondary | ICD-10-CM

## 2024-12-20 NOTE — Procedures (Signed)
 Physician Interpretation: Please see link under Procedure Tab or under Encounters tab for physician report, technical report, as well as O2 titration and/or PAP titration tables (if applicable).

## 2024-12-22 ENCOUNTER — Encounter: Payer: Self-pay | Admitting: Plastic Surgery

## 2024-12-22 NOTE — Telephone Encounter (Signed)
-----   Message from True Mar, MD sent at 12/20/2024  4:33 PM EST ----- Patient referred by PCP, seen by me on 10/06/24, diagnostic PSG on 12/19/24.    Please call and notify the patient that the recent sleep study did confirm the diagnosis of obstructive sleep apnea. OSA is overall mild, but worth treating to see if she feels better after  treatment. To that end I recommend treatment for this in the form of autoPAP, which means, that we don't have to bring her back for a second sleep study with CPAP, but will let him try an autoPAP  machine at home, through a DME company (of her choice, or as per insurance requirement). The DME representative will educate her on how to use the machine, how to put the mask on, etc. I have placed  an order in the chart. Please send referral, talk to patient, send report to referring MD. We will need a FU in sleep clinic for 10 weeks post-PAP set up, please arrange that with me or one of our  NPs. Thanks,   True Mar, MD, PhD [facility])

## 2024-12-22 NOTE — Telephone Encounter (Signed)
 Spoke to patient gave sleep study results Pt chose [employer] as DME Gave pt [employer] # Pt aware of insurance compliance Gave pt initial f/u visit with Megan,NP 03/2025 sent orders to [facility] and forward sleep study to PCP this afternoon Pt expressed understanding and thanked me for calling

## 2024-12-30 ENCOUNTER — Encounter: Payer: Self-pay | Admitting: Family Medicine

## 2024-12-30 ENCOUNTER — Ambulatory Visit (INDEPENDENT_AMBULATORY_CARE_PROVIDER_SITE_OTHER): Admitting: Family Medicine

## 2024-12-30 VITALS — BP 118/78 | HR 93 | Temp 98.3°F | Ht 67.0 in | Wt 260.2 lb

## 2024-12-30 DIAGNOSIS — J069 Acute upper respiratory infection, unspecified: Secondary | ICD-10-CM

## 2024-12-30 DIAGNOSIS — H6993 Unspecified Eustachian tube disorder, bilateral: Secondary | ICD-10-CM

## 2024-12-30 MED ORDER — PREDNISONE 20 MG PO TABS: 20.0000 mg | ORAL_TABLET | Freq: Two times a day (BID) | ORAL | 0 refills | Status: AC

## 2024-12-30 MED ORDER — BENZONATATE 200 MG PO CAPS: 200.0000 mg | ORAL_CAPSULE | Freq: Two times a day (BID) | ORAL | 0 refills | Status: AC | PRN

## 2024-12-30 NOTE — Progress Notes (Signed)
 "  Established Patient Office Visit   Subjective:  Patient ID: Kim Watson, female    DOB: 10-17-1971  Age: 54 y.o. MRN: [id number]  Chief Complaint  Patient presents with   Acute Visit    Cough, headache, burning in chest, green mucus, previous runny nose, ears feel full onset Wednesday.     HPI Encounter Diagnoses  Name Primary?   Viral upper respiratory tract infection Yes   Dysfunction of both eustachian tubes    2-day history of malaise, headache, nasal congestion with postnasal drip, and cough.  No fevers chills, difficulty breathing or wheezing, no asthma history, or sore throat.   Review of Systems  Constitutional:  Positive for malaise/fatigue. Negative for chills and fever.  HENT:  Positive for congestion. Negative for sinus pain and sore throat.   Eyes:  Negative for blurred vision, discharge and redness.  Respiratory:  Positive for cough. Negative for shortness of breath and wheezing.   Cardiovascular: Negative.   Gastrointestinal:  Negative for abdominal pain.  Genitourinary: Negative.   Musculoskeletal: Negative.  Negative for joint pain and myalgias.  Skin:  Negative for rash.  Neurological:  Positive for headaches. Negative for tingling, loss of consciousness and weakness.  Endo/Heme/Allergies:  Negative for polydipsia.    Current Medications[1]   Objective:     BP 118/78   Pulse 93   Temp 98.3 F (36.8 C)   Ht 5' 7 (1.702 m)   Wt 260 lb 3.2 oz (118 kg)   LMP  (LMP Unknown)   SpO2 99%   BMI 40.75 kg/m  Wt Readings from Last 3 Encounters:  12/30/24 260 lb 3.2 oz (118 kg)  11/29/24 255 lb (115.7 kg)  11/22/24 252 lb 9.6 oz (114.6 kg)      Physical Exam Constitutional:      General: She is not in acute distress.    Appearance: Normal appearance. She is not ill-appearing, toxic-appearing or diaphoretic.  HENT:     Head: Normocephalic and atraumatic.     Right Ear: External ear normal. No middle ear effusion. Tympanic membrane is retracted.  Tympanic membrane is not erythematous.     Left Ear: External ear normal.  No middle ear effusion. Tympanic membrane is retracted. Tympanic membrane is not erythematous.     Mouth/Throat:     Mouth: Mucous membranes are moist.     Pharynx: Oropharynx is clear. No oropharyngeal exudate or posterior oropharyngeal erythema.  Eyes:     General: No scleral icterus.       Right eye: No discharge.        Left eye: No discharge.     Extraocular Movements: Extraocular movements intact.     Conjunctiva/sclera: Conjunctivae normal.     Pupils: Pupils are equal, round, and reactive to light.  Cardiovascular:     Rate and Rhythm: Normal rate and regular rhythm.  Pulmonary:     Effort: Pulmonary effort is normal. No respiratory distress.     Breath sounds: Normal breath sounds. No wheezing, rhonchi or rales.  Abdominal:     General: Bowel sounds are normal.  Musculoskeletal:     Cervical back: No rigidity or tenderness.  Lymphadenopathy:     Cervical: No cervical adenopathy.  Skin:    General: Skin is warm and dry.  Neurological:     Mental Status: She is alert and oriented to person, place, and time.  Psychiatric:        Mood and Affect: Mood normal.  Behavior: Behavior normal.      No results found for any visits on 12/30/24.  Return in  The 10-year ASCVD risk score (Arnett DK, et al., 2019) is: 2.3%    Assessment & Plan:   Viral upper respiratory tract infection -     Benzonatate ; Take 1 capsule (200 mg total) by mouth 2 (two) times daily as needed for cough.  Dispense: 20 capsule; Refill: 0  Dysfunction of both eustachian tubes -     predniSONE ; Take 1 tablet (20 mg total) by mouth 2 (two) times daily with a meal for 7 days.  Dispense: 14 tablet; Refill: 0    Return Return in 1 week if not improving.  Information given on bilateral respiratory illness and ETD.  Suggested ETD exercises for few days on the prednisone .  Follow-up in a week if not improving or sooner if  worse.  Elsie Sim Lent, MD    [1]  Current Outpatient Medications:    benzonatate  (TESSALON ) 200 MG capsule, Take 1 capsule (200 mg total) by mouth 2 (two) times daily as needed for cough., Disp: 20 capsule, Rfl: 0   clonazePAM  (KLONOPIN ) 0.5 MG disintegrating tablet, Take 1 tablet (0.5 mg total) by mouth 2 (two) times daily as needed for seizure. DISSOLVE 1 TABLET(0.5 MG) ON THE TONGUE TWICE DAILY, Disp: 30 tablet, Rfl: 1   cloNIDine  (CATAPRES  - DOSED IN MG/24 HR) 0.1 mg/24hr patch, APPLY 1 PATCH(0.1 MG) TOPICALLY TO THE SKIN 1 TIME A WEEK, Disp: 4 patch, Rfl: 1   clotrimazole -betamethasone  (LOTRISONE ) cream, Apply 1 Application topically 2 (two) times daily as needed (rash)., Disp: 45 g, Rfl: 1   cyclobenzaprine  (FLEXERIL ) 10 MG tablet, Take 1 tablet (10 mg total) by mouth 3 (three) times daily as needed for muscle spasms., Disp: 90 tablet, Rfl: 0   DULoxetine  (CYMBALTA ) 60 MG capsule, TAKE 1 CAPSULE(60 MG) BY MOUTH DAILY, Disp: 90 capsule, Rfl: 0   fluticasone  (FLONASE ) 50 MCG/ACT nasal spray, Place 2 sprays into both nostrils daily., Disp: 16 g, Rfl: 6   gabapentin  (NEURONTIN ) 300 MG capsule, Take 2 capsules (600 mg total) by mouth 3 (three) times daily., Disp: 90 capsule, Rfl: 2   nystatin  powder, Apply 1 Application topically 3 (three) times daily. As needed, Disp: 60 g, Rfl: 0   pantoprazole  (PROTONIX ) 40 MG tablet, TAKE 1 TABLET(40 MG) BY MOUTH DAILY, Disp: 90 tablet, Rfl: 1   phentermine (ADIPEX-P) 37.5 MG tablet, Take 37.5 mg by mouth., Disp: , Rfl:    predniSONE  (DELTASONE ) 20 MG tablet, Take 1 tablet (20 mg total) by mouth 2 (two) times daily with a meal for 7 days., Disp: 14 tablet, Rfl: 0   valACYclovir  (VALTREX ) 500 MG tablet, Take 2 tablets twice a day for 7 days, then take 1 tablet daily, Disp: 90 tablet, Rfl: 1  "

## 2025-01-05 ENCOUNTER — Ambulatory Visit: Admitting: Family Medicine

## 2025-01-05 ENCOUNTER — Other Ambulatory Visit: Payer: Self-pay | Admitting: Nurse Practitioner

## 2025-01-05 DIAGNOSIS — F419 Anxiety disorder, unspecified: Secondary | ICD-10-CM

## 2025-01-05 NOTE — Telephone Encounter (Signed)
 Requesting: PANTOPRAZOLE  40MG  TABLETS  Last Visit: 09/12/2024 Next Visit: Visit date not found Last Refill: 10/20/2024  Please Advise

## 2025-03-20 ENCOUNTER — Encounter: Admitting: Adult Health

## 2025-04-04 ENCOUNTER — Ambulatory Visit: Admitting: Neurosurgery
